# Patient Record
Sex: Female | Born: 1970 | Race: White | Hispanic: No | Marital: Married | Smoking: Never smoker
Health system: Southern US, Community
[De-identification: ages and names within clinical notes are randomized; demographics above are authoritative.]

## PROBLEM LIST (undated history)

## (undated) DIAGNOSIS — U071 COVID-19: Secondary | ICD-10-CM

## (undated) DIAGNOSIS — R51 Headache: Secondary | ICD-10-CM

## (undated) DIAGNOSIS — J45909 Unspecified asthma, uncomplicated: Secondary | ICD-10-CM

## (undated) DIAGNOSIS — M549 Dorsalgia, unspecified: Secondary | ICD-10-CM

## (undated) DIAGNOSIS — N946 Dysmenorrhea, unspecified: Secondary | ICD-10-CM

## (undated) DIAGNOSIS — T7840XA Allergy, unspecified, initial encounter: Secondary | ICD-10-CM

## (undated) DIAGNOSIS — E538 Deficiency of other specified B group vitamins: Secondary | ICD-10-CM

## (undated) DIAGNOSIS — K589 Irritable bowel syndrome without diarrhea: Secondary | ICD-10-CM

## (undated) DIAGNOSIS — D649 Anemia, unspecified: Secondary | ICD-10-CM

## (undated) DIAGNOSIS — E559 Vitamin D deficiency, unspecified: Secondary | ICD-10-CM

## (undated) DIAGNOSIS — M722 Plantar fascial fibromatosis: Secondary | ICD-10-CM

## (undated) DIAGNOSIS — F419 Anxiety disorder, unspecified: Secondary | ICD-10-CM

## (undated) DIAGNOSIS — M1990 Unspecified osteoarthritis, unspecified site: Secondary | ICD-10-CM

## (undated) DIAGNOSIS — K5900 Constipation, unspecified: Secondary | ICD-10-CM

## (undated) DIAGNOSIS — F32A Depression, unspecified: Secondary | ICD-10-CM

## (undated) DIAGNOSIS — M791 Myalgia, unspecified site: Secondary | ICD-10-CM

## (undated) DIAGNOSIS — M940 Chondrocostal junction syndrome [Tietze]: Secondary | ICD-10-CM

## (undated) DIAGNOSIS — R600 Localized edema: Secondary | ICD-10-CM

## (undated) DIAGNOSIS — R0602 Shortness of breath: Secondary | ICD-10-CM

## (undated) DIAGNOSIS — R002 Palpitations: Secondary | ICD-10-CM

## (undated) DIAGNOSIS — R519 Headache, unspecified: Secondary | ICD-10-CM

## (undated) DIAGNOSIS — E039 Hypothyroidism, unspecified: Secondary | ICD-10-CM

## (undated) DIAGNOSIS — S63519A Sprain of carpal joint of unspecified wrist, initial encounter: Secondary | ICD-10-CM

## (undated) DIAGNOSIS — K219 Gastro-esophageal reflux disease without esophagitis: Secondary | ICD-10-CM

## (undated) DIAGNOSIS — L309 Dermatitis, unspecified: Secondary | ICD-10-CM

## (undated) DIAGNOSIS — C801 Malignant (primary) neoplasm, unspecified: Secondary | ICD-10-CM

## (undated) DIAGNOSIS — F329 Major depressive disorder, single episode, unspecified: Secondary | ICD-10-CM

## (undated) HISTORY — PX: OTHER SURGICAL HISTORY: SHX169

## (undated) HISTORY — DX: Myalgia, unspecified site: M79.10

## (undated) HISTORY — DX: Anxiety disorder, unspecified: F41.9

## (undated) HISTORY — DX: Vitamin D deficiency, unspecified: E55.9

## (undated) HISTORY — DX: Chondrocostal junction syndrome (tietze): M94.0

## (undated) HISTORY — DX: Allergy, unspecified, initial encounter: T78.40XA

## (undated) HISTORY — DX: Gastro-esophageal reflux disease without esophagitis: K21.9

## (undated) HISTORY — DX: Constipation, unspecified: K59.00

## (undated) HISTORY — DX: Palpitations: R00.2

## (undated) HISTORY — PX: TONSILLECTOMY: SUR1361

## (undated) HISTORY — DX: Shortness of breath: R06.02

## (undated) HISTORY — DX: Plantar fascial fibromatosis: M72.2

## (undated) HISTORY — DX: Depression, unspecified: F32.A

## (undated) HISTORY — DX: Hypothyroidism, unspecified: E03.9

## (undated) HISTORY — DX: Unspecified osteoarthritis, unspecified site: M19.90

## (undated) HISTORY — DX: Deficiency of other specified B group vitamins: E53.8

## (undated) HISTORY — DX: Dysmenorrhea, unspecified: N94.6

## (undated) HISTORY — DX: Sprain of carpal joint of unspecified wrist, initial encounter: S63.519A

## (undated) HISTORY — DX: Dorsalgia, unspecified: M54.9

## (undated) HISTORY — DX: Localized edema: R60.0

## (undated) HISTORY — DX: Dermatitis, unspecified: L30.9

## (undated) HISTORY — DX: Major depressive disorder, single episode, unspecified: F32.9

---
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
## 2004-08-08 ENCOUNTER — Ambulatory Visit: Payer: Self-pay | Admitting: Otolaryngology

## 2007-03-27 HISTORY — PX: CHOLECYSTECTOMY: SHX55

## 2008-01-09 ENCOUNTER — Ambulatory Visit: Payer: Self-pay | Admitting: General Surgery

## 2008-01-12 ENCOUNTER — Ambulatory Visit: Payer: Self-pay | Admitting: General Surgery

## 2008-01-15 ENCOUNTER — Ambulatory Visit: Payer: Self-pay | Admitting: General Surgery

## 2008-03-26 DIAGNOSIS — C801 Malignant (primary) neoplasm, unspecified: Secondary | ICD-10-CM

## 2008-03-26 HISTORY — DX: Malignant (primary) neoplasm, unspecified: C80.1

## 2008-03-26 HISTORY — PX: SKIN GRAFT: SHX250

## 2011-10-14 ENCOUNTER — Ambulatory Visit: Payer: Self-pay | Admitting: Internal Medicine

## 2011-10-18 ENCOUNTER — Ambulatory Visit: Payer: Self-pay | Admitting: Internal Medicine

## 2011-10-20 ENCOUNTER — Ambulatory Visit: Payer: Self-pay | Admitting: Internal Medicine

## 2013-07-24 LAB — HM PAP SMEAR: HM PAP: NEGATIVE

## 2013-08-14 ENCOUNTER — Ambulatory Visit: Payer: Self-pay

## 2014-08-25 ENCOUNTER — Telehealth: Payer: Self-pay

## 2014-08-25 NOTE — Telephone Encounter (Signed)
Pt advised regarding test  Result and also notify reg endocrinology referral they will notify pt with appt date and time [facility] NP

## 2014-09-08 ENCOUNTER — Telehealth: Payer: Self-pay | Admitting: Family Medicine

## 2014-09-08 NOTE — Telephone Encounter (Signed)
Pt called stating she was needing her allergy shots approval sent to [facility]/Dr. Beverly Gust.  [address] [place], [place] Location Phone: [phone]

## 2014-09-09 NOTE — Telephone Encounter (Signed)
Got approval for 6 more visit.

## 2014-09-09 NOTE — Telephone Encounter (Signed)
Pt  Called  Back  Today states  That   She   Get shots  Once a week   Wed or Thurs.   Was  Approved for  6 visit

## 2014-10-15 ENCOUNTER — Telehealth: Payer: Self-pay | Admitting: Family Medicine

## 2014-10-15 NOTE — Telephone Encounter (Signed)
Spoke with [name] they lost paperwork faxed in 08/2014 so re faxed all paperwork and they will contact pt with appointment date and time and also called and advised pt to give them call if she doesn't hear anything from them. Nisha

## 2014-10-15 NOTE — Telephone Encounter (Signed)
Pt called to check the status of referral to [facility] endocrinology.  Pleases call

## 2014-10-18 ENCOUNTER — Telehealth: Payer: Self-pay | Admitting: Family Medicine

## 2014-10-18 NOTE — Telephone Encounter (Signed)
Barbara from Dr. Joycie Peek office at [facility] needs to know the reason for the referral to endocrinology.  Please call [phone]

## 2014-10-18 NOTE — Telephone Encounter (Signed)
The reason given to [facility]

## 2014-11-25 ENCOUNTER — Telehealth: Payer: Self-pay | Admitting: Family Medicine

## 2014-11-25 NOTE — Telephone Encounter (Signed)
Referral number is [id number] done for this month.

## 2014-11-25 NOTE — Telephone Encounter (Signed)
Pt called states that she need approval for her Allergy  Shots  Pt have  [place]. Pt  Call back # is  [phone]

## 2014-12-09 ENCOUNTER — Other Ambulatory Visit: Payer: Self-pay | Admitting: Internal Medicine

## 2014-12-09 DIAGNOSIS — E038 Other specified hypothyroidism: Secondary | ICD-10-CM

## 2014-12-14 ENCOUNTER — Ambulatory Visit: Payer: Self-pay

## 2014-12-14 ENCOUNTER — Other Ambulatory Visit: Payer: Self-pay

## 2014-12-15 ENCOUNTER — Other Ambulatory Visit: Payer: Self-pay

## 2014-12-20 ENCOUNTER — Encounter
Admission: RE | Admit: 2014-12-20 | Discharge: 2014-12-20 | Disposition: A | Discharge status: Home / Self Care | Payer: 59 | Source: Ambulatory Visit | Attending: Internal Medicine | Admitting: Internal Medicine

## 2014-12-20 DIAGNOSIS — E039 Hypothyroidism, unspecified: Secondary | ICD-10-CM | POA: Insufficient documentation

## 2014-12-20 DIAGNOSIS — E038 Other specified hypothyroidism: Secondary | ICD-10-CM

## 2014-12-20 MED ORDER — SODIUM IODIDE I-123 3.7 MBQ PO CAPS
150.8500 | ORAL_CAPSULE | Freq: Once | ORAL | Status: AC
  Administered 2014-12-20: 150.85 via ORAL

## 2014-12-21 ENCOUNTER — Encounter
Admission: RE | Admit: 2014-12-21 | Discharge: 2014-12-21 | Disposition: A | Discharge status: Home / Self Care | Payer: 59 | Source: Ambulatory Visit | Attending: Internal Medicine | Admitting: Internal Medicine

## 2014-12-21 DIAGNOSIS — E039 Hypothyroidism, unspecified: Secondary | ICD-10-CM | POA: Diagnosis present

## 2014-12-21 HISTORY — DX: Unspecified asthma, uncomplicated: J45.909

## 2014-12-21 HISTORY — DX: Malignant (primary) neoplasm, unspecified: C80.1

## 2014-12-29 ENCOUNTER — Other Ambulatory Visit: Payer: Self-pay | Admitting: Internal Medicine

## 2014-12-29 DIAGNOSIS — E038 Other specified hypothyroidism: Secondary | ICD-10-CM

## 2014-12-30 ENCOUNTER — Telehealth: Payer: Self-pay | Admitting: Family Medicine

## 2014-12-30 NOTE — Telephone Encounter (Signed)
Pt called requesting  Authorization for her allergy shots . Pt call back # is  [phone]

## 2014-12-30 NOTE — Telephone Encounter (Signed)
Referral has been submitted number is [id number].

## 2015-01-04 ENCOUNTER — Ambulatory Visit
Admission: RE | Admit: 2015-01-04 | Discharge: 2015-01-04 | Disposition: A | Discharge status: Home / Self Care | Payer: 59 | Source: Ambulatory Visit | Attending: Internal Medicine | Admitting: Internal Medicine

## 2015-01-04 DIAGNOSIS — G935 Compression of brain: Secondary | ICD-10-CM | POA: Diagnosis not present

## 2015-01-04 DIAGNOSIS — E038 Other specified hypothyroidism: Secondary | ICD-10-CM

## 2015-01-04 MED ORDER — GADOBENATE DIMEGLUMINE 529 MG/ML IV SOLN
10.0000 mL | Freq: Once | INTRAVENOUS | Status: AC | PRN
  Administered 2015-01-04: 10 mL via INTRAVENOUS

## 2015-02-10 ENCOUNTER — Telehealth: Payer: Self-pay | Admitting: Family Medicine

## 2015-02-10 NOTE — Telephone Encounter (Signed)
Pt said she needed authorization from [facility] to do more allergy injections at [facility] with Dr. Tami Ribas.  Her call back number is [phone]

## 2015-02-11 NOTE — Telephone Encounter (Signed)
REF # [id number]

## 2015-06-21 ENCOUNTER — Ambulatory Visit (INDEPENDENT_AMBULATORY_CARE_PROVIDER_SITE_OTHER): Payer: BLUE CROSS/BLUE SHIELD | Admitting: Family Medicine

## 2015-06-21 ENCOUNTER — Other Ambulatory Visit: Payer: Self-pay | Admitting: Family Medicine

## 2015-06-21 ENCOUNTER — Encounter: Payer: Self-pay | Admitting: Family Medicine

## 2015-06-21 VITALS — BP 126/84 | HR 75 | Temp 98.7°F | Resp 16 | Ht 69.0 in | Wt 252.0 lb

## 2015-06-21 DIAGNOSIS — Z01419 Encounter for gynecological examination (general) (routine) without abnormal findings: Secondary | ICD-10-CM

## 2015-06-21 DIAGNOSIS — N926 Irregular menstruation, unspecified: Secondary | ICD-10-CM

## 2015-06-21 DIAGNOSIS — E038 Other specified hypothyroidism: Secondary | ICD-10-CM | POA: Insufficient documentation

## 2015-06-21 DIAGNOSIS — E669 Obesity, unspecified: Secondary | ICD-10-CM | POA: Diagnosis not present

## 2015-06-21 DIAGNOSIS — J301 Allergic rhinitis due to pollen: Secondary | ICD-10-CM

## 2015-06-21 DIAGNOSIS — J309 Allergic rhinitis, unspecified: Secondary | ICD-10-CM | POA: Insufficient documentation

## 2015-06-21 LAB — POCT URINE PREGNANCY: PREG TEST UR: NEGATIVE

## 2015-06-21 MED ORDER — NORETHINDRONE 0.35 MG PO TABS: 1.0000 | ORAL_TABLET | Freq: Every day | ORAL | Status: DC

## 2015-06-21 MED ORDER — FLUTICASONE PROPIONATE 50 MCG/ACT NA SUSP: 2.0000 | Freq: Every day | NASAL | Status: DC

## 2015-06-21 MED ORDER — PSEUDOEPHEDRINE HCL 60 MG PO TABS: 60.0000 mg | ORAL_TABLET | Freq: Three times a day (TID) | ORAL | Status: DC | PRN

## 2015-06-21 NOTE — Assessment & Plan Note (Signed)
Encouraged healthy eating and continued exercise to help get to a healthy weight.

## 2015-06-21 NOTE — Progress Notes (Signed)
Subjective:    Patient ID: Caitlin Barrett, female    DOB: Nov 17, 1970, 45 y.o.   MRN: [id number]  HPI: Caitlin Barrett is a 45 y.o. female presenting on 06/21/2015 for Annual Exam   HPI  Pt presents for well woman exam. Recently found to have hypothyroidism. Treated at [facility]. Last pap 2015- normal. Had 1 ASCUS pap- did a colpo- no biposy. All normal since that time. Some clear vaginal discharge. Has not had her cycle. Previous cycles 21-24days had been abnormal with change in thyroid medication. Taking micronor- has not missed any days. No pain during sex.  Had baseline mammogram last year- normal. Some BSE- no lumps bumps or changes.   Some sinus issues- some sinus HA. Having HA and nasal congestion. Has allergies. No using nasonex or flonase   Past Medical History  Diagnosis Date  . Asthma     exercised enduced  . Cancer (HCC) 2010    Basal cell skin cancer Lt eye, skin graft above Lt eye  . Hypothyroid   . Allergy    Family History  Problem Relation Age of Onset  . Hyperlipidemia Mother   . Hypertension Mother   . Cancer Father   . Heart disease Maternal Grandmother   . Heart disease Maternal Grandfather    Social History   Social History  . Marital Status: Married    Spouse Name: N/A  . Number of Children: N/A  . Years of Education: N/A   Occupational History  . Not on file.   Social History Main Topics  . Smoking status: Never Smoker   . Smokeless tobacco: Not on file  . Alcohol Use: Yes  . Drug Use: No  . Sexual Activity: Not on file   Other Topics Concern  . Not on file   Social History Narrative   Family History  Problem Relation Age of Onset  . Hyperlipidemia Mother   . Hypertension Mother   . Cancer Father   . Heart disease Maternal Grandmother   . Heart disease Maternal Grandfather    No current outpatient prescriptions on file prior to visit.   No current facility-administered medications on file prior to visit.     Review of Systems  Constitutional: Negative for fever and chills.  HENT: Positive for congestion, postnasal drip and rhinorrhea.   Respiratory: Negative for cough, chest tightness and wheezing.   Cardiovascular: Negative for chest pain and leg swelling.  Gastrointestinal: Negative for nausea, vomiting, abdominal pain, diarrhea and constipation.  Endocrine: Negative.  Negative for cold intolerance, heat intolerance, polydipsia, polyphagia and polyuria.  Genitourinary: Positive for menstrual problem (missed period). Negative for dysuria, vaginal bleeding, vaginal discharge, difficulty urinating, vaginal pain and pelvic pain.  Musculoskeletal: Negative.   Neurological: Positive for headaches. Negative for dizziness, light-headedness and numbness.  Psychiatric/Behavioral: Negative.    Per HPI unless specifically indicated above     Objective:    BP 126/84 mmHg  Pulse 75  Temp(Src) 98.7 F (37.1 C) (Oral)  Resp 16  Ht 5\' 9"  (1.753 m)  Wt 252 lb (114.306 kg)  BMI 37.20 kg/m2  Wt Readings from Last 3 Encounters:  06/21/15 252 lb (114.306 kg)    Depression screen PHQ 2/9 06/21/2015  Decreased Interest 0  Down, Depressed, Hopeless 0  PHQ - 2 Score 0     Physical Exam  Constitutional: She is oriented to person, place, and time. She appears well-developed and well-nourished.  HENT:  Head: Normocephalic and atraumatic.  Right Ear:  Hearing and tympanic membrane normal.  Left Ear: Hearing normal. Tympanic membrane is scarred.  Mouth/Throat: Oropharynx is clear and moist and mucous membranes are normal.  Boggy nasal turbinates  Neck: Neck supple. No thyromegaly present.  Cardiovascular: Normal rate, regular rhythm and normal heart sounds.  Exam reveals no gallop and no friction rub.   No murmur heard. Pulmonary/Chest: Effort normal and breath sounds normal. She has no wheezes. She has no rhonchi. She has no rales. She exhibits no tenderness. Right breast exhibits no inverted  nipple, no mass, no nipple discharge, no skin change and no tenderness. Left breast exhibits no inverted nipple, no mass, no nipple discharge, no skin change and no tenderness.  Abdominal: Soft. Normal appearance and bowel sounds are normal. She exhibits no distension and no mass. There is no tenderness. There is no rebound and no guarding.  Genitourinary: Vagina normal and uterus normal. No breast swelling, tenderness, discharge or bleeding. There is no rash, lesion or injury on the right labia. There is no rash, lesion or injury on the left labia. Cervix exhibits no motion tenderness. Right adnexum displays no mass, no tenderness and no fullness. Left adnexum displays no mass, no tenderness and no fullness. No erythema or tenderness in the vagina. No vaginal discharge found.  Musculoskeletal: Normal range of motion. She exhibits no edema or tenderness.  Lymphadenopathy:    She has no cervical adenopathy.  Neurological: She is alert and oriented to person, place, and time.  Skin: Skin is warm and dry.   Results for orders placed or performed in visit on 06/21/15  HM PAP SMEAR  Result Value Ref Range   HM Pap smear negative   POCT urine pregnancy  Result Value Ref Range   Preg Test, Ur Negative Negative      Assessment & Plan:   Problem List Items Addressed This Visit      Respiratory   Allergic rhinitis    Start daily flonase in addition to allergy pill. Trial of sudafed to help with sinus symptoms. Encouraged saline rinses to help with allergy symptoms.       Relevant Medications   fluticasone (FLONASE) 50 MCG/ACT nasal spray   pseudoephedrine (SUDAFED) 60 MG tablet     Other   Obesity (BMI 30-39.9)    Encouraged healthy eating and continued exercise to help get to a healthy weight.        Other Visit Diagnoses    Well woman exam with routine gynecological exam    -  Primary    Next pap due 2018. Declines mammo this year.     Missed period        Likley irregular due to  progesterone only pill. Negative urine pregnancy. Pt will follow-up if she continues not to cycle.     Relevant Orders    POCT urine pregnancy (Completed)       Meds ordered this encounter  Medications  . EPINEPHrine 0.3 mg/0.3 mL IJ SOAJ injection    Sig:   . fluticasone (FLONASE) 50 MCG/ACT nasal spray    Sig: Place 2 sprays into both nostrils daily.    Dispense:  16 g    Refill:  11    Order Specific Question:  Supervising Provider    Answer:  Janeann Forehand [phone]  . pseudoephedrine (SUDAFED) 60 MG tablet    Sig: Take 1 tablet (60 mg total) by mouth every 8 (eight) hours as needed for congestion.    Dispense:  30 tablet  Refill:  0    Order Specific Question:  Supervising Provider    Answer:  Janeann Forehand [[id number]]      Follow up plan: No Follow-up on file.

## 2015-06-21 NOTE — Assessment & Plan Note (Signed)
Start daily flonase in addition to allergy pill. Trial of sudafed to help with sinus symptoms. Encouraged saline rinses to help with allergy symptoms.

## 2015-06-21 NOTE — Patient Instructions (Addendum)
Try flonase and sudafed for sinus symptoms x2 days, if not improving, please let me know.   Health Maintenance, Female Adopting a healthy lifestyle and getting preventive care can go a long way to promote health and wellness. Talk with your health care provider about what schedule of regular examinations is right for you. This is a good chance for you to check in with your provider about disease prevention and staying healthy. In between checkups, there are plenty of things you can do on your own. Experts have done a lot of research about which lifestyle changes and preventive measures are most likely to keep you healthy. Ask your health care provider for more information. WEIGHT AND DIET  Eat a healthy diet  Be sure to include plenty of vegetables, fruits, low-fat dairy products, and lean protein.  Do not eat a lot of foods high in solid fats, added sugars, or salt.  Get regular exercise. This is one of the most important things you can do for your health.  Most adults should exercise for at least 150 minutes each week. The exercise should increase your heart rate and make you sweat (moderate-intensity exercise).  Most adults should also do strengthening exercises at least twice a week. This is in addition to the moderate-intensity exercise.  Maintain a healthy weight  Body mass index (BMI) is a measurement that can be used to identify possible weight problems. It estimates body fat based on height and weight. Your health care provider can help determine your BMI and help you achieve or maintain a healthy weight.  For females 77 years of age and older:   A BMI below 18.5 is considered underweight.  A BMI of 18.5 to 24.9 is normal.  A BMI of 25 to 29.9 is considered overweight.  A BMI of 30 and above is considered obese.  Watch levels of cholesterol and blood lipids  You should start having your blood tested for lipids and cholesterol at 43 years of age, then have this test every 5  years.  You may need to have your cholesterol levels checked more often if:  Your lipid or cholesterol levels are high.  You are older than 43 years of age.  You are at high risk for heart disease.  CANCER SCREENING   Lung Cancer  Lung cancer screening is recommended for adults 10-88 years old who are at high risk for lung cancer because of a history of smoking.  A yearly low-dose CT scan of the lungs is recommended for people who:  Currently smoke.  Have quit within the past 15 years.  Have at least a 30-pack-year history of smoking. A pack year is smoking an average of one pack of cigarettes a day for 1 year.  Yearly screening should continue until it has been 15 years since you quit.  Yearly screening should stop if you develop a health problem that would prevent you from having lung cancer treatment.  Breast Cancer  Practice breast self-awareness. This means understanding how your breasts normally appear and feel.  It also means doing regular breast self-exams. Let your health care provider know about any changes, no matter how small.  If you are in your 20s or 30s, you should have a clinical breast exam (CBE) by a health care provider every 1-3 years as part of a regular health exam.  If you are 77 or older, have a CBE every year. Also consider having a breast X-ray (mammogram) every year.  If you have  a family history of breast cancer, talk to your health care provider about genetic screening.  If you are at high risk for breast cancer, talk to your health care provider about having an MRI and a mammogram every year.  Breast cancer gene (BRCA) assessment is recommended for women who have family members with BRCA-related cancers. BRCA-related cancers include:  Breast.  Ovarian.  Tubal.  Peritoneal cancers.  Results of the assessment will determine the need for genetic counseling and BRCA1 and BRCA2 testing. Cervical Cancer Your health care provider may  recommend that you be screened regularly for cancer of the pelvic organs (ovaries, uterus, and vagina). This screening involves a pelvic examination, including checking for microscopic changes to the surface of your cervix (Pap test). You may be encouraged to have this screening done every 3 years, beginning at age 21.  For women ages 30-65, health care providers may recommend pelvic exams and Pap testing every 3 years, or they may recommend the Pap and pelvic exam, combined with testing for human papilloma virus (HPV), every 5 years. Some types of HPV increase your risk of cervical cancer. Testing for HPV may also be done on women of any age with unclear Pap test results.  Other health care providers may not recommend any screening for nonpregnant women who are considered low risk for pelvic cancer and who do not have symptoms. Ask your health care provider if a screening pelvic exam is right for you.  If you have had past treatment for cervical cancer or a condition that could lead to cancer, you need Pap tests and screening for cancer for at least 20 years after your treatment. If Pap tests have been discontinued, your risk factors (such as having a new sexual partner) need to be reassessed to determine if screening should resume. Some women have medical problems that increase the chance of getting cervical cancer. In these cases, your health care provider may recommend more frequent screening and Pap tests. Colorectal Cancer  This type of cancer can be detected and often prevented.  Routine colorectal cancer screening usually begins at 43 years of age and continues through 43 years of age.  Your health care provider may recommend screening at an earlier age if you have risk factors for colon cancer.  Your health care provider may also recommend using home test kits to check for hidden blood in the stool.  A small camera at the end of a tube can be used to examine your colon directly  (sigmoidoscopy or colonoscopy). This is done to check for the earliest forms of colorectal cancer.  Routine screening usually begins at age 50.  Direct examination of the colon should be repeated every 5-10 years through 43 years of age. However, you may need to be screened more often if early forms of precancerous polyps or small growths are found. Skin Cancer  Check your skin from head to toe regularly.  Tell your health care provider about any new moles or changes in moles, especially if there is a change in a mole's shape or color.  Also tell your health care provider if you have a mole that is larger than the size of a pencil eraser.  Always use sunscreen. Apply sunscreen liberally and repeatedly throughout the day.  Protect yourself by wearing long sleeves, pants, a wide-brimmed hat, and sunglasses whenever you are outside. HEART DISEASE, DIABETES, AND HIGH BLOOD PRESSURE   High blood pressure causes heart disease and increases the risk of stroke. High   blood pressure is more likely to develop in:  People who have blood pressure in the high end of the normal range (130-139/85-89 mm Hg).  People who are overweight or obese.  People who are African American.  If you are 18-39 years of age, have your blood pressure checked every 3-5 years. If you are 40 years of age or older, have your blood pressure checked every year. You should have your blood pressure measured twice--once when you are at a hospital or clinic, and once when you are not at a hospital or clinic. Record the average of the two measurements. To check your blood pressure when you are not at a hospital or clinic, you can use:  An automated blood pressure machine at a pharmacy.  A home blood pressure monitor.  If you are between 55 years and 79 years old, ask your health care provider if you should take aspirin to prevent strokes.  Have regular diabetes screenings. This involves taking a blood sample to check your  fasting blood sugar level.  If you are at a normal weight and have a low risk for diabetes, have this test once every three years after 43 years of age.  If you are overweight and have a high risk for diabetes, consider being tested at a younger age or more often. PREVENTING INFECTION  Hepatitis B  If you have a higher risk for hepatitis B, you should be screened for this virus. You are considered at high risk for hepatitis B if:  You were born in a country where hepatitis B is common. Ask your health care provider which countries are considered high risk.  Your parents were born in a high-risk country, and you have not been immunized against hepatitis B (hepatitis B vaccine).  You have HIV or AIDS.  You use needles to inject street drugs.  You live with someone who has hepatitis B.  You have had sex with someone who has hepatitis B.  You get hemodialysis treatment.  You take certain medicines for conditions, including cancer, organ transplantation, and autoimmune conditions. Hepatitis C  Blood testing is recommended for:  Everyone born from 1945 through 1965.  Anyone with known risk factors for hepatitis C. Sexually transmitted infections (STIs)  You should be screened for sexually transmitted infections (STIs) including gonorrhea and chlamydia if:  You are sexually active and are younger than 43 years of age.  You are older than 43 years of age and your health care provider tells you that you are at risk for this type of infection.  Your sexual activity has changed since you were last screened and you are at an increased risk for chlamydia or gonorrhea. Ask your health care provider if you are at risk.  If you do not have HIV, but are at risk, it may be recommended that you take a prescription medicine daily to prevent HIV infection. This is called pre-exposure prophylaxis (PrEP). You are considered at risk if:  You are sexually active and do not regularly use condoms or  know the HIV status of your partner(s).  You take drugs by injection.  You are sexually active with a partner who has HIV. Talk with your health care provider about whether you are at high risk of being infected with HIV. If you choose to begin PrEP, you should first be tested for HIV. You should then be tested every 3 months for as long as you are taking PrEP.  PREGNANCY   If you are   If you are premenopausal and you may become pregnant, ask your health care provider about preconception counseling.  If you may become pregnant, take 400 to 800 micrograms (mcg) of folic acid every day.  If you want to prevent pregnancy, talk to your health care provider about birth control (contraception). OSTEOPOROSIS AND MENOPAUSE   Osteoporosis is a disease in which the bones lose minerals and strength with aging. This can result in serious bone fractures. Your risk for osteoporosis can be identified using a bone density scan.  If you are 65 years of age or older, or if you are at risk for osteoporosis and fractures, ask your health care provider if you should be screened.  Ask your health care provider whether you should take a calcium or vitamin D supplement to lower your risk for osteoporosis.  Menopause may have certain physical symptoms and risks.  Hormone replacement therapy may reduce some of these symptoms and risks. Talk to your health care provider about whether hormone replacement therapy is right for you.  HOME CARE INSTRUCTIONS   Schedule regular health, dental, and eye exams.  Stay current with your immunizations.   Do not use any tobacco products including cigarettes, chewing tobacco, or electronic cigarettes.  If you are pregnant, do not drink alcohol.  If you are breastfeeding, limit how much and how often you drink alcohol.  Limit alcohol intake to no more than 1 drink per day for nonpregnant women. One drink equals 12 ounces of beer, 5 ounces of wine, or 1 ounces of hard  liquor.  Do not use street drugs.  Do not share needles.  Ask your health care provider for help if you need support or information about quitting drugs.  Tell your health care provider if you often feel depressed.  Tell your health care provider if you have ever been abused or do not feel safe at home.   This information is not intended to replace advice given to you by your health care provider. Make sure you discuss any questions you have with your health care provider.   Document Released: 09/25/2010 Document Revised: 04/02/2014 Document Reviewed: 02/11/2013 Elsevier Interactive Patient Education 2016 [employer]vier Inc.  

## 2015-06-24 ENCOUNTER — Telehealth: Payer: Self-pay | Admitting: Family Medicine

## 2015-06-24 DIAGNOSIS — J0101 Acute recurrent maxillary sinusitis: Secondary | ICD-10-CM

## 2015-06-24 MED ORDER — AMOXICILLIN-POT CLAVULANATE 875-125 MG PO TABS: 1.0000 | ORAL_TABLET | Freq: Two times a day (BID) | ORAL | Status: DC

## 2015-06-24 NOTE — Telephone Encounter (Signed)
Pt was in earlier this week and was told if she needed an antibiotic she could call back.  She asked to have something called in to [facility].  Her call back number is [phone]

## 2015-06-24 NOTE — Telephone Encounter (Signed)
Sure. We were trying home treatment first. I have sent in Augmentin twice daily for 5 days to [facility]. Thanks! AK

## 2015-07-05 ENCOUNTER — Telehealth: Payer: Self-pay | Admitting: Family Medicine

## 2015-07-05 NOTE — Telephone Encounter (Signed)
Left detailed message.   

## 2015-07-05 NOTE — Telephone Encounter (Signed)
Pt needs a refill on Micro Nor sent to [facility].  Her call back number is [phone]

## 2015-07-05 NOTE — Telephone Encounter (Signed)
Please let her know this refill was sent to [facility] on 06/21/2015. Per our system, it looks like it went through. Thanks! AK

## 2015-07-21 ENCOUNTER — Encounter: Payer: Self-pay | Admitting: Family Medicine

## 2015-07-21 ENCOUNTER — Ambulatory Visit: Payer: BLUE CROSS/BLUE SHIELD | Admitting: Family Medicine

## 2015-07-21 ENCOUNTER — Ambulatory Visit (INDEPENDENT_AMBULATORY_CARE_PROVIDER_SITE_OTHER): Payer: BLUE CROSS/BLUE SHIELD | Admitting: Family Medicine

## 2015-07-21 VITALS — BP 128/65 | HR 76 | Temp 98.6°F | Resp 16 | Ht 69.0 in | Wt 252.4 lb

## 2015-07-21 DIAGNOSIS — G5761 Lesion of plantar nerve, right lower limb: Secondary | ICD-10-CM

## 2015-07-21 DIAGNOSIS — R351 Nocturia: Secondary | ICD-10-CM | POA: Insufficient documentation

## 2015-07-21 DIAGNOSIS — D487 Neoplasm of uncertain behavior of other specified sites: Secondary | ICD-10-CM

## 2015-07-21 DIAGNOSIS — G5760 Lesion of plantar nerve, unspecified lower limb: Secondary | ICD-10-CM | POA: Insufficient documentation

## 2015-07-21 DIAGNOSIS — N393 Stress incontinence (female) (male): Secondary | ICD-10-CM | POA: Diagnosis not present

## 2015-07-21 NOTE — Assessment & Plan Note (Signed)
Likely 2/2 fluid intake and consumption of large amounts tea and caffeine. UA is negative for infection. Encouraged drinking mainly water. Limit fluids before bedtime. Decrease caffeine. Consider pelvic floor rehab if continues.

## 2015-07-21 NOTE — Assessment & Plan Note (Signed)
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 Continue orthotics and arch support. Refer to podiatry.

## 2015-07-21 NOTE — Progress Notes (Signed)
Subjective:    Patient ID: Caitlin Barrett, female    DOB: 1970-12-27, 45 y.o.   MRN: [id number]  HPI: [name] is a 45 y.o. female presenting on 07/21/2015 for Foot Pain and Nocturia   HPI  Pt presents for R foots pan. Ran 1/2 marathon recently and noticed pain between 3rd and 4th toes. Feet peeled after race- and now she is having pain even when not wearing shoes. Feels like stepping on a rock when walking. Only painful when bearing weight on the foot. Also having R heel pain- lost all the skin after her race. Tender to touch. Some burning sensation.  Frequent urination at night: Started a few months ago. Now going more frequently during the day. Feels like she is drinking more fluids. If she limits fluids before bedtime- she will not go to the bathroom at night. Getting up 1-2 times per night. No burning with urination. No pelvic pain or flank pain. No foul smell to urine. No blood. Drinks 160z of soda per day. Has sweet tea- 64 oz daily.  Pt has history of precancerous cells. Is concerned about 2 lesions on the back of her neck. One lesion is scaly and itchy at time. Flakes off and becomes smooth.   Past Medical History  Diagnosis Date  . Asthma     exercised enduced  . Cancer (HCC) 2010    Basal cell skin cancer Lt eye, skin graft above Lt eye  . Hypothyroid   . Allergy     Current Outpatient Prescriptions on File Prior to Visit  Medication Sig  . chlorpheniramine (CHLOR-TRIMETON) 4 MG tablet Take 1 tablet by mouth daily.  Marland Kitchen EPINEPHrine 0.3 mg/0.3 mL IJ SOAJ injection   . fluticasone (FLONASE) 50 MCG/ACT nasal spray Place 2 sprays into both nostrils daily.  Marland Kitchen levothyroxine (SYNTHROID, LEVOTHROID) 50 MCG tablet Take 1 tablet by mouth daily.  . norethindrone (MICRONOR,CAMILA,ERRIN) 0.35 MG tablet Take 1 tablet (0.35 mg total) by mouth daily.  . pseudoephedrine (SUDAFED) 60 MG tablet Take 1 tablet (60 mg total) by mouth every 8 (eight) hours as needed for congestion.   No  current facility-administered medications on file prior to visit.    Review of Systems  Constitutional: Negative for fever and chills.  HENT: Negative.   Respiratory: Negative for cough, chest tightness and wheezing.   Cardiovascular: Negative for chest pain and leg swelling.  Gastrointestinal: Negative for nausea, vomiting, abdominal pain, diarrhea and constipation.  Endocrine: Negative.  Negative for cold intolerance, heat intolerance, polydipsia, polyphagia and polyuria.  Genitourinary: Positive for urgency. Negative for dysuria and difficulty urinating.       Stress incontinence  Musculoskeletal: Positive for arthralgias (foot pain).  Skin: Positive for color change.  Neurological: Negative for dizziness, light-headedness and numbness.  Psychiatric/Behavioral: Negative.    Per HPI unless specifically indicated above     Objective:    BP 128/65 mmHg  Pulse 76  Temp(Src) 98.6 F (37 C) (Oral)  Resp 16  Ht 5\' 9"  (1.753 m)  Wt 252 lb 6.4 oz (114.488 kg)  BMI 37.26 kg/m2  SpO2 99%  Wt Readings from Last 3 Encounters:  07/21/15 252 lb 6.4 oz (114.488 kg)  06/21/15 252 lb (114.306 kg)    Physical Exam  Constitutional: She is oriented to person, place, and time. She appears well-developed and well-nourished.  HENT:  Head: Normocephalic and atraumatic.  Neck: Neck supple.  Cardiovascular: Normal rate, regular rhythm and normal heart sounds.  Exam reveals  no gallop and no friction rub.   No murmur heard. Pulmonary/Chest: Effort normal and breath sounds normal. She has no wheezes. She exhibits no tenderness.  Abdominal: Soft. Normal appearance and bowel sounds are normal. She exhibits no distension and no mass. There is no tenderness. There is no rebound, no guarding and no CVA tenderness.  Musculoskeletal: Normal range of motion. She exhibits no edema.       Right foot: There is tenderness and bony tenderness. There is normal range of motion, no swelling, normal capillary  refill and no laceration.       Left foot: Normal. There is normal range of motion, no tenderness and no laceration.  Area of tenderness between 3rd and floor metarsal head. Callus just above and lateral surface of the great toe.  Area of tenderness on the heel   Lymphadenopathy:    She has no cervical adenopathy.  Neurological: She is alert and oriented to person, place, and time.  Skin: Skin is warm and dry. Lesion noted.  Small papular lesion on dorsal surface of the neck that is pink, smooth, non tender with irregular borders.     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 Continue orthotics and arch support. Refer to podiatry.      Relevant Orders   Ambulatory referral to Podiatry     Other   Nocturia    Likely 2/2 fluid intake and consumption of large amounts tea and caffeine. UA is negative for infection. Encouraged drinking mainly water. Limit fluids before bedtime. Decrease caffeine. Consider pelvic floor rehab if continues.       Relevant Orders   POCT urinalysis dipstick   Stress incontinence    Encouraged kegel exercises. Limit caffeine. Recheck 1 mos.        Other Visit Diagnoses    Neoplasm of uncertain behavior of neck           No orders of the defined types were placed in this encounter.      Follow up plan: Return in about 4 weeks (around 08/18/2015) for bladder.

## 2015-07-21 NOTE — Assessment & Plan Note (Signed)
Encouraged kegel exercises. Limit caffeine. Recheck 1 mos.

## 2015-07-21 NOTE — Patient Instructions (Addendum)
Nocturia: I think your issue it the amount of caffeine and tea you are drinking daily. Try reducing the amount to prevent bladder irritation. Drink mainly water. Continue to limit fluids before bedtime.  Try Kegel exercises for stress incontinence. If this doesn't help, we will have you go to pelvic floor rehab.   Please make an appt to have the lesion on your neck removed.   Kegel Exercises The goal of Kegel exercises is to isolate and exercise your pelvic floor muscles. These muscles act as a hammock that supports the rectum, vagina, small intestine, and uterus. As the muscles weaken, the hammock sags and these organs are displaced from their normal positions. Kegel exercises can strengthen your pelvic floor muscles and help you to improve bladder and bowel control, improve sexual response, and help reduce many problems and some discomfort during pregnancy. Kegel exercises can be done anywhere and at any time. HOW TO PERFORM KEGEL EXERCISES 1. Locate your pelvic floor muscles. To do this, squeeze (contract) the muscles that you use when you try to stop the flow of urine. You will feel a tightness in the vaginal area (women) and a tight lift in the rectal area (men and women). 2. When you begin, contract your pelvic muscles tight for 2-5 seconds, then relax them for 2-5 seconds. This is one set. Do 4-5 sets with a short pause in between. 3. Contract your pelvic muscles for 8-10 seconds, then relax them for 8-10 seconds. Do 4-5 sets. If you cannot contract your pelvic muscles for 8-10 seconds, try 5-7 seconds and work your way up to 8-10 seconds. Your goal is 4-5 sets of 10 contractions each day. Keep your stomach, buttocks, and legs relaxed during the exercises. Perform sets of both short and long contractions. Vary your positions. Perform these contractions 3-4 times per day. Perform sets while you are:   Lying in bed in the morning.  Standing at lunch.  Sitting in the late afternoon.  Lying in  bed at night. You should do 40-50 contractions per day. Do not perform more Kegel exercises per day than recommended. Overexercising can cause muscle fatigue. Continue these exercises for for at least 15-20 weeks or as directed by your caregiver.   This information is not intended to replace advice given to you by your health care provider. Make sure you discuss any questions you have with your health care provider.   Document Released: 02/27/2012 Document Revised: 04/02/2014 Document Reviewed: 02/27/2012 Elsevier Interactive Patient Education [employer].

## 2015-07-25 ENCOUNTER — Ambulatory Visit (INDEPENDENT_AMBULATORY_CARE_PROVIDER_SITE_OTHER): Payer: BLUE CROSS/BLUE SHIELD | Admitting: Family Medicine

## 2015-07-25 ENCOUNTER — Other Ambulatory Visit: Payer: Self-pay | Admitting: Family Medicine

## 2015-07-25 VITALS — BP 126/64 | HR 76 | Temp 98.6°F | Resp 16 | Ht 69.0 in | Wt 256.6 lb

## 2015-07-25 DIAGNOSIS — D485 Neoplasm of uncertain behavior of skin: Secondary | ICD-10-CM | POA: Diagnosis not present

## 2015-07-25 MED ORDER — LIDOCAINE-EPINEPHRINE 2 %-1:100000 IJ SOLN: 1.7000 mL | Freq: Once | INTRAMUSCULAR | Status: DC

## 2015-07-25 MED ORDER — LIDOCAINE HCL 1 % IJ SOLN
2.0000 mL | Freq: Once | INTRAMUSCULAR | Status: AC
  Administered 2015-07-25: 2 mL via INTRADERMAL

## 2015-07-25 NOTE — Progress Notes (Signed)
Subjective:    Patient ID: Caitlin Barrett, female    DOB: [birth year], 45 y.o.   MRN: [id number]  HPI: AINO LANZILLO is a 45 y.o. female presenting on 07/25/2015 for Nevus   HPI  Pt presents for lesion removal. Scaly lesion x a few months that gets irritated. Pt has history of basal cell carcinoma and is concerned. Presents for shave biopsy today. Verbally consented. Has allergy to iodine dye but has had topical iodine with no issues.   Past Medical History  Diagnosis Date  . Asthma     exercised enduced  . Cancer (HCC) 2010    Basal cell skin cancer Lt eye, skin graft above Lt eye  . Hypothyroid   . Allergy     Current Outpatient Prescriptions on File Prior to Visit  Medication Sig  . chlorpheniramine (CHLOR-TRIMETON) 4 MG tablet Take 1 tablet by mouth daily.  Marland Kitchen EPINEPHrine 0.3 mg/0.3 mL IJ SOAJ injection   . fluticasone (FLONASE) 50 MCG/ACT nasal spray Place 2 sprays into both nostrils daily.  Marland Kitchen levothyroxine (SYNTHROID, LEVOTHROID) 50 MCG tablet Take 1 tablet by mouth daily.  . norethindrone (MICRONOR,CAMILA,ERRIN) 0.35 MG tablet Take 1 tablet (0.35 mg total) by mouth daily.  . pseudoephedrine (SUDAFED) 60 MG tablet Take 1 tablet (60 mg total) by mouth every 8 (eight) hours as needed for congestion.   No current facility-administered medications on file prior to visit.    Review of Systems  Respiratory: Negative for cough, chest tightness and wheezing.   Cardiovascular: Negative for chest pain, palpitations and leg swelling.  Skin: Positive for color change.       Lesion upper back    Per HPI unless specifically indicated above     Objective:    BP 126/64 mmHg  Pulse 76  Temp(Src) 98.6 F (37 C) (Oral)  Resp 16  Ht 5\' 9"  (1.753 m)  Wt 256 lb 9.6 oz (116.393 kg)  BMI 37.88 kg/m2  Wt Readings from Last 3 Encounters:  07/25/15 256 lb 9.6 oz (116.393 kg)  07/21/15 252 lb 6.4 oz (114.488 kg)  06/21/15 252 lb (114.306 kg)    Physical Exam  Constitutional:  She appears well-developed and well-nourished.  Skin: Skin is warm and intact. Lesion (pink scaly lesion on upper back in midline. ) noted. She is not diaphoretic. No pallor.   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 Sent for pathology. Pt tolerated well. Bandaid with triple ointment applied. Home care instructions reviewed.     Relevant Medications    lidocaine-EPINEPHrine (XYLOCAINE W/EPI) 2 %-1:100000 (with pres) injection 1.7 mL    Other Relevant Orders    Dermatology pathology    PR SHAV SKIN LES < 0.5 CM TRUNK,ARM,LEG       Meds ordered this encounter  Medications  . lidocaine-EPINEPHrine (XYLOCAINE W/EPI) 2 %-1:100000 (with pres) injection 1.7 mL    Sig:       Follow up plan: Return if symptoms worsen or fail to improve.

## 2015-07-25 NOTE — Patient Instructions (Signed)
We will biopsy the lesion on your back. We will let you know the results.   Keep the area covered with band aid. Can apply neosporin if needed. Call with bleeding, purulent drainage.

## 2015-07-27 ENCOUNTER — Telehealth: Payer: Self-pay | Admitting: *Deleted

## 2015-07-27 NOTE — Telephone Encounter (Signed)
appt scheduled for 08/15/15 at 9 am arrive 15 mins prior to register.Hines

## 2015-07-28 ENCOUNTER — Telehealth: Payer: Self-pay | Admitting: Family Medicine

## 2015-07-28 NOTE — Telephone Encounter (Signed)
Given to [facility].

## 2015-07-28 NOTE — Telephone Encounter (Signed)
Caitlin Barrett at [facility] needs to verify the date of service for specimen [phone].  Please call client services [phone] option 2 then option 4.

## 2015-07-29 ENCOUNTER — Telehealth: Payer: Self-pay | Admitting: *Deleted

## 2015-07-29 NOTE — Telephone Encounter (Signed)
Okay; thanks.

## 2015-07-29 NOTE — Telephone Encounter (Signed)
[facility] has called at least 3 times this week to obtain information on specimen received. Date of specimen, location and if sample was intended to be performed by them. Caitlin Barrett has spoken with them several times this week I spoke to Caitlin Barrett and confirmed information. Per Caitlin Barrett nothing further needed.

## 2015-08-05 LAB — PATHOLOGY REPORT

## 2015-08-15 ENCOUNTER — Ambulatory Visit (INDEPENDENT_AMBULATORY_CARE_PROVIDER_SITE_OTHER): Payer: BLUE CROSS/BLUE SHIELD | Admitting: Podiatry

## 2015-08-15 ENCOUNTER — Ambulatory Visit (INDEPENDENT_AMBULATORY_CARE_PROVIDER_SITE_OTHER): Payer: BLUE CROSS/BLUE SHIELD

## 2015-08-15 ENCOUNTER — Encounter: Payer: Self-pay | Admitting: Podiatry

## 2015-08-15 VITALS — BP 142/90 | HR 69 | Resp 12

## 2015-08-15 DIAGNOSIS — M722 Plantar fascial fibromatosis: Secondary | ICD-10-CM

## 2015-08-15 DIAGNOSIS — Q828 Other specified congenital malformations of skin: Secondary | ICD-10-CM | POA: Diagnosis not present

## 2015-08-15 NOTE — Progress Notes (Signed)
She presents today with a chief complaint of painful heels 3 months. She states that they're worse first thing in the mornings. She's tried over-the-counter inserts to no avail and tried different shoes. She denies changes in her past medical history medications allergy surgery social history and review of systems.  Objective: Vital signs are stable alert and oriented 3. Pulses are palpable. Neurologic sensorium is intact per Semmes-Weinstein monofilament.. Deep tendon reflexes are intact. Muscle strength is normal bilateral. Orthopedic evaluation demonstrates multiple hammertoe deformities bilateral. Pain on palpation medial calcaneal tubercles bilateral. Mild hallux abductovalgus deformities bilateral. Radiographs taken today 3 views in the office demonstrate pes planus with hallux abductovalgus deformity bilateral. Plantar distally oriented calcaneal heel spur the soft tissue increase in density in the plantar fascia calcaneal insertion sites bilaterally. Cutaneous evaluation of Mr. supple well-hydrated cutis no erythema edema cellulitis drainage or odor.  Assessment: Multiple porokeratosis bilateral plantar foot hammertoe deformities bilateral. Hallux abductovalgus bilateral. Plantar fasciitis bilateral.  Plan: Discussed etiology pathology conservative versus surgical therapies. Injected the bilateral heels today with Kenalog and local anesthetic turned her on a Medrol Dosepak to be followed by meloxicam. Discussed appropriate shoe gear stretching exercises ice therapy issue modifications. We discussed the use of occasional debridement tools to the areas of reactive hyperkeratoses. I debrided those for her today. I will follow-up with her in the near future.

## 2015-08-16 ENCOUNTER — Telehealth: Payer: Self-pay | Admitting: *Deleted

## 2015-08-16 ENCOUNTER — Telehealth: Payer: Self-pay | Admitting: Podiatry

## 2015-08-16 MED ORDER — MELOXICAM 15 MG PO TABS: 15.0000 mg | ORAL_TABLET | Freq: Every day | ORAL | Status: DC

## 2015-08-16 MED ORDER — METHYLPREDNISOLONE 4 MG PO TBPK: ORAL_TABLET | ORAL | Status: DC

## 2015-08-16 NOTE — Telephone Encounter (Signed)
Pt states rx not called to pharmacy.  I reviewed pt's 08/15/2015 orders and Dr. Milinda Pointer had ordered Medrol dose pack and Meloxicam to follow, but no pharmacy had been selected.  I left message for pt to call with pharmacy.  Dorisann Frames from the [facility] office states pt is at that office and the rx has not been called in.  I told T. Honeycutt, I would order now and did.  I called [facility] [phone] and they said both prescriptions had been received.

## 2015-08-16 NOTE — Telephone Encounter (Signed)
Pt was seen yesterday on May 23rd 2017 with dr. Milinda Pointer he was going to send in a RX but never sent it.

## 2015-08-16 NOTE — Telephone Encounter (Signed)
Pt did not have a pharmacy designated for rx to be ordered.  I will order the Medrol dose pack and Meloxicam as dictated by Dr. Milinda Pointer on 08/15/2015, once pt calls with the pharmacy.

## 2015-08-18 ENCOUNTER — Ambulatory Visit: Payer: BLUE CROSS/BLUE SHIELD | Admitting: Family Medicine

## 2015-09-12 ENCOUNTER — Telehealth: Payer: Self-pay | Admitting: *Deleted

## 2015-09-12 ENCOUNTER — Ambulatory Visit (INDEPENDENT_AMBULATORY_CARE_PROVIDER_SITE_OTHER): Payer: BLUE CROSS/BLUE SHIELD | Admitting: Podiatry

## 2015-09-12 ENCOUNTER — Encounter: Payer: Self-pay | Admitting: Podiatry

## 2015-09-12 VITALS — BP 132/90 | HR 69 | Resp 12

## 2015-09-12 DIAGNOSIS — M722 Plantar fascial fibromatosis: Secondary | ICD-10-CM | POA: Diagnosis not present

## 2015-09-12 MED ORDER — MELOXICAM 15 MG PO TABS: 15.0000 mg | ORAL_TABLET | Freq: Every day | ORAL | Status: DC

## 2015-09-12 NOTE — Progress Notes (Signed)
She presents today for follow-up of her plantar fasciitis. She states that she continues to wear plantar fascia braces or night splint and that her heels are proximally 50% improved.  Objective: Vital signs are stable she is alert and oriented 3 pulses are strongly palpable. Neurologic sensorium is intact. She has no pain on palpation medial calcaneal tubercles bilateral.  Assessment: Bunion plantar fasciitis greater 50%.  Plan: Continue use of all conservative therapies and follow up with me on an as-needed basis.

## 2015-09-12 NOTE — Patient Instructions (Signed)

## 2015-09-12 NOTE — Telephone Encounter (Signed)
Pt states she was seen today and was told to continue the Meloxicam but has no refills.  Dr. Milinda Pointer states refill +2refills.  Orders called to pharmacy and pt.

## 2016-04-16 ENCOUNTER — Encounter: Payer: Self-pay | Admitting: *Deleted

## 2016-04-18 ENCOUNTER — Other Ambulatory Visit (INDEPENDENT_AMBULATORY_CARE_PROVIDER_SITE_OTHER): Payer: BLUE CROSS/BLUE SHIELD

## 2016-04-18 ENCOUNTER — Encounter: Payer: Self-pay | Admitting: Obstetrics and Gynecology

## 2016-04-18 ENCOUNTER — Ambulatory Visit (INDEPENDENT_AMBULATORY_CARE_PROVIDER_SITE_OTHER): Payer: BLUE CROSS/BLUE SHIELD | Admitting: Obstetrics and Gynecology

## 2016-04-18 VITALS — BP 123/76 | HR 66 | Ht 68.0 in | Wt 239.7 lb

## 2016-04-18 DIAGNOSIS — D259 Leiomyoma of uterus, unspecified: Secondary | ICD-10-CM

## 2016-04-18 DIAGNOSIS — N938 Other specified abnormal uterine and vaginal bleeding: Secondary | ICD-10-CM

## 2016-04-18 NOTE — Progress Notes (Signed)
Subjective:     Patient ID: Caitlin Barrett, female   DOB: 1970-07-13, 46 y.o.   MRN: [id number]  HPI Reports irregular and heavy at times menstrual bleeding since 03/23/16. Is still taking Camilla OCP, and thyroid meds, states thyroid labs have been stable x 6 months. Known uterine fibroids. Also reports feeling fatigued over the last week or so. States last three days bleeding was heavy with large clots.  Last visit here with ultrasound was in 2015. Review of Systems Negative except stated in HPI    Objective:   Physical Exam A&O x4 Well groomed female in no distress Blood pressure 123/76, pulse 66, height 5' 8"  (1.727 m), weight 239 lb 11.2 oz (108.7 kg), last menstrual period 03/23/2016. Abdomen soft and non-tender Pelvic exam: normal external genitalia, vulva, vagina, cervix, uterus and adnexa, UTERUS: retroverted, irregular, enlarged.  Pelvic ultrasound reveals: Indications: DUB with history of fibroids Findings:  The uterus is slightly enlarged for parity and measures 9.3 x 5.3 x 5.4 cm. Echo texture is diffusely heterogenous with evidence of multiple focal masses. Within the uterus are multiple suspected fibroids measuring: Fibroid 1: 1.8 x 1.7 x 1.6 cm. This appears to be in the endometrium, submucosal. Fibroid 2:  2.1 x 2.3 x 2.2 cm. This too appears to be encroaching upon the endometrium, submucosal. Fibroid 3:  3.0 x 2.5 x 3.7 cm. This is posterior fundal and is subserosal. There are at least 3 more fibroids seen, all of which appear pedunculated to subserosal in nature.  The Endometrium measures 3.5 mm.  Right Ovary measures 2.4 x 1.2 x 1.8 cm, and appears WNL. Left Ovary measures 2.7 x 1.7 x 1.5 cm, and appears WNL. Survey of the adnexa demonstrates no adnexal masses. There is no free fluid in the cul de sac.  Impression: 1. Enlarged fibroid uterus with 2 potentially submucosal fibroids invading endometrium.     Assessment:     DUB Multiple uterine fibroids     Plan:     Discussed treatment options, including expectant management with f/u u/s in 3-6 months, embolization, lupron, and hysterectomy. Patient is interested in hysterectomy. Will RTC in 2-3 weeks to dicsuss with Dr Enzo Bi.  Lorelle Gibbs, CNM

## 2016-04-18 NOTE — Patient Instructions (Signed)
Uterine Fibroids Uterine fibroids are tissue masses (tumors) that can develop in the womb (uterus). They are also called leiomyomas. This type of tumor is not cancerous (benign) and does not spread to other parts of the body outside of the pelvic area, which is between the hip bones. Occasionally, fibroids may develop in the fallopian tubes, in the cervix, or on the support structures (ligaments) that surround the uterus. You can have one or many fibroids. Fibroids can vary in size, weight, and where they grow in the uterus. Some can become quite large. Most fibroids do not require medical treatment. What are the causes? A fibroid can develop when a single uterine cell keeps growing (replicating). Most cells in the human body have a control mechanism that keeps them from replicating without control. What are the signs or symptoms? Symptoms may include:  Heavy bleeding during your period.  Bleeding or spotting between periods.  Pelvic pain and pressure.  Bladder problems, such as needing to urinate more often (urinary frequency) or urgently.  Inability to reproduce offspring (infertility).  Miscarriages. How is this diagnosed? Uterine fibroids are diagnosed through a physical exam. Your health care provider may feel the lumpy tumors during a pelvic exam. Ultrasonography and an MRI may be done to determine the size, location, and number of fibroids. How is this treated? Treatment may include:  Watchful waiting. This involves getting the fibroid checked by your health care provider to see if it grows or shrinks. Follow your health care provider's recommendations for how often to have this checked.  Hormone medicines. These can be taken by mouth or given through an intrauterine device (IUD).  Surgery.  Removing the fibroids (myomectomy) or the uterus (hysterectomy).  Removing blood supply to the fibroids (uterine artery embolization). If fibroids interfere with your fertility and you  want to become pregnant, your health care provider may recommend having the fibroids removed. Follow these instructions at home:  Keep all follow-up visits as directed by your health care provider. This is important.  Take over-the-counter and prescription medicines only as told by your health care provider.  If you were prescribed a hormone treatment, take the hormone medicines exactly as directed.  Ask your health care provider about taking iron pills and increasing the amount of dark green, leafy vegetables in your diet. These actions can help to boost your blood iron levels, which may be affected by heavy menstrual bleeding.  Pay close attention to your period and tell your health care provider about any changes, such as:  Increased blood flow that requires you to use more pads or tampons than usual per month.  A change in the number of days that your period lasts per month.  A change in symptoms that are associated with your period, such as abdominal cramping or back pain. Contact a health care provider if:  You have pelvic pain, back pain, or abdominal cramps that cannot be controlled with medicines.  You have an increase in bleeding between and during periods.  You soak tampons or pads in a half hour or less.  You feel lightheaded, extra tired, or weak. Get help right away if:  You faint.  You have a sudden increase in pelvic pain. This information is not intended to replace advice given to you by your health care provider. Make sure you discuss any questions you have with your health care provider. Document Released: 03/09/2000 Document Revised: 11/10/2015 Document Reviewed: 09/08/2013 Elsevier Interactive Patient Education  2017 [employer]. Hysterectomy Information A  hysterectomy is a surgery in which your uterus is removed. This surgery may be done to treat various medical problems. After the surgery, you will no longer have menstrual periods. The surgery will also  make you unable to become pregnant (sterile). The fallopian tubes and ovaries can be removed (bilateral salpingo-oophorectomy) during this surgery as well. Reasons for a hysterectomy  Persistent, abnormal bleeding.  Lasting (chronic) pelvic pain or infection.  The lining of the uterus (endometrium) starts growing outside the uterus (endometriosis).  The endometrium starts growing in the muscle of the uterus (adenomyosis).  The uterus falls down into the vagina (pelvic organ prolapse).  Noncancerous growths in the uterus (uterine fibroids) that cause symptoms.  Precancerous cells.  Cervical cancer or uterine cancer. Types of hysterectomies  Supracervical hysterectomy-In this type, the top part of the uterus is removed, but not the cervix.  Total hysterectomy-The uterus and cervix are removed.  Radical hysterectomy-The uterus, the cervix, and the fibrous tissue that holds the uterus in place in the pelvis (parametrium) are removed. Ways a hysterectomy can be performed  Abdominal hysterectomy-A large surgical cut (incision) is made in the abdomen. The uterus is removed through this incision.  Vaginal hysterectomy-An incision is made in the vagina. The uterus is removed through this incision. There are no abdominal incisions.  Conventional laparoscopic hysterectomy-Three or four small incisions are made in the abdomen. A thin, lighted tube with a camera (laparoscope) is inserted into one of the incisions. Other tools are put through the other incisions. The uterus is cut into small pieces. The small pieces are removed through the incisions, or they are removed through the vagina.  Laparoscopically assisted vaginal hysterectomy (LAVH)-Three or four small incisions are made in the abdomen. Part of the surgery is performed laparoscopically and part vaginally. The uterus is removed through the vagina.  Robot-assisted laparoscopic hysterectomy-A laparoscope and other tools are inserted into  3 or 4 small incisions in the abdomen. A computer-controlled device is used to give the surgeon a 3D image and to help control the surgical instruments. This allows for more precise movements of surgical instruments. The uterus is cut into small pieces and removed through the incisions or removed through the vagina. What are the risks? Possible complications associated with this procedure include:  Bleeding and risk of blood transfusion. Tell your health care provider if you do not want to receive any blood products.  Blood clots in the legs or lung.  Infection.  Injury to surrounding organs.  Problems or side effects related to anesthesia.  Conversion to an abdominal hysterectomy from one of the other techniques. What to expect after a hysterectomy  You will be given pain medicine.  You will need to have someone with you for the first 3-5 days after you go home.  You will need to follow up with your surgeon in 2-4 weeks after surgery to evaluate your progress.  You may have early menopause symptoms such as hot flashes, night sweats, and insomnia.  If you had a hysterectomy for a problem that was not cancer or not a condition that could lead to cancer, then you no longer need Pap tests. However, even if you no longer need a Pap test, a regular exam is a good idea to make sure no other problems are starting. This information is not intended to replace advice given to you by your health care provider. Make sure you discuss any questions you have with your health care provider. Document Released: 09/05/2000 Document Revised:  08/18/2015 Document Reviewed: 11/17/2012 Elsevier Interactive Patient Education  2017 [employer].

## 2016-04-19 ENCOUNTER — Telehealth: Payer: Self-pay | Admitting: *Deleted

## 2016-04-19 ENCOUNTER — Encounter: Payer: Self-pay | Admitting: Obstetrics and Gynecology

## 2016-04-19 NOTE — Telephone Encounter (Signed)
-----   Message from Joylene Igo, [place] sent at 04/19/2016  1:19 PM EST ----- Please let her know her lab results, everything is normal except her TSH is too low. She needs to follow up with PCP about that.

## 2016-04-19 NOTE — Telephone Encounter (Signed)
Notified pt she will contact her her endo dr

## 2016-04-23 LAB — CBC
HEMOGLOBIN: 13 g/dL (ref 11.1–15.9)
Hematocrit: 37.7 % (ref 34.0–46.6)
MCH: 30.1 pg (ref 26.6–33.0)
MCHC: 34.5 g/dL (ref 31.5–35.7)
MCV: 87 fL (ref 79–97)
Platelets: 344 10*3/uL (ref 150–379)
RBC: 4.32 x10E6/uL (ref 3.77–5.28)
RDW: 12.9 % (ref 12.3–15.4)
WBC: 8.2 10*3/uL (ref 3.4–10.8)

## 2016-04-23 LAB — THYROID PANEL WITH TSH
FREE THYROXINE INDEX: 1.7 (ref 1.2–4.9)
T3 Uptake Ratio: 24 % (ref 24–39)
T4, Total: 7.2 ug/dL (ref 4.5–12.0)
TSH: 0.013 u[IU]/mL — AB (ref 0.450–4.500)

## 2016-04-23 LAB — VITAMIN D 1,25 DIHYDROXY
VITAMIN D 1, 25 (OH) TOTAL: 65 pg/mL
Vitamin D2 1, 25 (OH)2: <10 pg/mL
Vitamin D3 1, 25 (OH)2: 65 pg/mL

## 2016-04-23 LAB — FERRITIN: FERRITIN: 32 ng/mL (ref 15–150)

## 2016-04-23 LAB — VITAMIN B12: VITAMIN B 12: 306 pg/mL (ref 232–1245)

## 2016-04-30 ENCOUNTER — Encounter: Payer: Self-pay | Admitting: Obstetrics and Gynecology

## 2016-05-01 ENCOUNTER — Ambulatory Visit (INDEPENDENT_AMBULATORY_CARE_PROVIDER_SITE_OTHER): Payer: BLUE CROSS/BLUE SHIELD | Admitting: Obstetrics and Gynecology

## 2016-05-01 ENCOUNTER — Encounter: Payer: Self-pay | Admitting: Obstetrics and Gynecology

## 2016-05-01 VITALS — BP 139/82 | HR 79 | Ht 68.0 in | Wt 238.4 lb

## 2016-05-01 DIAGNOSIS — D252 Subserosal leiomyoma of uterus: Secondary | ICD-10-CM | POA: Diagnosis not present

## 2016-05-01 DIAGNOSIS — R102 Pelvic and perineal pain: Secondary | ICD-10-CM

## 2016-05-01 DIAGNOSIS — D259 Leiomyoma of uterus, unspecified: Secondary | ICD-10-CM | POA: Diagnosis not present

## 2016-05-01 DIAGNOSIS — N938 Other specified abnormal uterine and vaginal bleeding: Secondary | ICD-10-CM | POA: Diagnosis not present

## 2016-05-01 DIAGNOSIS — D250 Submucous leiomyoma of uterus: Secondary | ICD-10-CM

## 2016-05-01 NOTE — Patient Instructions (Addendum)
1. Hysterectomy is to be scheduled-TAH bilateral salpingectomy 2. Return in 1 week prior surgery for preoperative appointment 3. Endometrial biopsy is performed today.  Endometrial Biopsy, Care After Refer to this sheet in the next few weeks. These instructions provide you with information on caring for yourself after your procedure. Your health care provider may also give you more specific instructions. Your treatment has been planned according to current medical practices, but problems sometimes occur. Call your health care provider if you have any problems or questions after your procedure. WHAT TO EXPECT AFTER THE PROCEDURE After your procedure, it is typical to have the following:  You may have mild cramping and a small amount of vaginal bleeding for a few days after the procedure. This is normal. HOME CARE INSTRUCTIONS  Only take over-the-counter or prescription medicine as directed by your health care provider.  Do not douche, use tampons, or have sexual intercourse until your health care provider approves.  Follow your health care provider's instructions regarding any activity restrictions, such as strenuous exercise or heavy lifting. SEEK MEDICAL CARE IF:  You have heavy bleeding or bleeding longer than 2 days after the procedure.  You have bad smelling drainage from your vagina.  You have a fever and chills.  Youhave severe lower stomach (abdominal) pain. SEEK IMMEDIATE MEDICAL CARE IF:  You have severe cramps in your stomach or back.  You pass large blood clots.  Your bleeding increases.  You become weak or lightheaded, or you pass out. This information is not intended to replace advice given to you by your health care provider. Make sure you discuss any questions you have with your health care provider. Document Released: 12/31/2012 Document Reviewed: 12/31/2012 Elsevier Interactive Patient Education  2017 [employer].    Abdominal Hysterectomy Abdominal  hysterectomy is a surgical procedure to remove your womb (uterus). Your uterus is the muscular organ that contains a developing baby. This surgery is done for many reasons. You may need an abdominal hysterectomy if you have cancer, growths (tumors), long-term pain, or bleeding. You may also have this procedure if your uterus has slipped down into your vagina (uterine prolapse). Depending on why you need an abdominal hysterectomy, you may also have other reproductive organs removed. These could include the part of your vagina that connects with your uterus (cervix), the organs that make eggs (ovaries), and the tubes that connect the ovaries to the uterus (fallopian tubes). Tell a health care provider about:  Any allergies you have.  All medicines you are taking, including vitamins, herbs, eye drops, creams, and over-the-counter medicines.  Any problems you or family members have had with anesthetic medicines.  Any blood disorders you have.  Any surgeries you have had.  Any medical conditions you have. What are the risks? Generally, this is a safe procedure. However, as with any procedure, problems can occur. Infection is the most common problem after an abdominal hysterectomy. Other possible problems include:  Bleeding.  Formation of blood clots that may break free and travel to your lungs.  Injury to other organs near your uterus.  Nerve injury causing nerve pain.  Decreased interest in sex or pain during sexual intercourse. What happens before the procedure?  Abdominal hysterectomy is a major surgical procedure. It can affect the way you feel about yourself. Talk to your health care provider about the physical and emotional changes hysterectomy may cause.  You may need to have blood work and X-rays done before surgery.  Quit smoking if you smoke.  Ask your health care provider for help if you are struggling to quit.  Stop taking medicines that thin your blood as directed by your  health care provider.  You may be instructed to take antibiotic medicines or laxatives before surgery.  Do not eat or drink anything for 6-8 hours before surgery.  Take your regular medicines with a small sip of water.  Bathe or shower the night or morning before surgery. What happens during the procedure?  Abdominal hysterectomy is done in the operating room at the hospital.  In most cases, you will be given a medicine that makes you go to sleep (general anesthetic).  The surgeon will make a cut (incision) through the skin in your lower belly.  The incision may be about 5-7 inches long. It may go side-to-side or up-and-down.  The surgeon will move aside the body tissue that covers your uterus. The surgeon will then carefully take out your uterus along with any of your other reproductive organs that need to be removed.  Bleeding will be controlled with clamps or sutures.  The surgeon will close your incision with sutures or metal clips. What happens after the procedure?  You will have some pain immediately after the procedure.  You will be given pain medicine in the recovery room.  You will be taken to your hospital room when you have recovered from the anesthesia.  You may need to stay in the hospital for 2-5 days.  You will be given instructions for recovery at home. This information is not intended to replace advice given to you by your health care provider. Make sure you discuss any questions you have with your health care provider. Document Released: 03/17/2013 Document Revised: 08/18/2015 Document Reviewed: 01/02/2013 Elsevier Interactive Patient Education  2017 [place].  2. Return in 1 week prior to surgery for preoperative appointment 3. Endometrial biopsy is done today. Results will be made available.

## 2016-05-01 NOTE — Progress Notes (Signed)
GYN ENCOUNTER NOTE  Subjective:       Caitlin Barrett is a 46 y.o. G0P0000 female is here for gynecologic evaluation of the following issues:  1. Symptomatic fibroid uterus  Long history of infertility; no planning to have children in the future. Long history of dysfunctional uterine bleeding/menorrhagia and pelvic pain associated with bleeding. History of dyspareunia.  04/18/2016 ultrasound demonstrates multi-fibroid uterus with several submucosal in origin. Options of management have been reviewed including Lupron therapy, uterine artery embolization, myomectomy, and hysterectomy. Patient and spouse desire hysterectomy   Gynecologic History No LMP recorded (lmp unknown). Contraception: none Last Pap: 2015-negative Last mammogram: 2015-BI-RADS 1  Obstetric History OB History  Gravida Para Term Preterm AB Living  0 0 0 0 0 0  SAB TAB Ectopic Multiple Live Births  0 0 0 0 0        Past Medical History:  Diagnosis Date  . Allergy   . Anxiety   . Asthma    exercised enduced  . Cancer ([place]) 2010   Basal cell skin cancer Lt eye, skin graft above Lt eye  . Depression   . Dysmenorrhea   . Hypothyroid   . Painful menstrual periods   . Plantar fasciitis     Past Surgical History:  Procedure Laterality Date  . CHOLECYSTECTOMY  2009  . HERNIA REPAIR    . SKIN GRAFT  2010  . TONSILLECTOMY      Current Outpatient Prescriptions on File Prior to Visit  Medication Sig Dispense Refill  . chlorpheniramine (CHLOR-TRIMETON) 4 MG tablet Take 1 tablet by mouth daily.    Marland Kitchen EPINEPHrine 0.3 mg/0.3 mL IJ SOAJ injection     . fluticasone (FLONASE) 50 MCG/ACT nasal spray Place 2 sprays into both nostrils daily. 16 g 11  . levothyroxine (SYNTHROID, LEVOTHROID) 50 MCG tablet Take 1 tablet by mouth daily.  2  . meloxicam (MOBIC) 15 MG tablet Take 1 tablet (15 mg total) by mouth daily. 30 tablet 2  . norethindrone (MICRONOR,CAMILA,ERRIN) 0.35 MG tablet Take 1 tablet (0.35 mg total) by  mouth daily. 1 Package 11   No current facility-administered medications on file prior to visit.     Allergies  Allergen Reactions  . Contrast Media [Iodinated Diagnostic Agents]   . Other Hives    Uncoded Allergy. Allergen: contrast dye    Social History   Social History  . Marital status: Married    Spouse name: N/A  . Number of children: N/A  . Years of education: N/A   Occupational History  . Not on file.   Social History Main Topics  . Smoking status: Never Smoker  . Smokeless tobacco: Never Used  . Alcohol use Yes     Comment: rare  . Drug use: No  . Sexual activity: Yes   Other Topics Concern  . Not on file   Social History Narrative  . No narrative on file    Family History  Problem Relation Age of Onset  . Hyperlipidemia Mother   . Hypertension Mother   . Cancer Father   . Heart disease Maternal Grandmother   . Heart disease Maternal Grandfather     The following portions of the patient's history were reviewed and updated as appropriate: allergies, current medications, past family history, past medical history, past social history, past surgical history and problem list.  Review of Systems  Review of Systems - General ROS: negative for - chills, fatigue, fever, hot flashes, malaise or night sweats Hematological and  Lymphatic ROS: negative for - bleeding problems or swollen lymph nodes Gastrointestinal ROS: negative for - abdominal pain, blood in stools, change in bowel habits and nausea/vomiting Musculoskeletal ROS: negative for - joint pain, muscle pain or muscular weakness Genito-Urinary ROS: Positive History of abnormal uterine bleeding, pelvic pain, dyspareunia; no history of chronic discharge, genital ulcers, hematuria, incontinence  Objective:   BP 139/82   Pulse 79   Ht 5' 8"  (1.727 m)   Wt 238 lb 6.4 oz (108.1 kg)   LMP  (LMP Unknown)   BMI 36.25 kg/m  CONSTITUTIONAL: Well-developed, well-nourished female in no acute distress.  HENT:   Normocephalic, atraumatic.  NECK: Normal range of motion, supple, no masses.  Normal thyroid.  SKIN: Skin is warm and dry. No rash noted. Not diaphoretic. No erythema. No pallor. [place]: Alert and oriented to person, place, and time. PSYCHIATRIC: Normal mood and affect. Normal behavior. Normal judgment and thought content. CARDIOVASCULAR:Not Examined RESPIRATORY: Not Examined BREASTS: Not Examined ABDOMEN: Soft, non distended; Non tender.  No Organomegaly. PELVIC:  External Genitalia: Normal  BUS: Normal  Vagina: Normal  Cervix: Normal; situated high within the vaginal vault; 1/4 cervical motion tenderness  Uterus: Top normal size; 1/4 tender; mobile; midplane  [place]: Normal; nonpalpable nontender  RV: Normal external exam  Bladder: Nontender MUSCULOSKELETAL: Normal range of motion. No tenderness.  No cyanosis, clubbing, or edema.  Endometrial Biopsy Procedure Note  Pre-operative Diagnosis: Dysfunctional uterine bleeding  Post-operative Diagnosis: same  Procedure Details   Urine pregnancy test was done , and result was negative.  The risks (including infection, bleeding, pain, and uterine perforation) and benefits of the procedure were explained to the patient and Verbal informed consent was obtained.  Antibiotic prophylaxis against endocarditis was not indicated.   The patient was placed in the dorsal lithotomy position.  Bimanual exam showed the uterus to be in the neutral position.  A Graves' speculum inserted in the vagina, and the cervix prepped with povidone iodine.  Endocervical curettage with a Kevorkian curette was not performed.   A sharp tenaculum was not applied to the anterior lip of the cervix for stabilization.  A 3 mm Mylex pipette was used to sound the uterus to a depth of 7cm.  A Mylex 64m curette was used to sample the endometrium.  Sample was sent for pathologic examination.  Condition: Stable  Complications: None  Plan:  The patient was advised to  call for any fever or for prolonged or severe pain or bleeding. She was advised to use OTC acetaminophen and OTC ibuprofen as needed for mild to moderate pain. She was advised to avoid vaginal intercourse for 48 hours or until the bleeding has completely stopped.  Attending Physician Documentation: MBrayton Mars MD      Assessment:   1. Uterine leiomyoma, unspecified location  2. DUB (dysfunctional uterine bleeding)  3. Pelvic pain     Plan:   1. Endometrial biopsy 2. Management options for symptomatic uterine fibroids were reviewed in detail including uterine artery embolization, Depo-Lupron therapy, myomectomy , and hysterectomy. Due to the patient not desiring to have future children, and her significant symptomatology, patient has decided to move forward with hysterectomy. 3. TAH bilateral salpingectomy through a midline incision will be scheduled 4. Patient is to return 1 week prior surgery for preoperative appointment.  A total of 25 minutes were spent face-to-face with the patient during this encounter and over half of that time involved counseling and coordination of care.  MBrayton Mars MD  Note: This dictation was prepared with Dragon dictation along with smaller phrase technology. Any transcriptional errors that result from this process are unintentional.

## 2016-05-04 LAB — PATHOLOGY

## 2016-06-04 ENCOUNTER — Encounter: Payer: Self-pay | Admitting: Obstetrics and Gynecology

## 2016-06-04 ENCOUNTER — Other Ambulatory Visit: Payer: Self-pay | Admitting: *Deleted

## 2016-06-04 DIAGNOSIS — Z01419 Encounter for gynecological examination (general) (routine) without abnormal findings: Secondary | ICD-10-CM

## 2016-06-04 MED ORDER — NORETHINDRONE 0.35 MG PO TABS: 1.0000 | ORAL_TABLET | Freq: Every day | ORAL | 1 refills | Status: DC

## 2016-06-11 ENCOUNTER — Ambulatory Visit (INDEPENDENT_AMBULATORY_CARE_PROVIDER_SITE_OTHER): Payer: BLUE CROSS/BLUE SHIELD | Admitting: Nurse Practitioner

## 2016-06-11 ENCOUNTER — Encounter: Payer: Self-pay | Admitting: Nurse Practitioner

## 2016-06-11 VITALS — BP 157/80 | HR 75 | Temp 98.4°F | Resp 16 | Ht 68.0 in | Wt 242.0 lb

## 2016-06-11 DIAGNOSIS — D225 Melanocytic nevi of trunk: Secondary | ICD-10-CM

## 2016-06-11 NOTE — Assessment & Plan Note (Signed)
Schedule and follow up with Dermatology for evaluation and possible removal of nevus.

## 2016-06-11 NOTE — Patient Instructions (Addendum)
1. Skin lesion  Please make an appointment with dermatology.  The office will call you with the appointment.  If you haven't heard anything about your appointment in 3-4 weeks, please call us to find out about the referral.    If you haven't seen dermatology by 06/27/16 when you have your hysterectomy presurgical appointment, ask about it at your visit with the gynecological surgeon.  Continue to monitor skin for other areas that may be cancerous using the ABCDE format. A- Asymmetry B-Border C- Color D- Diameter E - Evolution   Sunburn  Sunburn is damage to the skin that is caused by overexposure to ultraviolet (UV) rays. Repeated sun exposure causes early skin aging, such as wrinkles and sun spots. It also increases the risk of skin cancer.  CAUSES Sunburn is caused by getting too much UV radiation from the sun. RISK FACTORS The following factors may make you more likely to develop this condition:  Having a family history of sensitivity to the sun.  Having certain diseases, such as lupus.  Taking certain medicines.  Using certain cosmetics.  Having light-colored skin (light complexion). SYMPTOMS Symptoms of this condition include:  Red or pink skin.  Soreness and swelling of the affected areas.  Pain.  Blisters.  Peeling skin. You may also have a headache, fever, or fatigue if the sunburn covers a large part of your body. DIAGNOSIS This condition is diagnosed with a medical history and physical exam. TREATMENT Treatment focuses on managing your symptoms. Treatment may include:  Medicines to reduce swelling.  Steroid medicines to help with inflammation and itching. These may be applied as creams or taken by mouth (orally).  Antibiotic cream or ointment to apply to any blisters that break open. HOME CARE INSTRUCTIONS Medicines   Take or apply over-the-counter and prescription medicines only as told by your health care provider.  If you were prescribed an  antibiotic medicine, use it as told by your health care provider. Do not stop using the antibiotic even if your condition improves. General Instructions   Avoid further exposure to the sun. Protect sunburned skin by wearing clothing that covers the injured skin.  Do not put ice on your sunburn. This can cause further damage. Try taking a cool bath or applying a cool, wet cloth (cool compress) to your skin. This may help with pain.  Drink enough fluid to keep your urine clear or pale yellow.  Try applying aloe vera or a moisturizer that has soy in it to your sunburn. This may help. Do not apply aloe vera or moisturizer with soy if your sunburn has blisters.  Do not break any blisters that you may have. PREVENTION  Try to avoid the sun between 10:00 a.m. and 2:00 p.m. when it is the strongest.  Apply sunscreen at least 15 minutes before exposure to the sun.  Apply a sunscreen with an SPF of 15 or higher. Consider using an SPF of 30 or higher if you will be exposed to the sun for prolonged periods of time. Use a sunscreen that protects against all of the sun's rays (broad-spectrum) and is water-resistant.  Reapply sunscreen:  About every two hours during sun exposure.  More often when sweating a lot while out in the sun.  After getting wet from swimming or playing in water.  Wear long sleeves, a hat, and sunglasses that block UV light when you are outside.  Talk with your health care provider about medicines, herbs, and foods that can make you more  sensitive to light. Avoid these, if possible.  Do not use tanning beds. SEEK MEDICAL CARE IF:  You have a fever.  Your symptoms do not improve with treatment.  Your pain is not controlled with medicine.  Your burn becomes more painful and swollen. SEEK IMMEDIATE MEDICAL CARE IF:  You start to vomit or have diarrhea.  You feel faint or you pass out.  You have a headache and you feel confused.  You develop severe  blistering.  You have pus or fluid coming from the blisters. This information is not intended to replace advice given to you by your health care provider. Make sure you discuss any questions you have with your health care provider. Document Released: 12/20/2004 Document Revised: 07/04/2015 Document Reviewed: 09/13/2014 Elsevier Interactive Patient Education  2017 [place].   Please schedule a follow-up appointment with Cassell Smiles, AGNP as needed.    If you have any other questions or concerns, please feel free to call the clinic or send a message through [place]. You may also schedule an earlier appointment if necessary.  Cassell Smiles, DNP, AGPCNP-BC [place]

## 2016-06-11 NOTE — Progress Notes (Signed)
Subjective:    Patient ID: Caitlin Barrett, female    DOB: 1970/05/25, 46 y.o.   MRN: [id number]  Caitlin Barrett is a 46 y.o. 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 Concern today for changing freckles on her abdomen.  This lesion is in an area that is difficult for the patient to view.  She reports that her husband noticed changes a couple weeks ago and checked again this weekend and thinks the lesion might be larger now.  There was noticeably more redness under pannus near the skin lesion that day.  After the lesion, she had 1 regular skin check but hasn't had a dermatology skin check since 2012.  Patient is also concerned that it may interfere with her plan for hysterectomy in April.  She has not had her presurgical appointment at this time to ask the surgeon if it will interfere.   Social History  Substance Use Topics  . Smoking status: Never Smoker  . Smokeless tobacco: Never Used  . Alcohol use Yes     Comment: rare    Review of Systems Per HPI unless specifically indicated above     Objective:    BP (!) 157/80   Pulse 75   Temp 98.4 F (36.9 C) (Oral)   Resp 16   Ht 5\' 8"  (1.727 m)   Wt 242 lb (109.8 kg)   BMI 36.80 kg/m   Wt Readings from Last 3 Encounters:  06/11/16 242 lb (109.8 kg)  05/01/16 238 lb 6.4 oz (108.1 kg)  04/18/16 239 lb 11.2 oz (108.7 kg)    Physical Exam  Constitutional: She appears well-developed and well-nourished. No distress.  Obese  HENT:  Head: Normocephalic and atraumatic.  Abdominal: Soft. Bowel sounds are normal. There is no tenderness. There is no rebound and no guarding.  Neurological: She is alert.  Skin: Skin is warm and dry. She is not diaphoretic.     Nevus in RLQ near groin.  Color varies from dark brown to light white.  1 cm wide x 0.5 cm high. Asymmetric with irregular borders.        Results for orders placed or performed in visit on 05/01/16  Pathology  Result Value Ref Range   PATH REPORT.SITE OF ORIGIN SPEC Comment    . Comment    PATH REPORT.FINAL DX SPEC Comment    SIGNED OUT BY: Comment    GROSS DESCRIPTION: Comment    . Comment    PAYMENT PROCEDURE Comment       Assessment & Plan:   Problem List Items Addressed This Visit    Nevus of abdominal wall - Primary    Schedule and follow up with Dermatology for evaluation and possible removal of nevus.      Relevant Orders   Ambulatory referral to Dermatology      Meds ordered this encounter  Medications  . Multiple Vitamins-Minerals (MULTIVITAMIN ADULT PO)    Sig: Take by mouth.      Follow up plan: Return in about 2 months (around 08/11/2016) for annual physical (sometime May or June).   Wilhelmina Mcardle, AGPCNP-BC [facility] 06/11/2016, 5:01 PM

## 2016-06-11 NOTE — Progress Notes (Signed)
I have reviewed this encounter including the documentation in this note and/or discussed this patient with the provider, Cassell Smiles, AGPCNP-BC. I am certifying that I agree with the content of this note as supervising physician.  Nobie Putnam, [facility] 06/11/2016, 7:12 PM

## 2016-06-20 ENCOUNTER — Ambulatory Visit
Admission: RE | Admit: 2016-06-20 | Discharge: 2016-06-20 | Disposition: A | Discharge status: Home / Self Care | Payer: BLUE CROSS/BLUE SHIELD | Source: Ambulatory Visit | Attending: Family Medicine | Admitting: Family Medicine

## 2016-06-20 ENCOUNTER — Ambulatory Visit (INDEPENDENT_AMBULATORY_CARE_PROVIDER_SITE_OTHER): Payer: BLUE CROSS/BLUE SHIELD | Admitting: Family Medicine

## 2016-06-20 ENCOUNTER — Encounter: Payer: Self-pay | Admitting: Family Medicine

## 2016-06-20 VITALS — BP 141/78 | HR 72 | Temp 98.2°F | Resp 16 | Ht 68.0 in | Wt 242.0 lb

## 2016-06-20 DIAGNOSIS — M25531 Pain in right wrist: Secondary | ICD-10-CM

## 2016-06-20 DIAGNOSIS — W19XXXA Unspecified fall, initial encounter: Secondary | ICD-10-CM | POA: Diagnosis not present

## 2016-06-20 DIAGNOSIS — J309 Allergic rhinitis, unspecified: Secondary | ICD-10-CM

## 2016-06-20 DIAGNOSIS — J3089 Other allergic rhinitis: Secondary | ICD-10-CM

## 2016-06-20 MED ORDER — IPRATROPIUM BROMIDE 0.06 % NA SOLN: 2.0000 | Freq: Four times a day (QID) | NASAL | 0 refills | Status: DC

## 2016-06-20 MED ORDER — NAPROXEN 500 MG PO TABS: 500.0000 mg | ORAL_TABLET | Freq: Two times a day (BID) | ORAL | 0 refills | Status: DC

## 2016-06-20 NOTE — Assessment & Plan Note (Addendum)
Likely primary etiology currently with allergic sinusitis, recent worsening in setting of chronic year-round allergies, see A&P for management of possible sinusitis - Refractory to Flonase in past (limited results) - Continues oral anti-histamine, weekly allergy shots - Consider future options such as Singulair

## 2016-06-20 NOTE — Patient Instructions (Signed)
Thank you for coming in to clinic today.  1. It sounds like you have persistent Sinus Congestion or "Rhinosinusitis" - I do not think that this is a Bacterial Sinus Infection. Usually these are caused by Viruses or Allergies, and will run it's course in about 7 to 10 days. - No antibiotics are needed - If worsening, notify office within 48 hours, or early next week - Start Atrovent nasal spray decongestant 2 sprays in each nostril up to 4 times daily for 7 days - Continue current anti-histamine, allergy shots etc - Recommend to try using Nasal Saline spray multiple times a day to help flush out congestion and clear sinuses - Improve hydration by drinking plenty of clear fluids (water, gatorade) to reduce secretions and thin congestion - Congestion draining down throat can cause irritation. May try warm herbal tea with honey, cough drops - Start Mucinex (with or without sinus decongestant) OTC - 7-10days - Can take Tylenol or Ibuprofen as needed for fevers  If you develop persistent fever >101F for at least 3 consecutive days, headaches with sinus pain or pressure or persistent earache, please schedule a follow-up evaluation within next few days to week.  2. Right Wrist pain - most likely sprain, however we will check x-ray today to rule out occult or hidden fracture  - Use RICE therapy: - R - Rest / relative rest with activity modification avoid overuse of joint - I - Ice packs (make sure you use a towel or sock / something to protect skin) - C - Compression / Support - with wrist brace fine to use with activity, especially dog walking, and at night-time - 2-4 weeks - E - Elevation - if significant swelling, lift leg above heart level (toes above your nose) to help reduce swelling, most helpful at night after day of being on your feet  Recommend trial of Anti-inflammatory with Naproxen (Naprosyn) 546m tabs - take one with food and plenty of water TWICE daily every day (breakfast and dinner),  for next 2 to 4 weeks, then you may take only as needed - DO NOT TAKE any ibuprofen, aleve, motrin, meloxicam while you are taking this medicine - It is safe to take Tylenol Ext Str 5049mtabs - take 1 to 2 (max dose 100074mevery 6 hours as needed for breakthrough pain, max 24 hour daily dose is 6 to 8 tablets or 4000m21mPlease schedule a follow-up appointment with Dr. KaraParks RangeraurCassell Smilesin 4 weeks for R wrist pain  If you have any other questions or concerns, please feel free to call the clinic or send a message through [place] may also schedule an earlier appointment if necessary.  Caitlin Barrett

## 2016-06-20 NOTE — Progress Notes (Signed)
Subjective:    Patient ID: Caitlin Barrett, female    DOB: 25-Oct-1970, 46 y.o.   MRN: [id number]  Caitlin Barrett is a 46 y.o. female presenting on 06/20/2016 for Sinusitis (nasal congestion HA few days no chills or fever  obtw pt had fall Saturday and wrist pain and upper arm on Right side)  Patient presents for a same day appointment.  HPI   Sinusitis / Allergic Rhinitis: - Reports symptoms started 3 days ago with dull sinus pressure in middle of head, and has some nasal congestion, now worsening congestion, with some thicker green congestion - Has chronic seasonal / environmental allergies, year-round symptoms, followed by Dr Jenne Campus at [facility] for weekly maintenance allergy injections - Taking Chlorpheniramine rx once daily, has taken some OTC oral decongestant as well. No longer using nasal sprays - Admits dull sinus headache - Denies fevers/chills, sweats, ear pain or pressure, sore throat, cough  Right Wrist Pain, Fall - Reports prior history of remote injury in middle school with "bruised wrist" but no fracture, otherwise no significant history with R wrist or hand injury. - Today reports new problem started 4 days ago on Saturday after fall, was getting up to go to bathroom, and she tripped over rug by accident, fell forward on outstretched hand, caught with R wrist. Initially had some mild pain then gradual worsening, later developed some swelling of wrist. No bruising initially. Symptoms had been mostly intermittent for few days, now today seems worse with more constant aching pain 6/10, not worsening with any activity of hand/wrist gripping lifting, now swelling improving today. - Using wrist splint for support with some relief. Taking Ibuprofen 200mg  x 2 per dose about 3x daily and Tylenol x 1 few times daily. With some mild relief. 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 Smoking status: Never Smoker  . Smokeless tobacco: Never Used  . Alcohol use Yes     Comment: rare    Review of Systems Per HPI unless specifically indicated above     Objective:    BP (!) 141/78   Pulse 72   Temp 98.2 F (36.8 C) (Oral)   Resp 16   Ht 5\' 8"  (1.727 m)   Wt 242 lb (109.8 kg)   LMP 05/25/2016 (Approximate)   SpO2 99%   BMI 36.80 kg/m   Wt Readings from Last 3 Encounters:  06/20/16 242 lb (109.8 kg)  06/11/16 242 lb (109.8 kg)  05/01/16 238 lb 6.4 oz (108.1 kg)    Physical Exam  Constitutional: She appears well-developed and well-nourished. No distress.  Well-appearing, comfortable, cooperative  HENT:  Head: Normocephalic and atraumatic.  Mouth/Throat: Oropharynx is clear and moist.  Mild R maxillary sinus tenderness. Nares mostly patent with congestion without purulence. Bilateral TMs clear without erythema, effusion or bulging, some semicircular bilateral scar tissue on TMs from prior tubes. Oropharynx clear without erythema, exudates, edema or asymmetry.  Eyes: Conjunctivae are normal. Right eye exhibits no discharge. Left eye exhibits no discharge.  Neck: Normal range of motion. Neck supple.  Cardiovascular: Normal rate, regular rhythm, normal heart sounds and intact distal pulses.   No murmur  heard. Pulmonary/Chest: Effort normal and breath sounds normal. No respiratory distress. She has no wheezes. She has no rales.  Good air movement  Musculoskeletal: She exhibits edema (Improving, R wrist).  Right Hand/Wrist Inspection: Normal appearance, symmetrical, no bulky MCP joints, no edema or erythema. Palpation: Tender over R radial wrist. Non tender hand, carpal bones, including MCP, base of thumb. No distinct anatomical snuff box or  scaphoid tenderness. ROM: full active wrist ROM flex / ext, ulnar / radial deviation Special Testing: Finkelstein's test positive with mild pain. Negative  Tinel's median nerve Strength: 5/5 grip, thumb opposition, wrist flex/ext Neurovascular: distally intact  Lymphadenopathy:    She has no cervical adenopathy.  Neurological: She is alert.  Skin: Skin is warm and dry. No rash noted. She is not diaphoretic. No erythema.  Psychiatric: Her behavior is normal.  Nursing note and vitals reviewed.  I have personally reviewed the radiology report from 06/20/16 on R wrist X-ray.  CLINICAL DATA:  Fall several days ago with right wrist pain, initial encounter  EXAM: RIGHT WRIST - COMPLETE 3+ VIEW  COMPARISON:  None.  FINDINGS: No acute fracture or dislocation is noted. Ulnar minus variant is noted. No soft tissue abnormality is seen.  IMPRESSION: No acute bony abnormality noted.   Electronically Signed   By: Alcide Clever M.D.   On: 06/20/2016 15:40     Assessment & Plan:   Problem List Items Addressed This Visit    Allergic rhinitis    Likely primary etiology triggering current allergic sinusitis - In setting of chronic year-round allergies, see A&P for management of possible sinusitis - Refractory to Flonase in past (limited results) - Continues oral anti-histamine, weekly allergy shots - Consider future options such as Singulair      Relevant Medications   ipratropium (ATROVENT) 0.06 % nasal spray    Other Visit Diagnoses    Acute pain of right wrist    -  Primary  Acute  R radial wrist pain, with improving swelling following new fall injury 4 days ago - No significant weakness on exam. Tenderness is more generalized, not acute bony tenderness less likely fracture, no significant pain over anatomical snuff box (cannot rule out concern for scaphoid), has some tendonitis symptoms with R hand, but not consistent with DeQuervain's tenosynovitis or carpal tunnel - No  prior history of wrist fracture or surgery - Mild improvement on conservative therapy is reassuring  Plan: 1. Check R wrist x-ray complete series today which includes scaphoid view - *UPDATE* reviewed images and result and negative without acute abnormality, patient notified 2. Start Naproxen 500mg  BID wc for 2-4 weeks regular dosing, with Tylenol breakthrough PRN 3. Recommend to continue Thumb Spica Wrist Splint to avoid wrist flex/ext overnight, and at work with activity 2-4 weeks 4. RICE therapy (rest, ice, compression, elevation) for swelling, activity modification 5. Follow-up 4-6 weeks, if still worsening, consider referral to Ortho for further eval     Relevant Medications   naproxen (NAPROSYN) 500 MG tablet   Other Relevant Orders   DG Wrist Complete Right (Completed)   Fall, initial encounter        Allergic sinusitis      Consistent with acute rhinosinusitis, likely initially allergic rhinitis etiology, without evidence of acute bacterial infection.  Plan: 1. Reassurance, likely self-limited - no indication for antibiotics at this time 2. Start Atrovent nasal spray decongestant 2 sprays in each nostril up to 4 times daily for 7 days 3. Continue oral anti-histamine 4. Unfortunately can't  use Flonase - ineffective in past 5. Start nasal saline flushes 6. Add mucinex OTC, and may continue oral decongestant 7. Follow-up as needed, if not improving 1-2 weeks or criteria of bacterial sinusitis, then can notify office, and likely provide augmentin antibiotic course     Relevant Medications   ipratropium (ATROVENT) 0.06 % nasal spray      Meds ordered this encounter  Medications  .       . ipratropium (ATROVENT) 0.06 % nasal spray    Sig: Place 2 sprays into both nostrils 4 (four) times daily. For up to 5-7 days then stop.    Dispense:  15 mL    Refill:  0  . naproxen (NAPROSYN) 500 MG tablet    Sig: Take 1 tablet (500 mg total) by mouth 2 (two) times daily with a meal.  For 2-4 weeks then as needed    Dispense:  60 tablet    Refill:  0      Follow up plan: Return in about 4 weeks (around 07/18/2016) for Right wrist pain, sinusitis.  Saralyn Pilar, DO [facility] 06/20/2016, 8:17 PM

## 2016-06-27 ENCOUNTER — Encounter: Payer: Self-pay | Admitting: Obstetrics and Gynecology

## 2016-06-27 ENCOUNTER — Ambulatory Visit (INDEPENDENT_AMBULATORY_CARE_PROVIDER_SITE_OTHER): Payer: BLUE CROSS/BLUE SHIELD | Admitting: Obstetrics and Gynecology

## 2016-06-27 VITALS — BP 146/74 | HR 84 | Ht 68.0 in | Wt 243.8 lb

## 2016-06-27 DIAGNOSIS — N938 Other specified abnormal uterine and vaginal bleeding: Secondary | ICD-10-CM

## 2016-06-27 DIAGNOSIS — D259 Leiomyoma of uterus, unspecified: Secondary | ICD-10-CM

## 2016-06-27 DIAGNOSIS — Z01818 Encounter for other preprocedural examination: Secondary | ICD-10-CM

## 2016-06-27 DIAGNOSIS — R102 Pelvic and perineal pain: Secondary | ICD-10-CM

## 2016-06-27 NOTE — Progress Notes (Signed)
Subjective:  PREOPERATIVE HISTORY AND PHYSICAL   Date of surgery: 07/02/2016 Procedure: TAH bilateral salpingectomy Diagnosis: 1. Uterine fibroids 2. Dysfunctional uterine bleeding 3. Pelvic pain   Patient is a 46 y.o. [id number] scheduled for TAH bilateral salpingectomy for management of symptomatic uterine fibroids.    Long history of infertility; no planning to have children in the future. Long history of dysfunctional uterine bleeding/menorrhagia and pelvic pain associated with bleeding. History of dyspareunia.  04/18/2016 ultrasound demonstrates multi-fibroid uterus with several submucosal in origin. Options of management have been reviewed including Lupron therapy, uterine artery embolization, myomectomy, and hysterectomy. Patient and spouse desire hysterectomy   Gynecologic History No LMP recorded (lmp unknown). Contraception: none Last Pap: 2015-negative Last mammogram: 2015-BI-RADS 1  OB History    Gravida Para Term Preterm AB Living   0 0 0 0 0 0   SAB TAB Ectopic Multiple Live Births   0 0 0 0 0      Patient's last menstrual period was 05/26/2016 (exact date).    Past Medical History:  Diagnosis Date  . Allergy    seasonal.  [facility]  . Anxiety   . Asthma    exercised enduced  . Cancer ([facility] [place]   Basal cell skin cancer Lt eye, skin graft above Lt eye  . Depression   . Dysmenorrhea   . Hypothyroid    Nodules and Fine needle aspiration  . Painful menstrual periods   . Plantar fasciitis   . Sprain of carpal (joint) of wrist     Past Surgical History:  Procedure Laterality Date  . CHOLECYSTECTOMY  2009  . HERNIA REPAIR  11610  umbilical  . SKIN GRAFT  2010  . TONSILLECTOMY      OB History  Gravida Para Term Preterm AB Living  0 0 0 0 0 0  SAB TAB Ectopic Multiple Live Births  0 0 0 0 0        Social History   Social History  . Marital status: Married    Spouse name: N/A  . Number of children: N/A  .  Years of education: N/A   Social History Main Topics  . Smoking status: Never Smoker  . Smokeless tobacco: Never Used  . Alcohol use Yes     Comment: rare  . Drug use: No  . Sexual activity: Yes    Birth control/ protection: Pill   Other Topics Concern  . None   Social History Narrative  . None    Family History  Problem Relation Age of Onset  . Hyperlipidemia Mother   . Hypertension Mother   . Cancer Father     lung CA  . Heart disease Maternal Grandmother   . Stroke Maternal Grandmother   . Hypertension Maternal Grandmother   . Diabetes Maternal Grandmother   . Heart disease Maternal Grandfather   . Diabetes Maternal Grandfather   . Heart attack Maternal Grandfather      (Not in a hospital admission)  Allergies  Allergen Reactions  . Contrast Media [Iodinated Diagnostic Agents]   . Other Hives    Uncoded Allergy. Allergen: contrast dye    Review of Systems Constitutional: No recent fever/chills/sweats Respiratory: No recent cough/bronchitis Cardiovascular: No chest pain Gastrointestinal: No recent nausea/vomiting/diarrhea Genitourinary: No UTI symptoms Hematologic/lymphatic:No history of coagulopathy or recent blood thinner use    Objective:    BP (!) 146/74   Pulse 84   Ht 5' 8"  (1.727 m)   Wt  243 lb 12.8 oz (110.6 kg)   LMP 05/26/2016 (Exact Date)   BMI 37.07 kg/m   General:   Normal  Skin:   normal  HEENT:  Normal  Neck:  Supple without Adenopathy or Thyromegaly  Lungs:   Heart:              Breasts:   Abdomen:  Pelvis:  M/S   Extremeties:  Neuro:    clear to auscultation bilaterally   Normal without murmur   Not Examined   soft, non-tender; bowel sounds normal; no masses,  no organomegaly   Exam deferred to OR  No CVAT  Warm/Dry   Normal        05/01/2016 PELVIC:             External Genitalia: Normal             BUS: Normal             Vagina: Normal             Cervix: Normal; situated high within the vaginal vault; 1/4  cervical motion tenderness             Uterus: Top normal size; 1/4 tender; mobile; midplane             Adnexa: Normal; nonpalpable nontender             RV: Normal external exam             Bladder: Nontender  Assessment:     Symptomatic fibroid uterus-14 week size Dysfunctional uterine bleeding Chronic pelvic pain   Plan:  TAH bilateral salpingectomy   Preoperative counseling: The patient is to undergo TAH bilateral salpingectomy for symptomatic fibroid uterus-14 week size. She is understanding of the planned procedure and is aware of and is accepting of all surgical risks which include but are not limited to bleeding, infection, pelvic organ injury with need for repair, blood clot disorders, anesthesia risks, etc. All questions have been answered. Informed consent is given. Patient is ready and willing to proceed with surgery as scheduled.  Brayton Mars, MD  Note: This dictation was prepared with Dragon dictation along with smaller phrase technology. Any transcriptional errors that result from this process are unintentional.

## 2016-06-27 NOTE — H&P (Signed)
Subjective:  PREOPERATIVE HISTORY AND PHYSICAL   Date of surgery: 07/02/2016 Procedure: TAH bilateral salpingectomy Diagnosis: 1. Uterine fibroids 2. Dysfunctional uterine bleeding 3. Pelvic pain   Patient is a 46 y.o. G0P006fmale scheduled for TAH bilateral salpingectomy for management of symptomatic uterine fibroids.    Long history of infertility; no planning to have children in the future. Long history of dysfunctional uterine bleeding/menorrhagia and pelvic pain associated with bleeding. History of dyspareunia.  04/18/2016 ultrasound demonstrates multi-fibroid uterus with several submucosal in origin. Options of management have been reviewed including Lupron therapy, uterine artery embolization, myomectomy, and hysterectomy. Patient and spouse desire hysterectomy   Gynecologic History No LMP recorded (lmp unknown). Contraception: none Last Pap: 2015-negative Last mammogram: 2015-BI-RADS 1  OB History    Gravida Para Term Preterm AB Living   0 0 0 0 0 0   SAB TAB Ectopic Multiple Live Births   0 0 0 0 0      Patient's last menstrual period was 05/26/2016 (exact date).    Past Medical History:  Diagnosis Date  . Allergy    seasonal.  [facility]  . Anxiety   . Asthma    exercised enduced  . [place]   Basal cell skin cancer Lt eye, skin graft above Lt eye  . Depression   . Dysmenorrhea   . Hypothyroid    Nodules and Fine needle aspiration  . Painful menstrual periods   . Plantar fasciitis   . Sprain of carpal (joint) of wrist     Past Surgical History:  Procedure Laterality Date  . CHOLECYSTECTOMY  2009  . HERNIA REPAIR  17989  umbilical  . SKIN GRAFT  2010  . TONSILLECTOMY      OB History  Gravida Para Term Preterm AB Living  0 0 0 0 0 0  SAB TAB Ectopic Multiple Live Births  0 0 0 0 0        Social History   Social History  . Marital status: Married    Spouse name: N/A  . Number of children: N/A  .  Years of education: N/A   Social History Main Topics  . Smoking status: Never Smoker  . Smokeless tobacco: Never Used  . Alcohol use Yes     Comment: rare  . Drug use: No  . Sexual activity: Yes    Birth control/ protection: Pill   Other Topics Concern  . None   Social History Narrative  . None    Family History  Problem Relation Age of Onset  . Hyperlipidemia Mother   . Hypertension Mother   . Cancer Father     lung CA  . Heart disease Maternal Grandmother   . Stroke Maternal Grandmother   . Hypertension Maternal Grandmother   . Diabetes Maternal Grandmother   . Heart disease Maternal Grandfather   . Diabetes Maternal Grandfather   . Heart attack Maternal Grandfather      (Not in a hospital admission)  Allergies  Allergen Reactions  . Contrast Media [Iodinated Diagnostic Agents]   . Other Hives    Uncoded Allergy. Allergen: contrast dye    Review of Systems Constitutional: No recent fever/chills/sweats Respiratory: No recent cough/bronchitis Cardiovascular: No chest pain Gastrointestinal: No recent nausea/vomiting/diarrhea Genitourinary: No UTI symptoms Hematologic/lymphatic:No history of coagulopathy or recent blood thinner use    Objective:    BP (!) 146/74   Pulse 84   Ht 5' 8"  (1.727 m)   Wt  243 lb 12.8 oz (110.6 kg)   LMP 05/26/2016 (Exact Date)   BMI 37.07 kg/m   General:   Normal  Skin:   normal  HEENT:  Normal  Neck:  Supple without Adenopathy or Thyromegaly  Lungs:   Heart:              Breasts:   Abdomen:  Pelvis:  M/S   Extremeties:  Neuro:    clear to auscultation bilaterally   Normal without murmur   Not Examined   soft, non-tender; bowel sounds normal; no masses,  no organomegaly   Exam deferred to OR  No CVAT  Warm/Dry   Normal        05/01/2016 PELVIC:             External Genitalia: Normal             BUS: Normal             Vagina: Normal             Cervix: Normal; situated high within the vaginal vault; 1/4  cervical motion tenderness             Uterus: Top normal size; 1/4 tender; mobile; midplane             Adnexa: Normal; nonpalpable nontender             RV: Normal external exam             Bladder: Nontender  Assessment:     Symptomatic fibroid uterus-14 week size Dysfunctional uterine bleeding Chronic pelvic pain   Plan:  TAH bilateral salpingectomy   Preoperative counseling: The patient is to undergo TAH bilateral salpingectomy for symptomatic fibroid uterus-14 week size. She is understanding of the planned procedure and is aware of and is accepting of all surgical risks which include but are not limited to bleeding, infection, pelvic organ injury with need for repair, blood clot disorders, anesthesia risks, etc. All questions have been answered. Informed consent is given. Patient is ready and willing to proceed with surgery as scheduled.  Brayton Mars, MD  Note: This dictation was prepared with Dragon dictation along with smaller phrase technology. Any transcriptional errors that result from this process are unintentional.

## 2016-06-27 NOTE — Patient Instructions (Signed)
1. Return in 1 week after surgery for postop check

## 2016-06-28 ENCOUNTER — Encounter: Payer: Self-pay | Admitting: *Deleted

## 2016-06-28 ENCOUNTER — Encounter
Admission: RE | Admit: 2016-06-28 | Discharge: 2016-06-28 | Disposition: A | Discharge status: Home / Self Care | Payer: BLUE CROSS/BLUE SHIELD | Source: Ambulatory Visit | Attending: Obstetrics and Gynecology | Admitting: Obstetrics and Gynecology

## 2016-06-28 DIAGNOSIS — R102 Pelvic and perineal pain: Secondary | ICD-10-CM | POA: Insufficient documentation

## 2016-06-28 DIAGNOSIS — Z0183 Encounter for blood typing: Secondary | ICD-10-CM | POA: Insufficient documentation

## 2016-06-28 DIAGNOSIS — D259 Leiomyoma of uterus, unspecified: Secondary | ICD-10-CM | POA: Insufficient documentation

## 2016-06-28 DIAGNOSIS — Z01812 Encounter for preprocedural laboratory examination: Secondary | ICD-10-CM | POA: Insufficient documentation

## 2016-06-28 DIAGNOSIS — N938 Other specified abnormal uterine and vaginal bleeding: Secondary | ICD-10-CM | POA: Insufficient documentation

## 2016-06-28 HISTORY — DX: Irritable bowel syndrome, unspecified: K58.9

## 2016-06-28 HISTORY — DX: Anemia, unspecified: D64.9

## 2016-06-28 HISTORY — DX: Headache, unspecified: R51.9

## 2016-06-28 HISTORY — DX: Unspecified osteoarthritis, unspecified site: M19.90

## 2016-06-28 HISTORY — DX: Headache: R51

## 2016-06-28 LAB — CBC WITH DIFFERENTIAL/PLATELET
BASOS ABS: 0 10*3/uL (ref 0–0.1)
BASOS PCT: 1 %
EOS PCT: 9 %
Eosinophils Absolute: 0.8 10*3/uL — ABNORMAL HIGH (ref 0–0.7)
HEMATOCRIT: 40.4 % (ref 35.0–47.0)
Hemoglobin: 13.8 g/dL (ref 12.0–16.0)
Lymphocytes Relative: 19 %
Lymphs Abs: 1.7 10*3/uL (ref 1.0–3.6)
MCH: 30 pg (ref 26.0–34.0)
MCHC: 34.1 g/dL (ref 32.0–36.0)
MCV: 88 fL (ref 80.0–100.0)
MONO ABS: 0.8 10*3/uL (ref 0.2–0.9)
Monocytes Relative: 9 %
NEUTROS ABS: 5.6 10*3/uL (ref 1.4–6.5)
Neutrophils Relative %: 62 %
PLATELETS: 380 10*3/uL (ref 150–440)
RBC: 4.59 MIL/uL (ref 3.80–5.20)
RDW: 13.1 % (ref 11.5–14.5)
WBC: 9 10*3/uL (ref 3.6–11.0)

## 2016-06-28 LAB — TYPE AND SCREEN
ABO/RH(D): O NEG
Antibody Screen: NEGATIVE

## 2016-06-28 LAB — RAPID HIV SCREEN (HIV 1/2 AB+AG)
HIV 1/2 Antibodies: NONREACTIVE
HIV-1 P24 Antigen - HIV24: NONREACTIVE

## 2016-06-28 NOTE — Patient Instructions (Signed)
  Your procedure is scheduled on: July 02, 2016 (Monday) Report to Same Day Surgery 2nd floor medical mall [facility] Entrance-take elevator on left to 2nd floor.  Check in with surgery information desk.) To find out your arrival time please call [phone] between 1PM - 3PM on June 29, 2016 (Friday)  Remember: Instructions that are not followed completely may result in serious medical risk, up to and including death, or upon the discretion of your surgeon and anesthesiologist your surgery may need to be rescheduled.    _x___ 1. Do not eat food or drink liquids after midnight. No gum chewing or hard candies                               __x__ 2. No Alcohol for 24 hours before or after surgery.   __x__3. No Smoking for 24 prior to surgery.   ____  4. Bring all medications with you on the day of surgery if instructed.    __x__ 5. Notify your doctor if there is any change in your medical condition     (cold, fever, infections).     Do not wear jewelry, make-up, hairpins, clips or nail polish.  Do not wear lotions, powders, or perfumes. You may wear deodorant.  Do not shave 48 hours prior to surgery. Men may shave face and neck.  Do not bring valuables to the hospital.    [facility] is not responsible for any belongings or valuables.               Contacts, dentures or bridgework may not be worn into surgery.  Leave your suitcase in the car. After surgery it may be brought to your room.  For patients admitted to the hospital, discharge time is determined by your treatment team                       Patients discharged the day of surgery will not be allowed to drive home.  You will need someone to drive you home and stay with you the night of your procedure.    Please read over the following fact sheets that you were given:   [facility] Preparing for Surgery and or MRSA Information   _x___ Take anti-hypertensive (unless it includes a diuretic), cardiac, seizure, asthma,      anti-reflux and psychiatric medicines with a sip of water. These include:  1. LEVOTHYROXINE  2.  3.  4.  5.  6.  ____Fleets enema or Magnesium Citrate as directed.   _x___ Use CHG Soap or sage wipes as directed on instruction sheet   ____ Use inhalers on the day of surgery and bring to hospital day of surgery  ____ Stop Metformin and Janumet 2 days prior to surgery.    ____ Take 1/2 of usual insulin dose the night before surgery and none on the morning     surgery.   _x___ Follow recommendations from Cardiologist, Pulmonologist or PCP regarding          stopping Aspirin, Coumadin, Pllavix ,Eliquis, Effient, or Pradaxa, and Pletal.  X____Stop Anti-inflammatories such as Advil, Aleve, Ibuprofen, Motrin, Meloxicam, Naproxen, Naprosyn, Goodies powders or aspirin products  NOW. OK to take Tylenol    _x___ Stop supplements until after surgery.  But may continue Vitamin D, Vitamin B,and multivitamin         ____ Bring C-Pap to the hospital.

## 2016-06-29 LAB — RPR: RPR Ser Ql: NONREACTIVE

## 2016-07-01 MED ORDER — CEFAZOLIN SODIUM-DEXTROSE 2-4 GM/100ML-% IV SOLN
2.0000 g | INTRAVENOUS | Status: AC
  Administered 2016-07-02: 2 g via INTRAVENOUS

## 2016-07-02 ENCOUNTER — Encounter: Payer: Self-pay | Admitting: *Deleted

## 2016-07-02 ENCOUNTER — Inpatient Hospital Stay: Payer: BLUE CROSS/BLUE SHIELD | Admitting: Anesthesiology

## 2016-07-02 ENCOUNTER — Inpatient Hospital Stay
Admission: RE | Admit: 2016-07-02 | Discharge: 2016-07-04 | DRG: 743 | Disposition: A | Discharge status: Home / Self Care | Payer: BLUE CROSS/BLUE SHIELD | Source: Ambulatory Visit | Attending: Obstetrics and Gynecology | Admitting: Obstetrics and Gynecology

## 2016-07-02 ENCOUNTER — Encounter
Admission: RE | Disposition: A | Discharge status: Home / Self Care | Payer: Self-pay | Source: Ambulatory Visit | Attending: Obstetrics and Gynecology

## 2016-07-02 DIAGNOSIS — D259 Leiomyoma of uterus, unspecified: Principal | ICD-10-CM

## 2016-07-02 DIAGNOSIS — K219 Gastro-esophageal reflux disease without esophagitis: Secondary | ICD-10-CM | POA: Diagnosis present

## 2016-07-02 DIAGNOSIS — N938 Other specified abnormal uterine and vaginal bleeding: Secondary | ICD-10-CM | POA: Diagnosis not present

## 2016-07-02 DIAGNOSIS — E039 Hypothyroidism, unspecified: Secondary | ICD-10-CM | POA: Diagnosis present

## 2016-07-02 DIAGNOSIS — N941 Unspecified dyspareunia: Secondary | ICD-10-CM | POA: Diagnosis present

## 2016-07-02 DIAGNOSIS — R102 Pelvic and perineal pain: Secondary | ICD-10-CM | POA: Diagnosis present

## 2016-07-02 DIAGNOSIS — Z90710 Acquired absence of both cervix and uterus: Secondary | ICD-10-CM

## 2016-07-02 HISTORY — PX: ABDOMINAL HYSTERECTOMY: SHX81

## 2016-07-02 LAB — ABO/RH: ABO/RH(D): O NEG

## 2016-07-02 SURGERY — HYSTERECTOMY, ABDOMINAL
Anesthesia: General | Site: Abdomen | Laterality: Bilateral | Wound class: Clean Contaminated

## 2016-07-02 MED ORDER — SUCCINYLCHOLINE CHLORIDE 20 MG/ML IJ SOLN
INTRAMUSCULAR | Status: AC
  Filled 2016-07-02: qty 1

## 2016-07-02 MED ORDER — PHENYLEPHRINE HCL 10 MG/ML IJ SOLN
INTRAMUSCULAR | Status: AC
  Filled 2016-07-02: qty 1

## 2016-07-02 MED ORDER — ACETAMINOPHEN 325 MG PO TABS: 650.0000 mg | ORAL_TABLET | ORAL | Status: DC | PRN

## 2016-07-02 MED ORDER — MORPHINE SULFATE (PF) 2 MG/ML IV SOLN
1.0000 mg | INTRAVENOUS | Status: DC | PRN
  Administered 2016-07-02 – 2016-07-03 (×7): 2 mg via INTRAVENOUS
  Filled 2016-07-02 (×7): qty 1

## 2016-07-02 MED ORDER — MIDAZOLAM HCL 2 MG/2ML IJ SOLN
INTRAMUSCULAR | Status: AC
  Filled 2016-07-02: qty 2

## 2016-07-02 MED ORDER — PROPOFOL 10 MG/ML IV BOLUS
INTRAVENOUS | Status: AC
  Filled 2016-07-02: qty 20

## 2016-07-02 MED ORDER — OXYCODONE HCL 5 MG PO TABS: 5.0000 mg | ORAL_TABLET | Freq: Once | ORAL | Status: DC | PRN

## 2016-07-02 MED ORDER — LIDOCAINE HCL (CARDIAC) 20 MG/ML IV SOLN
INTRAVENOUS | Status: DC | PRN
  Administered 2016-07-02: 100 mg via INTRAVENOUS

## 2016-07-02 MED ORDER — CEFAZOLIN SODIUM-DEXTROSE 2-4 GM/100ML-% IV SOLN
INTRAVENOUS | Status: AC
  Administered 2016-07-02: 2 g via INTRAVENOUS
  Filled 2016-07-02: qty 100

## 2016-07-02 MED ORDER — DEXAMETHASONE SODIUM PHOSPHATE 10 MG/ML IJ SOLN
INTRAMUSCULAR | Status: AC
  Filled 2016-07-02: qty 1

## 2016-07-02 MED ORDER — LIDOCAINE 5 % EX PTCH
MEDICATED_PATCH | CUTANEOUS | Status: DC | PRN
  Administered 2016-07-02: 1 via TRANSDERMAL

## 2016-07-02 MED ORDER — OXYCODONE-ACETAMINOPHEN 5-325 MG PO TABS
1.0000 | ORAL_TABLET | ORAL | Status: DC | PRN
  Administered 2016-07-03 – 2016-07-04 (×5): 2 via ORAL
  Administered 2016-07-04: 1 via ORAL
  Administered 2016-07-04: 2 via ORAL
  Filled 2016-07-02 (×7): qty 2

## 2016-07-02 MED ORDER — FENTANYL CITRATE (PF) 100 MCG/2ML IJ SOLN
INTRAMUSCULAR | Status: DC | PRN
  Administered 2016-07-02: 100 ug via INTRAVENOUS

## 2016-07-02 MED ORDER — LIDOCAINE 5 % EX PTCH
MEDICATED_PATCH | CUTANEOUS | Status: AC
  Filled 2016-07-02: qty 1

## 2016-07-02 MED ORDER — PROPOFOL 10 MG/ML IV BOLUS
INTRAVENOUS | Status: DC | PRN
  Administered 2016-07-02: 200 mg via INTRAVENOUS

## 2016-07-02 MED ORDER — LIDOCAINE HCL (PF) 2 % IJ SOLN
INTRAMUSCULAR | Status: AC
  Filled 2016-07-02: qty 2

## 2016-07-02 MED ORDER — SUCCINYLCHOLINE CHLORIDE 20 MG/ML IJ SOLN
INTRAMUSCULAR | Status: DC | PRN
  Administered 2016-07-02: 100 mg via INTRAVENOUS

## 2016-07-02 MED ORDER — FAMOTIDINE 20 MG PO TABS
20.0000 mg | ORAL_TABLET | Freq: Once | ORAL | Status: AC
  Administered 2016-07-02: 20 mg via ORAL

## 2016-07-02 MED ORDER — EPHEDRINE SULFATE 50 MG/ML IJ SOLN
INTRAMUSCULAR | Status: AC
  Filled 2016-07-02: qty 1

## 2016-07-02 MED ORDER — FAMOTIDINE 20 MG PO TABS
ORAL_TABLET | ORAL | Status: AC
  Administered 2016-07-02: 20 mg via ORAL
  Filled 2016-07-02: qty 1

## 2016-07-02 MED ORDER — ONDANSETRON HCL 4 MG/2ML IJ SOLN
INTRAMUSCULAR | Status: DC | PRN
  Administered 2016-07-02: 4 mg via INTRAVENOUS

## 2016-07-02 MED ORDER — LACTATED RINGERS IV SOLN
INTRAVENOUS | Status: DC
  Administered 2016-07-02: 07:00:00 via INTRAVENOUS

## 2016-07-02 MED ORDER — LACTATED RINGERS IV SOLN
INTRAVENOUS | Status: DC
  Administered 2016-07-02 – 2016-07-03 (×2): via INTRAVENOUS

## 2016-07-02 MED ORDER — KETOROLAC TROMETHAMINE 30 MG/ML IJ SOLN: 30.0000 mg | Freq: Four times a day (QID) | INTRAMUSCULAR | Status: DC

## 2016-07-02 MED ORDER — OXYCODONE HCL 5 MG/5ML PO SOLN: 5.0000 mg | Freq: Once | ORAL | Status: DC | PRN

## 2016-07-02 MED ORDER — FENTANYL CITRATE (PF) 100 MCG/2ML IJ SOLN
INTRAMUSCULAR | Status: AC
  Administered 2016-07-02: 25 ug via INTRAVENOUS
  Filled 2016-07-02: qty 2

## 2016-07-02 MED ORDER — KETOROLAC TROMETHAMINE 30 MG/ML IJ SOLN
30.0000 mg | Freq: Four times a day (QID) | INTRAMUSCULAR | Status: DC
  Administered 2016-07-03: 30 mg via INTRAVENOUS
  Filled 2016-07-02: qty 1

## 2016-07-02 MED ORDER — LACTATED RINGERS IV BOLUS (SEPSIS)
500.0000 mL | Freq: Once | INTRAVENOUS | Status: AC
  Administered 2016-07-02: 500 mL via INTRAVENOUS

## 2016-07-02 MED ORDER — DOCUSATE SODIUM 100 MG PO CAPS
100.0000 mg | ORAL_CAPSULE | Freq: Two times a day (BID) | ORAL | Status: DC
  Administered 2016-07-02 – 2016-07-04 (×5): 100 mg via ORAL
  Filled 2016-07-02 (×6): qty 1

## 2016-07-02 MED ORDER — ATROPINE SULFATE 0.4 MG/ML IV SOSY
PREFILLED_SYRINGE | INTRAVENOUS | Status: AC
  Filled 2016-07-02: qty 2.5

## 2016-07-02 MED ORDER — ONDANSETRON HCL 4 MG/2ML IJ SOLN
INTRAMUSCULAR | Status: AC
  Filled 2016-07-02: qty 2

## 2016-07-02 MED ORDER — SIMETHICONE 80 MG PO CHEW
80.0000 mg | CHEWABLE_TABLET | Freq: Four times a day (QID) | ORAL | Status: DC | PRN
  Administered 2016-07-03 – 2016-07-04 (×4): 80 mg via ORAL
  Filled 2016-07-02 (×4): qty 1

## 2016-07-02 MED ORDER — FENTANYL CITRATE (PF) 100 MCG/2ML IJ SOLN
INTRAMUSCULAR | Status: AC
  Filled 2016-07-02: qty 2

## 2016-07-02 MED ORDER — SUGAMMADEX SODIUM 200 MG/2ML IV SOLN
INTRAVENOUS | Status: DC | PRN
  Administered 2016-07-02: 50 mg via INTRAVENOUS

## 2016-07-02 MED ORDER — DEXAMETHASONE SODIUM PHOSPHATE 10 MG/ML IJ SOLN
INTRAMUSCULAR | Status: DC | PRN
  Administered 2016-07-02: 5 mg via INTRAVENOUS

## 2016-07-02 MED ORDER — MIDAZOLAM HCL 2 MG/2ML IJ SOLN
INTRAMUSCULAR | Status: DC | PRN
  Administered 2016-07-02: 2 mg via INTRAVENOUS

## 2016-07-02 MED ORDER — LIDOCAINE 5 % EX PTCH
1.0000 | MEDICATED_PATCH | CUTANEOUS | Status: DC
  Administered 2016-07-04: 1 via TRANSDERMAL
  Filled 2016-07-02: qty 1

## 2016-07-02 MED ORDER — ROCURONIUM BROMIDE 100 MG/10ML IV SOLN
INTRAVENOUS | Status: DC | PRN
  Administered 2016-07-02: 20 mg via INTRAVENOUS

## 2016-07-02 MED ORDER — ROCURONIUM BROMIDE 50 MG/5ML IV SOLN
INTRAVENOUS | Status: AC
  Filled 2016-07-02: qty 1

## 2016-07-02 MED ORDER — ONDANSETRON HCL 4 MG/2ML IJ SOLN
4.0000 mg | INTRAMUSCULAR | Status: DC | PRN
  Administered 2016-07-02: 4 mg via INTRAVENOUS
  Filled 2016-07-02: qty 2

## 2016-07-02 MED ORDER — KETOROLAC TROMETHAMINE 30 MG/ML IJ SOLN
INTRAMUSCULAR | Status: DC | PRN
  Administered 2016-07-02: 15 mg via INTRAVENOUS

## 2016-07-02 MED ORDER — LEVOTHYROXINE SODIUM 50 MCG PO TABS
50.0000 ug | ORAL_TABLET | Freq: Every day | ORAL | Status: DC
  Administered 2016-07-03 – 2016-07-04 (×2): 50 ug via ORAL
  Filled 2016-07-02 (×2): qty 1

## 2016-07-02 MED ORDER — KETOROLAC TROMETHAMINE 30 MG/ML IJ SOLN
30.0000 mg | Freq: Four times a day (QID) | INTRAMUSCULAR | Status: DC
  Administered 2016-07-02 (×2): 30 mg via INTRAVENOUS
  Filled 2016-07-02 (×2): qty 1

## 2016-07-02 MED ORDER — SEVOFLURANE IN SOLN
RESPIRATORY_TRACT | Status: AC
  Filled 2016-07-02: qty 250

## 2016-07-02 MED ORDER — FENTANYL CITRATE (PF) 100 MCG/2ML IJ SOLN
25.0000 ug | INTRAMUSCULAR | Status: DC | PRN
  Administered 2016-07-02 (×3): 25 ug via INTRAVENOUS

## 2016-07-02 SURGICAL SUPPLY — 37 items
CANISTER SUCT 1200ML W/VALVE (MISCELLANEOUS) ×2 IMPLANT
CATH TRAY 16F METER LATEX (MISCELLANEOUS) ×2 IMPLANT
CHLORAPREP W/TINT 26ML (MISCELLANEOUS) ×2 IMPLANT
DRAPE LAPAROTOMY 100X77 ABD (DRAPES) ×2 IMPLANT
DRAPE LAPAROTOMY TRNSV 106X77 (MISCELLANEOUS) IMPLANT
DRSG TELFA 3X8 NADH (GAUZE/BANDAGES/DRESSINGS) ×2 IMPLANT
ELECT BLADE 6 FLAT ULTRCLN (ELECTRODE) IMPLANT
ELECT BLADE 6.5 EXT (BLADE) ×2 IMPLANT
ELECT CAUTERY BLADE 6.4 (BLADE) ×2 IMPLANT
ELECT REM PT RETURN 9FT ADLT (ELECTROSURGICAL) ×2
ELECTRODE REM PT RTRN 9FT ADLT (ELECTROSURGICAL) ×1 IMPLANT
GAUZE SPONGE 4X4 12PLY STRL (GAUZE/BANDAGES/DRESSINGS) ×2 IMPLANT
GLOVE BIO SURGEON STRL SZ 6.5 (GLOVE) ×4 IMPLANT
GLOVE BIO SURGEON STRL SZ8 (GLOVE) ×4 IMPLANT
GLOVE INDICATOR 7.0 STRL GRN (GLOVE) ×2 IMPLANT
GLOVE INDICATOR 8.0 STRL GRN (GLOVE) ×2 IMPLANT
GOWN STRL REUS W/ TWL LRG LVL3 (GOWN DISPOSABLE) ×3 IMPLANT
GOWN STRL REUS W/ TWL XL LVL3 (GOWN DISPOSABLE) ×1 IMPLANT
GOWN STRL REUS W/TWL LRG LVL3 (GOWN DISPOSABLE) ×3
GOWN STRL REUS W/TWL XL LVL3 (GOWN DISPOSABLE) ×1
KIT RM TURNOVER STRD PROC AR (KITS) ×2 IMPLANT
LABEL OR SOLS (LABEL) IMPLANT
LIGASURE IMPACT 36 18CM CVD LR (INSTRUMENTS) ×2 IMPLANT
NS IRRIG 500ML POUR BTL (IV SOLUTION) ×6 IMPLANT
PACK BASIN MAJOR ARMC (MISCELLANEOUS) ×2 IMPLANT
RETRACTOR WND ALEXIS-O 25 LRG (MISCELLANEOUS) ×1 IMPLANT
RTRCTR WOUND ALEXIS O 25CM LRG (MISCELLANEOUS) ×2
STAPLER SKIN PROX 35W (STAPLE) ×2 IMPLANT
SUT CHROMIC 0 CT 1 (SUTURE) ×4 IMPLANT
SUT MAXON ABS #0 GS21 30IN (SUTURE) ×4 IMPLANT
SUT SILK 0 (SUTURE) ×2
SUT SILK 0 30XBRD TIE 6 (SUTURE) ×2 IMPLANT
SUT VIC AB 0 CT1 27 (SUTURE) ×1
SUT VIC AB 0 CT1 27XCR 8 STRN (SUTURE) ×1 IMPLANT
SUT VIC AB 2-0 SH 27 (SUTURE) ×1
SUT VIC AB 2-0 SH 27XBRD (SUTURE) ×1 IMPLANT
WATER STERILE IRR 1000ML POUR (IV SOLUTION) ×2 IMPLANT

## 2016-07-02 NOTE — Anesthesia Preprocedure Evaluation (Signed)
Anesthesia Evaluation  Patient identified by MRN, date of birth, ID band Patient awake    Reviewed: Allergy & Precautions, H&P , NPO status , Patient's Chart, lab work & pertinent test results  History of Anesthesia Complications Negative for: history of anesthetic complications  Airway Mallampati: III  TM Distance: >3 FB Neck ROM: full    Dental  (+) Poor Dentition, Chipped   Pulmonary neg shortness of breath, asthma ,    Pulmonary exam normal breath sounds clear to auscultation       Cardiovascular Exercise Tolerance: Good (-) angina(-) Past MI and (-) DOE negative cardio ROS Normal cardiovascular exam Rhythm:regular Rate:Normal     Neuro/Psych  Headaches, PSYCHIATRIC DISORDERS Anxiety Depression  Neuromuscular disease    GI/Hepatic negative GI ROS, Neg liver ROS, neg GERD  ,  Endo/Other  Hypothyroidism   Renal/GU      Musculoskeletal  (+) Arthritis ,   Abdominal   Peds  Hematology negative hematology ROS (+)   Anesthesia Other Findings Past Medical History: No date: Allergy     Comment: seasonal.  [facility]akes [facility] at [facility] No date: Anemia No date: Anxiety No date: Arthritis No date: Asthma     Comment: exercise induced--no inhalers 2010: Cancer ([place])     Comment: Basal cell skin cancer Lt eye, skin graft               above Lt eye No date: Depression No date: Dysmenorrhea No date: Headache No date: Hypothyroid     Comment: Nodules and Fine needle aspiration No date: IBS (irritable bowel syndrome) No date: Painful menstrual periods No date: Plantar fasciitis No date: Sprain of carpal (joint) of wrist  Past Surgical History: 2009: CHOLECYSTECTOMY 1974: HERNIA REPAIR     Comment: umbilical 1062: SKIN GRAFT No date: TONSILLECTOMY  BMI    Body Mass Index:  36.80 kg/m      Reproductive/Obstetrics negative OB ROS                             Anesthesia  Physical Anesthesia Plan  ASA: III  Anesthesia Plan: General ETT   Post-op Pain Management:    Induction:   Airway Management Planned:   Additional Equipment:   Intra-op Plan:   Post-operative Plan:   Informed Consent: I have reviewed the patients History and Physical, chart, labs and discussed the procedure including the risks, benefits and alternatives for the proposed anesthesia with the patient or authorized representative who has indicated his/her understanding and acceptance.   Dental Advisory Given  Plan Discussed with: Anesthesiologist, CRNA and Surgeon  Anesthesia Plan Comments:         Anesthesia Quick Evaluation

## 2016-07-02 NOTE — H&P (View-Only) (Signed)
Subjective:  PREOPERATIVE HISTORY AND PHYSICAL   Date of surgery: 07/02/2016 Procedure: TAH bilateral salpingectomy Diagnosis: 1. Uterine fibroids 2. Dysfunctional uterine bleeding 3. Pelvic pain   Patient is a 46 y.o. G0P0040fmale scheduled for TAH bilateral salpingectomy for management of symptomatic uterine fibroids.    Long history of infertility; no planning to have children in the future. Long history of dysfunctional uterine bleeding/menorrhagia and pelvic pain associated with bleeding. History of dyspareunia.  04/18/2016 ultrasound demonstrates multi-fibroid uterus with several submucosal in origin. Options of management have been reviewed including Lupron therapy, uterine artery embolization, myomectomy, and hysterectomy. Patient and spouse desire hysterectomy   Gynecologic History No LMP recorded (lmp unknown). Contraception: none Last Pap: 2015-negative Last mammogram: 2015-BI-RADS 1  OB History    Gravida Para Term Preterm AB Living   0 0 0 0 0 0   SAB TAB Ectopic Multiple Live Births   0 0 0 0 0      Patient's last menstrual period was 05/26/2016 (exact date).    Past Medical History:  Diagnosis Date  . Allergy    seasonal.  [facility]  . Anxiety   . Asthma    exercised enduced  . [place]   Basal cell skin cancer Lt eye, skin graft above Lt eye  . Depression   . Dysmenorrhea   . Hypothyroid    Nodules and Fine needle aspiration  . Painful menstrual periods   . Plantar fasciitis   . Sprain of carpal (joint) of wrist     Past Surgical History:  Procedure Laterality Date  . CHOLECYSTECTOMY  2009  . HERNIA REPAIR  15400  umbilical  . SKIN GRAFT  2010  . TONSILLECTOMY      OB History  Gravida Para Term Preterm AB Living  0 0 0 0 0 0  SAB TAB Ectopic Multiple Live Births  0 0 0 0 0        Social History   Social History  . Marital status: Married    Spouse name: N/A  . Number of children: N/A  .  Years of education: N/A   Social History Main Topics  . Smoking status: Never Smoker  . Smokeless tobacco: Never Used  . Alcohol use Yes     Comment: rare  . Drug use: No  . Sexual activity: Yes    Birth control/ protection: Pill   Other Topics Concern  . None   Social History Narrative  . None    Family History  Problem Relation Age of Onset  . Hyperlipidemia Mother   . Hypertension Mother   . Cancer Father     lung CA  . Heart disease Maternal Grandmother   . Stroke Maternal Grandmother   . Hypertension Maternal Grandmother   . Diabetes Maternal Grandmother   . Heart disease Maternal Grandfather   . Diabetes Maternal Grandfather   . Heart attack Maternal Grandfather      (Not in a hospital admission)  Allergies  Allergen Reactions  . Contrast Media [Iodinated Diagnostic Agents]   . Other Hives    Uncoded Allergy. Allergen: contrast dye    Review of Systems Constitutional: No recent fever/chills/sweats Respiratory: No recent cough/bronchitis Cardiovascular: No chest pain Gastrointestinal: No recent nausea/vomiting/diarrhea Genitourinary: No UTI symptoms Hematologic/lymphatic:No history of coagulopathy or recent blood thinner use    Objective:    BP (!) 146/74   Pulse 84   Ht 5' 8"  (1.727 m)   Wt  243 lb 12.8 oz (110.6 kg)   LMP 05/26/2016 (Exact Date)   BMI 37.07 kg/m   General:   Normal  Skin:   normal  HEENT:  Normal  Neck:  Supple without Adenopathy or Thyromegaly  Lungs:   Heart:              Breasts:   Abdomen:  Pelvis:  M/S   Extremeties:  Neuro:    clear to auscultation bilaterally   Normal without murmur   Not Examined   soft, non-tender; bowel sounds normal; no masses,  no organomegaly   Exam deferred to OR  No CVAT  Warm/Dry   Normal        05/01/2016 PELVIC:             External Genitalia: Normal             BUS: Normal             Vagina: Normal             Cervix: Normal; situated high within the vaginal vault; 1/4  cervical motion tenderness             Uterus: Top normal size; 1/4 tender; mobile; midplane             Adnexa: Normal; nonpalpable nontender             RV: Normal external exam             Bladder: Nontender  Assessment:     Symptomatic fibroid uterus-14 week size Dysfunctional uterine bleeding Chronic pelvic pain   Plan:  TAH bilateral salpingectomy   Preoperative counseling: The patient is to undergo TAH bilateral salpingectomy for symptomatic fibroid uterus-14 week size. She is understanding of the planned procedure and is aware of and is accepting of all surgical risks which include but are not limited to bleeding, infection, pelvic organ injury with need for repair, blood clot disorders, anesthesia risks, etc. All questions have been answered. Informed consent is given. Patient is ready and willing to proceed with surgery as scheduled.  Brayton Mars, MD  Note: This dictation was prepared with Dragon dictation along with smaller phrase technology. Any transcriptional errors that result from this process are unintentional.

## 2016-07-02 NOTE — Anesthesia Post-op Follow-up Note (Cosign Needed)
Anesthesia QCDR form completed.        

## 2016-07-02 NOTE — Anesthesia Postprocedure Evaluation (Signed)
Anesthesia Post Note  Patient: Caitlin Barrett  Procedure(s) Performed: Procedure(s) (LRB): HYSTERECTOMY ABDOMINAL WITH BILATERAL SALPINGECTOMY-MIDLINE INCISION (Bilateral)  Patient location during evaluation: PACU Anesthesia Type: General Level of consciousness: awake and alert Pain management: pain level controlled Vital Signs Assessment: post-procedure vital signs reviewed and stable Respiratory status: spontaneous breathing, nonlabored ventilation, respiratory function stable and patient connected to nasal cannula oxygen Cardiovascular status: blood pressure returned to baseline and stable Postop Assessment: no signs of nausea or vomiting Anesthetic complications: no     Last Vitals:  Vitals:   07/02/16 1227 07/02/16 1350  BP: 127/64 (!) 159/87  Pulse: 88 99  Resp: 18 18  Temp: 36.4 C 37.2 C    Last Pain:  Vitals:   07/02/16 1350  TempSrc: Oral  PainSc: 8                  Precious Haws Xoey Warmoth

## 2016-07-02 NOTE — Interval H&P Note (Signed)
History and Physical Interval Note:  07/02/2016 7:29 AM  Caitlin Barrett  has presented today for surgery, with the diagnosis of uterine leiomyoma, DUB, Pelvic pain  The various methods of treatment have been discussed with the patient and family. After consideration of risks, benefits and other options for treatment, the patient has consented to  Procedure(s): HYSTERECTOMY ABDOMINAL WITH BILATERAL SALPINGECTOMY-MIDLINE INCISION (Bilateral) as a surgical intervention .  The patient's history has been reviewed, patient examined, no change in status, stable for surgery.  I have reviewed the patient's chart and labs.  Questions were answered to the patient's satisfaction.     Hassell Done A Defrancesco

## 2016-07-02 NOTE — Anesthesia Procedure Notes (Signed)
Procedure Name: Intubation Date/Time: 07/02/2016 7:45 AM Performed by: Zetta Bills Pre-anesthesia Checklist: Patient identified, Emergency Drugs available, Suction available and Patient being monitored Patient Re-evaluated:Patient Re-evaluated prior to inductionOxygen Delivery Method: Circle system utilized Preoxygenation: Pre-oxygenation with 100% oxygen Intubation Type: IV induction Ventilation: Mask ventilation without difficulty Grade View: Grade I Tube type: Oral Tube size: 7.0 mm Number of attempts: 1 Airway Equipment and Method: Stylet Placement Confirmation: ETT inserted through vocal cords under direct vision,  positive ETCO2 and breath sounds checked- equal and bilateral Secured at: 22 cm Tube secured with: Tape Dental Injury: Teeth and Oropharynx as per pre-operative assessment

## 2016-07-02 NOTE — Transfer of Care (Signed)
Immediate Anesthesia Transfer of Care Note  Patient: Caitlin Barrett  Procedure(s) Performed: Procedure(s): HYSTERECTOMY ABDOMINAL WITH BILATERAL SALPINGECTOMY-MIDLINE INCISION (Bilateral)  Patient Location: PACU  Anesthesia Type:General  Level of Consciousness: awake  Airway & Oxygen Therapy: Patient connected to nasal cannula oxygen  Post-op Assessment: Report given to RN and Post -op Vital signs reviewed and stable  Post vital signs: stable  Last Vitals:  Vitals:   07/02/16 0612 07/02/16 0930  BP: (!) 144/84 140/78  Pulse: 73 84  Resp: 16 15  Temp: 36.9 C 36.4 C    Last Pain:  Vitals:   07/02/16 0930  TempSrc:   PainSc: 5          Complications: No apparent anesthesia complications  Past Medical History:  Diagnosis Date  . Allergy    seasonal.  Takes allergy shots at [facility]  . Anemia   . Anxiety   . Arthritis   . Asthma    exercise induced--no inhalers  . Cancer ([place]) 2010   Basal cell skin cancer Lt eye, skin graft above Lt eye  . Depression   . Dysmenorrhea   . Headache   . Hypothyroid    Nodules and Fine needle aspiration  . IBS (irritable bowel syndrome)   . Painful menstrual periods   . Plantar fasciitis   . Sprain of carpal (joint) of wrist    Past Surgical History:  Procedure Laterality Date  . CHOLECYSTECTOMY  2009  . HERNIA REPAIR  2574   umbilical  . SKIN GRAFT  2010  . TONSILLECTOMY     Scheduled Meds: Continuous Infusions: . lactated ringers 125 mL/hr at 07/02/16 0639   PRN Meds:.

## 2016-07-02 NOTE — Op Note (Signed)
OPERATIVE NOTE:  Caitlin Barrett PROCEDURE DATE: 07/02/2016   PREOPERATIVE DIAGNOSIS:  1. Symptomatic fibroid uterus  POSTOPERATIVE DIAGNOSIS:  1. Symptomatic fibroid uterus  PROCEDURE: TAH bilateral salpingectomy  SURGEON:  Brayton Mars, MD ASSISTANTS: Dr. Marcelline Mates ANESTHESIA: General INDICATIONS: 46 y.o. G0P0000 with 14 week size fibroid uterus presents for definitive surgical management.  FINDINGS:  12 week size fibroid uterus; normal-appearing fallopian tubes and ovaries. Ureters were out of the operative field   I/O's: Total I/O In: 600 [I.V.:600] Out: 75 [Urine:50; Blood:25] COUNTS:  YES SPECIMENS: Uterus with cervix and bilateral fallopian tubes ANTIBIOTIC PROPHYLAXIS:Ancef 2 grams COMPLICATIONS: None immediate  PROCEDURE IN DETAIL: Patient was brought to the operating room placed in supine position. General endotracheal anesthesia was induced without difficulty. A ChloraPrep and Hibiclens abdominal perineal intravaginal prep and drape was performed in standard fashion. A Foley catheter was placed and was draining clear yellow urine. Timeout was completed. Midline incision was made from the subumbilical region to within 2 fingerbreadths above the symphysis pubis. The fascia was incised and extended superiorly and inferiorly with Mayo scissors. Peritoneal cavity was entered. Explortion of the abdominal pelvic cavity revealed a 12 week size fibroid uterus with normal fallopian tubes and ovaries. Upper abdomen was normal. The bowel was packed off with wet laps. The Alexis retractor, large, was used to facilitate visualization. Double-tooth tenaculum was placed on the fundus of the uterus. The LigaSure impact was then utilized to perform hysterectomy. Sequentially the mesosalpinx was clamped coagulated and cut. The fallopian tubes were transected and excised from the operative field. The round ligaments were clamped coagulated and cut. The utero-ovarian ligaments were clamped  coagulated and cut. Sequentially the cardinal broad ligament complexes were taken down with the LigaSure instrument bilaterally. This was down to the level of the uterosacral ligament. The uterosacral ligaments were likewise taken down with the LigaSure impact. Once assured that the bladder was dissected off lower uterine segment and cervix, the margins of the cervicovaginal junction were crossclamped with curved Heaney clamps. The uterus and cervix was then excised from the operative field using Mayo scissors. The angles of vagina were closed with a Richardson stitch of 0 Vicryl. The intervening cuff was closed using figure-of-eight sutures of 0 Vicryl. Excellent hemostasis was noted. The pelvis was copiously irrigated with saline. The irrigant fluid was aspirated. Inspection of all pedicles revealed excellent hemostasis.` The abdomen was then closed in layers, using 0 Maxon on the fascia in a simple running manner; using 2-0 Vicryl on the subcutaneous tissues; The skin was closed with staples. A Lidoderm patch was applied for analgesia. Pressure dressing was placed. The patient was then awakened and taken to recovery in satisfactory condition.  Martin A. Zipporah Plants, MD, ACO[facility]

## 2016-07-03 LAB — SURGICAL PATHOLOGY

## 2016-07-03 LAB — HEMOGLOBIN: HEMOGLOBIN: 11.8 g/dL — AB (ref 12.0–16.0)

## 2016-07-03 MED ORDER — IBUPROFEN 600 MG PO TABS
600.0000 mg | ORAL_TABLET | Freq: Four times a day (QID) | ORAL | Status: DC | PRN
  Administered 2016-07-03 – 2016-07-04 (×5): 600 mg via ORAL
  Filled 2016-07-03 (×5): qty 1

## 2016-07-03 NOTE — Progress Notes (Signed)
1 Day Post-Op Procedure(s) (LRB): HYSTERECTOMY ABDOMINAL WITH BILATERAL SALPINGECTOMY-MIDLINE INCISION (Bilateral)  Subjective: Patient reports tolerating PO and no problems voiding.    Objective: I have reviewed patient's vital signs, intake and output, medications and labs. BP 135/64   Pulse 78   Temp 98.4 F (36.9 C) (Oral)   Resp 18   Ht 5' 8"  (1.727 m)   Wt 242 lb (109.8 kg)   SpO2 93%   BMI 36.80 kg/m   General: alert, cooperative and no distress Resp: Breathing unlabored; Pulse Ox 93 Cardio: regular rate and rhythm GI: soft, non-tender; bowel sounds normal; no masses,  no organomegaly Extremities: extremities normal, atraumatic, no cyanosis or edema Vaginal Bleeding: none  CBC Latest Ref Rng & Units 07/03/2016 06/28/2016 04/18/2016  WBC 3.6 - 11.0 K/uL - 9.0 8.2  Hemoglobin 12.0 - 16.0 g/dL 11.8(L) 13.8 -  Hematocrit 35.0 - 47.0 % - 40.4 37.7  Platelets 150 - 440 K/uL - 380 344   Assessment: s/p Procedure(s): HYSTERECTOMY ABDOMINAL WITH BILATERAL SALPINGECTOMY-MIDLINE INCISION (Bilateral): progressing well  Plan: Advance diet Encourage ambulation Advance to PO medication D/C foley  LOS: 1 day    Caitlin Barrett 07/03/2016, 7:47 AM

## 2016-07-04 MED ORDER — OXYCODONE-ACETAMINOPHEN 5-325 MG PO TABS
1.0000 | ORAL_TABLET | ORAL | 0 refills | Status: DC | PRN
Start: 2016-07-04 — End: 2016-07-17

## 2016-07-04 MED ORDER — DOCUSATE SODIUM 100 MG PO CAPS: 100.0000 mg | ORAL_CAPSULE | Freq: Two times a day (BID) | ORAL | 0 refills | Status: DC

## 2016-07-04 MED ORDER — IBUPROFEN 600 MG PO TABS: 600.0000 mg | ORAL_TABLET | Freq: Four times a day (QID) | ORAL | 0 refills | Status: DC | PRN

## 2016-07-04 NOTE — Progress Notes (Signed)
Patient discharged home. Discharge instructions, prescriptions and follow up appointment given to and reviewed with patient. Patient verbalized understanding. Escorted out via wheelchair by [employer], NT.

## 2016-07-04 NOTE — Discharge Summary (Signed)
Physician Discharge Summary  Patient ID: HANH KERTESZ MRN: [id number] DOB/AGE: 04/05/1970 46 y.o.  Admit date: 07/02/2016 Discharge date: 07/04/2016  Admission Diagnoses: Fibroids  Discharge Diagnoses:  Fibroids  Operative Procedures: Procedure(s): HYSTERECTOMY ABDOMINAL WITH BILATERAL SALPINGECTOMY-MIDLINE INCISION (Bilateral)  Hospital Course: Uncomplicated.   Significant Diagnostic Studies:  Lab Results  Component Value Date   HGB 11.8 (L) 07/03/2016   HGB 13.8 06/28/2016   Lab Results  Component Value Date   HCT 40.4 06/28/2016   HCT 37.7 04/18/2016   CBC Latest Ref Rng & Units 07/03/2016 06/28/2016 04/18/2016  WBC 3.6 - 11.0 K/uL - 9.0 8.2  Hemoglobin 12.0 - 16.0 g/dL 11.8(L) 13.8 -  Hematocrit 35.0 - 47.0 % - 40.4 37.7  Platelets 150 - 440 K/uL - 380 344     Discharged Condition: good  Discharge Exam: Blood pressure (!) 143/65, pulse 90, temperature 98 F (36.7 C), temperature source Oral, resp. rate 18, height 5' 8"  (1.727 m), weight 242 lb (109.8 kg), SpO2 98 %. Incision/Wound: dry, no drainage and staples intact  Disposition: Final discharge-to home care  Discharge Instructions    Discharge patient    Complete by:  As directed    Discharge disposition:  01-Home or Self Care   Discharge patient date:  07/04/2016     Allergies as of 07/04/2016      Reactions   Contrast Media [iodinated Diagnostic Agents]    Other Hives   Uncoded Allergy. Allergen: contrast dye      Medication List    STOP taking these medications   meloxicam 15 MG tablet Commonly known as:  MOBIC   naproxen 500 MG tablet Commonly known as:  NAPROSYN   norethindrone 0.35 MG tablet Commonly known as:  MICRONOR,CAMILA,ERRIN     TAKE these medications   Chlorphen-Phenyleph-APAP 2-5-325 MG Tabs Take 1 tablet by mouth 2 (two) times daily.   docusate sodium 100 MG capsule Commonly known as:  COLACE Take 1 capsule (100 mg total) by mouth 2 (two) times daily.   EPINEPHrine 0.3  mg/0.3 mL Soaj injection Commonly known as:  EPI-PEN 0.3 mg as needed.   ibuprofen 600 MG tablet Commonly known as:  ADVIL,MOTRIN Take 1 tablet (600 mg total) by mouth every 6 (six) hours as needed for fever or headache. What changed:  medication strength  how much to take  when to take this  reasons to take this   ipratropium 0.06 % nasal spray Commonly known as:  ATROVENT Place 2 sprays into both nostrils 4 (four) times daily. For up to 5-7 days then stop.   levothyroxine 50 MCG tablet Commonly known as:  SYNTHROID, LEVOTHROID Take 1 tablet by mouth daily before breakfast.   oxyCODONE-acetaminophen 5-325 MG tablet Commonly known as:  PERCOCET/ROXICET Take 1-2 tablets by mouth every 4 (four) hours as needed (moderate to severe pain (when tolerating fluids)).      Follow-up Information    Brayton Mars, MD. Go in 1 week(s).   Specialties:  Obstetrics and Gynecology, Radiology Why:  Post Op Check Contact information: [address] [place] [address] [phone]           Signed: Alanda Slim Andres Vest 07/04/2016, 4:16 PM

## 2016-07-06 ENCOUNTER — Telehealth: Payer: Self-pay | Admitting: Obstetrics and Gynecology

## 2016-07-06 ENCOUNTER — Encounter: Payer: Self-pay | Admitting: Certified Nurse Midwife

## 2016-07-06 ENCOUNTER — Ambulatory Visit (INDEPENDENT_AMBULATORY_CARE_PROVIDER_SITE_OTHER): Payer: BLUE CROSS/BLUE SHIELD | Admitting: Certified Nurse Midwife

## 2016-07-06 VITALS — BP 127/81 | HR 107 | Temp 99.7°F | Ht 68.0 in | Wt 248.6 lb

## 2016-07-06 DIAGNOSIS — Z20818 Contact with and (suspected) exposure to other bacterial communicable diseases: Secondary | ICD-10-CM | POA: Diagnosis not present

## 2016-07-06 DIAGNOSIS — R309 Painful micturition, unspecified: Secondary | ICD-10-CM

## 2016-07-06 LAB — POCT URINALYSIS DIPSTICK
Bilirubin, UA: NEGATIVE
Glucose, UA: NEGATIVE
KETONES UA: NEGATIVE
Nitrite, UA: NEGATIVE
PH UA: 8 (ref 5.0–8.0)
PROTEIN UA: NEGATIVE
Urobilinogen, UA: NEGATIVE E.U./dL — AB

## 2016-07-06 MED ORDER — NITROFURANTOIN MONOHYD MACRO 100 MG PO CAPS: 100.0000 mg | ORAL_CAPSULE | Freq: Two times a day (BID) | ORAL | 0 refills | Status: DC

## 2016-07-06 NOTE — Telephone Encounter (Signed)
Pt seen by [facility] today.

## 2016-07-06 NOTE — Telephone Encounter (Signed)
Patient called with nausea and fever she had surgery Monday 07/02/2016   She's having back pain and no bowel movement since the day of surgery Please call [phone]

## 2016-07-08 LAB — URINE CULTURE

## 2016-07-08 NOTE — Progress Notes (Signed)
Post-Operative Problem visit: Patient of Dr. Enzo Bi her for work in problem visit. Patient is 4 days status post abdominal hysterectomy with bilateral salpingectomy. The patient reports fever, to 101 degrees F, nausea and low back pain with pressure with urination. Pain is controlled with current analgesics. Medications being used: Percocet and Motrin.  Subjective:   Pt woke this morning and felt warm. Temperature 101 F oral and nausea after taking medications.  She has felt better since taking Motrin at 0900 and one (1) percocet at 1130.   She reports congestion, but contributes that symptom to her allergies. She has not taken any allergy medication during her recovery period.   She is eating and drinking without difficulty. She had a bowel movement this morning.   Denies difficulty breathing or respiratory distress, chest pain, vaginal bleeding, and leg pain or swelling.   Pt was visited by her niece during her hospital stay who was later diagnosed with Strep throat.   Review of Systems:  ROS negative except as noted above. Information obtained from patient and spouse.   Objective:   BP 127/81   Pulse (!) 107   Temp 99.7 F (37.6 C)   Ht 5' 8"  (1.727 m)   Wt 248 lb 9.6 oz (112.8 kg)   LMP 05/26/2016 (Exact Date)   BMI 37.80 kg/m   General: Alert and oriented x 4, no apparent distress  Cardiovascular: Regular heart rate and rhythm auscultated  Respiratory: Lungs clear to auscultation bilaterally  Abdomen: soft, round, non-tender; incision well approximated without red streaks or drainage; No CVAT  Musculoskeletal: Normal range of motion. No tenderness.  No cyanosis, clubbing, or edema.  Rapid strep negative  Assessment:   Pressure with urination  Low grade fever post op  Exposure to Strep A  Plan:  Urine dip and culture, will contact pt with results  Rx Macrobid, see orders  Continue post op care as previously instructed  Reviewed red flag symptoms  and when to call  RTC for post op appointment as previously scheduled   Diona Fanti, CNM

## 2016-07-09 LAB — POCT RAPID STREP A (OFFICE): Rapid Strep A Screen: NEGATIVE

## 2016-07-09 NOTE — Progress Notes (Signed)
Urine negative.

## 2016-07-10 ENCOUNTER — Encounter: Payer: Self-pay | Admitting: Obstetrics and Gynecology

## 2016-07-10 ENCOUNTER — Ambulatory Visit (INDEPENDENT_AMBULATORY_CARE_PROVIDER_SITE_OTHER): Payer: BLUE CROSS/BLUE SHIELD | Admitting: Obstetrics and Gynecology

## 2016-07-10 VITALS — BP 159/83 | HR 84 | Temp 98.1°F | Ht 68.0 in | Wt 243.8 lb

## 2016-07-10 DIAGNOSIS — R232 Flushing: Secondary | ICD-10-CM

## 2016-07-10 DIAGNOSIS — R351 Nocturia: Secondary | ICD-10-CM

## 2016-07-10 DIAGNOSIS — Z09 Encounter for follow-up examination after completed treatment for conditions other than malignant neoplasm: Secondary | ICD-10-CM

## 2016-07-10 DIAGNOSIS — Z4802 Encounter for removal of sutures: Secondary | ICD-10-CM

## 2016-07-10 NOTE — Patient Instructions (Signed)
1. Complete course of Macrobid as written 2. Repeat urine culture is obtained today 3. Return in 1 week for wound check 4. Staples are removed today and early strips are applied.

## 2016-07-10 NOTE — Progress Notes (Signed)
Chief complaint: 1. 10 day postop check 2. Status post TAH bilateral salpingectomy  Patient presents for follow-up after surgery. She was seen 4 days ago with UTI symptoms and was placed on Macrobid; subsequent culture was negative for infection; patient continues to have urinary frequency and urgency. Patient is experiencing vasomotor symptoms in the form of hot flashes and sweats. She did have 1 elevated temperature or days ago above 100.69F.  Patient denies upper respiratory symptoms. She denies sinus discomfort. She denies cough. Patient denies lower abdominal pain, nausea, vomiting, and diarrhea. Patient reports some greenish discharge from the vagina.  Past medical history: Past surgical history, problem list, medications, and allergies are reviewed  OBJECTIVE: BP (!) 159/83   Pulse 84   Temp 98.1 F (36.7 C)   Ht 5' 8"  (1.727 m)   Wt 243 lb 12.8 oz (110.6 kg)   LMP 05/26/2016 (Exact Date)   BMI 37.07 kg/m  Pleasant female in no acute distress. Alert and oriented. Lungs: Clear Heart: Regular rate and rhythm without murmur Abdomen: Soft, nontender; bowel sounds present; midline incision is well approximated with staples, without erythema or incisional drainage or induration Pelvic: External genitalia normal BUS-normal Vagina-minimal secretions in vaginal vault; no palpable mass or tenderness Rectovaginal-normal external exam  ASSESSMENT: 1. 10 days status post TAH bilateral salpingectomy 2. Vasomotor symptoms 3. UTI symptoms with negative recent urine culture  PLAN: 1. Complete Macrobid course as prescribed 2. Repeat urinalysis and urine culture 3. Staples removed; Steri-Strips applied 4. Return in 1 week for follow-up  Brayton Mars, MD  Note: This dictation was prepared with Dragon dictation along with smaller phrase technology. Any transcriptional errors that result from this process are unintentional.

## 2016-07-12 ENCOUNTER — Encounter: Payer: Self-pay | Admitting: Obstetrics and Gynecology

## 2016-07-12 LAB — URINE CULTURE

## 2016-07-17 ENCOUNTER — Encounter: Payer: Self-pay | Admitting: Obstetrics and Gynecology

## 2016-07-17 ENCOUNTER — Ambulatory Visit (INDEPENDENT_AMBULATORY_CARE_PROVIDER_SITE_OTHER): Payer: BLUE CROSS/BLUE SHIELD | Admitting: Obstetrics and Gynecology

## 2016-07-17 VITALS — BP 128/84 | HR 72 | Ht 68.0 in | Wt 239.6 lb

## 2016-07-17 DIAGNOSIS — Z09 Encounter for follow-up examination after completed treatment for conditions other than malignant neoplasm: Secondary | ICD-10-CM

## 2016-07-17 NOTE — Progress Notes (Signed)
Chief complaint: 1. Incision check  Patient presents for incision check 2 weeks status post TAH bilateral salpingectomy for symptomatic leiomyoma. Patient is having normal bowel and bladder function. She does state that she still has a persistent intermittent discharge as noted last time. No fever, chills, sweats. No UTI symptoms. Bowel function is normal. She does note some tugging on the left side of her abdomen at various times during the day. Incision has remained clean and dry without evidence of drainage were bleeding.  OBJECTIVE: BP 128/84   Pulse 72   Ht 5' 8"  (1.727 m)   Wt 239 lb 9.6 oz (108.7 kg)   LMP 05/26/2016 (Exact Date)   BMI 36.43 kg/m  Pleasant female in no acute distress Abdomen: Midline incision is well approximated with Steri-Strips; Steri-Strips are removed; no evidence of hernia, induration, discharge Pelvic: Deferred  ASSESSMENT: 1. Status post TAH bilateral salpingectomy through a midline incision for symptomatic leiomyoma 2. Normal incision check  PLAN: 1. Increase activities as tolerated 2. Return in 4 weeks for final postop check  Brayton Mars, MD  Note: This dictation was prepared with Dragon dictation along with smaller phrase technology. Any transcriptional errors that result from this process are unintentional.

## 2016-07-17 NOTE — Patient Instructions (Signed)
1. Continue with routine postoperative precautions. 2. Increase activity as tolerated 3. Return in 4 weeks for final postop check

## 2016-07-27 ENCOUNTER — Encounter: Payer: Self-pay | Admitting: Obstetrics and Gynecology

## 2016-08-01 ENCOUNTER — Ambulatory Visit (INDEPENDENT_AMBULATORY_CARE_PROVIDER_SITE_OTHER): Payer: BLUE CROSS/BLUE SHIELD | Admitting: Family Medicine

## 2016-08-01 ENCOUNTER — Encounter: Payer: Self-pay | Admitting: Family Medicine

## 2016-08-01 VITALS — BP 134/62 | HR 72 | Temp 98.2°F | Resp 16 | Ht 69.0 in | Wt 243.0 lb

## 2016-08-01 DIAGNOSIS — Z90710 Acquired absence of both cervix and uterus: Secondary | ICD-10-CM | POA: Diagnosis not present

## 2016-08-01 DIAGNOSIS — N3001 Acute cystitis with hematuria: Secondary | ICD-10-CM | POA: Diagnosis not present

## 2016-08-01 LAB — POCT URINALYSIS DIPSTICK
Bilirubin, UA: NEGATIVE
Glucose, UA: NEGATIVE
KETONES UA: NEGATIVE
Nitrite, UA: NEGATIVE
PH UA: 7 (ref 5.0–8.0)
PROTEIN UA: NEGATIVE
Spec Grav, UA: 1.01 (ref 1.010–1.025)
UROBILINOGEN UA: 0.2 U/dL

## 2016-08-01 MED ORDER — CIPROFLOXACIN HCL 500 MG PO TABS: 500.0000 mg | ORAL_TABLET | Freq: Two times a day (BID) | ORAL | 0 refills | Status: DC

## 2016-08-01 NOTE — Patient Instructions (Signed)
Thank you for coming to the clinic today.  1. You most likely have a Urinary Tract Infection - this is very common, your symptoms are reassuring and you should get better within 1 week on the antibiotics - Start Cipro 549m 2 times daily for next 5 days, complete entire course, even if feeling better - We sent urine for a culture, we will call you within next few days if we need to change antibiotics - Please drink plenty of fluids, improve hydration over next 1 week  If symptoms worsening, developing nausea / vomiting, worsening back pain, fevers / chills / sweats, then please return for re-evaluation sooner.  Drink enough water to keep your pee clear to pale yellow  If you think you are getting another bladder infection, start drinking cranberry juice and come see [place] we can check the urine.  Please schedule a follow-up appointment with Dr. KParks Rangeras needed for UTI if not improved 1-2 weeks  If you have any other questions or concerns, please feel free to call the clinic or send a message through [facility] You may also schedule an earlier appointment if necessary.  ANobie Putnam DO [facility]

## 2016-08-01 NOTE — Assessment & Plan Note (Signed)
Unlikely to be related to current episode of acute cystitis, however concern that patient has had x 2 cystitis in 1 month following her TAH b/l salpingectomy surgery. - Encourage patient to follow-up closely with her GYN to ensure that this resolves and post-op checks are normal

## 2016-08-01 NOTE — Progress Notes (Signed)
Subjective:    Patient ID: Caitlin Barrett, female    DOB: Jan 25, 1971, 46 y.o.   MRN: [id number]  Caitlin Barrett is a 46 y.o. female presenting on 08/01/2016 for Urinary Tract Infection (Patient here today C/O painful urination, lower back pain and pressure with urination. )  Patient presents for a same day appointment.  HPI  ACUTE CYSTITIS / History of recent Hysterectomy - Recent history on 07/02/16 with hysterectomy (complete removal of cervix, uterus, and fallopian tubes, has bilateral ovaries in place), had a urinary foley catheter in place overnight post-op, and improved, then when discharged had worsening urinary frequency, bladder pressure and felt mild fever, she returned to GYN and had evaluation, with UA showed trace Leuks and trace hemoglobin, had Urine Culture that was negative (< 10k CFU normal flora), treated with Macrobid with complete resolution of symptoms. - Now returns today for same concern about 1 month later with recurrence of similar symptoms started about 2 days ago with nearly identical UTI symptoms of urinary frequency, bladder pressure, dysuria - Her next post-op GYN 6 wk follow-up is on 08/14/16 - Only had 2 other prior UTI in her past, not frequent, Cipro has worked in past - Admits incomplete bladder emptying - Admits some mid low back pain, intermittent - Denies any fevers/chills, sweats, nausea, vomiting, abdominal pain  Social History  Substance Use Topics  . Smoking status: Never Smoker  . Smokeless tobacco: Never Used  . Alcohol use Yes     Comment: rare    Review of Systems Per HPI unless specifically indicated above     Objective:    BP 134/62 (BP Location: Left Arm, Patient Position: Sitting, Cuff Size: Large)   Pulse 72   Temp 98.2 F (36.8 C) (Oral)   Resp 16   Ht 5\' 9"  (1.753 m)   Wt 243 lb (110.2 kg)   LMP 05/26/2016 (Exact Date)   SpO2 100%   BMI 35.88 kg/m   Wt Readings from Last 3 Encounters:  08/01/16 243 lb (110.2 kg)    07/17/16 239 lb 9.6 oz (108.7 kg)  07/10/16 243 lb 12.8 oz (110.6 kg)    Physical Exam  Constitutional: She is oriented to person, place, and time. She appears well-developed and well-nourished. No distress.  Well-appearing, comfortable, cooperative  HENT:  Head: Normocephalic and atraumatic.  Mouth/Throat: Oropharynx is clear and moist.  Eyes: Conjunctivae are normal.  Cardiovascular: Normal rate, regular rhythm, normal heart sounds and intact distal pulses.   No murmur heard. Pulmonary/Chest: Effort normal and breath sounds normal. No respiratory distress. She has no wheezes. She has no rales.  Abdominal: Soft. Bowel sounds are normal. She exhibits no distension and no mass. There is no tenderness. There is no rebound.  Mild discomfort suprapubic R>L. No palpable bladder fullness  Musculoskeletal: She exhibits no edema.  Low Back Inspection: Normal appearance, no spinal deformity, symmetrical. Palpation: No tenderness over spinous processes. Mild lower lumbar bilateral paraspinal muscles non tender but this is area of discomfort, non reproducible. Some mild spasm. ROM: Full active ROM forward flex / back extension, rotation L/R without discomfort Special Testing: Seated SLR negative for radicular pain bilaterally  Strength: intact Neurovascular: intact distal sensation to light touch  Neurological: She is alert and oriented to person, place, and time.  Skin: Skin is warm and dry. No rash noted. She is not diaphoretic. No erythema.  Psychiatric: She has a normal mood and affect. Her behavior is normal.  Nursing note and vitals  reviewed.  Results for orders placed or performed in visit on 08/01/16  POCT urinalysis dipstick  Result Value Ref Range   Color, UA yellow    Clarity, UA clear    Glucose, UA negative    Bilirubin, UA negative    Ketones, UA negative    Spec Grav, UA 1.010 1.010 - 1.025   Blood, UA tr(Hem)    pH, UA 7.0 5.0 - 8.0   Protein, UA negative     Urobilinogen, UA 0.2 0.2 or 1.0 E.U./dL   Nitrite, UA negative    Leukocytes, UA Small (1+) (A) Negative      Assessment & Plan:   Problem List Items Addressed This Visit    S/P TAH (total abdominal hysterectomy)    Unlikely to be related to current episode of acute cystitis, however concern that patient has had x 2 cystitis in 1 month following her TAH b/l salpingectomy surgery. - Encourage patient to follow-up closely with her GYN to ensure that this resolves and post-op checks are normal       Other Visit Diagnoses    Acute cystitis with hematuria    -  Primary  Clinically consistent with cystitis / UTI and with suggestive UA. Concern with recent similar UTI 1 month ago s/p Macrobid with resolution, she was s/p post-op TAH and had foley in >24 hours. - No concern for pyelo today (no systemic symptoms, neg fever, back pain, n/v).  Plan: 1. UA - trace Hgb and Sm leuks (updated UA results since entered initially as Negative leukocytes despite handwritten results flagged as small leuks) 2. Ordered Urine culture 3. Start empiric antibiotic Cipro 500mg  BID for 5 days - since now recurrent cystitis in 1 month 4. Improve PO hydration 5. RTC if no improvement 1-2 weeks, red flags given to return sooner     Relevant Medications   ciprofloxacin (CIPRO) 500 MG tablet   Other Relevant Orders   POCT urinalysis dipstick (Completed)   Urine culture      Meds ordered this encounter  Medications  . ciprofloxacin (CIPRO) 500 MG tablet    Sig: Take 1 tablet (500 mg total) by mouth 2 (two) times daily. For 5 days    Dispense:  10 tablet    Refill:  0      Follow up plan: Return in about 2 weeks (around 08/15/2016), or if symptoms worsen or fail to improve, for UTI .  Saralyn Pilar, DO [facility], [facility] 08/01/2016, 10:37 PM

## 2016-08-02 ENCOUNTER — Encounter: Payer: Self-pay | Admitting: Obstetrics and Gynecology

## 2016-08-02 LAB — URINE CULTURE

## 2016-08-14 ENCOUNTER — Encounter: Payer: Self-pay | Admitting: Obstetrics and Gynecology

## 2016-08-14 ENCOUNTER — Ambulatory Visit (INDEPENDENT_AMBULATORY_CARE_PROVIDER_SITE_OTHER): Payer: BLUE CROSS/BLUE SHIELD | Admitting: Obstetrics and Gynecology

## 2016-08-14 VITALS — BP 124/87 | HR 69 | Ht 69.0 in | Wt 244.8 lb

## 2016-08-14 DIAGNOSIS — N938 Other specified abnormal uterine and vaginal bleeding: Secondary | ICD-10-CM

## 2016-08-14 DIAGNOSIS — Z90710 Acquired absence of both cervix and uterus: Secondary | ICD-10-CM

## 2016-08-14 DIAGNOSIS — D259 Leiomyoma of uterus, unspecified: Secondary | ICD-10-CM

## 2016-08-14 DIAGNOSIS — Z09 Encounter for follow-up examination after completed treatment for conditions other than malignant neoplasm: Secondary | ICD-10-CM

## 2016-08-14 DIAGNOSIS — R102 Pelvic and perineal pain: Secondary | ICD-10-CM

## 2016-08-14 LAB — POCT URINALYSIS DIPSTICK
BILIRUBIN UA: NEGATIVE
GLUCOSE UA: NEGATIVE
KETONES UA: NEGATIVE
Leukocytes, UA: NEGATIVE
Nitrite, UA: NEGATIVE
Protein, UA: NEGATIVE
SPEC GRAV UA: 1.02 (ref 1.010–1.025)
UROBILINOGEN UA: 0.2 U/dL
pH, UA: 6 (ref 5.0–8.0)

## 2016-08-14 NOTE — Progress Notes (Signed)
Chief complaint: 1. Final postop check 2. Status post TAH bilateral salpingectomy  Patient reports normal bowel function. She is experiencing some pelvic pressure with urination without dysuria. Urinalysis shows slight blood today and urine culture is sent to rule out UTI. She is not experiencing any significant pelvic pain requiring analgesics. She is not experiencing any vaginal discharge or vaginal bleeding.  PATHOLOGY: DIAGNOSIS:  A. 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  - WEAKLY PROLIFERATIVE ENDOMETRIUM.  - MYOMETRIUM WITH LEIOMYOMATA, LARGEST MEASURING 3 CM.  - BILATERAL FALLOPIAN TUBES WITH FIMBRIATED END.  - BENIGN PARATUBAL CYST.  - NEGATIVE FOR ATYPIA AND MALIGNANCY.   OBJECTIVE: BP 124/87   Pulse 69   Ht 5' 9"  (1.753 m)   Wt 244 lb 12.8 oz (111 kg)   LMP 05/26/2016 (Exact Date)   BMI 36.15 kg/m  Pleasant female in no acute distress Abdomen: Soft, nontender; midline incision is well approximated without evidence of hernia Pelvic: External genitalia-normal BUS-normal Vagina-good vaginal vault support; cuff intact; no discharge Cervix-surgically absent Uterus-surgically absent Adnexa-surgically absent Bimanual-no palpable masses or tenderness Rectovaginal-normal external exam  ASSESSMENT: 1. Status post TAH bilateral salpingectomy for symptomatic fibroid uterus-normal 6 week postop check 2. Pelvic pressure symptoms: Cannot rule out UTI  PLAN: 1. Urine culture 2. Resume activities as tolerated 3. Return as needed for any gynecologic issues 4. Keep annual appointments or physicals at [place], [place]  Note: This dictation was prepared with Dragon dictation along with smaller phrase technology. Any transcriptional errors that result from this process are unintentional.

## 2016-08-14 NOTE — Patient Instructions (Addendum)
1. Resume all activities without restriction 2. Return as needed for any gynecologic issues 3. Keep appointments for routine physical's at [facility]

## 2016-08-16 LAB — URINE CULTURE: ORGANISM ID, BACTERIA: NO GROWTH

## 2016-10-01 ENCOUNTER — Ambulatory Visit (INDEPENDENT_AMBULATORY_CARE_PROVIDER_SITE_OTHER): Payer: BLUE CROSS/BLUE SHIELD | Admitting: Nurse Practitioner

## 2016-10-01 ENCOUNTER — Encounter: Payer: Self-pay | Admitting: Nurse Practitioner

## 2016-10-01 VITALS — BP 119/60 | HR 67 | Temp 98.2°F | Ht 69.0 in | Wt 253.0 lb

## 2016-10-01 DIAGNOSIS — Z0001 Encounter for general adult medical examination with abnormal findings: Secondary | ICD-10-CM

## 2016-10-01 DIAGNOSIS — M6289 Other specified disorders of muscle: Secondary | ICD-10-CM | POA: Diagnosis not present

## 2016-10-01 DIAGNOSIS — N3941 Urge incontinence: Secondary | ICD-10-CM

## 2016-10-01 DIAGNOSIS — N644 Mastodynia: Secondary | ICD-10-CM

## 2016-10-01 DIAGNOSIS — N941 Unspecified dyspareunia: Secondary | ICD-10-CM

## 2016-10-01 DIAGNOSIS — B373 Candidiasis of vulva and vagina: Secondary | ICD-10-CM

## 2016-10-01 DIAGNOSIS — B3731 Acute candidiasis of vulva and vagina: Secondary | ICD-10-CM

## 2016-10-01 DIAGNOSIS — R350 Frequency of micturition: Secondary | ICD-10-CM | POA: Diagnosis not present

## 2016-10-01 DIAGNOSIS — Z0000 Encounter for general adult medical examination without abnormal findings: Secondary | ICD-10-CM

## 2016-10-01 NOTE — Patient Instructions (Addendum)
Cerys, Thank you for coming in to clinic today.  1. For your urination and painful intercourse: - START PT for pelvic floor - At home prior to visit, do a few kegel exercises to identify which muscles control your pelvic floor.  2. For your yeast infection: - Take one diflucan tablet once today.  3. For your depression: - Resume your activity.  - Follow up here in 3-4 weeks if no improvement or sooner if worsening.  Otherwise, normal exam today. - You will be due for FASTING BLOOD WORK (no food or drink after midnight before, only water or coffee without cream/sugar on the morning of)  - Please go ahead and schedule a "Lab Only" visit in the morning at the clinic for lab draw in  The next 7 days for your Annual Physical  For Lab Results, once available within 2-3 days of blood draw, you can can log in to MyChart online to view your results and a brief explanation. Also, we can discuss results at next follow-up visit.  Please schedule a follow-up appointment with Cassell Smiles, AGNP to Return in about 1 year (around 10/01/2017) for annual physical.  If you have any other questions or concerns, please feel free to call the clinic or send a message through [place]. You may also schedule an earlier appointment if necessary.  Cassell Smiles, DNP, AGNP-BC Adult Gerontology Nurse Practitioner [facility], [facility]    Kegel Exercises Kegel exercises help strengthen the muscles that support the rectum, vagina, small intestine, bladder, and uterus. Doing Kegel exercises can help:  Improve bladder and bowel control.  Improve sexual response.  Reduce problems and discomfort during pregnancy.  Kegel exercises involve squeezing your pelvic floor muscles, which are the same muscles you squeeze when you try to stop the flow of urine. The exercises can be done while sitting, standing, or lying down, but it is best to vary your position. Phase 1 exercises 1. Squeeze your pelvic  floor muscles tight. You should feel a tight lift in your rectal area. If you are a female, you should also feel a tightness in your vaginal area. Keep your stomach, buttocks, and legs relaxed. 2. Hold the muscles tight for up to 10 seconds. 3. Relax your muscles. Repeat this exercise 50 times a day or as many times as told by your health care provider. Continue to do this exercise for at least 4-6 weeks or for as long as told by your health care provider. This information is not intended to replace advice given to you by your health care provider. Make sure you discuss any questions you have with your health care provider. Document Released: 02/27/2012 Document Revised: 11/05/2015 Document Reviewed: 01/30/2015 Elsevier Interactive Patient Education  [employer].

## 2016-10-01 NOTE — Progress Notes (Signed)
Subjective:    Patient ID: Caitlin Barrett, female    DOB: November 29, 1970, 46 y.o.   MRN: [id number]  Caitlin Barrett is a 46 y.o. female presenting on 10/01/2016 for Annual Exam   HPI Annual Physical Exam Patient has been feeling well.  They have some acute concerns today.   Pt recently S/P hysterectomy 07/02/2016 w/ adherence to GYN post op f/u: - urinary frequency / urgency and UTI after hysterectomy w/ repeated symptoms after antibiotic.  After antibiotic pt has less burning and notes resolution of burning symptoms today, but persistent urinary frequency. If holds urine too long, she "will have spasms." Denies fever, chills, sweats, back pain. Endorses nocturia up to 4x per night.  - Also has urge incontinence.  Pt had exercise related urge incontinence prior to hysterectomy and now incontinent of urine w/ exercise, cough/sneeze. - New painful intercourse after hysterectomy.  Symptoms include pain w/ intercourse persisting less than 5 minutes after intercourse, loss of libido despite maintenance of physiologic moisture increase w/ stimulation. - Breasts heavier, fuller, sore and pain associated w/ water in shower.  Sleeps 6-7 hours per night interrupted once for urination w/ occasional difficulty returning to sleep.  HEALTH MAINTENANCE: Weight/BMI: elevated increased hunger Physical activity: no regular exercise Diet: Craves sweets, drinking more soda, takeout. Seatbelt: always Sunscreen: w/ prolonged exposure (poor adherence to reapplying) PAP: not needed - s/p hysterectomy Mammogram: No family history of breast cancer.  Dermatology: recent skin check  VACCINES: Tetanus: administered 2013     Social History  Substance Use Topics  . Smoking status: Never Smoker  . Smokeless tobacco: Never Used  . Alcohol use Yes     Comment: rare    Review of Systems  All other systems reviewed and are negative.  Per HPI unless specifically indicated above     Objective:    BP 119/60  (BP Location: Right Arm, Patient Position: Sitting, Cuff Size: Large)   Pulse 67   Temp 98.2 F (36.8 C) (Oral)   Ht 5\' 9"  (1.753 m)   Wt 253 lb (114.8 kg)   LMP 05/26/2016 (Exact Date)   BMI 37.36 kg/m    Wt Readings from Last 3 Encounters:  10/01/16 253 lb (114.8 kg)  08/14/16 244 lb 12.8 oz (111 kg)  08/01/16 243 lb (110.2 kg)    Physical Exam  General - obese, well-appearing, NAD HEENT - Normocephalic, atraumatic, PERRL, EOMI, patent nares w/o congestion, oropharynx clear, MMM Neck - supple, non-tender, no LAD, mild thyromegaly - goiter not visible to naked eye Heart - RRR, no murmurs heard Lungs - Clear throughout all lobes, no wheezing, crackles, or rhonchi. Normal work of breathing. Abdomen - soft, NTND, no masses, no hepatosplenomegaly, active bowel sounds GU - Normal external female genitalia without lesions or fusion. Vaginal canal without lesions. Cervical cuff normal in appearance w/o lesions. Thick white discharge on exam. Bimanual exam without adnexal masses. Speculum exam performed w/o reproduction of pain.  Extremeties - non-tender, no edema, cap refill < 2 seconds, peripheral pulses intact +2 bilaterally Skin - warm, dry, no rashes Neuro - awake, alert, oriented x3, CN II-X intact, intact muscle strength 5/5 bilaterally, intact distal sensation to light touch, normal coordination, normal gait Psych - Normal mood and affect, normal behavior    Results for orders placed or performed in visit on 10/01/16  Comprehensive metabolic panel  Result Value Ref Range   Sodium 138 135 - 146 mmol/L   Potassium 4.3 3.5 - 5.3 mmol/L  Chloride 102 98 - 110 mmol/L   CO2 27 20 - 31 mmol/L   Glucose, Bld 84 65 - 99 mg/dL   BUN 10 7 - 25 mg/dL   Creat 2.95 2.84 - 1.32 mg/dL   Total Bilirubin 0.4 0.2 - 1.2 mg/dL   Alkaline Phosphatase 47 33 - 115 U/L   AST 13 10 - 30 U/L   ALT 13 6 - 29 U/L   Total Protein 6.6 6.1 - 8.1 g/dL   Albumin 4.0 3.6 - 5.1 g/dL   Calcium 9.0 8.6 -  44.0 mg/dL  Vitamin D (25 hydroxy)  Result Value Ref Range   Vit D, 25-Hydroxy 23 (L) 30 - 100 ng/mL  CBC with Differential/Platelet  Result Value Ref Range   WBC 7.7 3.8 - 10.8 K/uL   RBC 4.16 3.80 - 5.10 MIL/uL   Hemoglobin 12.3 11.7 - 15.5 g/dL   HCT 10.2 72.5 - 36.6 %   MCV 90.4 80.0 - 100.0 fL   MCH 29.6 27.0 - 33.0 pg   MCHC 32.7 32.0 - 36.0 g/dL   RDW 44.0 34.7 - 42.5 %   Platelets 354 140 - 400 K/uL   MPV 8.6 7.5 - 12.5 fL   Neutro Abs 3,927 1,500 - 7,800 cells/uL   Lymphs Abs 2,156 850 - 3,900 cells/uL   Monocytes Absolute 693 200 - 950 cells/uL   Eosinophils Absolute 924 (H) 15 - 500 cells/uL   Basophils Absolute 0 0 - 200 cells/uL   Neutrophils Relative % 51 %   Lymphocytes Relative 28 %   Monocytes Relative 9 %   Eosinophils Relative 12 %   Basophils Relative 0 %   Smear Review Criteria for review not met   Hemoglobin A1c  Result Value Ref Range   Hgb A1c MFr Bld 5.0 <5.7 %   Mean Plasma Glucose 97 mg/dL  Lipid panel  Result Value Ref Range   Cholesterol 206 (H) <200 mg/dL   Triglycerides 97 <956 mg/dL   HDL 48 (L) >38 mg/dL   Total CHOL/HDL Ratio 4.3 <5.0 Ratio   VLDL 19 <30 mg/dL   LDL Cholesterol 756 (H) <100 mg/dL  POCT Wet Prep [employer] [place])  Result Value Ref Range   Source Wet Prep POC vagina    WBC, Wet Prep HPF POC few    Bacteria Wet Prep HPF POC Few Few   BACTERIA WET PREP MORPHOLOGY POC     Clue Cells Wet Prep HPF POC None None   Clue Cells Wet Prep Whiff POC Negative Whiff    Yeast Wet Prep HPF POC Moderate    KOH Wet Prep POC     Trichomonas Wet Prep HPF POC Absent Absent      Assessment & Plan:   Problem List Items Addressed This Visit    None    Visit Diagnoses    Encounter for annual physical exam    -  Primary Physical exam with new findings as below.  Otherwise well adult.  Plan: 1. Obtain health maintenance screenings. 2. Return 1 year for annual physical.   Relevant Orders   Comprehensive metabolic panel   Vitamin D (25  hydroxy)   CBC with Differential/Platelet   Hemoglobin A1c   Lipid panel    Dyspareunia in female     Pt w/ new dyspareunia s/p hysterectomy.  Pt has previously discussed this with her GYN Dr. Greggory Keen noted normal healing at last visit.  Despite time from surgery, pt noting persistent symptoms.  Vaginal  candidiasis possibly contributing some.  Plan: 1. Treat vaginal candidiasis. 2. Continue w/ pelvic floor eval and treat as below. 3. Follow up as needed if symptoms persist.  May need to be seen again by Dr. Greggory Keen.     Pelvic floor dysfunction in female     Pt w/ recent hysterectomy 06/2016.  Pt w/ urge incontinence, urinary frequency, and dyspareunia.  Likely pelvic floor dysfunction.  Plan: 1. PT eval and treat for pelvic floor dysfunction. 2. Encouraged Kegel exercises 1-2 times daily prior to PT. 3. Follow up as needed.     Urge incontinence     Pt w/ urge incontinece prior to hysterectomy w/ worsening symptoms after surgery.   See plan for urinary frequency and pelvic floor dysfunction.    Urinary frequency       Discussed possible causes of symptoms to include cystitis and overactive bladder w/ pt approx 13 weeks s/p  Hysterectomy.  Nocturia of 4x per night.  Pt unable to try to increase time between urination r/t sensation of bladder spasm and incontinence.  Plan: 1. Consider oxybutynin in future.  Pt declines today.   2. Prefers non-pharm option of pelvic floor muscle PT first.  Order for [facility] provided today. 3. Follow up if no improvement w/ PT or if symptoms worsen prior to completion of therapy. 4. May need repeat UA and antibiotics - if so, would likely benefit from urology referral since ongoing problem s/p hysterectomy.     Breast tenderness in female     Unsure of etiology.  Fibrocystic breasts noted on exam.  Otherwise normal breast exam findings and exam performed w/o tenderness today.  Pt noted tenderness increases throughout  hormone cycle and today is less tender.  Possible association w/ hormonal imbalance after hysterectomy.  Plan: 1. Reassured pt likely hormonal imbalance. 2. Follow up as needed if persists after 1-2 more menstrual hormone cycles.  May need GYN referral/follow up.  Vaginal candidiasis Pt w/ thick white vaginal discharge noted on pelvic exam.  Dyspareunia noted.  No other symptoms present.  Wet prep KOH slide w/ yeast proliferation.  Plan: 1. Treat vaginal candidiasis w/ diflucan 150 mg x one dose. 2. Follow up as needed persistent or worsening symptoms.      Meds ordered this encounter  Medications  . DISCONTD: levothyroxine (SYNTHROID, LEVOTHROID) 50 MCG tablet    Sig: Take by mouth.  . DISCONTD: Cranberry (AZO CRANBERRY GUMMIES) 500 MG CHEW    Sig: Chew 1 % by mouth.   . Cranberry-Vitamin C-Probiotic (AZO CRANBERRY PO)    Sig: Take 2 tablets by mouth once.  . Probiotic Product (TRUBIOTICS) CAPS    Sig: Take 1 capsule by mouth daily.      Follow up plan: Return in about 1 year (around 10/01/2017) for annual physical.   Wilhelmina Mcardle, DNP, AGPCNP-BC Adult Gerontology Primary Care Nurse Practitioner [facility] 10/03/2016, 9:22 PM

## 2016-10-02 ENCOUNTER — Other Ambulatory Visit: Payer: Self-pay

## 2016-10-02 DIAGNOSIS — B3731 Acute candidiasis of vulva and vagina: Secondary | ICD-10-CM

## 2016-10-02 DIAGNOSIS — B373 Candidiasis of vulva and vagina: Secondary | ICD-10-CM

## 2016-10-02 MED ORDER — FLUCONAZOLE 150 MG PO TABS: 150.0000 mg | ORAL_TABLET | Freq: Every day | ORAL | 0 refills | Status: DC

## 2016-10-02 NOTE — Telephone Encounter (Signed)
The pt called stating that she went to her pharmacy and the prescription had not been sent over. Please advise

## 2016-10-02 NOTE — Telephone Encounter (Signed)
Prescription sent

## 2016-10-03 ENCOUNTER — Other Ambulatory Visit: Payer: BLUE CROSS/BLUE SHIELD

## 2016-10-03 LAB — CBC WITH DIFFERENTIAL/PLATELET
Basophils Absolute: 0 cells/uL (ref 0–200)
Basophils Relative: 0 %
Eosinophils Absolute: 924 cells/uL — ABNORMAL HIGH (ref 15–500)
Eosinophils Relative: 12 %
HCT: 37.6 % (ref 35.0–45.0)
Hemoglobin: 12.3 g/dL (ref 11.7–15.5)
Lymphocytes Relative: 28 %
Lymphs Abs: 2156 cells/uL (ref 850–3900)
MCH: 29.6 pg (ref 27.0–33.0)
MCHC: 32.7 g/dL (ref 32.0–36.0)
MCV: 90.4 fL (ref 80.0–100.0)
MPV: 8.6 fL (ref 7.5–12.5)
Monocytes Absolute: 693 cells/uL (ref 200–950)
Monocytes Relative: 9 %
Neutro Abs: 3927 cells/uL (ref 1500–7800)
Neutrophils Relative %: 51 %
Platelets: 354 10*3/uL (ref 140–400)
RBC: 4.16 MIL/uL (ref 3.80–5.10)
RDW: 13.1 % (ref 11.0–15.0)
WBC: 7.7 10*3/uL (ref 3.8–10.8)

## 2016-10-03 LAB — COMPREHENSIVE METABOLIC PANEL
ALT: 13 U/L (ref 6–29)
AST: 13 U/L (ref 10–30)
Albumin: 4 g/dL (ref 3.6–5.1)
Alkaline Phosphatase: 47 U/L (ref 33–115)
BUN: 10 mg/dL (ref 7–25)
CO2: 27 mmol/L (ref 20–31)
Calcium: 9 mg/dL (ref 8.6–10.2)
Chloride: 102 mmol/L (ref 98–110)
Creat: 0.68 mg/dL (ref 0.50–1.10)
Glucose, Bld: 84 mg/dL (ref 65–99)
Potassium: 4.3 mmol/L (ref 3.5–5.3)
Sodium: 138 mmol/L (ref 135–146)
Total Bilirubin: 0.4 mg/dL (ref 0.2–1.2)
Total Protein: 6.6 g/dL (ref 6.1–8.1)

## 2016-10-03 LAB — LIPID PANEL
Cholesterol: 206 mg/dL — ABNORMAL HIGH (ref <200)
HDL: 48 mg/dL — ABNORMAL LOW (ref >50)
LDL Cholesterol: 139 mg/dL — ABNORMAL HIGH (ref <100)
Total CHOL/HDL Ratio: 4.3 Ratio (ref <5.0)
Triglycerides: 97 mg/dL (ref <150)
VLDL: 19 mg/dL (ref <30)

## 2016-10-04 LAB — POCT WET PREP (WET MOUNT)
Clue Cells Wet Prep Whiff POC: NEGATIVE
Trichomonas Wet Prep HPF POC: ABSENT

## 2016-10-04 LAB — VITAMIN D 25 HYDROXY (VIT D DEFICIENCY, FRACTURES): Vit D, 25-Hydroxy: 23 ng/mL — ABNORMAL LOW (ref 30–100)

## 2016-10-04 LAB — HEMOGLOBIN A1C
Hgb A1c MFr Bld: 5 % (ref <5.7)
Mean Plasma Glucose: 97 mg/dL

## 2016-10-04 NOTE — Progress Notes (Signed)
I have reviewed this encounter including the documentation in this note and/or discussed this patient with the provider, Cassell Smiles, AGPCNP-BC. I am certifying that I agree with the content of this note as supervising physician.  Nobie Putnam, [facility] 10/04/2016, 8:44 AM

## 2016-10-11 ENCOUNTER — Encounter: Payer: Self-pay | Admitting: Nurse Practitioner

## 2016-10-11 MED ORDER — FLUCONAZOLE 150 MG PO TABS: 150.0000 mg | ORAL_TABLET | ORAL | 0 refills | Status: AC

## 2017-02-26 ENCOUNTER — Ambulatory Visit: Payer: 59 | Admitting: Obstetrics and Gynecology

## 2017-02-26 ENCOUNTER — Encounter: Payer: Self-pay | Admitting: Obstetrics and Gynecology

## 2017-02-26 VITALS — BP 149/93 | HR 80 | Ht 69.0 in | Wt 268.2 lb

## 2017-02-26 DIAGNOSIS — R102 Pelvic and perineal pain: Secondary | ICD-10-CM | POA: Diagnosis not present

## 2017-02-26 DIAGNOSIS — B373 Candidiasis of vulva and vagina: Secondary | ICD-10-CM | POA: Diagnosis not present

## 2017-02-26 DIAGNOSIS — R232 Flushing: Secondary | ICD-10-CM

## 2017-02-26 DIAGNOSIS — F3342 Major depressive disorder, recurrent, in full remission: Secondary | ICD-10-CM | POA: Insufficient documentation

## 2017-02-26 DIAGNOSIS — F418 Other specified anxiety disorders: Secondary | ICD-10-CM

## 2017-02-26 DIAGNOSIS — N898 Other specified noninflammatory disorders of vagina: Secondary | ICD-10-CM | POA: Diagnosis not present

## 2017-02-26 DIAGNOSIS — N941 Unspecified dyspareunia: Secondary | ICD-10-CM | POA: Insufficient documentation

## 2017-02-26 DIAGNOSIS — R309 Painful micturition, unspecified: Secondary | ICD-10-CM | POA: Diagnosis not present

## 2017-02-26 DIAGNOSIS — R6882 Decreased libido: Secondary | ICD-10-CM | POA: Diagnosis not present

## 2017-02-26 DIAGNOSIS — R319 Hematuria, unspecified: Secondary | ICD-10-CM

## 2017-02-26 DIAGNOSIS — B3731 Acute candidiasis of vulva and vagina: Secondary | ICD-10-CM | POA: Insufficient documentation

## 2017-02-26 MED ORDER — BUPROPION HCL ER (XL) 150 MG PO TB24: 150.0000 mg | ORAL_TABLET | Freq: Every day | ORAL | 1 refills | Status: DC

## 2017-02-26 MED ORDER — FLUCONAZOLE 150 MG PO TABS: 150.0000 mg | ORAL_TABLET | ORAL | 0 refills | Status: DC

## 2017-02-26 NOTE — Progress Notes (Addendum)
GYN ENCOUNTER NOTE  Subjective:       Caitlin Barrett is a 46 y.o. G0P0000 female is here for gynecologic evaluation of the following issues:  1.  Dyspareunia. 2.  Vaginal dryness 3.  Decreased libido 4.  Fatigue 5.  Vaginal discharge 6.  Hot flashes  Patient presents in the company of her husband for follow-up on the above issues.  She states that since her hysterectomy, concurred by her spouse, that her sex drive is nonexistent, that she has significant ongoing vaginal dryness and discomfort with intercourse.  She also is chronically tired without energy.  She is experiencing intermittent hot flashes.  She also has a vaginal discharge without significant odor or itching.  PHQ 9 questionnaire for depression is completed-score: 21 Patient is not suicidal. Patient has been on anxiolytic/antidepressants in the past.  Bowel function is normal.  Bladder function is normal.    Gynecologic History Patient's last menstrual period was 05/26/2016 (exact date). Contraception: status post hysterectomy status post TAH bilateral salpingectomy  Obstetric History OB History  Gravida Para Term Preterm AB Living  0 0 0 0 0 0  SAB TAB Ectopic Multiple Live Births  0 0 0 0 0        Past Medical History:  Diagnosis Date  . Allergy    seasonal.  Takes allergy shots at [facility]  . Anemia   . Anxiety   . Arthritis   . Asthma    exercise induced--no inhalers  . Cancer ([place]) 2010   Basal cell skin cancer Lt eye, skin graft above Lt eye  . Depression   . Dysmenorrhea   . Headache   . Hypothyroid    Nodules and Fine needle aspiration  . IBS (irritable bowel syndrome)   . Painful menstrual periods   . Plantar fasciitis   . Sprain of carpal (joint) of wrist     Past Surgical History:  Procedure Laterality Date  . ABDOMINAL HYSTERECTOMY Bilateral 07/02/2016   Procedure: HYSTERECTOMY ABDOMINAL WITH BILATERAL SALPINGECTOMY-MIDLINE INCISION;  Surgeon: Brayton Mars, MD;  Location:  [facility] ORS;  Service: Gynecology;  Laterality: Bilateral;  . CHOLECYSTECTOMY  2009  . HERNIA REPAIR  6578   umbilical  . SKIN GRAFT  2010  . TONSILLECTOMY      Current Outpatient Medications on File Prior to Visit  Medication Sig Dispense Refill  . Chlorphen-Phenyleph-APAP 2-5-325 MG TABS Take 1 tablet by mouth 2 (two) times daily.     . Cranberry-Vitamin C-Probiotic (AZO CRANBERRY PO) Take 2 tablets by mouth once.    Marland Kitchen EPINEPHrine 0.3 mg/0.3 mL IJ SOAJ injection 0.3 mg as needed.     Marland Kitchen ibuprofen (ADVIL,MOTRIN) 400 MG tablet Take 400 mg by mouth every 6 (six) hours as needed.    Marland Kitchen ipratropium (ATROVENT) 0.06 % nasal spray Place 2 sprays into both nostrils 4 (four) times daily. For up to 5-7 days then stop. 15 mL 0  . levothyroxine (SYNTHROID, LEVOTHROID) 50 MCG tablet Take 1 tablet by mouth daily before breakfast.   2  . Probiotic Product (TRUBIOTICS) CAPS Take 1 capsule by mouth daily.     No current facility-administered medications on file prior to visit.     Allergies  Allergen Reactions  . Contrast Media [Iodinated Diagnostic Agents]   . Other Hives    Uncoded Allergy. Allergen: contrast dye Uncoded Allergy. Allergen: contrast dye    Social History   Socioeconomic History  . Marital status: Married    Spouse name: Not  on file  . Number of children: Not on file  . Years of education: Not on file  . Highest education level: Not on file  Social Needs  . Financial resource strain: Not on file  . Food insecurity - worry: Not on file  . Food insecurity - inability: Not on file  . Transportation needs - medical: Not on file  . Transportation needs - non-medical: Not on file  Occupational History  . Not on file  Tobacco Use  . Smoking status: Never Smoker  . Smokeless tobacco: Never Used  Substance and Sexual Activity  . Alcohol use: Yes    Comment: rare  . Drug use: No  . Sexual activity: Yes    Birth control/protection: Surgical  Other Topics Concern  . Not on file   Social History Narrative  . Not on file    Family History  Problem Relation Age of Onset  . Hyperlipidemia Mother   . Hypertension Mother   . Cancer Father        lung CA  . Heart disease Maternal Grandmother   . Stroke Maternal Grandmother   . Hypertension Maternal Grandmother   . Diabetes Maternal Grandmother   . Heart disease Maternal Grandfather   . Diabetes Maternal Grandfather   . Heart attack Maternal Grandfather     The following portions of the patient's history were reviewed and updated as appropriate: allergies, current medications, past family history, past medical history, past social history, past surgical history and problem list.  Review of Systems Review of Systems -comprehensive review of systems is positive for that noted in the HPI.  Objective:   BP (!) 149/93   Pulse 80   Ht 5' 9"  (1.753 m)   Wt 268 lb 3.2 oz (121.7 kg)   LMP 05/26/2016 (Exact Date)   BMI 39.61 kg/m  CONSTITUTIONAL: Well-developed, well-nourished female in no acute distress.  HENT:  Normocephalic, atraumatic.  NECK: Normal range of motion, supple, no masses.  Normal thyroid.  SKIN: Skin is warm and dry. No rash noted. Not diaphoretic. No erythema. No pallor. [place]: Alert and oriented to person, place, and time. PSYCHIATRIC: Normal mood and affect. Normal behavior. Normal judgment and thought content. CARDIOVASCULAR:Not Examined RESPIRATORY: Not Examined BREASTS: Not Examined ABDOMEN: Soft, non distended; Non tender.  No Organomegaly. PELVIC:  External Genitalia: Normal  BUS: Normal  Vagina: Normal  Cervix: Surgically absent  Uterus: Surgically absent  Adnexa: Normal; nonpalpable nontender  RV: Normal external exam  Bladder: Nontender MUSCULOSKELETAL: Normal range of motion. No tenderness.  No cyanosis, clubbing, or edema.  PROCEDURE: Wet prep  Normal saline-few white blood cells; epithelial cells; no trichomonas; no clue cells  KOH-branching hyphae  Assessment:    1. Pelvic pain  2. Dyspareunia, female  3. Anxiety with depression; PHQ 9 score: 21  4. Decreased libido  5. Vaginal dryness  6. Monilial vaginitis - NuSwab BV and Candida, NAA  7. Hot flashes - Follicle stimulating hormone   Symptom complex is likely manifestation of anxiety/depression.  Certainly, ovarian failure can contribute to some of the symptoms as well.  Plan:   1.  Diflucan 150 mg orally every 3 days x2 doses 2.  Begin Wellbutrin XL 150 mg a day 3.  [place] 4.  Psychology counseling strongly encouraged 5.  Return in 6 weeks for follow-up  A total of 25 minutes were spent face-to-face with the patient during this encounter and over half of that time involved counseling and coordination of care.  Hassell Done  A Defrancesco, MD  Note: This dictation was prepared with Dragon dictation along with smaller phrase technology. Any transcriptional errors that result from this process are unintentional.

## 2017-02-26 NOTE — Patient Instructions (Signed)
1.  Begin Wellbutrin XR 150 mg daily 2.  Diflucan 150 mg orally every 3 days x2 doses 3.  Lubricants include:  Coconut oil  Olive oil  Joe H to a lubricant  Astroglide 4.  Recommend psychology counseling 5.  Return in 6 weeks for follow-up

## 2017-02-26 NOTE — Addendum Note (Signed)
Addended by: Elouise Munroe on: 02/26/2017 04:59 PM   Modules accepted: Orders

## 2017-02-27 ENCOUNTER — Other Ambulatory Visit: Payer: Self-pay | Admitting: Obstetrics and Gynecology

## 2017-02-27 DIAGNOSIS — R6882 Decreased libido: Secondary | ICD-10-CM

## 2017-02-27 DIAGNOSIS — E2839 Other primary ovarian failure: Secondary | ICD-10-CM

## 2017-02-27 LAB — FOLLICLE STIMULATING HORMONE: FSH: 56.3 m[IU]/mL

## 2017-02-28 ENCOUNTER — Other Ambulatory Visit: Payer: 59

## 2017-02-28 DIAGNOSIS — E2839 Other primary ovarian failure: Secondary | ICD-10-CM

## 2017-02-28 DIAGNOSIS — R6882 Decreased libido: Secondary | ICD-10-CM

## 2017-02-28 LAB — POCT URINALYSIS DIPSTICK
Bilirubin, UA: NEGATIVE
Glucose, UA: NEGATIVE
Ketones, UA: NEGATIVE
Leukocytes, UA: NEGATIVE
NITRITE UA: NEGATIVE
PH UA: 6 (ref 5.0–8.0)
PROTEIN UA: NEGATIVE
Spec Grav, UA: 1.02 (ref 1.010–1.025)
UROBILINOGEN UA: 0.2 U/dL

## 2017-02-28 NOTE — Addendum Note (Signed)
Addended by: Elouise Munroe on: 02/28/2017 04:04 PM   Modules accepted: Orders

## 2017-03-01 LAB — NUSWAB BV AND CANDIDA, NAA
CANDIDA ALBICANS, NAA: POSITIVE — AB
Candida glabrata, NAA: NEGATIVE

## 2017-03-02 LAB — ESTRADIOL: ESTRADIOL: 5.3 pg/mL

## 2017-03-02 LAB — URINE CULTURE: ORGANISM ID, BACTERIA: NO GROWTH

## 2017-03-02 LAB — TESTOSTERONE, FREE, TOTAL, SHBG
Sex Hormone Binding: 35.2 nmol/L (ref 24.6–122.0)
Testosterone, Free: 1.6 pg/mL (ref 0.0–4.2)
Testosterone: 14 ng/dL (ref 8–48)

## 2017-03-05 ENCOUNTER — Other Ambulatory Visit: Payer: Self-pay | Admitting: Obstetrics and Gynecology

## 2017-03-05 MED ORDER — ESTRADIOL 1 MG PO TABS: 1.0000 mg | ORAL_TABLET | Freq: Every day | ORAL | 12 refills | Status: DC

## 2017-03-05 NOTE — Progress Notes (Signed)
Hallam level: 5.3 (consistent with menopause) Estradiol 1 mg daily is prescribed

## 2017-03-06 DIAGNOSIS — E236 Other disorders of pituitary gland: Secondary | ICD-10-CM | POA: Insufficient documentation

## 2017-04-01 ENCOUNTER — Ambulatory Visit: Payer: PRIVATE HEALTH INSURANCE | Admitting: Nurse Practitioner

## 2017-04-01 ENCOUNTER — Other Ambulatory Visit: Payer: Self-pay

## 2017-04-01 ENCOUNTER — Encounter: Payer: Self-pay | Admitting: Nurse Practitioner

## 2017-04-01 VITALS — BP 130/64 | HR 79 | Temp 97.9°F | Resp 18 | Ht 68.0 in | Wt 274.0 lb

## 2017-04-01 DIAGNOSIS — N393 Stress incontinence (female) (male): Secondary | ICD-10-CM

## 2017-04-01 DIAGNOSIS — M25511 Pain in right shoulder: Secondary | ICD-10-CM

## 2017-04-01 DIAGNOSIS — J189 Pneumonia, unspecified organism: Secondary | ICD-10-CM

## 2017-04-01 DIAGNOSIS — G8929 Other chronic pain: Secondary | ICD-10-CM

## 2017-04-01 DIAGNOSIS — B373 Candidiasis of vulva and vagina: Secondary | ICD-10-CM

## 2017-04-01 DIAGNOSIS — J181 Lobar pneumonia, unspecified organism: Secondary | ICD-10-CM

## 2017-04-01 DIAGNOSIS — B3731 Acute candidiasis of vulva and vagina: Secondary | ICD-10-CM

## 2017-04-01 MED ORDER — FLUTICASONE FUROATE 100 MCG/ACT IN AEPB: 1.0000 | INHALATION_SPRAY | Freq: Every day | RESPIRATORY_TRACT | 1 refills | Status: DC

## 2017-04-01 MED ORDER — FLUCONAZOLE 150 MG PO TABS: 150.0000 mg | ORAL_TABLET | ORAL | 0 refills | Status: DC

## 2017-04-01 MED ORDER — AZITHROMYCIN 250 MG PO TABS: ORAL_TABLET | ORAL | 0 refills | Status: DC

## 2017-04-01 MED ORDER — CEFTRIAXONE SODIUM 1 G IJ SOLR
1.0000 g | Freq: Once | INTRAMUSCULAR | Status: AC
  Administered 2017-04-01: 1 g via INTRAMUSCULAR

## 2017-04-01 NOTE — Patient Instructions (Addendum)
Caitlin Barrett, Thank you for coming in to clinic today.  1. Treat for pneumonia: - Start Arnuity ellipta inhaler once daily. - Continue ProAir 1-2 puffs every 4-6 hours as needed. - START diflucan for vaginal yeast infection. - Continue levocetirizine. - START flonase or other intranasal steroid as needed. - Take mucinex as needed for increased congestion and cough.  2. For your shoulder and stress incontinence.  START physical therapy.  Order provided for [facility].  Please schedule a follow-up appointment with Cassell Smiles, AGNP. Return 7-10 days for pneumonia and in 2-4 weeks for shoulder if symptoms worsen or fail to improve.  If you have any other questions or concerns, please feel free to call the clinic or send a message through [place]. You may also schedule an earlier appointment if necessary.  You will receive a survey after today's visit either digitally by e-mail or paper by [employer]. Your experiences and feedback matter to [place].  Please respond so we know how we are doing as we provide care for you.   Cassell Smiles, DNP, AGNP-BC Adult Gerontology Nurse Practitioner [place]

## 2017-04-01 NOTE — Progress Notes (Signed)
Subjective:    Patient ID: Caitlin Barrett, female    DOB: 1970-11-12, 47 y.o.   MRN: [id number]  Caitlin Barrett is a 47 y.o. female presenting on 04/01/2017 for Shoulder Pain (recurrent persistent right shoulder pain x 2 weeks ) and Sinusitis (bronchits, and fluid in bilateral lungs x 8 days )   HPI Sinusitis 03/24/18 Urgent Care visit.  Symptoms started 12/25 - "Sinusitis, bronchitis, w/ fluid in right lung."  Friday visit and now "fluid in left lung."  Took Augmentin x 7 days and prednisone x 5 days.  Now using ProAir inhaler without much improvement.  Is using 2 puffs q4-6 hours. - Benzonatate 100 mg twice daily - not helping, so started inhaler. - Taking levocetirizine at night. Finished her antibiotic Saturday, but continues to have cough and URI symptoms.  Pt did not have any augmentation of therapy after the visit where she was told she had "fluid in her left lung."   Other interval history: - Has tried Wellbutrin after our last visit, but began to feel itchy.  Is going to try again after all other new meds she started for her sinus symptoms.   Shoulder Pain Injured about 5 years ago, but lately has more frequent pain if holding items.  Discomfort in front of shoulder.  Limited upward ROM for shaving underarm.  Also painful when reaching backward. - No prior xray. - Initial injury with golden retriever walking and running to limits of leash with jerking motion of right shoulder.  Initial improvement with time and not significant continuing pain, but has continued to have limits of ROM.  Now stays weaker than left arm.   Social History   Tobacco Use  . Smoking status: Never Smoker  . Smokeless tobacco: Never Used  Substance Use Topics  . Alcohol use: Yes    Comment: rare  . Drug use: No    Review of Systems Per HPI unless specifically indicated above     Objective:    BP 130/64 (BP Location: Right Arm, Patient Position: Sitting, Cuff Size: Large)   Pulse 79   Temp  97.9 F (36.6 C) (Oral)   Resp 18   Ht 5\' 8"  (1.727 m)   Wt 274 lb (124.3 kg)   LMP 05/26/2016 (Exact Date)   SpO2 99%   BMI 41.66 kg/m    Wt Readings from Last 3 Encounters:  04/08/17 276 lb 9.6 oz (125.5 kg)  04/01/17 274 lb (124.3 kg)  02/26/17 268 lb 3.2 oz (121.7 kg)    Physical Exam  General - obese, well-appearing, NAD HEENT - Normocephalic, atraumatic Neck - supple, non-tender, no LAD, no thyromegaly, no carotid bruit Heart - RRR, no murmurs heard Lungs - Wheezing, coarse crackles throughout all lobes.  No rhonchi noted.  Pt with positive egophony auscultated over left lung base.  Normal work of breathing. Extremeties - non-tender, no edema, cap refill < 2 seconds, peripheral pulses intact +2 bilaterally Right Shoulder Inspection: Normal appearance bilateral symmetrical Palpation: Non-tender to palpation over anterior, lateral, or posterior shoulder ROM: Slightly limited ROM with abduction without pain. Otherwise, intact active ROM forward flexion, internal / external rotation Special Testing: Rotator cuff testing positive for weakness with supraspinatus full can and empty can test, O'brien's negative for labral pain, Hawkin's AC impingement negative for pain Strength: Normal strength 5/5 flex/ext, ext rot / int rot, grip, 4/5 rotator cuff str testing. Neurovascular: Distally intact pulses, sensation to light touch Skin - warm, dry Neuro - awake,  alert, oriented x3, normal gait Psych - Normal mood and affect, normal behavior    Results for orders placed or performed in visit on 02/28/17  Testosterone, Free, Total, SHBG  Result Value Ref Range   Testosterone 14 8 - 48 ng/dL   Testosterone, Free 1.6 0.0 - 4.2 pg/mL   Sex Hormone Binding 35.2 24.6 - 122.0 nmol/L  Estradiol  Result Value Ref Range   Estradiol 5.3 pg/mL      Assessment & Plan:   Problem List Items Addressed This Visit      Other   Stress incontinence Pt with worsening stress incontinence after  hysterectomy surgery.  Pt has previously declined pelvic floor training, but now agrees.  Plan: 1. Referral to PT for pelvic floor training. 2. Followup as needed.   Relevant Orders   Ambulatory referral to Physical Therapy    Other Visit Diagnoses    Pneumonia of right upper lobe due to infectious organism (HCC)    -  Primary Likely CAP per clinical presentation and duration of symptoms with prior URI and symptoms refractory to amoxicillin treatment.  Plan: - Start Arnuity ellipta inhaler once daily. - Continue ProAir 1-2 puffs every 4-6 hours as needed. - START diflucan for vaginal yeast infection. - Continue levocetirizine. - START flonase or other intranasal steroid as needed. - Take mucinex as needed for increased congestion and cough. - Administer ceftriaxone 1 g IM today in clinic. - Followup 7-10 days as needed if no improvement or worsening of symptoms.   Relevant Medications   albuterol (PROVENTIL HFA;VENTOLIN HFA) 108 (90 Base) MCG/ACT inhaler   cefTRIAXone (ROCEPHIN) injection 1 g (Completed)   Fluticasone Furoate (ARNUITY ELLIPTA) 100 MCG/ACT AEPB    Vaginal candidiasis     Pt reports current increased vaginal secretions and typical candida infection after penicillin antibiotic.     Plan: 1. Treat with diflucan 150 mg once now, repeat in 3 days as needed. 2. Followup as needed    Chronic right shoulder pain     Pt with chronic right shoulder pain possibly related to old injury and rotator cuff weakness.  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  Plan: 1. Treat with acetaminophen and ibuprofen.  Discussed alternate dosing and max dosing. 2. Apply heat and/or ice to affected area. 3. May also apply a muscle rub with lidocaine after heat or ice. 4. START physical therapy.  Order provided for [facility] 5. Follow up PRN   Relevant Orders   Ambulatory referral to Physical Therapy      Meds ordered this  encounter  Medications  . cefTRIAXone (ROCEPHIN) injection 1 g  . DISCONTD: fluconazole (DIFLUCAN) 150 MG tablet    Sig: Take 1 tablet (150 mg total) by mouth every 3 (three) days.    Dispense:  2 tablet    Refill:  0    Order Specific Question:   Supervising Provider    Answer:   Smitty Cords [[id number]]  . DISCONTD: azithromycin (ZITHROMAX) 250 MG tablet    Sig: Take one tablet twice today.  Then take 1 tablet daily for 4 days.    Dispense:  6 tablet    Refill:  0    Order Specific Question:   Supervising Provider    Answer:   Smitty Cords [[id number]]  . Fluticasone Furoate (ARNUITY ELLIPTA) 100 MCG/ACT AEPB    Sig: Inhale 1 Inhaler into the lungs daily.    Dispense:  30 each    Refill:  1    Order Specific Question:   Supervising Provider    Answer:   Smitty Cords [[id number]]     Follow up plan: Return 7-10 days for pneumonia and in 2-4 weeks for shoulder if symptoms worsen or fail to improve.   Wilhelmina Mcardle, DNP, AGPCNP-BC Adult Gerontology Primary Care Nurse Practitioner [facility] 04/15/2017, 1:15 PM

## 2017-04-03 ENCOUNTER — Encounter: Payer: Self-pay | Admitting: Nurse Practitioner

## 2017-04-03 DIAGNOSIS — N941 Unspecified dyspareunia: Secondary | ICD-10-CM

## 2017-04-03 DIAGNOSIS — N393 Stress incontinence (female) (male): Secondary | ICD-10-CM

## 2017-04-08 ENCOUNTER — Other Ambulatory Visit: Payer: Self-pay

## 2017-04-08 ENCOUNTER — Ambulatory Visit: Payer: 59 | Admitting: Nurse Practitioner

## 2017-04-08 ENCOUNTER — Encounter: Payer: Self-pay | Admitting: Nurse Practitioner

## 2017-04-08 VITALS — BP 143/75 | HR 83 | Temp 98.1°F | Resp 18 | Ht 68.0 in | Wt 276.6 lb

## 2017-04-08 DIAGNOSIS — R197 Diarrhea, unspecified: Secondary | ICD-10-CM

## 2017-04-08 NOTE — Patient Instructions (Addendum)
Caitlin Barrett, Thank you for coming in to clinic today.  1. Continue probiotic pill.  Add probiotic food - Kombucha is non-dairy.  2. START metamucil 1 dose once daily for 2-3 days, then reduce to 1/2 dose if needed.  Too much too fast will make you bloated.  3. If you still have watery diarrhea without any solid pieces in about 3 days, bring [place] your stool sample.   Please schedule a follow-up appointment with Cassell Smiles, AGNP.  Return 5-7 days if symptoms worsen or fail to improve.  If you have any other questions or concerns, please feel free to call the clinic or send a message through [place]. You may also schedule an earlier appointment if necessary.  You will receive a survey after today's visit either digitally by e-mail or paper by [employer]. Your experiences and feedback matter to [place].  Please respond so we know how we are doing as we provide care for you.  Cassell Smiles, DNP, AGNP-BC Adult Gerontology Nurse Practitioner [facility], [place] Choices to Help Relieve Diarrhea, Adult When you have diarrhea, the foods you eat and your eating habits are very important. Choosing the right foods and drinks can help:  Relieve diarrhea.  Replace lost fluids and nutrients.  Prevent dehydration.  What general guidelines should I follow? Relieving diarrhea  Choose foods with less than 2 g or .07 oz. of fiber per serving.  Limit fats to less than 8 tsp (38 g or 1.34 oz.) a day.  Avoid the following: ? Foods and beverages sweetened with high-fructose corn syrup, honey, or sugar alcohols such as xylitol, sorbitol, and mannitol. ? Foods that contain a lot of fat or sugar. ? Fried, greasy, or spicy foods.  Eat foods that are rich in probiotics. These foods include dairy products such as yogurt and fermented milk products. They help increase healthy bacteria in the stomach and intestines (gastrointestinal tract, or GI tract).  If you have lactose intolerance, avoid  dairy products. These may make your diarrhea worse.  Take medicine to help stop diarrhea (antidiarrheal medicine) only as told by your health care provider. Replacing nutrients  Eat small meals or snacks every 3-4 hours.  Eat bland foods, such as white rice, toast, or baked potato, until your diarrhea starts to get better. Gradually reintroduce nutrient-rich foods as tolerated or as told by your health care provider. This includes: ? Well-cooked protein foods. ? Peeled, seeded, and soft-cooked fruits and vegetables. ? Low-fat dairy products.  Take vitamin and mineral supplements as told by your health care provider. Preventing dehydration   Start by sipping water or a special solution to prevent dehydration (oral rehydration solution, ORS). Urine that is clear or pale yellow means that you are getting enough fluid.  Try to drink at least 8-10 cups of fluid each day to help replace lost fluids.  You may add other liquids in addition to water, such as clear juice or decaffeinated sports drinks, as tolerated or as told by your health care provider.  Avoid drinks with caffeine, such as coffee, tea, or soft drinks.  Avoid alcohol. What foods are recommended? The items listed may not be a complete list. Talk with your health care provider about what dietary choices are best for you. Grains White rice. White, [place], or pita breads (fresh or toasted), including plain rolls, buns, or bagels. White pasta. Saltine, soda, or graham crackers. Pretzels. Low-fiber cereal. Cooked cereals made with water (such as cornmeal, farina,  or cream cereals). Plain muffins. Matzo. Melba toast. Zwieback. Vegetables Potatoes (without the skin). Most well-cooked and canned vegetables without skins or seeds. Tender lettuce. Fruits Apple sauce. Fruits canned in juice. Cooked apricots, cherries, grapefruit, peaches, pears, or plums. Fresh bananas and cantaloupe. Meats and other protein foods Baked or boiled  chicken. Eggs. Tofu. Fish. Seafood. Smooth nut butters. Ground or well-cooked tender beef, ham, veal, lamb, pork, or poultry. Dairy Plain yogurt, kefir, and unsweetened liquid yogurt. Lactose-free milk, buttermilk, skim milk, or soy milk. Low-fat or nonfat hard cheese. Beverages Water. Low-calorie sports drinks. Fruit juices without pulp. Strained tomato and vegetable juices. Decaffeinated teas. Sugar-free beverages not sweetened with sugar alcohols. Oral rehydration solutions, if approved by your health care provider. Seasoning and other foods Bouillon, broth, or soups made from recommended foods.  What foods are not recommended? The items listed may not be a complete list. Talk with your health care provider about what dietary choices are best for you. Fruits Dried fruit, including raisins and dates. Raw fruits. Stewed or dried prunes. Canned fruits with syrup. Meat and other protein foods Fried or fatty meats. Deli meats. Chunky nut butters. Nuts and seeds. Beans and lentils. Berniece Salines. Hot dogs. Sausage. Dairy High-fat cheeses. Whole milk, chocolate milk, and beverages made with milk, such as milk shakes. Half-and-half. Cream. sour cream. Ice cream. Beverages Caffeinated beverages (such as coffee, tea, soda, or energy drinks). Alcoholic beverages. Fruit juices with pulp. Prune juice. Soft drinks sweetened with high-fructose corn syrup or sugar alcohols. High-calorie sports drinks. Fats and oils Butter. Cream sauces. Margarine. Salad oils. Plain salad dressings. Olives. Avocados. Mayonnaise. Sweets and desserts Sweet rolls, doughnuts, and sweet breads. Sugar-free desserts sweetened with sugar alcohols such as xylitol and sorbitol. Seasoning and other foods Honey. Hot sauce. Chili powder. Gravy. Cream-based or milk-based soups. Pancakes and waffles. Summary  When you have diarrhea, the foods you eat and your eating habits are very important.  Make sure you get at least 8-10 cups of fluid  each day, or enough to keep your urine clear or pale yellow.  Eat bland foods and gradually reintroduce healthy, nutrient-rich foods as tolerated, or as told by your health care provider.  Avoid high-fiber, fried, greasy, or spicy foods. This information is not intended to replace advice given to you by your health care provider. Make sure you discuss any questions you have with your health care provider. Document Released: 06/02/2003 Document Revised: 03/09/2016 Document Reviewed: 03/09/2016 Elsevier Interactive Patient Education  [employer].

## 2017-04-08 NOTE — Progress Notes (Signed)
Subjective:    Patient ID: Caitlin Barrett, female    DOB: [birth year], 47 y.o.   MRN: [id number]  Caitlin Barrett is a 47 y.o. female presenting on 04/08/2017 for Pneumonia (f/u x 7 days ago, pt notice some improvement. Persistent cough, chest burning, and nasal congestion w/ bloody mucus. ) - Significant CC: new onset diarrhea  HPI Pneumonia Improving, but still has persistent cough, burning in chest with cough that is worst in evening.  Similar to heartburn. - More trouble with breathing at night.  - Pt denies fever, chills, sweats, nausea, vomiting, constipation, but felt warm last night. She is no longer having hot flashes, so is likely fever. - Has taken OTC nighttime cold/flu with cough suppressant at night.  Diarrhea:  Has been taking probiotics as well.  Started a different one with diflucan.  Pt reports very soft BM in morning and converting to water-like consistency by end of day.  Social History   Tobacco Use  . Smoking status: Never Smoker  . Smokeless tobacco: Never Used  Substance Use Topics  . Alcohol use: Yes    Comment: rare  . Drug use: No    Review of Systems Per HPI unless specifically indicated above     Objective:    BP (!) 143/75 (BP Location: Right Arm, Patient Position: Sitting, Cuff Size: Large)   Pulse 83   Temp 98.1 F (36.7 C) (Oral)   Resp 18   Ht 5\' 8"  (1.727 m)   Wt 276 lb 9.6 oz (125.5 kg)   LMP 05/26/2016 (Exact Date)   SpO2 99%   BMI 42.06 kg/m    Wt Readings from Last 3 Encounters:  04/08/17 276 lb 9.6 oz (125.5 kg)  04/01/17 274 lb (124.3 kg)  02/26/17 268 lb 3.2 oz (121.7 kg)    Physical Exam General - obese, well-appearing, NAD HEENT - Normocephalic, atraumatic, PERRL, EOMI, patent nares w/o congestion, oropharynx clear, MMM Neck - supple, non-tender, no cervical/axillary LAD Heart - RRR, no murmurs heard Lungs - Rales throughout all lobes, but no wheezing, crackles, or rhonchi. Normal work of breathing. Reactive cough  with full inspiration. Abdomen - soft, NTND, no masses, no hepatosplenomegaly, hyperactive bowel sounds Extremeties - non-tender, no edema, cap refill < 2 seconds, peripheral pulses intact +2 bilaterally Skin - warm, dry, no rashes Neuro - awake, alert, oriented x3, normal gait Psych - Normal mood and affect, normal behavior   Results for orders placed or performed in visit on 02/28/17  Testosterone, Free, Total, SHBG  Result Value Ref Range   Testosterone 14 8 - 48 ng/dL   Testosterone, Free 1.6 0.0 - 4.2 pg/mL   Sex Hormone Binding 35.2 24.6 - 122.0 nmol/L  Estradiol  Result Value Ref Range   Estradiol 5.3 pg/mL      Assessment & Plan:   Problem List Items Addressed This Visit    None    Visit Diagnoses    Diarrhea of presumed infectious origin    -  Primary Acute colitis mild abdominal cramping, but no nausea/vomiting, significant diarrhea without bright red blood per rectum. - Likely triggered by recent antibiotics. - Hemodynamically stable, afebrile, currently hydrated, no acute abdomen today  Plan: 1. Check CMET, CBC 2. Since from antibiotics, will need to start probiotic therapy once daily 3. Perform stool cultures with watery diarrhea for c. Diff, ova/parasites, and gen culture if persists > 3 days. 4. Continue drinking plenty of water. - Eat higher fiber diet.   -  START metamucil 1 dose once daily for bulk forming to reduce watery diarrhea. 5. Strict return criteria or when to go to ED if worsening 24-48 hours 6. Followup prn 5-7 days   Relevant Orders   Cdiff NAA+O+P+Stool Culture       Follow up plan: Return 5-7 days if symptoms worsen or fail to improve.   Wilhelmina Mcardle, DNP, AGPCNP-BC Adult Gerontology Primary Care Nurse Practitioner [facility] 04/08/2017, 3:52 PM

## 2017-04-09 ENCOUNTER — Encounter: Payer: 59 | Admitting: Obstetrics and Gynecology

## 2017-04-15 ENCOUNTER — Encounter: Payer: Self-pay | Admitting: Nurse Practitioner

## 2017-04-17 ENCOUNTER — Encounter: Payer: Self-pay | Admitting: Obstetrics and Gynecology

## 2017-04-17 ENCOUNTER — Ambulatory Visit: Payer: PRIVATE HEALTH INSURANCE | Admitting: Obstetrics and Gynecology

## 2017-04-17 VITALS — BP 139/84 | HR 67 | Ht 68.0 in | Wt 276.5 lb

## 2017-04-17 DIAGNOSIS — Z90710 Acquired absence of both cervix and uterus: Secondary | ICD-10-CM | POA: Diagnosis not present

## 2017-04-17 DIAGNOSIS — N941 Unspecified dyspareunia: Secondary | ICD-10-CM

## 2017-04-17 DIAGNOSIS — N898 Other specified noninflammatory disorders of vagina: Secondary | ICD-10-CM

## 2017-04-17 DIAGNOSIS — F418 Other specified anxiety disorders: Secondary | ICD-10-CM

## 2017-04-17 NOTE — Patient Instructions (Signed)
1.  Continue taking Wellbutrin XL 150 mg a day 2.  Continue taking estradiol 1 mg daily 3.  Recommend lubricants for vaginal dryness as needed 4.  Return in 6 months for follow-up

## 2017-04-17 NOTE — Progress Notes (Signed)
Chief complaint: 1.  Anxiety with depression 2.  Dyspareunia 3.  Vaginal atrophy  Caitlin Barrett presents today for follow-up.  She was started on Wellbutrin XL 150 mg daily as well as estradiol 1 mg daily because of symptoms of menopause and confirmatory findings on [facility] and estradiol testing.  FSH was high; estradiol level was low.  Symptoms of hot flashes and night sweats have diminished significantly.  Fatigue is lessened.  Mood swings are less severe and overall patient feels well.  However, she is still healing from a pneumonia that was diagnosed over the holidays. Pelvic pain with intercourse is difficult to assess at this time because they have not had relations since last visit.  Patient had a question about a vaginal lesion that was noted at last visit (I did not document that in my note); physical exam is done today in order to assess this concern.  Past Medical History:  Diagnosis Date  . Allergy    seasonal.  Takes allergy shots at [facility]  . Anemia   . Anxiety   . Arthritis   . Asthma    exercise induced--no inhalers  . Cancer ([place]) 2010   Basal cell skin cancer Lt eye, skin graft above Lt eye  . Depression   . Dysmenorrhea   . Headache   . Hypothyroid    Nodules and Fine needle aspiration  . IBS (irritable bowel syndrome)   . Painful menstrual periods   . Plantar fasciitis   . Sprain of carpal (joint) of wrist    Past Surgical History:  Procedure Laterality Date  . ABDOMINAL HYSTERECTOMY Bilateral 07/02/2016   Procedure: HYSTERECTOMY ABDOMINAL WITH BILATERAL SALPINGECTOMY-MIDLINE INCISION;  Surgeon: Brayton Mars, MD;  Location: [facility] ORS;  Service: Gynecology;  Laterality: Bilateral;  . CHOLECYSTECTOMY  2009  . HERNIA REPAIR  1610   umbilical  . SKIN GRAFT  2010  . TONSILLECTOMY      OBJECTIVE: BP 139/84   Pulse 67   Ht 5' 8"  (1.727 m)   Wt 276 lb 8 oz (125.4 kg)   LMP 05/26/2016 (Exact Date)   BMI 42.04 kg/m  Pleasant well-appearing female in no  acute distress.  Alert and oriented. Abdomen: Soft, nontender without organomegaly; midline incision is well-healed.  No hernias noted. Pelvic exam: External genitalia-normal BUS-normal Vagina-good vault support; no significant discharge; no mucosal abnormalities seen. Cervix-surgically absent Uterus-surgically absent Adnexa-non-palpable and nontender Rectovaginal-normal external exam  ASSESSMENT: 1.  Anxiety with depression, improved on medication; no medication side effects 2.  Menopause, asymptomatic on estradiol therapy 3.  History of dyspareunia, without recent symptoms.  PLAN: 1.  Continue taking Wellbutrin XL 150 mg a day 2.  Continue taking estradiol 1 mg daily 3.  Recommend lubricants as needed during intercourse 4.  Return in 6 months for follow-up  A total of 15 minutes were spent face-to-face with the patient during this encounter and over half of that time dealt with counseling and coordination of care.  Brayton Mars, MD  Note: This dictation was prepared with Dragon dictation along with smaller phrase technology. Any transcriptional errors that result from this process are unintentional.

## 2017-04-22 ENCOUNTER — Ambulatory Visit: Payer: PRIVATE HEALTH INSURANCE | Attending: Nurse Practitioner

## 2017-04-22 ENCOUNTER — Other Ambulatory Visit: Payer: Self-pay

## 2017-04-22 DIAGNOSIS — M6281 Muscle weakness (generalized): Secondary | ICD-10-CM | POA: Diagnosis present

## 2017-04-22 DIAGNOSIS — R278 Other lack of coordination: Secondary | ICD-10-CM | POA: Diagnosis present

## 2017-04-22 DIAGNOSIS — M62838 Other muscle spasm: Secondary | ICD-10-CM | POA: Diagnosis present

## 2017-04-22 DIAGNOSIS — M791 Myalgia, unspecified site: Secondary | ICD-10-CM

## 2017-04-22 NOTE — Patient Instructions (Signed)
Child's Pose     Sit in knee-chest position and reach arms forward. Separate knees for comfort. Hold position for _5__ breaths. Repeat __3_ times. Do __1-3_ times per day.   Stabilization: Diaphragmatic Breathing    Lie with knees bent, feet flat. Place one hand on stomach, other on chest. Breathe deeply through nose, lifting belly hand without any motion of hand on chest. Repeat __5__ times per set. Do _2___ sets per session. Do __7__ sessions per week.

## 2017-04-22 NOTE — Therapy (Signed)
[place] [facility] SERVICES [address] [place], [place], [address] Phone: [phone]   Fax:  [phone]  Physical Therapy Evaluation  Patient Details  Name: Caitlin Barrett MRN: [id number] Date of Birth: 07/06/1970 Referring Provider: Cassell Smiles   Encounter Date: 04/22/2017  PT End of Session - 04/22/17 1631    Visit Number  1    Number of Visits  12    Date for PT Re-Evaluation  07/15/17    PT Start Time  1503    PT Stop Time  4656    PT Time Calculation (min)  71 min    Activity Tolerance  Patient tolerated treatment well    Behavior During Therapy  [facility] for tasks assessed/performed       Past Medical History:  Diagnosis Date  . Allergy    seasonal.  Takes allergy shots at [facility]  . Anemia   . Anxiety   . Arthritis   . Asthma    exercise induced--no inhalers  . Cancer ([place]) 2010   Basal cell skin cancer Lt eye, skin graft above Lt eye  . Depression   . Dysmenorrhea   . Headache   . Hypothyroid    Nodules and Fine needle aspiration  . IBS (irritable bowel syndrome)   . Painful menstrual periods   . Plantar fasciitis   . Sprain of carpal (joint) of wrist     Past Surgical History:  Procedure Laterality Date  . ABDOMINAL HYSTERECTOMY Bilateral 07/02/2016   Procedure: HYSTERECTOMY ABDOMINAL WITH BILATERAL SALPINGECTOMY-MIDLINE INCISION;  Surgeon: Brayton Mars, MD;  Location: [facility] ORS;  Service: Gynecology;  Laterality: Bilateral;  . CHOLECYSTECTOMY  2009  . HERNIA REPAIR  8127   umbilical  . SKIN GRAFT  2010  . TONSILLECTOMY      There were no vitals filed for this visit.       [facility] PT Assessment - 04/22/17 0001      Assessment   Medical Diagnosis  Dysperunia/ Stress UI    Referring Provider  Cassell Smiles    Onset Date/Surgical Date  07/02/16    Prior Therapy  none      Precautions   Precautions  None      Restrictions   Weight Bearing Restrictions  No      Balance Screen    Has the patient fallen in the past 6 months  Yes    How many times?  1 september, hurt R ankle    Has the patient had a decrease in activity level because of a fear of falling?   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 Yes, menopausal due to hysterectomy Unexplained weight loss? no Saddle anesthesia? no Unexplained changes in bowel or bladder habits? Yes, increased urgency after hysterectomy.  SUBJECTIVE  Care  Patient       Patient will benefit from skilled therapeutic intervention in order to improve the following deficits and impairments:  Increased fascial restricitons, Improper body mechanics, Pain, Decreased  coordination, Decreased mobility, Decreased scar mobility, Increased muscle spasms, Postural dysfunction, Decreased activity tolerance, Decreased endurance, Decreased range of motion, Decreased strength, Decreased balance, Obesity  Visit Diagnosis: Other muscle spasm  Other lack of coordination  Muscle weakness (generalized)  Myalgia     Problem List Patient Active Problem List   Diagnosis Date Noted  . Monilial vaginitis 02/26/2017  . Vaginal dryness 02/26/2017  . Decreased libido 02/26/2017  . Anxiety with depression 02/26/2017  . Dyspareunia, female 02/26/2017  . S/P TAH (total abdominal hysterectomy) 07/02/2016  . 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 Nocturia 07/21/2015  . Stress incontinence 07/21/2015  . 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 Allergic rhinitis 06/21/2015  . Obesity (BMI 30-39.9) 06/21/2015   Willa Rough DPT, ATC Willa Rough 04/22/2017, 4:50 PM  [place] [facility] SERVICES [address] [place], [place], [address] Phone: [phone]   Fax:  [phone]  Name: Caitlin Barrett MRN: [id number] Date of Birth: September 14, 1970  [place] [facility] SERVICES [address] [place], [place], [address] Phone: [phone]   Fax:  [phone]  Physical Therapy Evaluation  Patient Details  Name: Caitlin Barrett MRN: [id number] Date of Birth: 07/06/1970 Referring Provider: Cassell Smiles   Encounter Date: 04/22/2017  PT End of Session - 04/22/17 1631    Visit Number  1    Number of Visits  12    Date for PT Re-Evaluation  07/15/17    PT Start Time  1503    PT Stop Time  4656    PT Time Calculation (min)  71 min    Activity Tolerance  Patient tolerated treatment well    Behavior During Therapy  [facility] for tasks assessed/performed       Past Medical History:  Diagnosis Date  . Allergy    seasonal.  Takes allergy shots at [facility]  . Anemia   . Anxiety   . Arthritis   . Asthma    exercise induced--no inhalers  . Cancer ([place]) 2010   Basal cell skin cancer Lt eye, skin graft above Lt eye  . Depression   . Dysmenorrhea   . Headache   . Hypothyroid    Nodules and Fine needle aspiration  . IBS (irritable bowel syndrome)   . Painful menstrual periods   . Plantar fasciitis   . Sprain of carpal (joint) of wrist     Past Surgical History:  Procedure Laterality Date  . ABDOMINAL HYSTERECTOMY Bilateral 07/02/2016   Procedure: HYSTERECTOMY ABDOMINAL WITH BILATERAL SALPINGECTOMY-MIDLINE INCISION;  Surgeon: Brayton Mars, MD;  Location: [facility] ORS;  Service: Gynecology;  Laterality: Bilateral;  . CHOLECYSTECTOMY  2009  . HERNIA REPAIR  8127   umbilical  . SKIN GRAFT  2010  . TONSILLECTOMY      There were no vitals filed for this visit.       [facility] PT Assessment - 04/22/17 0001      Assessment   Medical Diagnosis  Dysperunia/ Stress UI    Referring Provider  Cassell Smiles    Onset Date/Surgical Date  07/02/16    Prior Therapy  none      Precautions   Precautions  None      Restrictions   Weight Bearing Restrictions  No      Balance Screen    Has the patient fallen in the past 6 months  Yes    How many times?  1 september, hurt R ankle    Has the patient had a decrease in activity level because of a fear of falling?   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 Yes, menopausal due to hysterectomy Unexplained weight loss? no Saddle anesthesia? no Unexplained changes in bowel or bladder habits? Yes, increased urgency after hysterectomy.  SUBJECTIVE  [place] [facility] SERVICES [address] [place], [place], [address] Phone: [phone]   Fax:  [phone]  Physical Therapy Evaluation  Patient Details  Name: Caitlin Barrett MRN: [id number] Date of Birth: 07/06/1970 Referring Provider: Cassell Smiles   Encounter Date: 04/22/2017  PT End of Session - 04/22/17 1631    Visit Number  1    Number of Visits  12    Date for PT Re-Evaluation  07/15/17    PT Start Time  1503    PT Stop Time  4656    PT Time Calculation (min)  71 min    Activity Tolerance  Patient tolerated treatment well    Behavior During Therapy  [facility] for tasks assessed/performed       Past Medical History:  Diagnosis Date  . Allergy    seasonal.  Takes allergy shots at [facility]  . Anemia   . Anxiety   . Arthritis   . Asthma    exercise induced--no inhalers  . Cancer ([place]) 2010   Basal cell skin cancer Lt eye, skin graft above Lt eye  . Depression   . Dysmenorrhea   . Headache   . Hypothyroid    Nodules and Fine needle aspiration  . IBS (irritable bowel syndrome)   . Painful menstrual periods   . Plantar fasciitis   . Sprain of carpal (joint) of wrist     Past Surgical History:  Procedure Laterality Date  . ABDOMINAL HYSTERECTOMY Bilateral 07/02/2016   Procedure: HYSTERECTOMY ABDOMINAL WITH BILATERAL SALPINGECTOMY-MIDLINE INCISION;  Surgeon: Brayton Mars, MD;  Location: [facility] ORS;  Service: Gynecology;  Laterality: Bilateral;  . CHOLECYSTECTOMY  2009  . HERNIA REPAIR  8127   umbilical  . SKIN GRAFT  2010  . TONSILLECTOMY      There were no vitals filed for this visit.       [facility] PT Assessment - 04/22/17 0001      Assessment   Medical Diagnosis  Dysperunia/ Stress UI    Referring Provider  Cassell Smiles    Onset Date/Surgical Date  07/02/16    Prior Therapy  none      Precautions   Precautions  None      Restrictions   Weight Bearing Restrictions  No      Balance Screen    Has the patient fallen in the past 6 months  Yes    How many times?  1 september, hurt R ankle    Has the patient had a decrease in activity level because of a fear of falling?   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 Yes, menopausal due to hysterectomy Unexplained weight loss? no Saddle anesthesia? no Unexplained changes in bowel or bladder habits? Yes, increased urgency after hysterectomy.  SUBJECTIVE

## 2017-04-25 ENCOUNTER — Encounter: Payer: Self-pay | Admitting: Nurse Practitioner

## 2017-04-29 ENCOUNTER — Ambulatory Visit: Payer: PRIVATE HEALTH INSURANCE

## 2017-05-02 ENCOUNTER — Encounter: Payer: Self-pay | Admitting: Nurse Practitioner

## 2017-05-02 ENCOUNTER — Ambulatory Visit: Payer: PRIVATE HEALTH INSURANCE | Admitting: Nurse Practitioner

## 2017-05-02 ENCOUNTER — Other Ambulatory Visit: Payer: Self-pay

## 2017-05-02 VITALS — BP 114/57 | HR 78 | Temp 98.2°F | Ht 68.0 in | Wt 278.6 lb

## 2017-05-02 DIAGNOSIS — M25472 Effusion, left ankle: Secondary | ICD-10-CM

## 2017-05-02 DIAGNOSIS — S93402A Sprain of unspecified ligament of left ankle, initial encounter: Secondary | ICD-10-CM

## 2017-05-02 NOTE — Progress Notes (Signed)
Subjective:    Patient ID: Caitlin Barrett, female    DOB: 02-Mar-1971, 47 y.o.   MRN: [id number]  Caitlin Barrett is a 47 y.o. female presenting on 05/02/2017 for Joint Swelling (left ankle swollen x 4 days ago w/ pain . Pt injured this ankle back in 11/2016, but have not had anymore issues since then.)   HPI Left ankle Swelling Had Xray at urgent care September 2018 with diagnosis of left ankle sprain.  Had completely resolved until 4 days ago.  Started swelling again and is tender to touch. - No noted re-injury - Swelling was first symptom prior to pain and appeared to have a grapefruit - sized localized swelling to lateral side of left ankle/foot. - Has had difficulty wearing some shoes.  - Has taken ibuprofen 2 tab bid.  No other conservative/OTC therapies have been tried.  Social History   Tobacco Use  . Smoking status: Never Smoker  . Smokeless tobacco: Never Used  Substance Use Topics  . Alcohol use: Yes    Comment: rare  . Drug use: No    Review of Systems Per HPI unless specifically indicated above     Objective:    BP (!) 114/57 (BP Location: Right Arm, Patient Position: Sitting, Cuff Size: Large)   Pulse 78   Temp 98.2 F (36.8 C) (Oral)   Ht 5\' 8"  (1.727 m)   Wt 278 lb 9.6 oz (126.4 kg)   LMP 05/26/2016 (Exact Date)   BMI 42.36 kg/m   Wt Readings from Last 3 Encounters:  05/02/17 278 lb 9.6 oz (126.4 kg)  04/17/17 276 lb 8 oz (125.4 kg)  04/08/17 276 lb 9.6 oz (125.5 kg)    Physical Exam  Constitutional: She is oriented to person, place, and time. She appears well-developed and well-nourished. No distress.  HENT:  Head: Normocephalic and atraumatic.  Neck: Normal range of motion. Neck supple.  Cardiovascular: Normal rate, regular rhythm, S1 normal, S2 normal, normal heart sounds and intact distal pulses.  Pulmonary/Chest: Effort normal and breath sounds normal. No respiratory distress.  Musculoskeletal:  Left Ankle Inspection: edema with L lateral  joint effusion noted  Palpation: non-tender to palpation of bony prominences/midfoot.  Tender to soft tissues anterior and posterior to malleolus.  Fluctuant effusion without erythema or warmth ROM: normal ROM with normal flex/extension, internal/external rotation, eversion/inversion without pain. Special Testing: weight bearing tolerated Strength: 4/5 with flex/extension - limited 2/2 pain Neurovascular: intact sensation to light touch, normal cap refill < 2 sec  Right Ankle Inspection: edema with mild R lateral joint effusion noted Palpation: non-tender to palpation of soft tissues and bony prominences. ROM: normal ROM with normal flex/extension, internal/external rotation, eversion/inversion without pain. Special Testing: weight bearing tolerated Strength: 5/5 with flex/extension  Neurovascular: intact sensation to light touch, normal cap refill < 2 sec  Neurological: She is alert and oriented to person, place, and time.  Skin: Skin is warm and dry.  Psychiatric: She has a normal mood and affect. Her behavior is normal.  Vitals reviewed.     Results for orders placed or performed in visit on 02/28/17  Testosterone, Free, Total, SHBG  Result Value Ref Range   Testosterone 14 8 - 48 ng/dL   Testosterone, Free 1.6 0.0 - 4.2 pg/mL   Sex Hormone Binding 35.2 24.6 - 122.0 nmol/L  Estradiol  Result Value Ref Range   Estradiol 5.3 pg/mL      Assessment & Plan:   Problem List Items Addressed This Visit  None    Visit Diagnoses    Left ankle effusion    -  Primary   Mild ankle sprain, left, initial encounter        Pain likely self-limited.  Ankle sprain possible complicated by re-injury and new effusion to left ankle.  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  Plan:  1. Treat with OTC pain meds (acetaminophen and ibuprofen).  Discussed alternate dosing and max dosing. 2. Apply heat and/or ice to affected area.   - Wear compression wrap to provide stability to joint and  allow for full healing. - START ankle exercises. Consider PT for strengthening 3. May also apply a muscle rub with lidocaine or lidocaine patch after heat or ice. 4. Followup 2-4 weeks as needed.   Follow up plan: Return if symptoms worsen or fail to improve.  Wilhelmina Mcardle, DNP, AGPCNP-BC Adult Gerontology Primary Care Nurse Practitioner [facility] 05/21/2017, 1:03 PM

## 2017-05-02 NOTE — Patient Instructions (Addendum)
Caitlin Barrett, Thank you for coming in to clinic today.  1. You have a left ankle effusion. Likely caused by your sprain and reinjury in September - Start taking Tylenol extra strength 1 to 2 tablets every 6-8 hours for aches or fever/chills for next few days as needed.  Do not take more than 3,000 mg in 24 hours from all medicines.  May take Ibuprofen as well if tolerated 200-443m every 8 hours as needed. May alternate tylenol and ibuprofen in same day. - Use heat and ice.  Apply this for 15 minutes at a time 6-8 times per day.   - Muscle rub with lidocaine, lidocaine patch, Biofreeze, or tiger balm for topical pain relief.  Avoid using this with heat and ice to avoid burns. - Wear a compression wrap on your ankle.  2. Can consider PT or orthopedics if you continue to have re-injury  Please schedule a follow-up appointment with LCassell Smiles AGNP. Return if symptoms worsen or fail to improve.  If you have any other questions or concerns, please feel free to call the clinic or send a message through [place] You may also schedule an earlier appointment if necessary.  You will receive a survey after today's visit either digitally by e-mail or paper by [employer] Your experiences and feedback matter to [place]  Please respond so we know how we are doing as we provide care for you.   LCassell Smiles DNP, AGNP-BC Adult Gerontology Nurse Practitioner [facility]   Ankle Exercises Ask your health care provider which exercises are safe for you. Do exercises exactly as told by your health care provider and adjust them as directed. It is normal to feel mild stretching, pulling, tightness, or discomfort as you do these exercises, but you should stop right away if you feel sudden pain or your pain gets worse. Do not begin these exercises until told by your health care provider. Stretching and range of motion exercises These exercises warm up your muscles and joints and improve the  movement and flexibility of your ankle. These exercises also help to relieve pain, numbness, and tingling. Exercise A: Dorsiflexion/Plantar Flexion  1. Sit with your ___Left__ knee straight or bent. Do not rest your foot on anything. 2. Flex your __Left____ ankle to tilt the top of your foot toward your shin. 3. Hold this position for __15-30___ seconds. 4. Point your toes downward to tilt the top of your foot away from your shin. 5. Hold this position for ___15-30___ seconds. Repeat __10_____ times. Complete this exercise ___2___ times a day. Exercise B: Ankle Alphabet  1. Sit with your __________ foot supported at your lower leg. ? Do not rest your foot on anything. ? Make sure your foot has room to move freely. 2. Think of your __________ foot as a paintbrush, and move your foot to trace each letter of the alphabet in the air. Keep your hip and knee still while you trace. Make the letters as large as you can without increasing any discomfort. 3. Trace every letter from A to Z. Repeat __________ times. Complete this exercise __________ times a day. Exercise C: Ankle Dorsiflexion, Passive 1. Sit on a chair that is placed on a non-carpeted surface. 2. Place your __________ foot on the floor, directly under your __________ knee. Extend your __________ leg for support. 3. Keeping your heel down, slide your __________ foot back toward the chair until you feel a stretch at your ankle or calf. If you do  not feel a stretch, slide your buttocks forward to the edge of the chair. 4. Hold this stretch for __________ seconds. Repeat __________ times. Complete this stretch __________ times a day. Strengthening exercises These exercises build strength and endurance in your ankle. Endurance is the ability to use your muscles for a long time, even after they get tired. Exercise D: Dorsiflexors  1. Secure a rubber exercise band or tube to an object, such as a table leg, that will stay still when the band  is pulled. Secure the other end around your __________ foot. 2. Sit on the floor, facing the object with your __________ leg extended. The band or tube should be slightly tense when your foot is relaxed. 3. Slowly flex your __________ ankle and toes to bring your foot toward you. 4. Hold this position for __________ seconds. 5. Slowly return your foot to the starting position, controlling the band as you do that. Repeat __________ times. Complete this exercise __________ times a day. Exercise E: Plantar Flexors  1. Sit on the floor with your __________ leg extended. 2. Loop a rubber exercise band or tube around the ball of your __________ foot. The ball of your foot is on the walking surface, right under your toes. The band or tube should be slightly tense when your foot is relaxed. 3. Slowly point your toes downward, pushing them away from you. 4. Hold this position for __________ seconds. 5. Slowly release the tension in the band or tube, controlling smoothly until your foot is back in the starting position. Repeat __________ times. Complete this exercise __________ times a day. Exercise F: Towel Curls  1. Sit in a chair on a non-carpeted surface, and put your feet on the floor. 2. Place a towel in front of your feet. If told by your health care provider, add __________ to the end of the towel. 3. Keeping your heel on the floor, put your __________ foot on the towel. 4. Pull the towel toward you by grabbing the towel with your toes and curling them under. Keep your heel on the floor. 5. Let your toes relax. 6. Grab the towel again. Keep going until the towel is completely underneath your foot. Repeat __________ times. Complete this exercise __________ times a day. Exercise G: Heel Raise ( Plantar Flexors, Standing) 1. Stand with your feet shoulder-width apart. 2. Keep your weight spread evenly over the width of your feet while you rise up on your toes. Use a wall or table to steady  yourself, but try not to use it for support. 3. If this exercise is too easy, try these options: ? Shift your weight toward your __________ leg until you feel challenged. ? If told by your health care provider, lift your uninjured leg off the floor. 4. Hold this position for __________ seconds. Repeat __________ times. Complete this exercise __________ times a day. Exercise H: Tandem Walking 1. Stand with one foot directly in front of the other. 2. Slowly raise your back foot up, lifting your heel before your toes, and place it directly in front of your other foot. 3. Continue to walk in this heel-to-toe way for __________ or for as long as told by your health care provider. Have a countertop or wall nearby to use if needed to keep your balance, but try not to hold onto anything for support. Repeat __________ times. Complete this exercises __________ times a day. This information is not intended to replace advice given to you by your health care  provider. Make sure you discuss any questions you have with your health care provider. Document Released: 01/24/2005 Document Revised: 11/10/2015 Document Reviewed: 11/28/2014 Elsevier Interactive Patient Education  2018 [employer].

## 2017-05-06 ENCOUNTER — Ambulatory Visit: Payer: PRIVATE HEALTH INSURANCE

## 2017-05-13 ENCOUNTER — Ambulatory Visit: Payer: PRIVATE HEALTH INSURANCE

## 2017-05-16 ENCOUNTER — Ambulatory Visit: Payer: PRIVATE HEALTH INSURANCE | Attending: Nurse Practitioner

## 2017-05-16 DIAGNOSIS — M62838 Other muscle spasm: Secondary | ICD-10-CM | POA: Diagnosis present

## 2017-05-16 DIAGNOSIS — M6281 Muscle weakness (generalized): Secondary | ICD-10-CM | POA: Insufficient documentation

## 2017-05-16 DIAGNOSIS — M791 Myalgia, unspecified site: Secondary | ICD-10-CM | POA: Diagnosis present

## 2017-05-16 DIAGNOSIS — R278 Other lack of coordination: Secondary | ICD-10-CM | POA: Insufficient documentation

## 2017-05-16 NOTE — Patient Instructions (Signed)
   Stabilization: Sit to Stand Transfer, Pelvic Floor Contraction    Sit, feet flat, contract pelvic floor as if stopping urination. Bend forward at hips, stand. Make sure you breathe out as you stand!  Copyright  VHI. All rights reserved.      Make sure you breathe out as you stand! Getting In/out of bed     Lying on back, bend left knee and place left arm across chest. Roll all in one movement to the right. Reverse to roll to the left. Always move as one unit.     Once you are lying on you side, move legs to edge of bed. Pull in the pelvic floor and lower tummy and push down with both hands while moving legs off bed to reach sitting position.  *Reverse sequence to return to lying down.  Copyright  VHI. All rights reserved.       Every time you do an activity where you are increasing abdominal pressure (such as pushing a heavy grocery cart) exhale to start the motion to "release the pressure out the top" instead of putting extra pressure on the pelvic floor.  Heel-lift: wear for 2 hours day one and increase by 1-2 hours per day until you reach your normal shoe wear time to let your body adapt to the difference, it may cause a little soreness at first but this should decrease within a few days.  Kegel exercises:    With neutral spine, tighten pelvic floor by imagining you are stopping the flow of urine, squeezing only around the vagina and anus.  Quick-Flicks: Breathe in deeply, pull up and in with pelvic floor muscles on exhale and then relax allowing just enough time for the muscles to full lengthen before the next contraction. Do _10__ repetitions in a row, stopping if the pelvic floor muscle gets tired and other muscles try to take over.  Long-Holds: Hold for _3__ seconds and then fully release, repeat _5__ times.   Repeat both of these exercises _5__ times throughout the day     * put a pillow under your hips to reverse gravity and help bring the pelvic organs  up.

## 2017-05-16 NOTE — Therapy (Signed)
but constipation is still a big problem. 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 Continue per POC.    Clinical Presentation  Stable    Clinical Decision Making  Moderate    Rehab Potential  Good    Clinical Impairments Affecting Rehab Potential  depression, menopause    PT Frequency  1x / week    PT Duration  12 weeks    PT Treatment/Interventions  ADLs/Self Care Home Management;Biofeedback;Aquatic Therapy;Electrical Stimulation;Moist Heat;Traction;Functional mobility training;Therapeutic activities;Therapeutic exercise;Balance training;Neuromuscular re-education;Manual techniques;Patient/family education;Dry needling;Taping    PT Next Visit Plan  assess abdomen, reasess SIJ tenderness, perform sacral mobs if swelling and tenderness decreased, teach TA contraction in quad or mod/quad. pelvic tilts on ball.     PT Home Exercise Plan  diaphragmatic breathing, adductor stretch, child's pose, lengthening kegels (3 sec long-holds), sit-to-stand, squat form, log-roll, soda-can, squatty potty     Consulted and Agree with Plan of Care  Patient       Patient will benefit from skilled therapeutic intervention in order to improve the following deficits and impairments:  Increased fascial restricitons, Improper body mechanics, Pain, Decreased coordination, Decreased mobility, Decreased scar mobility, Increased muscle spasms, Postural dysfunction, Decreased activity tolerance, Decreased endurance, Decreased range of motion, Decreased strength, Decreased balance, Obesity  Visit Diagnosis: Other muscle spasm  Other lack of coordination  Muscle weakness (generalized)  Myalgia     Problem List Patient Active Problem  List   Diagnosis Date Noted  . Monilial vaginitis 02/26/2017  . Vaginal dryness 02/26/2017  . Decreased libido 02/26/2017  . Anxiety with depression 02/26/2017  . Dyspareunia, female 02/26/2017  . S/P TAH (total abdominal hysterectomy) 07/02/2016  . 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 Nocturia 07/21/2015  . Stress incontinence 07/21/2015  . 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 Allergic rhinitis 06/21/2015  . Obesity (BMI 30-39.9) 06/21/2015   Willa Rough DPT, ATC Willa Rough 05/16/2017, 6:42 PM  [place] MAIN [facility] SERVICES [address] [place], [place], [address] Phone: [phone]   Fax:  [phone]  Name: Caitlin Barrett MRN: [id number] Date of Birth: 10-Jul-1970  [place] MAIN [facility] SERVICES [address] [place], [place], [address] Phone: [phone]   Fax:  [phone]  Physical Therapy Treatment  Patient Details  Name: Caitlin Barrett MRN: [id number] Date of Birth: February 27, 1971 Referring Provider: Cassell Smiles   Encounter Date: 05/16/2017  PT End of Session - 05/16/17 1800    Visit Number  2    Number of Visits  12    Date for PT Re-Evaluation  07/15/17    PT Start Time  9295    PT Stop Time  1710    PT Time Calculation (min)  65 min    Activity Tolerance  Patient tolerated treatment well    Behavior During Therapy  [facility] for tasks assessed/performed       Past Medical History:  Diagnosis Date  . Allergy    seasonal.  Takes allergy shots at [facility]  . Anemia   . Anxiety   . Arthritis   . Asthma    exercise induced--no inhalers  . Cancer ([place]) 2010   Basal cell skin cancer Lt eye, skin graft above Lt eye  . Depression   . Dysmenorrhea   . Headache   . Hypothyroid    Nodules and Fine needle aspiration  . IBS (irritable bowel syndrome)   . Painful menstrual periods   . Plantar fasciitis   . Sprain of carpal (joint) of wrist     Past Surgical History:  Procedure Laterality Date  . ABDOMINAL HYSTERECTOMY Bilateral 07/02/2016   Procedure: HYSTERECTOMY ABDOMINAL WITH BILATERAL SALPINGECTOMY-MIDLINE INCISION;  Surgeon: Brayton Mars, MD;  Location: [facility] ORS;  Service: Gynecology;  Laterality: Bilateral;  . CHOLECYSTECTOMY  2009  . HERNIA REPAIR  [id number]   umbilical  . SKIN GRAFT  2010  . TONSILLECTOMY      There were no vitals filed for this visit.   Pelvic Floor Physical Therapy Treatment Note  SCREENING  Changes in medications, allergies, or medical history?: no     SUBJECTIVE  Patient reports: Feels like the breathing right before bed made her have to get up to pee during the night. Has had less urgency but has had more constipation, still going daily but having  a harder time passing it.   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 Does not know if there is any scooliosis. She feels that her back has been bothering her more since gaining weight, her pelvis tilts forward more from extra weight.  Pain update:  Location of pain: lower pelvis, R>L Current pain:  0/10  Max pain:  4/10 Least pain:  0/10 Nature of pain: achy  Patient Goals: Gain control over her bladder. Return to exercise without leakage, painless intercourse.   OBJECTIVE  Posture/Observations:  R shoulder lower, weight shifted forward over toes L leg appears longer in standing  Range of Motion/Flexibilty:  Spine: Repeated extension increased pain up to a 3/10 R shoulder posteriorly rotates with extension, R side-bending decreased mildly compared to L. Hips: R facing, posteriorly rotated pelvis in standing.  INTERVENTIONS THIS SESSION: Theract: educated on and practiced sit-to-stand, log-roll, and squat/lift form with understanding that the pelvic brace needs to come on with exhale to decrease downward pressure on the  pelvic organs. Educated on and practiced kegels with hips elevated to decrease prolapse and to begin to gently lengthen and strengthen the PFM. Assessed spine ROM and direction preference and  [place] MAIN [facility] SERVICES [address] [place], [place], [address] Phone: [phone]   Fax:  [phone]  Physical Therapy Treatment  Patient Details  Name: Caitlin Barrett MRN: [id number] Date of Birth: February 27, 1971 Referring Provider: Cassell Smiles   Encounter Date: 05/16/2017  PT End of Session - 05/16/17 1800    Visit Number  2    Number of Visits  12    Date for PT Re-Evaluation  07/15/17    PT Start Time  9295    PT Stop Time  1710    PT Time Calculation (min)  65 min    Activity Tolerance  Patient tolerated treatment well    Behavior During Therapy  [facility] for tasks assessed/performed       Past Medical History:  Diagnosis Date  . Allergy    seasonal.  Takes allergy shots at [facility]  . Anemia   . Anxiety   . Arthritis   . Asthma    exercise induced--no inhalers  . Cancer ([place]) 2010   Basal cell skin cancer Lt eye, skin graft above Lt eye  . Depression   . Dysmenorrhea   . Headache   . Hypothyroid    Nodules and Fine needle aspiration  . IBS (irritable bowel syndrome)   . Painful menstrual periods   . Plantar fasciitis   . Sprain of carpal (joint) of wrist     Past Surgical History:  Procedure Laterality Date  . ABDOMINAL HYSTERECTOMY Bilateral 07/02/2016   Procedure: HYSTERECTOMY ABDOMINAL WITH BILATERAL SALPINGECTOMY-MIDLINE INCISION;  Surgeon: Brayton Mars, MD;  Location: [facility] ORS;  Service: Gynecology;  Laterality: Bilateral;  . CHOLECYSTECTOMY  2009  . HERNIA REPAIR  [id number]   umbilical  . SKIN GRAFT  2010  . TONSILLECTOMY      There were no vitals filed for this visit.   Pelvic Floor Physical Therapy Treatment Note  SCREENING  Changes in medications, allergies, or medical history?: no     SUBJECTIVE  Patient reports: Feels like the breathing right before bed made her have to get up to pee during the night. Has had less urgency but has had more constipation, still going daily but having  a harder time passing it.   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 Does not know if there is any scooliosis. She feels that her back has been bothering her more since gaining weight, her pelvis tilts forward more from extra weight.  Pain update:  Location of pain: lower pelvis, R>L Current pain:  0/10  Max pain:  4/10 Least pain:  0/10 Nature of pain: achy  Patient Goals: Gain control over her bladder. Return to exercise without leakage, painless intercourse.   OBJECTIVE  Posture/Observations:  R shoulder lower, weight shifted forward over toes L leg appears longer in standing  Range of Motion/Flexibilty:  Spine: Repeated extension increased pain up to a 3/10 R shoulder posteriorly rotates with extension, R side-bending decreased mildly compared to L. Hips: R facing, posteriorly rotated pelvis in standing.  INTERVENTIONS THIS SESSION: Theract: educated on and practiced sit-to-stand, log-roll, and squat/lift form with understanding that the pelvic brace needs to come on with exhale to decrease downward pressure on the  pelvic organs. Educated on and practiced kegels with hips elevated to decrease prolapse and to begin to gently lengthen and strengthen the PFM. Assessed spine ROM and direction preference and

## 2017-05-20 ENCOUNTER — Ambulatory Visit: Payer: PRIVATE HEALTH INSURANCE

## 2017-05-20 DIAGNOSIS — M6281 Muscle weakness (generalized): Secondary | ICD-10-CM

## 2017-05-20 DIAGNOSIS — M62838 Other muscle spasm: Secondary | ICD-10-CM | POA: Diagnosis not present

## 2017-05-20 DIAGNOSIS — M791 Myalgia, unspecified site: Secondary | ICD-10-CM

## 2017-05-20 DIAGNOSIS — R278 Other lack of coordination: Secondary | ICD-10-CM

## 2017-05-20 NOTE — Therapy (Signed)
Stimulation;Moist Heat;Traction;Functional mobility training;Therapeutic activities;Therapeutic exercise;Balance training;Neuromuscular re-education;Manual techniques;Patient/family education;Dry needling;Taping    PT Next Visit Plan  perform sacral mobs if swelling and tenderness decreased, PA to thoracic spine, towel roll, retractions, pelvic tilts on ball.     PT Home Exercise Plan  diaphragmatic breathing, adductor stretch, child's pose, lengthening kegels (3 sec long-holds), sit-to-stand, squat form, log-roll, soda-can, squatty potty , TA in quadruped    Consulted and Agree with Plan of Care  Patient       Patient will benefit from skilled therapeutic intervention in order to improve the following deficits and impairments:  Increased fascial restricitons, Improper body mechanics, Pain, Decreased coordination, Decreased mobility, Decreased scar mobility, Increased muscle spasms, Postural dysfunction, Decreased activity tolerance, Decreased endurance, Decreased range of motion, Decreased strength, Decreased balance, Obesity  Visit Diagnosis: Other muscle spasm  Other lack of coordination  Muscle weakness (generalized)  Myalgia     Problem List Patient Active Problem List   Diagnosis Date Noted  . Monilial vaginitis 02/26/2017  . Vaginal dryness 02/26/2017  . Decreased libido 02/26/2017  . Anxiety with depression 02/26/2017  . Dyspareunia, female 02/26/2017  . S/P TAH (total abdominal hysterectomy) 07/02/2016  . 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 Nocturia 07/21/2015  . Stress incontinence 07/21/2015  . 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 Allergic rhinitis 06/21/2015  . Obesity (BMI 30-39.9) 06/21/2015   Willa Rough DPT, ATC Willa Rough 05/21/2017, 12:03 PM  [place] MAIN [facility] SERVICES [address] [place], [place], [address] Phone:  [phone]   Fax:  [phone]  Name: OMOLOLA MITTMAN MRN: [id number] Date of Birth: [birth year]  [place] MAIN [facility] SERVICES [address] [place], [place], [address] Phone: [phone]   Fax:  [phone]  Physical Therapy Treatment  Patient Details  Name: Caitlin Barrett MRN: [id number] Date of Birth: 30-Dec-1970 Referring Provider: Cassell Smiles   Encounter Date: 05/20/2017  PT End of Session - 05/21/17 1150    Visit Number  3    Number of Visits  12    Date for PT Re-Evaluation  07/15/17    PT Start Time  8099    PT Stop Time  1705    PT Time Calculation (min)  55 min    Activity Tolerance  Patient tolerated treatment well    Behavior During Therapy  [facility] for tasks assessed/performed       Past Medical History:  Diagnosis Date  . Allergy    seasonal.  Takes allergy shots at [facility]  . Anemia   . Anxiety   . Arthritis   . Asthma    exercise induced--no inhalers  . Cancer ([place]) 2010   Basal cell skin cancer Lt eye, skin graft above Lt eye  . Depression   . Dysmenorrhea   . Headache   . Hypothyroid    Nodules and Fine needle aspiration  . IBS (irritable bowel syndrome)   . Painful menstrual periods   . Plantar fasciitis   . Sprain of carpal (joint) of wrist     Past Surgical History:  Procedure Laterality Date  . ABDOMINAL HYSTERECTOMY Bilateral 07/02/2016   Procedure: HYSTERECTOMY ABDOMINAL WITH BILATERAL SALPINGECTOMY-MIDLINE INCISION;  Surgeon: Brayton Mars, MD;  Location: [facility] ORS;  Service: Gynecology;  Laterality: Bilateral;  . CHOLECYSTECTOMY  2009  . HERNIA REPAIR  [id number]   umbilical  . SKIN GRAFT  2010  . TONSILLECTOMY      There were no vitals filed for this visit.    Pelvic Floor Physical Therapy Treatment Note  SCREENING  Changes in medications, allergies, or medical history?: no     SUBJECTIVE  Patient reports: Her heel has been a little sore from the heel-lift but otherwise it feels ok. Is still getting used to the arch cookie. Her schedule has been different, has not been  walking as much. Only had to get up once the first night instead of 2-3 times to go to the bathroom. Has only done the pillow-under-hips pfm contractions due to traveling.   Pain update:  Location of pain: Low back, pelvic pain (3/10, came and went).  Current pain:  2/10  Max pain:  3/10 Least pain:  1/10 Nature of pain: achy  Patient Goals: Gain control over her bladder. Return to exercise without leakage, painless intercourse.   OBJECTIVE  Strength/MMT:  LE MMT  LE MMT Left Right  Hip flex:  (L2) /5 /5  Hip ext: 5/5 4/5  Hip abd: 5/5 5/5  Hip add: 5/5 5/5  Hip IR 4/5 4+/5  Hip ER 5/5 4+/5     Abdominal:  Palpation: TTP to Psoas B Diastasis: 2 fingers at and below umbilicus none above.  Changes in: Posture/Observations:  LLE appears longer in standing but less so in supine. Heel-lift helps but it appears that there is an up-slip of LLE so manual correction done to improve pelvic alignment to allow improved balance ant to allow more relaxation of the PFM.  INTERVENTIONS THIS SESSION: Manual: TP release to Psoas and iliacus B. Sustained pull and quick-pull to L LE for improved pelvic alignment. Therex: Educated on and  Stimulation;Moist Heat;Traction;Functional mobility training;Therapeutic activities;Therapeutic exercise;Balance training;Neuromuscular re-education;Manual techniques;Patient/family education;Dry needling;Taping    PT Next Visit Plan  perform sacral mobs if swelling and tenderness decreased, PA to thoracic spine, towel roll, retractions, pelvic tilts on ball.     PT Home Exercise Plan  diaphragmatic breathing, adductor stretch, child's pose, lengthening kegels (3 sec long-holds), sit-to-stand, squat form, log-roll, soda-can, squatty potty , TA in quadruped    Consulted and Agree with Plan of Care  Patient       Patient will benefit from skilled therapeutic intervention in order to improve the following deficits and impairments:  Increased fascial restricitons, Improper body mechanics, Pain, Decreased coordination, Decreased mobility, Decreased scar mobility, Increased muscle spasms, Postural dysfunction, Decreased activity tolerance, Decreased endurance, Decreased range of motion, Decreased strength, Decreased balance, Obesity  Visit Diagnosis: Other muscle spasm  Other lack of coordination  Muscle weakness (generalized)  Myalgia     Problem List Patient Active Problem List   Diagnosis Date Noted  . Monilial vaginitis 02/26/2017  . Vaginal dryness 02/26/2017  . Decreased libido 02/26/2017  . Anxiety with depression 02/26/2017  . Dyspareunia, female 02/26/2017  . S/P TAH (total abdominal hysterectomy) 07/02/2016  . 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 Nocturia 07/21/2015  . Stress incontinence 07/21/2015  . 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 Allergic rhinitis 06/21/2015  . Obesity (BMI 30-39.9) 06/21/2015   Willa Rough DPT, ATC Willa Rough 05/21/2017, 12:03 PM  [place] MAIN [facility] SERVICES [address] [place], [place], [address] Phone:  [phone]   Fax:  [phone]  Name: OMOLOLA MITTMAN MRN: [id number] Date of Birth: [birth year]

## 2017-05-20 NOTE — Patient Instructions (Signed)
   Breathe in, let belly relax down toward the floor and then breathe out, pulling the lower belly in toward the backbone.   Repeat this _10x3__ times _1-3__ times per day

## 2017-05-30 ENCOUNTER — Ambulatory Visit: Payer: PRIVATE HEALTH INSURANCE

## 2017-06-03 ENCOUNTER — Ambulatory Visit: Payer: PRIVATE HEALTH INSURANCE | Attending: Nurse Practitioner

## 2017-06-03 DIAGNOSIS — M6281 Muscle weakness (generalized): Secondary | ICD-10-CM

## 2017-06-03 DIAGNOSIS — M62838 Other muscle spasm: Secondary | ICD-10-CM | POA: Insufficient documentation

## 2017-06-03 DIAGNOSIS — R278 Other lack of coordination: Secondary | ICD-10-CM | POA: Diagnosis present

## 2017-06-03 DIAGNOSIS — M791 Myalgia, unspecified site: Secondary | ICD-10-CM | POA: Insufficient documentation

## 2017-06-03 NOTE — Therapy (Signed)
[facility] MAIN [facility] SERVICES [address] [place], [place], [address] Phone: [phone]   Fax:  [phone]  Physical Therapy Treatment  Patient Details  Name: Caitlin Barrett MRN: [id number] Date of Birth: 1970/11/05 Referring Provider: Wilhelmina Mcardle   Encounter Date: 06/03/2017  PT End of Session - 06/03/17 1626    Visit Number  4    Number of Visits  12    Date for PT Re-Evaluation  07/15/17    PT Start Time  1511    PT Stop Time  1607    PT Time Calculation (min)  56 min    Activity Tolerance  Patient tolerated treatment well    Behavior During Therapy  [facility] for tasks assessed/performed       Past Medical History:  Diagnosis Date  . Allergy    seasonal.  Takes allergy shots at [facility]  . Anemia   . Anxiety   . Arthritis   . Asthma    exercise induced--no inhalers  . Cancer (HCC) 2010   Basal cell skin cancer Lt eye, skin graft above Lt eye  . Depression   . Dysmenorrhea   . Headache   . Hypothyroid    Nodules and Fine needle aspiration  . IBS (irritable bowel syndrome)   . Painful menstrual periods   . Plantar fasciitis   . Sprain of carpal (joint) of wrist     Past Surgical History:  Procedure Laterality Date  . ABDOMINAL HYSTERECTOMY Bilateral 07/02/2016   Procedure: HYSTERECTOMY ABDOMINAL WITH BILATERAL SALPINGECTOMY-MIDLINE INCISION;  Surgeon: Herold Harms, MD;  Location: [facility] ORS;  Service: Gynecology;  Laterality: Bilateral;  . CHOLECYSTECTOMY  2009  . HERNIA REPAIR  1974   umbilical  . SKIN GRAFT  2010  . TONSILLECTOMY      There were no vitals filed for this visit.    Pelvic Floor Physical Therapy Treatment Note  SCREENING  Changes in medications, allergies, or medical history?: no     SUBJECTIVE  Patient reports: She has not been quite as diligent with her HEP over the past week due to being sick with a stomach bug but was able to get some insoles for her shoes. She is not wearing  her inserts in her work shoes yet.  Pain update:  Location of pain: Lower abdomen Current pain:  0/10  Max pain:  2-3/10 Least pain:  0/10 Nature of pain: stabbing  Patient Goals: Gain control over her bladder. Return to exercise without leakage, painless intercourse.    OBJECTIVE  Changes in: Posture/Observations:  Forward head and shoulders, anterior pelvic tilt.  Range of Motion/Flexibilty:  Decreased hip extension making it difficult for Pt. To achieve neutral pelvic tilt in standing.  INTERVENTIONS THIS SESSION: Manual: grade 3-4 PA mobs to sacrum, thoracic, and thoraco-lumbar junction to improve mobility and allow for improved posture and TA recruitment for better pelvic position and decreased PFM spasm. Therex: Educated on and practiced shoulder retractions with and without band and chin retractions 3x10 to improve posture and allow fro better pelvic position for decreased PFM tightness. Educayted on and practiced thoracic extensions over towel roll 3x20 to improve thoracic mobility and allow for improved posture, TA recruitment and relaxation of PFM for decreased pain.  NM re-ed: Educated patient on posture with ambulation and worked on pelvic tilt in seated and standing to decrease stress on pelvis and improve posture so the PFM can relax.  Total time: 56 min.  PT Education - 06/03/17 1625    Education provided  Yes    Education Details  See Pt. instructions and Interventons this session.    Person(s) Educated  Patient    Methods  Explanation;Tactile cues;Verbal cues;Demonstration    Comprehension  Verbalized understanding;Returned demonstration;Verbal cues required;Tactile cues required       PT Short Term Goals - 04/22/17 1641      PT SHORT TERM GOAL #1   Title  Patient will demonstrate a coordinated contraction, relaxation, and bulge of the pelvic floor muscles to demonstrate functional recruitment and motion and  allow for further strengthening.    Time  6    Period  Weeks    Status  New    Target Date  06/03/17      PT SHORT TERM GOAL #2   Title  Patient will demonstrate appropriate body mechanics with bending and lifting heavy objects to allow for decreased stress on the pelvic floor and low back.     Time  6    Period  Weeks    Status  New    Target Date  06/03/17      PT SHORT TERM GOAL #3   Title  Patient will demonstrate HEP x1 in the clinic to demonstrate understanding and proper form to allow for further improvement.    Time  6    Period  Weeks    Status  New    Target Date  06/03/17        PT Long Term Goals - 04/22/17 1643      PT LONG TERM GOAL #1   Title  Patient will report no episodes of UI over the course of the prior two weeks to demonstrate improved functional ability.    Time  12    Period  Weeks    Status  New    Target Date  07/15/17      PT LONG TERM GOAL #2   Title  Patient will report no pain with intercourse to demonstrate improved functional ability.    Time  12    Period  Weeks    Status  New    Target Date  07/15/17      PT LONG TERM GOAL #3   Title  "Patient will score at or below 64 on the PFDI and 8/43 on the Female NIH-CPSI to demonstrate a clinically meaningful decrease in disability and distress due to pelvic floor dysfunction.    Time  12    Period  Weeks    Status  New    Target Date  07/15/17            Plan - 06/03/17 1626    Clinical Impression Statement  Patient is making some progress at home but has been sick over past week and not as diligent with her HEP as normal. Within-session improvement in posture through spinal mobilization, training ,and postural exersizes. Patient will benefit from further strengthening and manual treatment to decrease forward head along with internal PFM TP release and strengthening. Continue per POC.    Clinical Presentation  Stable    Clinical Decision Making  Moderate    Rehab Potential  Good     Clinical Impairments Affecting Rehab Potential  depression, menopause    PT Frequency  1x / week    PT Duration  12 weeks    PT Treatment/Interventions  ADLs/Self Care Home Management;Biofeedback;Aquatic Therapy;Electrical Stimulation;Moist Heat;Traction;Functional mobility training;Therapeutic activities;Therapeutic exercise;Balance training;Neuromuscular re-education;Manual techniques;Patient/family education;Dry needling;Taping  PT Next Visit Plan  strengthening and manual treatment to decrease forward head along with internal PFM TP release and strengthening    PT Home Exercise Plan  diaphragmatic breathing, adductor stretch, child's pose, lengthening kegels (3 sec long-holds), sit-to-stand, squat form, log-roll, soda-can, squatty potty , TA in quadruped, pelvic tilts on ball, shoulder retractions, chin-tucks, rows, towel-roll extensions.    Consulted and Agree with Plan of Care  Patient       Patient will benefit from skilled therapeutic intervention in order to improve the following deficits and impairments:  Increased fascial restricitons, Improper body mechanics, Pain, Decreased coordination, Decreased mobility, Decreased scar mobility, Increased muscle spasms, Postural dysfunction, Decreased activity tolerance, Decreased endurance, Decreased range of motion, Decreased strength, Decreased balance, Obesity  Visit Diagnosis: Other muscle spasm  Other lack of coordination  Muscle weakness (generalized)  Myalgia     Problem List Patient Active Problem List   Diagnosis Date Noted  . Monilial vaginitis 02/26/2017  . Vaginal dryness 02/26/2017  . Decreased libido 02/26/2017  . Anxiety with depression 02/26/2017  . Dyspareunia, female 02/26/2017  . S/P TAH (total abdominal hysterectomy) 07/02/2016  . 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 Nocturia 07/21/2015  . Stress incontinence 07/21/2015  . 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 Allergic  rhinitis 06/21/2015  . Obesity (BMI 30-39.9) 06/21/2015   Cleophus Molt DPT, ATC Cleophus Molt 06/03/2017, 4:33 PM  [facility] MAIN [facility] SERVICES [address] [place], [place], [address] Phone: [phone]   Fax:  [phone]  Name: RIN NOVIELLO MRN: [id number] Date of Birth: March 07, 1971

## 2017-06-03 NOTE — Patient Instructions (Addendum)
  Shoulder Retraction and Downward Rotation   Rotate the shoulder blades back and down as if you had to hold a pencil between them, holding for 1 full second each time. Repeat this _10x2_ times _1-2_ times per day.      Start with Shoulder Retraction and Downward Rotation pictures above, holding the position while pulling the chin straight back as if trying to make a "double chin".  Breathe in forward and breathe out as you pull back, repeating this _10x2__ times _1-2___ times per day.    Do 20 tilts 1-2 times per day. Exhale as you tuck under, drawing the lower tummy muscles in with each exhale.    Keep arms next to your sides, leading the motion with the shoulder blades. Do 10x2 1-2 times per day.    Place foam roller or towel under your upper back between your shoulder blades. Support your head with your hands, elbows forward, and gently rock back and forth and side to side to improve motion in your back.   Move the foam roller or towel up and down to a few spots in the upper back, repeating the process.     * When walking, work on getting a slight "tuck of the pelvis to activate the lower tummy muscles (~50%) through the whole movement to strengthen these muscles and allow the pelvic floor muscles to relax!

## 2017-06-10 ENCOUNTER — Ambulatory Visit: Payer: PRIVATE HEALTH INSURANCE

## 2017-06-10 DIAGNOSIS — M6281 Muscle weakness (generalized): Secondary | ICD-10-CM

## 2017-06-10 DIAGNOSIS — R278 Other lack of coordination: Secondary | ICD-10-CM

## 2017-06-10 DIAGNOSIS — M62838 Other muscle spasm: Secondary | ICD-10-CM | POA: Diagnosis not present

## 2017-06-10 DIAGNOSIS — M791 Myalgia, unspecified site: Secondary | ICD-10-CM

## 2017-06-10 NOTE — Patient Instructions (Addendum)
Urge supression technique:  1) Take a deep breath to convince yourself that you are in control and calm the nervous system.  2) Do 5 "quick-flick" kegels (pelvic floor muscle contractions) and re-assess the urge. Repeat another set if urge is still present. 3) Once the urge has decreased, start walking calmly to the the bathroom. Stop and repeat steps 1 and 2 as many times as needed until you can successfully get to the toilet. 4) Only once seated, take a deep breath and allow the pelvic floor muscles to relax and allow for the urine to flow.    Do not be discouraged if you are not successful the first couple times, this is normal and it will take practice but remember that YOU are in control. Start by practicing this at home where you do not have to worry as much if there were to be an accident. Allowing yourself to get rushed or nervous puts the bladder back in control and will not allow the technique to work.   2) look into Duck boots and inserts with a rigid arch support.  3) with exercise, make sure that you are exhaling through the "tough" part of the movement and if you can get a gentle kegel in as you begin the motion (e.g. With squat) as well.

## 2017-06-10 NOTE — Therapy (Signed)
[facility] MAIN [facility] SERVICES [address] [place], [place], [address] Phone: [phone]   Fax:  [phone]  Physical Therapy Treatment  Patient Details  Name: Caitlin Barrett MRN: [id number] Date of Birth: 01-15-1971 Referring Provider: Wilhelmina Mcardle   Encounter Date: 06/10/2017  PT End of Session - 06/10/17 1621    Visit Number  5    Number of Visits  12    Date for PT Re-Evaluation  07/15/17    PT Start Time  1515    PT Stop Time  1615    PT Time Calculation (min)  60 min    Activity Tolerance  Patient tolerated treatment well    Behavior During Therapy  [facility] for tasks assessed/performed       Past Medical History:  Diagnosis Date  . Allergy    seasonal.  Takes allergy shots at [facility]  . Anemia   . Anxiety   . Arthritis   . Asthma    exercise induced--no inhalers  . Cancer (HCC) 2010   Basal cell skin cancer Lt eye, skin graft above Lt eye  . Depression   . Dysmenorrhea   . Headache   . Hypothyroid    Nodules and Fine needle aspiration  . IBS (irritable bowel syndrome)   . Painful menstrual periods   . Plantar fasciitis   . Sprain of carpal (joint) of wrist     Past Surgical History:  Procedure Laterality Date  . ABDOMINAL HYSTERECTOMY Bilateral 07/02/2016   Procedure: HYSTERECTOMY ABDOMINAL WITH BILATERAL SALPINGECTOMY-MIDLINE INCISION;  Surgeon: Herold Harms, MD;  Location: [facility] ORS;  Service: Gynecology;  Laterality: Bilateral;  . CHOLECYSTECTOMY  2009  . HERNIA REPAIR  1974   umbilical  . SKIN GRAFT  2010  . TONSILLECTOMY      There were no vitals filed for this visit.    Pelvic Floor Physical Therapy Treatment Note  SCREENING  Changes in medications, allergies, or medical history?: no     SUBJECTIVE  Patient reports: Had increased pressure after her personal training session day on Wednesday where she was doing a lot of "funtional exercises" including squats and planks. Has been doing  "pretty good" has noticed that she has a hard time getting to the bathroom in time when her bladder is really full. She sometimes has a hard time breathing when on her back and has woken up trying to catch her breath before.  Pain update: Her pain was "doing better" but it became flared up with her personal training session.   Patient Goals: Gain control over her bladder. Return to exercise without leakage, painless intercourse.   OBJECTIVE  Changes in: Posture/Observations:  Forward head and shoulders  Gait Analysis:  Boots "flop" around as she walks, they are heavy and do not encourage activation with the ground at toe-off.  INTERVENTIONS THIS SESSION: Manual: overpressure to anterior shoulders in supine for improved retraction ROM/ chest opening stretch. TP release to B upper traps, Pectoralis, deep neck extensors as well as PA grade 3-4 mobs to  C7 and AP mobs of upper cervical vertebrae via manual  And active chin retractions. Educated on performing chin retractions in supine and re-emphasized the importance of thoracic extensions over a towel to keep the cervical and thoracic spine mobile and allow for appropriate strengthening. Self care: Educated on how her current footwear is causing her more problems and discussed potential footwear options that would meet her needs and allow for improved toe-off, hip  extension, and decreased stress on the hip-flexors. Educated on how sleep-apnea affects her pelvic and overall health and the need to assess for and treat it if present.  Total time: 60 min.                         PT Education - 06/10/17 1619    Education provided  Yes    Education Details  See Pt. Instructions and interventions this session    Person(s) Educated  Patient    Methods  Explanation    Comprehension  Verbalized understanding;Returned demonstration       PT Short Term Goals - 04/22/17 1641      PT SHORT TERM GOAL #1   Title  Patient will  demonstrate a coordinated contraction, relaxation, and bulge of the pelvic floor muscles to demonstrate functional recruitment and motion and allow for further strengthening.    Time  6    Period  Weeks    Status  New    Target Date  06/03/17      PT SHORT TERM GOAL #2   Title  Patient will demonstrate appropriate body mechanics with bending and lifting heavy objects to allow for decreased stress on the pelvic floor and low back.     Time  6    Period  Weeks    Status  New    Target Date  06/03/17      PT SHORT TERM GOAL #3   Title  Patient will demonstrate HEP x1 in the clinic to demonstrate understanding and proper form to allow for further improvement.    Time  6    Period  Weeks    Status  New    Target Date  06/03/17        PT Long Term Goals - 04/22/17 1643      PT LONG TERM GOAL #1   Title  Patient will report no episodes of UI over the course of the prior two weeks to demonstrate improved functional ability.    Time  12    Period  Weeks    Status  New    Target Date  07/15/17      PT LONG TERM GOAL #2   Title  Patient will report no pain with intercourse to demonstrate improved functional ability.    Time  12    Period  Weeks    Status  New    Target Date  07/15/17      PT LONG TERM GOAL #3   Title  "Patient will score at or below 64 on the PFDI and 8/43 on the Female NIH-CPSI to demonstrate a clinically meaningful decrease in disability and distress due to pelvic floor dysfunction.    Time  12    Period  Weeks    Status  New    Target Date  07/15/17            Plan - 06/10/17 1622    Clinical Impression Statement  Patient responded well to education on modifications to her ftness exercises, sleep apnea, and her footwear decisions and demonstrated improved head position following manual therapy.     Clinical Presentation  Stable    Clinical Decision Making  Moderate    Rehab Potential  Good    Clinical Impairments Affecting Rehab Potential   depression, menopause    PT Frequency  1x / week    PT Duration  12 weeks    PT  Treatment/Interventions  ADLs/Self Care Home Management;Biofeedback;Aquatic Therapy;Electrical Stimulation;Moist Heat;Traction;Functional mobility training;Therapeutic activities;Therapeutic exercise;Balance training;Neuromuscular re-education;Manual techniques;Patient/family education;Dry needling;Taping    PT Next Visit Plan  internal PFM TP release and strengthening    PT Home Exercise Plan  diaphragmatic breathing, adductor stretch, child's pose, lengthening kegels (3 sec long-holds), sit-to-stand, squat form, log-roll, soda-can, squatty potty , TA in quadruped, pelvic tilts on ball, shoulder retractions, chin-tucks, rows, towel-roll extensions.    Consulted and Agree with Plan of Care  Patient       Patient will benefit from skilled therapeutic intervention in order to improve the following deficits and impairments:  Increased fascial restricitons, Improper body mechanics, Pain, Decreased coordination, Decreased mobility, Decreased scar mobility, Increased muscle spasms, Postural dysfunction, Decreased activity tolerance, Decreased endurance, Decreased range of motion, Decreased strength, Decreased balance, Obesity  Visit Diagnosis: Other muscle spasm  Other lack of coordination  Muscle weakness (generalized)  Myalgia     Problem List Patient Active Problem List   Diagnosis Date Noted  . Monilial vaginitis 02/26/2017  . Vaginal dryness 02/26/2017  . Decreased libido 02/26/2017  . Anxiety with depression 02/26/2017  . Dyspareunia, female 02/26/2017  . S/P TAH (total abdominal hysterectomy) 07/02/2016  . 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 Nocturia 07/21/2015  . Stress incontinence 07/21/2015  . 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 Allergic rhinitis 06/21/2015  . Obesity (BMI 30-39.9) 06/21/2015   Cleophus Molt DPT, ATC Cleophus Molt 06/10/2017, 4:36  PM  [facility] MAIN [facility] SERVICES [address] [place], [place], [address] Phone: [phone]   Fax:  [phone]  Name: Caitlin Barrett MRN: [id number] Date of Birth: [birth year]

## 2017-06-14 ENCOUNTER — Other Ambulatory Visit: Payer: Self-pay

## 2017-06-14 ENCOUNTER — Ambulatory Visit (INDEPENDENT_AMBULATORY_CARE_PROVIDER_SITE_OTHER): Payer: PRIVATE HEALTH INSURANCE | Admitting: Nurse Practitioner

## 2017-06-14 ENCOUNTER — Encounter: Payer: Self-pay | Admitting: Nurse Practitioner

## 2017-06-14 VITALS — BP 126/60 | HR 72 | Temp 98.3°F | Ht 68.0 in | Wt 272.8 lb

## 2017-06-14 DIAGNOSIS — Z23 Encounter for immunization: Secondary | ICD-10-CM | POA: Diagnosis not present

## 2017-06-14 DIAGNOSIS — S81852A Open bite, left lower leg, initial encounter: Secondary | ICD-10-CM | POA: Diagnosis not present

## 2017-06-14 DIAGNOSIS — B3731 Acute candidiasis of vulva and vagina: Secondary | ICD-10-CM

## 2017-06-14 DIAGNOSIS — W540XXA Bitten by dog, initial encounter: Secondary | ICD-10-CM

## 2017-06-14 DIAGNOSIS — B373 Candidiasis of vulva and vagina: Secondary | ICD-10-CM | POA: Diagnosis not present

## 2017-06-14 MED ORDER — AMOXICILLIN-POT CLAVULANATE 875-125 MG PO TABS: 1.0000 | ORAL_TABLET | Freq: Two times a day (BID) | ORAL | 0 refills | Status: AC

## 2017-06-14 MED ORDER — FLUCONAZOLE 150 MG PO TABS: 150.0000 mg | ORAL_TABLET | ORAL | 0 refills | Status: AC

## 2017-06-14 NOTE — Patient Instructions (Addendum)
Maceo Pro,   Thank you for coming in to clinic today.  START augmentin 875-125 mg tablet.  Take one tablet twice daily for 10 days to prevent a skin infection from your dog bite.  - Take diflucan 150 mg tablet once every 3 days for 2 doses if you are having a yeast infection from the antibiotic.  Please schedule a follow-up appointment with Cassell Smiles, AGNP. Return 5-7 days if symptoms worsen or fail to improve.  If you have any other questions or concerns, please feel free to call the clinic or send a message through [place]. You may also schedule an earlier appointment if necessary.  You will receive a survey after today's visit either digitally by e-mail or paper by [employer]. Your experiences and feedback matter to [place].  Please respond so we know how we are doing as we provide care for you.   Cassell Smiles, DNP, AGNP-BC Adult Gerontology Nurse Practitioner [facility], [facility]   Rabies Rabies is an infection that affects the brain and central nervous system. It is caused by a virus that can be carried by many kinds of animals. The virus can spread from an infected animal to a person through a bite. If you have been bitten by an animal with rabies, it is very important that you get treatment right away. The infection almost always results in death, but early treatment is effective at preventing the infection from developing. What are the causes? This condition is caused by a virus that can be carried by many kinds of animals, including dogs, cats, skunks, bats, woodchucks, raccoons, coyotes, and foxes. The virus spreads through the saliva of infected animals. Most people who get rabies get it from an animal bite. What are the signs or symptoms? Symptoms of this condition usually start 1-3 months after you are bitten. By the time symptoms start, it is usually too late for lifesaving treatment. Common symptoms include:  Headache.  Fever.  Fatigue and  weakness.  Agitation.  Anxiety.  Confusion.  Unusual behavior, such as hyperactivity, fear of water (hydrophobia), or fear of air (aerophobia).  Hallucinations.  Insomnia.  Weakness in the arms or legs.  Difficulty swallowing.  Most people who are treated right away will never have symptoms. How is this diagnosed? This condition is diagnosed based on symptoms. There is no way to reliably diagnose rabies before symptoms start, but your health care provider may order rabies tests. To test for rabies, your health care provider may take a sample of one or more of the following:  Saliva.  Blood.  Skin. Skin samples are taken with a needle.  Spinal fluid. Spinal fluid samples are taken with a needle that is inserted into your back.  How is this treated? Treatment is often started right away, even if it is not known for sure if the animal that bit you has rabies. Treatment, called post-exposure prophylaxis (PEP), aims to prevent the infection from developing. It involves:  Cleaning the wound.  Injections of a rabies immune globulin.  A series of rabies vaccine injections usually given over a 2-week period.  If the animal that bit you has been caught and is alive, it will be watched to see if it remains healthy. If the animal has been killed, it can be tested for rabies. Follow these instructions at home:  Keep all follow-up visits as told by your health care provider. This is important.  If the animal that bit you was tested for  rabies, ask your health care provider or the department performing the test when the test results will be ready. It is your responsibility to get the test results.  Follow instructions from your health care provider about how to take care of your wound. Make sure you: ? Wash your hands with soap and water before you change your bandage (dressing). If soap and water are not available, use hand sanitizer. ? Change your dressing as told by your health  care provider. ? Keep the wound dry for as long as told by your health care provider.  Keep the wound raised (elevated) above the level of your heart as much as possible.  Rest the injured area. Do not use the injured area until your health care provider says it is okay.  Take over-the-counter and prescription medicines only as told by your health care provider. How is this prevented?  Stay away from stray or wild animals.  If you are planning to travel to an area where rabies is common, or if your job or hobbies involve possible contact with wild or stray animals, consider getting the rabies vaccine.  Make sure your pet stays up to date with rabies vaccinations.  Watch your pets when they are outside. Keep them away from wild animals.  Report any stray animals to the local animal control services. This information is not intended to replace advice given to you by your health care provider. Make sure you discuss any questions you have with your health care provider. Document Released: 03/12/2005 Document Revised: 08/18/2015 Document Reviewed: 01/08/2015 Elsevier Interactive Patient Education  [employer].

## 2017-06-14 NOTE — Progress Notes (Signed)
Subjective:    Patient ID: Caitlin Barrett, female    DOB: [birth year], 47 y.o.   MRN: [id number]  Caitlin Barrett is a 47 y.o. female presenting on 06/14/2017 for Animal Bite (pt was bite by 1-2 dogs x 2 days ago (2) puncture bites on the left calf)   HPI Dog bite Patient reports to the clinic 2 days after being bitten by 2 dogs.  She reports having stopped to tell a neighbor their donkey was loose.  With no answer at the door, patient went to leave the property and was then attacked by the dogs.  The bites punctured through her tall rubber work boots.  The dogs were not up to date on vaccines, so they are being held in quarantine at the animal shelter. - Soreness, swelling, bruising is persistent on left lower leg posterior calf.  Patient reports she did have slight burning at site yesterday, but not persistent today.  Otherwise, she denies paresthesias.  She has also not had any headache, nausea, vomiting, confusion or tremor. -She owns a dog walking business, but has not had any rabies vaccination for herself.  She was not working when this injury occurred. - Last tdap > 5 years ago. Social History   Tobacco Use  . Smoking status: Never Smoker  . Smokeless tobacco: Never Used  Substance Use Topics  . Alcohol use: Yes    Comment: rare  . Drug use: No    Review of Systems Per HPI unless specifically indicated above     Objective:    BP 126/60 (BP Location: Right Arm, Patient Position: Sitting, Cuff Size: Large)   Pulse 72   Temp 98.3 F (36.8 C) (Oral)   Ht 5\' 8"  (1.727 m)   Wt 272 lb 12.8 oz (123.7 kg)   LMP 05/26/2016 (Exact Date)   BMI 41.48 kg/m   Wt Readings from Last 3 Encounters:  06/14/17 272 lb 12.8 oz (123.7 kg)  05/02/17 278 lb 9.6 oz (126.4 kg)  04/17/17 276 lb 8 oz (125.4 kg)    Physical Exam  Constitutional: She is oriented to person, place, and time. She appears well-developed and well-nourished. No distress.  HENT:  Head: Normocephalic and atraumatic.   Neck: Normal range of motion. Neck supple.  Cardiovascular: Normal rate and regular rhythm.  Pulmonary/Chest: Effort normal. No respiratory distress.  Neurological: She is alert and oriented to person, place, and time.  Skin: Skin is warm and dry. Ecchymosis and lesion noted. She is not diaphoretic.     Psychiatric: She has a normal mood and affect. Her behavior is normal.  Vitals reviewed.    Results for orders placed or performed in visit on 02/28/17  Testosterone, Free, Total, SHBG  Result Value Ref Range   Testosterone 14 8 - 48 ng/dL   Testosterone, Free 1.6 0.0 - 4.2 pg/mL   Sex Hormone Binding 35.2 24.6 - 122.0 nmol/L  Estradiol  Result Value Ref Range   Estradiol 5.3 pg/mL      Assessment & Plan:   Problem List Items Addressed This Visit    None    Visit Diagnoses    Dog bite of left lower leg, initial encounter    -  Primary Acute injury 2/2 dog bite to left lower extremity 2 days ago.  Wound currently exhibits no complication of infection, cellulitis.  Pt currently has no signs or symptoms of rabies.  Plan: 1. START Augmentin 875-125 mg one tablet every 12 hours for 10 days.  2. Continue good wound care with dressing as needed.  Monitor for s/sx of infection. 3. Monitor for s/sx of rabies - paresthesias at wound or neurological s/sx. Handout provided. 4. Followup as needed.     Relevant Medications   amoxicillin-clavulanate (AUGMENTIN) 875-125 MG tablet   Vaginal candidiasis     Patient routinely has vaginal candidiasis when taking penicillin based antibiotics.  Provided prescription for Diflucan 150 mg tablet.  Take 1 tablet every 3 days x2 doses.  Follow-up as needed.   Relevant Medications   fluconazole (DIFLUCAN) 150 MG tablet   Need for Tdap vaccination     Pt needs tetanus vaccine.  > 5 years since last vaccination and new skin injury 2 days ago.  Plan: 1. Reviewed tetanus disease and need for vaccination. 2. Administer vaccine today.     Relevant  Orders   Tdap vaccine greater than or equal to 7yo IM (Completed)      Meds ordered this encounter  Medications  . amoxicillin-clavulanate (AUGMENTIN) 875-125 MG tablet    Sig: Take 1 tablet by mouth 2 (two) times daily for 10 days.    Dispense:  20 tablet    Refill:  0    Order Specific Question:   Supervising Provider    Answer:   Smitty Cords [[id number]]  . fluconazole (DIFLUCAN) 150 MG tablet    Sig: Take 1 tablet (150 mg total) by mouth every 3 (three) days for 2 doses.    Dispense:  2 tablet    Refill:  0    Order Specific Question:   Supervising Provider    Answer:   Smitty Cords [[id number]]      Follow up plan: Return 5-7 days if symptoms worsen or fail to improve.  Wilhelmina Mcardle, DNP, AGPCNP-BC Adult Gerontology Primary Care Nurse Practitioner [facility] 06/14/2017, 11:10 AM

## 2017-06-17 ENCOUNTER — Encounter: Payer: Self-pay | Admitting: Nurse Practitioner

## 2017-06-17 ENCOUNTER — Ambulatory Visit: Payer: PRIVATE HEALTH INSURANCE

## 2017-06-17 DIAGNOSIS — R278 Other lack of coordination: Secondary | ICD-10-CM

## 2017-06-17 DIAGNOSIS — M62838 Other muscle spasm: Secondary | ICD-10-CM

## 2017-06-17 DIAGNOSIS — M6281 Muscle weakness (generalized): Secondary | ICD-10-CM

## 2017-06-17 DIAGNOSIS — M791 Myalgia, unspecified site: Secondary | ICD-10-CM

## 2017-06-17 NOTE — Patient Instructions (Signed)
   Breathe in, let belly relax down toward the floor and then breathe out, pulling the lower belly in toward the backbone.   Repeat this _10x3__ times __1_ times per day  Bracing With Arm / Leg Raise (Quadruped)    On hands and knees find neutral spine. Tighten pelvic floor and abdominals and hold. Alternating, lift arm to shoulder level and opposite leg to hip level. Repeat _10__ times. Do _2-3 times on each side._1_ times a day.

## 2017-06-17 NOTE — Therapy (Signed)
[facility] MAIN [facility] SERVICES [address] [place], [place], [address] Phone: [phone]   Fax:  [phone]  Physical Therapy Treatment  Patient Details  Name: Caitlin Barrett MRN: [id number] Date of Birth: [birth year] Referring Provider: Wilhelmina Mcardle   Encounter Date: 06/17/2017  PT End of Session - 06/17/17 1633    Visit Number  6    Number of Visits  12    Date for PT Re-Evaluation  07/15/17    PT Start Time  0305    PT Stop Time  0415    PT Time Calculation (min)  70 min    Activity Tolerance  Patient tolerated treatment well    Behavior During Therapy  [facility] for tasks assessed/performed       Past Medical History:  Diagnosis Date  . Allergy    seasonal.  Takes allergy shots at [facility]  . Anemia   . Anxiety   . Arthritis   . Asthma    exercise induced--no inhalers  . Cancer (HCC) 2010   Basal cell skin cancer Lt eye, skin graft above Lt eye  . Depression   . Dysmenorrhea   . Headache   . Hypothyroid    Nodules and Fine needle aspiration  . IBS (irritable bowel syndrome)   . Painful menstrual periods   . Plantar fasciitis   . Sprain of carpal (joint) of wrist     Past Surgical History:  Procedure Laterality Date  . ABDOMINAL HYSTERECTOMY Bilateral 07/02/2016   Procedure: HYSTERECTOMY ABDOMINAL WITH BILATERAL SALPINGECTOMY-MIDLINE INCISION;  Surgeon: Herold Harms, MD;  Location: [facility] ORS;  Service: Gynecology;  Laterality: Bilateral;  . CHOLECYSTECTOMY  2009  . HERNIA REPAIR  1974   umbilical  . SKIN GRAFT  2010  . TONSILLECTOMY      There were no vitals filed for this visit.   Pelvic Floor Physical Therapy Treatment Note  SCREENING  Changes in medications, allergies, or medical history?: Is on an anti-biotic for dog-bite short-term.     SUBJECTIVE  Patient reports: She was attacked by dogs on Friday and is having vaginal candidiasis due to antibiotics. She is having less back pain overall but  has not been as good about exercises over the last few days after attack. Felt that she could reduce neck/upper back pain with chin tucks in supine.   Pain update:  Location of pain: LB Current pain:  1/10  Max pain:  4/10 Least pain:  1/10 Nature of pain: ache  Patient Goals: Gain control over her bladder. Return to exercise without leakage, painless intercourse.   OBJECTIVE  Changes in: Posture/Observations:  Improved posture and Pt. Noted decreased pain/discomfort in upper back.   Strength/MMT:  Patient had reversed breathing with TA in quad but able to correct with cueing and demonstrates good activation.   Pelvic floor: TTP on R>L today with greater tenderness posteriorly through PR,IC, and coccygeus   INTERVENTIONS THIS SESSION: Self-care: discussed Pt. Speaking with a counselor about managing the fear following her recent dog attack so the anxiety and fear does not cause further pelvic floor tension. Reminded about the importance/usefulness of squatty-potty and educated on how to use her thumb to "splint" to help with BM.   NM re-ed: Reviewed TA in quadruped and introduced arms-only bird-dog to continue to improve coordination and recruitment of muscles surrounding the pelvis to allow for decreased spasm of the PFM for greater length and strength.  Manual: TP release to Coccygeus, PR, and  OI on L and through OI,IC,PR and coccygeus on R. R was more tender and took significantly longer to release than L.   Total time: 70 min.         No data recorded               PT Education - 06/17/17 1632    Education provided  Yes    Education Details  See Pt. Instructions and Interventions this session.    Person(s) Educated  Patient    Methods  Explanation;Tactile cues;Verbal cues;Handout    Comprehension  Verbalized understanding;Returned demonstration;Verbal cues required;Tactile cues required       PT Short Term Goals - 06/17/17 1638      PT SHORT  TERM GOAL #1   Title  Patient will demonstrate a coordinated contraction, relaxation, and bulge of the pelvic floor muscles to demonstrate functional recruitment and motion and allow for further strengthening.    Time  6    Period  Weeks    Status  Achieved    Target Date  06/03/17      PT SHORT TERM GOAL #2   Title  Patient will demonstrate appropriate body mechanics with bending and lifting heavy objects to allow for decreased stress on the pelvic floor and low back.     Time  6    Period  Weeks    Status  Achieved    Target Date  06/03/17      PT SHORT TERM GOAL #3   Title  Patient will demonstrate HEP x1 in the clinic to demonstrate understanding and proper form to allow for further improvement.    Time  6    Period  Weeks    Status  Partially Met    Target Date  06/03/17        PT Long Term Goals - 04/22/17 1643      PT LONG TERM GOAL #1   Title  Patient will report no episodes of UI over the course of the prior two weeks to demonstrate improved functional ability.    Time  12    Period  Weeks    Status  New    Target Date  07/15/17      PT LONG TERM GOAL #2   Title  Patient will report no pain with intercourse to demonstrate improved functional ability.    Time  12    Period  Weeks    Status  New    Target Date  07/15/17      PT LONG TERM GOAL #3   Title  "Patient will score at or below 64 on the PFDI and 8/43 on the Female NIH-CPSI to demonstrate a clinically meaningful decrease in disability and distress due to pelvic floor dysfunction.    Time  12    Period  Weeks    Status  New    Target Date  07/15/17            Plan - 06/17/17 1633    Clinical Impression Statement  Patient continues to improve, demonstrating inproved pain and leakage. She has been able to succesfully implement urge-supression technique at least half of the time with greatest difficulty remaining after long periods of sitting and ke-in-the-door syndrome. She responded well to all  intervention today. Continue per POC.    Clinical Presentation  Stable    Clinical Decision Making  Moderate    Rehab Potential  Good    Clinical Impairments Affecting Rehab Potential  depression, menopause    PT Frequency  1x / week    PT Duration  12 weeks    PT Treatment/Interventions  ADLs/Self Care Home Management;Biofeedback;Aquatic Therapy;Electrical Stimulation;Moist Heat;Traction;Functional mobility training;Therapeutic activities;Therapeutic exercise;Balance training;Neuromuscular re-education;Manual techniques;Patient/family education;Dry needling;Taping    PT Next Visit Plan  tall kneeling and multi-stepping, consider A&B exercise days TP release to R    PT Home Exercise Plan  diaphragmatic breathing, adductor stretch, child's pose, lengthening kegels (3 sec long-holds), sit-to-stand, squat form, log-roll, soda-can, squatty potty , TA in quadruped, pelvic tilts on ball, shoulder retractions, chin-tucks, rows, towel-roll extensions., bird-dog arms    Consulted and Agree with Plan of Care  Patient       Patient will benefit from skilled therapeutic intervention in order to improve the following deficits and impairments:  Increased fascial restricitons, Improper body mechanics, Pain, Decreased coordination, Decreased mobility, Decreased scar mobility, Increased muscle spasms, Postural dysfunction, Decreased activity tolerance, Decreased endurance, Decreased range of motion, Decreased strength, Decreased balance, Obesity  Visit Diagnosis: Other muscle spasm  Other lack of coordination  Muscle weakness (generalized)  Myalgia     Problem List Patient Active Problem List   Diagnosis Date Noted  . Monilial vaginitis 02/26/2017  . Vaginal dryness 02/26/2017  . Decreased libido 02/26/2017  . Anxiety with depression 02/26/2017  . Dyspareunia, female 02/26/2017  . S/P TAH (total abdominal hysterectomy) 07/02/2016  . 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 Morton's neuralgia  07/21/2015  . Nocturia 07/21/2015  . Stress incontinence 07/21/2015  . 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 Allergic rhinitis 06/21/2015  . Obesity (BMI 30-39.9) 06/21/2015   Cleophus Molt DPT, ATC Cleophus Molt 06/17/2017, 4:40 PM  [place] [facility] MAIN [facility] SERVICES [address] [place], [place], [address] Phone: [phone]   Fax:  [phone]  Name: Caitlin Barrett MRN: [id number] Date of Birth: 05/07/1970

## 2017-06-24 ENCOUNTER — Ambulatory Visit: Payer: PRIVATE HEALTH INSURANCE | Attending: Nurse Practitioner

## 2017-06-24 DIAGNOSIS — M6281 Muscle weakness (generalized): Secondary | ICD-10-CM | POA: Diagnosis present

## 2017-06-24 DIAGNOSIS — R278 Other lack of coordination: Secondary | ICD-10-CM | POA: Diagnosis present

## 2017-06-24 DIAGNOSIS — M791 Myalgia, unspecified site: Secondary | ICD-10-CM

## 2017-06-24 DIAGNOSIS — M62838 Other muscle spasm: Secondary | ICD-10-CM

## 2017-06-24 NOTE — Therapy (Signed)
[facility] MAIN [facility] SERVICES [address] [place], [place], [address] Phone: [phone]   Fax:  [phone]  Physical Therapy Treatment  Patient Details  Name: Caitlin Barrett MRN: [id number] Date of Birth: 28-Mar-1970 Referring Provider: Wilhelmina Mcardle   Encounter Date: 06/24/2017  PT End of Session - 06/24/17 1621    Visit Number  7    Number of Visits  12    Date for PT Re-Evaluation  07/15/17    PT Start Time  1512    PT Stop Time  1609    PT Time Calculation (min)  57 min    Activity Tolerance  Patient tolerated treatment well    Behavior During Therapy  [facility] for tasks assessed/performed       Past Medical History:  Diagnosis Date  . Allergy    seasonal.  Takes allergy shots at [facility]  . Anemia   . Anxiety   . Arthritis   . Asthma    exercise induced--no inhalers  . Cancer (HCC) 2010   Basal cell skin cancer Lt eye, skin graft above Lt eye  . Depression   . Dysmenorrhea   . Headache   . Hypothyroid    Nodules and Fine needle aspiration  . IBS (irritable bowel syndrome)   . Painful menstrual periods   . Plantar fasciitis   . Sprain of carpal (joint) of wrist     Past Surgical History:  Procedure Laterality Date  . ABDOMINAL HYSTERECTOMY Bilateral 07/02/2016   Procedure: HYSTERECTOMY ABDOMINAL WITH BILATERAL SALPINGECTOMY-MIDLINE INCISION;  Surgeon: Herold Harms, MD;  Location: [facility] ORS;  Service: Gynecology;  Laterality: Bilateral;  . CHOLECYSTECTOMY  2009  . HERNIA REPAIR  1974   umbilical  . SKIN GRAFT  2010  . TONSILLECTOMY      There were no vitals filed for this visit.   Pelvic Floor Physical Therapy Treatment Note  SCREENING  Changes in medications, allergies, or medical history?: no     SUBJECTIVE  Patient reports: Has been doing better, not feeling like she need to go a lot. Has had a couple of "close calls" but no leakage over the past week. Feels stiff between shoulder blades but  has not done thoracic extensions.  Pain update:  Location of pain: Low back and R lower abdomen. Current pain:  2/10  Max pain:  5/10 Least pain: 2/10 Nature of pain: achy * 0/10 following treatment today  Patient Goals: Gain control over her bladder. Return to exercise without leakage, painless intercourse.    OBJECTIVE  Changes in: Posture/Observations:  Continues to demo forward shoulders and head but is more aware and able to correct with ~75%  Efficacy.  Range of Motion/Flexibilty:  Stiff and tender to R sacral base>L, tender through thoracic spine as well.    INTERVENTIONS THIS SESSION: NM re-ed: Educated on and practiced Tall kneeling with PFM engagement To improve PFM recruitment in "good" posture. Educated on and practiced kneeling squats to improve ability to attain graded recruitment of glutes, PFM, and TA with functional motion. Educated on and practiced forward stepping with focus on exhale with TA recruitment and balance to improve anticipatory function on TA and PFM. Manual: grade 3-4 PA mobs to sacrum to improve mobility and decrease pain.  Total time: 57 min.                           PT Education - 06/24/17 1620  Education provided  Yes    Education Details  See Pt. Instructions and Interventions this session    Person(s) Educated  Patient    Methods  Explanation;Demonstration;Tactile cues;Verbal cues;Handout    Comprehension  Verbalized understanding;Returned demonstration;Verbal cues required;Tactile cues required       PT Short Term Goals - 06/17/17 1638      PT SHORT TERM GOAL #1   Title  Patient will demonstrate a coordinated contraction, relaxation, and bulge of the pelvic floor muscles to demonstrate functional recruitment and motion and allow for further strengthening.    Time  6    Period  Weeks    Status  Achieved    Target Date  06/03/17      PT SHORT TERM GOAL #2   Title  Patient will demonstrate appropriate  body mechanics with bending and lifting heavy objects to allow for decreased stress on the pelvic floor and low back.     Time  6    Period  Weeks    Status  Achieved    Target Date  06/03/17      PT SHORT TERM GOAL #3   Title  Patient will demonstrate HEP x1 in the clinic to demonstrate understanding and proper form to allow for further improvement.    Time  6    Period  Weeks    Status  Partially Met    Target Date  06/03/17        PT Long Term Goals - 04/22/17 1643      PT LONG TERM GOAL #1   Title  Patient will report no episodes of UI over the course of the prior two weeks to demonstrate improved functional ability.    Time  12    Period  Weeks    Status  New    Target Date  07/15/17      PT LONG TERM GOAL #2   Title  Patient will report no pain with intercourse to demonstrate improved functional ability.    Time  12    Period  Weeks    Status  New    Target Date  07/15/17      PT LONG TERM GOAL #3   Title  "Patient will score at or below 64 on the PFDI and 8/43 on the Female NIH-CPSI to demonstrate a clinically meaningful decrease in disability and distress due to pelvic floor dysfunction.    Time  12    Period  Weeks    Status  New    Target Date  07/15/17            Plan - 06/24/17 1622    Clinical Impression Statement  Patient responded well to all interventions this session, demonstrating proper performance and understanding of all education provided. She also demonstrated a reduction in LBP from 4/10 to 0/10 within the session following sacral mobilization. Continue per POC.    Clinical Presentation  Stable    Clinical Decision Making  Moderate    Rehab Potential  Good    Clinical Impairments Affecting Rehab Potential  depression, menopause    PT Frequency  1x / week    PT Duration  12 weeks    PT Treatment/Interventions  ADLs/Self Care Home Management;Biofeedback;Aquatic Therapy;Electrical Stimulation;Moist Heat;Traction;Functional mobility  training;Therapeutic activities;Therapeutic exercise;Balance training;Neuromuscular re-education;Manual techniques;Patient/family education;Dry needling;Taping    PT Next Visit Plan  TP release starting with R, thoracic mobility    PT Home Exercise Plan  diaphragmatic breathing, adductor stretch, child's pose,  lengthening kegels (3 sec long-holds), sit-to-stand, squat form, log-roll, soda-can, squatty potty , TA in quadruped, pelvic tilts on ball, shoulder retractions, chin-tucks, rows, towel-roll extensions., bird-dog arms    Consulted and Agree with Plan of Care  Patient       Patient will benefit from skilled therapeutic intervention in order to improve the following deficits and impairments:  Increased fascial restricitons, Improper body mechanics, Pain, Decreased coordination, Decreased mobility, Decreased scar mobility, Increased muscle spasms, Postural dysfunction, Decreased activity tolerance, Decreased endurance, Decreased range of motion, Decreased strength, Decreased balance, Obesity  Visit Diagnosis: Other muscle spasm  Other lack of coordination  Muscle weakness (generalized)  Myalgia     Problem List Patient Active Problem List   Diagnosis Date Noted  . Monilial vaginitis 02/26/2017  . Vaginal dryness 02/26/2017  . Decreased libido 02/26/2017  . Anxiety with depression 02/26/2017  . Dyspareunia, female 02/26/2017  . S/P TAH (total abdominal hysterectomy) 07/02/2016  . 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 Nocturia 07/21/2015  . Stress incontinence 07/21/2015  . 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 Allergic rhinitis 06/21/2015  . Obesity (BMI 30-39.9) 06/21/2015   Cleophus Molt DPT, ATC Cleophus Molt 06/24/2017, 4:25 PM  [facility] MAIN [facility] SERVICES [address] [place], [place], [address] Phone: [phone]   Fax:  [phone]  Name: MAHI VATER MRN: [id number] Date of Birth:  [birth year]

## 2017-06-24 NOTE — Patient Instructions (Addendum)
   Activate and draw up the pelvic floor and tuck pelvis slightly under by squeezing the lower tummy muscles and glutes. Hold up to 10 seconds, resting when the PF becomes tired. Repeat _10__ times, __1-2__ times per day.        Breathe in as you come down, gradually relaxing the glutes and TA. Exhale coming up to tall kneeling, gradually increasing the contraction in the glutes and low belly until they are "extra squeezed" at the top. Do 10x2 of these once a day.    Stand with equal weight on both feet engage the pelvic floor and lower abdomen and then lunge with the same leg 5 times in each direction, keeping foot forward and balancing for a second before placing the foot down between repetitions.  _3__ reps __1-2_ times per day.       The Original [employer] Lumbar Roll  As far as yoga classes, look into [employer] yoga, beginners yoga.

## 2017-07-01 ENCOUNTER — Ambulatory Visit: Payer: PRIVATE HEALTH INSURANCE

## 2017-07-11 ENCOUNTER — Ambulatory Visit: Payer: PRIVATE HEALTH INSURANCE

## 2017-07-11 DIAGNOSIS — M791 Myalgia, unspecified site: Secondary | ICD-10-CM

## 2017-07-11 DIAGNOSIS — M62838 Other muscle spasm: Secondary | ICD-10-CM

## 2017-07-11 DIAGNOSIS — M6281 Muscle weakness (generalized): Secondary | ICD-10-CM

## 2017-07-11 DIAGNOSIS — R278 Other lack of coordination: Secondary | ICD-10-CM

## 2017-07-11 NOTE — Therapy (Signed)
arms    Consulted and Agree with Plan of Care  Patient       Patient will benefit from skilled therapeutic intervention in order to improve the following deficits and impairments:  Increased fascial restricitons, Improper body mechanics, Pain, Decreased coordination, Decreased mobility, Decreased scar mobility, Increased muscle spasms, Postural dysfunction, Decreased activity tolerance, Decreased endurance, Decreased range of motion, Decreased strength, Decreased balance, Obesity  Visit Diagnosis: Other muscle spasm  Other lack of coordination  Muscle weakness (generalized)  Myalgia     Problem List Patient Active Problem List   Diagnosis Date Noted  . Monilial vaginitis 02/26/2017  . Vaginal dryness 02/26/2017  . Decreased libido 02/26/2017  . Anxiety with depression 02/26/2017  . Dyspareunia, female 02/26/2017  . S/P TAH (total abdominal hysterectomy) 07/02/2016  . 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 Nocturia 07/21/2015  . Stress incontinence 07/21/2015  . 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 Allergic rhinitis 06/21/2015  . Obesity (BMI 30-39.9) 06/21/2015   Marshell Dilauro T. Xzaviar Maloof DPT, ATC Anneli Bing T Cross Jorge 07/12/2017, 9:49 AM  [place] [facility] MA[facility] REHAB SERVICES [address] [place], [place], [address] Phone: [phone]   Fax:  [phone]  Name: Caitlin Barrett MRN: [id number] Date of Birth: 01/11/1971     arms    Consulted and Agree with Plan of Care  Patient       Patient will benefit from skilled therapeutic intervention in order to improve the following deficits and impairments:  Increased fascial restricitons, Improper body mechanics, Pain, Decreased coordination, Decreased mobility, Decreased scar mobility, Increased muscle spasms, Postural dysfunction, Decreased activity tolerance, Decreased endurance, Decreased range of motion, Decreased strength, Decreased balance, Obesity  Visit Diagnosis: Other muscle spasm  Other lack of coordination  Muscle weakness (generalized)  Myalgia     Problem List Patient Active Problem List   Diagnosis Date Noted  . Monilial vaginitis 02/26/2017  . Vaginal dryness 02/26/2017  . Decreased libido 02/26/2017  . Anxiety with depression 02/26/2017  . Dyspareunia, female 02/26/2017  . S/P TAH (total abdominal hysterectomy) 07/02/2016  . 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 Nocturia 07/21/2015  . Stress incontinence 07/21/2015  . 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 Allergic rhinitis 06/21/2015  . Obesity (BMI 30-39.9) 06/21/2015   Karry Causer T. Shanea Karney DPT, ATC Roizy Harold T Meosha Castanon 07/12/2017, 9:49 AM  [facility] MA[facility] REHAB SERVICES [address] [place], [place], [address] Phone: [phone]   Fax:  [phone]  Name: Caitlin Barrett MRN: [id number] Date of Birth: 01/11/1971     arms    Consulted and Agree with Plan of Care  Patient       Patient will benefit from skilled therapeutic intervention in order to improve the following deficits and impairments:  Increased fascial restricitons, Improper body mechanics, Pain, Decreased coordination, Decreased mobility, Decreased scar mobility, Increased muscle spasms, Postural dysfunction, Decreased activity tolerance, Decreased endurance, Decreased range of motion, Decreased strength, Decreased balance, Obesity  Visit Diagnosis: Other muscle spasm  Other lack of coordination  Muscle weakness (generalized)  Myalgia     Problem List Patient Active Problem List   Diagnosis Date Noted  . Monilial vaginitis 02/26/2017  . Vaginal dryness 02/26/2017  . Decreased libido 02/26/2017  . Anxiety with depression 02/26/2017  . Dyspareunia, female 02/26/2017  . S/P TAH (total abdominal hysterectomy) 07/02/2016  . 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 Nocturia 07/21/2015  . Stress incontinence 07/21/2015  . 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 Allergic rhinitis 06/21/2015  . Obesity (BMI 30-39.9) 06/21/2015   Willa Rough DPT, ATC Willa Rough 07/12/2017, 9:49 AM  [place] MAIN [facility] SERVICES [address] [place], [place], [address] Phone: [phone]   Fax:  [phone]  Name: Caitlin Barrett MRN: [id number] Date of Birth: 01/11/1971

## 2017-07-15 ENCOUNTER — Ambulatory Visit: Payer: PRIVATE HEALTH INSURANCE

## 2017-07-16 ENCOUNTER — Ambulatory Visit: Payer: PRIVATE HEALTH INSURANCE

## 2017-07-16 DIAGNOSIS — M6281 Muscle weakness (generalized): Secondary | ICD-10-CM

## 2017-07-16 DIAGNOSIS — R278 Other lack of coordination: Secondary | ICD-10-CM

## 2017-07-16 DIAGNOSIS — M62838 Other muscle spasm: Secondary | ICD-10-CM

## 2017-07-16 DIAGNOSIS — M791 Myalgia, unspecified site: Secondary | ICD-10-CM

## 2017-07-16 NOTE — Therapy (Signed)
[facility] MAIN [facility] SERVICES [address] [place], [place], [address] Phone: [phone]   Fax:  [phone]  Physical Therapy Treatment  Patient Details  Name: Caitlin Barrett MRN: [id number] Date of Birth: 18-Jan-1971 Referring Provider: Wilhelmina Mcardle   Encounter Date: 07/16/2017  PT End of Session - 07/16/17 1719    Visit Number  9    Number of Visits  12    Date for PT Re-Evaluation  07/15/17    PT Start Time  1600    PT Stop Time  1703    PT Time Calculation (min)  63 min    Activity Tolerance  Patient tolerated treatment well    Behavior During Therapy  [facility] for tasks assessed/performed       Past Medical History:  Diagnosis Date  . Allergy    seasonal.  Takes allergy shots at [facility]  . Anemia   . Anxiety   . Arthritis   . Asthma    exercise induced--no inhalers  . Cancer (HCC) 2010   Basal cell skin cancer Lt eye, skin graft above Lt eye  . Depression   . Dysmenorrhea   . Headache   . Hypothyroid    Nodules and Fine needle aspiration  . IBS (irritable bowel syndrome)   . Painful menstrual periods   . Plantar fasciitis   . Sprain of carpal (joint) of wrist     Past Surgical History:  Procedure Laterality Date  . ABDOMINAL HYSTERECTOMY Bilateral 07/02/2016   Procedure: HYSTERECTOMY ABDOMINAL WITH BILATERAL SALPINGECTOMY-MIDLINE INCISION;  Surgeon: Herold Harms, MD;  Location: [facility] ORS;  Service: Gynecology;  Laterality: Bilateral;  . CHOLECYSTECTOMY  2009  . HERNIA REPAIR  1974   umbilical  . SKIN GRAFT  2010  . TONSILLECTOMY      There were no vitals filed for this visit.    Pelvic Floor Physical Therapy Treatment Note  SCREENING  Changes in medications, allergies, or medical history?: no     SUBJECTIVE  Patient reports: Has still been dealing with her L ankle swelling and pain from injuring it in September. Had Pain with intercourse on Saturday. Otherwise doing well, still has trouble  exhaling all the way out.    Patient Goals: Gain control over her bladder. Return to exercise without leakage, painless intercourse.   OBJECTIVE  Changes in: Posture/Observations:  Anterior rolled shoulders and forward head, though much more aware and actively standing and sitting taller. Improved retraction/depression ROM following treatment.  INTERVENTIONS THIS SESSION: Self-care: educated on basics for ankle rehab due to Pt. Having chronic ankle pain that is affecting her gait and therefore her pelvis. Educated on lubricants for Pt. To try to prevent pain from dryness with initial penetration and educated on how important foreplay is and that there still may be less lubrication naturally following hysterectomy.  Manual: performed TP release to B pectoralis muscles and overpressure into retraction/depression to improve posture and increase available ROM to strengthen into. Educated on how to perform self external TP release to decrease spasms and allow for improved natural lubrication for intercourse without pain. Also educated on how to to perform self TP release and massage to posterior fourchette to prepare for intercourse and to down-regulate tension from fear response tension.  Total time: 63 min.                          PT Education - 07/16/17 1718    Education  provided  Yes    Education Details  See Pt. Instructions and Interventions this session.    Person(s) Educated  Patient    Methods  Explanation;Demonstration;Verbal cues;Handout    Comprehension  Verbalized understanding       PT Short Term Goals - 06/17/17 1638      PT SHORT TERM GOAL #1   Title  Patient will demonstrate a coordinated contraction, relaxation, and bulge of the pelvic floor muscles to demonstrate functional recruitment and motion and allow for further strengthening.    Time  6    Period  Weeks    Status  Achieved    Target Date  06/03/17      PT SHORT TERM GOAL #2   Title   Patient will demonstrate appropriate body mechanics with bending and lifting heavy objects to allow for decreased stress on the pelvic floor and low back.     Time  6    Period  Weeks    Status  Achieved    Target Date  06/03/17      PT SHORT TERM GOAL #3   Title  Patient will demonstrate HEP x1 in the clinic to demonstrate understanding and proper form to allow for further improvement.    Time  6    Period  Weeks    Status  Partially Met    Target Date  06/03/17        PT Long Term Goals - 04/22/17 1643      PT LONG TERM GOAL #1   Title  Patient will report no episodes of UI over the course of the prior two weeks to demonstrate improved functional ability.    Time  12    Period  Weeks    Status  New    Target Date  07/15/17      PT LONG TERM GOAL #2   Title  Patient will report no pain with intercourse to demonstrate improved functional ability.    Time  12    Period  Weeks    Status  New    Target Date  07/15/17      PT LONG TERM GOAL #3   Title  "Patient will score at or below 64 on the PFDI and 8/43 on the Female NIH-CPSI to demonstrate a clinically meaningful decrease in disability and distress due to pelvic floor dysfunction.    Time  12    Period  Weeks    Status  New    Target Date  07/15/17            Plan - 07/16/17 1720    Clinical Impression Statement  Pt. responded well to all Interventions today, demonstrating understanding of all education provided and Improved ROM and posture following manual treatment. Pt. continues to have pain with intercourse regardless of decreased PFM spasm over past week, educated on lubrication and more superfcial musculature but will re-assess at next visit. Pt. will require more skilled PT to continue addressing her symptoms though she has made great progress.     Clinical Presentation  Stable    Clinical Decision Making  Moderate    Rehab Potential  Good    Clinical Impairments Affecting Rehab Potential  depression,  menopause    PT Frequency  1x / week    PT Duration  12 weeks    PT Treatment/Interventions  ADLs/Self Care Home Management;Biofeedback;Aquatic Therapy;Electrical Stimulation;Moist Heat;Traction;Functional mobility training;Therapeutic activities;Therapeutic exercise;Balance training;Neuromuscular re-education;Manual techniques;Patient/family education;Dry needling;Taping    PT Next Visit  Plan  Re-check PFM and re-assess goals, send for re-cert    PT Home Exercise Plan  diaphragmatic breathing, adductor stretch, child's pose, lengthening kegels (3 sec long-holds), sit-to-stand, squat form, log-roll, soda-can, squatty potty , TA in quadruped, pelvic tilts on ball, shoulder retractions, chin-tucks, rows, towel-roll extensions., bird-dog arms    Consulted and Agree with Plan of Care  Patient       Patient will benefit from skilled therapeutic intervention in order to improve the following deficits and impairments:  Increased fascial restricitons, Improper body mechanics, Pain, Decreased coordination, Decreased mobility, Decreased scar mobility, Increased muscle spasms, Postural dysfunction, Decreased activity tolerance, Decreased endurance, Decreased range of motion, Decreased strength, Decreased balance, Obesity  Visit Diagnosis: Other muscle spasm  Other lack of coordination  Muscle weakness (generalized)  Myalgia     Problem List Patient Active Problem List   Diagnosis Date Noted  . Monilial vaginitis 02/26/2017  . Vaginal dryness 02/26/2017  . Decreased libido 02/26/2017  . Anxiety with depression 02/26/2017  . Dyspareunia, female 02/26/2017  . S/P TAH (total abdominal hysterectomy) 07/02/2016  . 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 Nocturia 07/21/2015  . Stress incontinence 07/21/2015  . 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 Allergic rhinitis 06/21/2015  . Obesity (BMI 30-39.9) 06/21/2015   Cleophus Molt DPT, ATC Cleophus Molt 07/16/2017, 5:25 PM  [place] [facility] MAIN [facility] SERVICES [address] [place], [place], [address] Phone: [phone]   Fax:  [phone]  Name: Caitlin Barrett MRN: [id number] Date of Birth: 03-09-1971

## 2017-07-16 NOTE — Patient Instructions (Signed)
  External TP release for superficial muscles.  Find the three muscles that make a triangle using 1-2 fingers and pressing firmly enough to feel the discomfort, breathe deeply until the pain reduces by at least 50-75% then move on to another muscle.   Thumb work Use thumb to press just inside the opening of vagina toward the anus and sweep side-to-side and forward and back to help release the muscles and prepare you for intercourse.

## 2017-07-23 ENCOUNTER — Ambulatory Visit: Payer: PRIVATE HEALTH INSURANCE

## 2017-07-23 DIAGNOSIS — M62838 Other muscle spasm: Secondary | ICD-10-CM | POA: Diagnosis not present

## 2017-07-23 DIAGNOSIS — M6281 Muscle weakness (generalized): Secondary | ICD-10-CM

## 2017-07-23 DIAGNOSIS — M791 Myalgia, unspecified site: Secondary | ICD-10-CM

## 2017-07-23 DIAGNOSIS — R278 Other lack of coordination: Secondary | ICD-10-CM

## 2017-07-23 NOTE — Patient Instructions (Addendum)
     Rotate your body away from your shoulder and look away to get a stretch through your neck and chest. Hold for 30 seconds (5 breaths) and repeat 2-3 times on each side  Mini-Marches    Exhale, drawing the lower tummy (TA) in toward the back bone and hold contraction while you lift one foot ~ 2 inches off the mat, then the other foot before relaxing and resetting. Try to keep your hips from rocking, using your hands to sense whether they are staying even as pictured.      Perform _10x2__ repetitions. Do this _1-2_ times per day.

## 2017-07-23 NOTE — Therapy (Addendum)
[place] MAIN [facility] SERVICES [address] [place], [place], [address] Phone: [phone]   Fax:  [phone]  Physical Therapy Treatment and Re-eval  Patient Details  Name: Caitlin Barrett MRN: [id number] Date of Birth: 1971/03/02 Referring Provider: Cassell Smiles   Encounter Date: 07/23/2017  PT End of Session - 07/23/17 1804    Visit Number  10    Number of Visits  12    Date for PT Re-Evaluation  07/15/17    PT Start Time  1600    PT Stop Time  1705    PT Time Calculation (min)  65 min    Activity Tolerance  Patient tolerated treatment well    Behavior During Therapy  [facility] for tasks assessed/performed       Past Medical History:  Diagnosis Date  . Allergy    seasonal.  Takes allergy shots at [facility]  . Anemia   . Anxiety   . Arthritis   . Asthma    exercise induced--no inhalers  . Cancer ([place]) 2010   Basal cell skin cancer Lt eye, skin graft above Lt eye  . Depression   . Dysmenorrhea   . Headache   . Hypothyroid    Nodules and Fine needle aspiration  . IBS (irritable bowel syndrome)   . Painful menstrual periods   . Plantar fasciitis   . Sprain of carpal (joint) of wrist     Past Surgical History:  Procedure Laterality Date  . ABDOMINAL HYSTERECTOMY Bilateral 07/02/2016   Procedure: HYSTERECTOMY ABDOMINAL WITH BILATERAL SALPINGECTOMY-MIDLINE INCISION;  Surgeon: Brayton Mars, MD;  Location: [facility] ORS;  Service: Gynecology;  Laterality: Bilateral;  . CHOLECYSTECTOMY  2009  . HERNIA REPAIR  9741   umbilical  . SKIN GRAFT  2010  . TONSILLECTOMY      There were no vitals filed for this visit.    Pelvic Floor Physical Therapy Treatment Note and Re-Eval  SCREENING  Changes in medications, allergies, or medical history?: no     SUBJECTIVE  Patient reports: Is doing good, is not in any pain today!. Had a little pain with initial penetration but this improved after with intercourse. Pain was 2-3/10  with intercourse. Is still feeling constipated sometimes, can have to sit up to 10 minutes to have a BM but is going daily. Is only drinking 3 glasses of water and then other liquids are caffinated. Was having to go every 30-45 minutes but is now able to go 2-3 hours in-between.     Pain update: Has had some LBP that seems to be from the way she sleeps    Location of pain: low back Current pain: 0/10  Max pain: 6/10 Least pain: 0/10 Nature of pain:achy   Patient Goals: No Pain with Intercourse, maintaining improvement in urinary symptoms while increasing water intake to decrease constipation. No return of pelvic pain.     OBJECTIVE  Changes in: Posture/Observations:  Forward rounded shoulders, internally rotated arms when walking.  Range of Motion/Flexibilty:  Spine: Pt. Demonstrates improved lumbar extension and less R rotation with extension but c/o pain in R SIJ with extension.  Strength/MMT:   LE MMT   LE MMT Left Right  Hip flex: (L2) /5 /5  Hip ext: 5/5 5/5  Hip abd: 5/5 5/5  Hip add: 5/5 5/5  Hip IR 4+/5 4+/5  Hip ER 5/5 5/5    INTERVENTIONS THIS SESSION: Therex: educated on and practiced mini-marches to improve strength of deep-core stabilizers  that they will not return. Pt will benefit from further strengthening and manual therapy to improve muscular balance and posture to prevent return of urinary symptoms and to continue to improve pain and constipation. She will continue to benefit from pelvic  floor physical therapy for 8 more weeks at once a week to work toward the remaining deficits and strengthen to decrease risk of symptom return.     History and Personal Factors relevant to plan of care:  Hypothroidism, depression, early menopause    Clinical Presentation  Stable    Clinical Decision Making  Moderate    Rehab Potential  Good    Clinical Impairments Affecting Rehab Potential  depression, menopause    PT Frequency  1x / week    PT Duration  8 weeks    PT Treatment/Interventions  ADLs/Self Care Home Management;Biofeedback;Aquatic Therapy;Electrical Stimulation;Moist Heat;Traction;Functional mobility training;Therapeutic activities;Therapeutic exercise;Balance training;Neuromuscular re-education;Manual techniques;Patient/family education;Dry needling;Taping    PT Next Visit Plan  Re-check PFM, TP release to anterior chest/shoulders, add rows with band and external rotation with band.    PT Home Exercise Plan  diaphragmatic breathing, adductor stretch, child's pose, lengthening kegels (3 sec long-holds), sit-to-stand, squat form, log-roll, soda-can, squatty potty , TA in quadruped, pelvic tilts on ball, shoulder retractions, chin-tucks, rows, towel-roll extensions., bird-dog arms    Consulted and Agree with Plan of Care  Patient       Patient will benefit from skilled therapeutic intervention in order to improve the following deficits and impairments:  Increased fascial restricitons, Improper body mechanics, Pain, Decreased coordination, Decreased mobility, Decreased scar mobility, Increased muscle spasms, Postural  dysfunction, Decreased activity tolerance, Decreased endurance, Decreased range of motion, Decreased strength, Decreased balance, Obesity  Visit Diagnosis: Other muscle spasm - Plan: PT plan of care cert/re-cert  Other lack of coordination - Plan: PT plan of care cert/re-cert  Muscle weakness (generalized) - Plan: PT plan of care cert/re-cert  Myalgia - Plan: PT plan of care cert/re-cert     Problem List Patient Active Problem List   Diagnosis Date Noted  . Monilial vaginitis 02/26/2017  . Vaginal dryness 02/26/2017  . Decreased libido 02/26/2017  . Anxiety with depression 02/26/2017  . Dyspareunia, female 02/26/2017  . S/P TAH (total abdominal hysterectomy) 07/02/2016  . 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 Nocturia 07/21/2015  . Stress incontinence 07/21/2015  . 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 Allergic rhinitis 06/21/2015  . Obesity (BMI 30-39.9) 06/21/2015   Willa Rough DPT, ATC Willa Rough 07/23/2017, 6:29 PM  [place] MAIN [facility] SERVICES [address] [place], [place], [address] Phone: [phone]   Fax:  [phone]  Name: Caitlin Barrett MRN: [id number] Date of Birth: Feb 18, 1971  that they will not return. Pt will benefit from further strengthening and manual therapy to improve muscular balance and posture to prevent return of urinary symptoms and to continue to improve pain and constipation. She will continue to benefit from pelvic  floor physical therapy for 8 more weeks at once a week to work toward the remaining deficits and strengthen to decrease risk of symptom return.     History and Personal Factors relevant to plan of care:  Hypothroidism, depression, early menopause    Clinical Presentation  Stable    Clinical Decision Making  Moderate    Rehab Potential  Good    Clinical Impairments Affecting Rehab Potential  depression, menopause    PT Frequency  1x / week    PT Duration  8 weeks    PT Treatment/Interventions  ADLs/Self Care Home Management;Biofeedback;Aquatic Therapy;Electrical Stimulation;Moist Heat;Traction;Functional mobility training;Therapeutic activities;Therapeutic exercise;Balance training;Neuromuscular re-education;Manual techniques;Patient/family education;Dry needling;Taping    PT Next Visit Plan  Re-check PFM, TP release to anterior chest/shoulders, add rows with band and external rotation with band.    PT Home Exercise Plan  diaphragmatic breathing, adductor stretch, child's pose, lengthening kegels (3 sec long-holds), sit-to-stand, squat form, log-roll, soda-can, squatty potty , TA in quadruped, pelvic tilts on ball, shoulder retractions, chin-tucks, rows, towel-roll extensions., bird-dog arms    Consulted and Agree with Plan of Care  Patient       Patient will benefit from skilled therapeutic intervention in order to improve the following deficits and impairments:  Increased fascial restricitons, Improper body mechanics, Pain, Decreased coordination, Decreased mobility, Decreased scar mobility, Increased muscle spasms, Postural  dysfunction, Decreased activity tolerance, Decreased endurance, Decreased range of motion, Decreased strength, Decreased balance, Obesity  Visit Diagnosis: Other muscle spasm - Plan: PT plan of care cert/re-cert  Other lack of coordination - Plan: PT plan of care cert/re-cert  Muscle weakness (generalized) - Plan: PT plan of care cert/re-cert  Myalgia - Plan: PT plan of care cert/re-cert     Problem List Patient Active Problem List   Diagnosis Date Noted  . Monilial vaginitis 02/26/2017  . Vaginal dryness 02/26/2017  . Decreased libido 02/26/2017  . Anxiety with depression 02/26/2017  . Dyspareunia, female 02/26/2017  . S/P TAH (total abdominal hysterectomy) 07/02/2016  . 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 Nocturia 07/21/2015  . Stress incontinence 07/21/2015  . 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 Allergic rhinitis 06/21/2015  . Obesity (BMI 30-39.9) 06/21/2015   Willa Rough DPT, ATC Willa Rough 07/23/2017, 6:29 PM  [place] MAIN [facility] SERVICES [address] [place], [place], [address] Phone: [phone]   Fax:  [phone]  Name: Caitlin Barrett MRN: [id number] Date of Birth: Feb 18, 1971

## 2017-07-24 ENCOUNTER — Other Ambulatory Visit: Payer: Self-pay | Admitting: "Endocrinology

## 2017-07-24 DIAGNOSIS — E236 Other disorders of pituitary gland: Secondary | ICD-10-CM

## 2017-07-24 DIAGNOSIS — E039 Hypothyroidism, unspecified: Secondary | ICD-10-CM

## 2017-08-13 ENCOUNTER — Ambulatory Visit: Payer: PRIVATE HEALTH INSURANCE

## 2017-08-20 ENCOUNTER — Ambulatory Visit: Payer: PRIVATE HEALTH INSURANCE | Attending: Nurse Practitioner

## 2017-08-20 DIAGNOSIS — M791 Myalgia, unspecified site: Secondary | ICD-10-CM | POA: Diagnosis present

## 2017-08-20 DIAGNOSIS — M6281 Muscle weakness (generalized): Secondary | ICD-10-CM

## 2017-08-20 DIAGNOSIS — M62838 Other muscle spasm: Secondary | ICD-10-CM | POA: Insufficient documentation

## 2017-08-20 DIAGNOSIS — R278 Other lack of coordination: Secondary | ICD-10-CM | POA: Insufficient documentation

## 2017-08-20 NOTE — Patient Instructions (Signed)
Self External Trigger Point Relief    1) Wash your hands and prop yourself up in a way where you can easily reach the vagina. You may wish to have a small hand-held mirror near by.  2) Use the 2 middle fingers to put gentle pressure on the three external pelvic floor muscles and hold pressure and take deep breaths as you allow the tension to release and discomfort to dissipate   3) Repeat the process for any trigger points you find spending between 3-10 minutes on this every 1-2 days until you do not find any more trigger points or you are told otherwise by your therapist.  Self Posterior Fourchette Stretching/Mobilization    1) Wash your hands and prop your body up so you can easily reach the vagina, bring hand-held mirror if desired.  2) Apply lubricant to the thumb and vaginal opening  3) Place thumb ~ 1/2 an inch into the vagina with the pad of the thumb pointed down and apply gentle pressure to the posterior fourchette.  4) Gently sweep the thumb side to side and in/out while maintaining pressure down toward the anus. Make sure the pressure is not so great that your muscles tighten up and guard, just enough to create slight discomfort.  Do this for ~ 3 min. Per night to decrease tightness and tenderness at the vaginal opening.   Female version: Bgee Classic (not plus!)    Female version: Dr. Kathline Magic Premium Prostate Massager     * With standing posture, make sure to stay half-way back with the butt so you do not over-correct and create more tightness in th bottom.

## 2017-08-20 NOTE — Therapy (Signed)
[facility] MAIN [facility] SERVICES [address] [place], [place], [address] Phone: [phone]   Fax:  [phone]  Physical Therapy Treatment  Patient Details  Name: Caitlin Barrett MRN: [id number] Date of Birth: [birth year] Referring Provider: Wilhelmina Mcardle   Encounter Date: 08/20/2017  PT End of Session - 08/21/17 1052    Visit Number  11    Number of Visits  12    Date for PT Re-Evaluation  09/17/17    PT Start Time  1604    PT Stop Time  1709    PT Time Calculation (min)  65 min    Activity Tolerance  Patient tolerated treatment well Pt. became teary when discussing weight loss    Behavior During Therapy  [facility] for tasks assessed/performed       Past Medical History:  Diagnosis Date  . Allergy    seasonal.  Takes allergy shots at [facility]  . Anemia   . Anxiety   . Arthritis   . Asthma    exercise induced--no inhalers  . Cancer (HCC) 2010   Basal cell skin cancer Lt eye, skin graft above Lt eye  . Depression   . Dysmenorrhea   . Headache   . Hypothyroid    Nodules and Fine needle aspiration  . IBS (irritable bowel syndrome)   . Painful menstrual periods   . Plantar fasciitis   . Sprain of carpal (joint) of wrist     Past Surgical History:  Procedure Laterality Date  . ABDOMINAL HYSTERECTOMY Bilateral 07/02/2016   Procedure: HYSTERECTOMY ABDOMINAL WITH BILATERAL SALPINGECTOMY-MIDLINE INCISION;  Surgeon: Herold Harms, MD;  Location: [facility] ORS;  Service: Gynecology;  Laterality: Bilateral;  . CHOLECYSTECTOMY  2009  . HERNIA REPAIR  1974   umbilical  . SKIN GRAFT  2010  . TONSILLECTOMY      There were no vitals filed for this visit.    Pelvic Floor Physical Therapy Treatment Note  SCREENING  Changes in medications, allergies, or medical history?: started using a nasal spray OTC and saline rinse for allergies.   SUBJECTIVE  Patient reports: Had some vaginal spasms the other day after climbing a flight of  stairs a few times and has been working long days. Back has not been hurting "like it had been" it is a little achy today from sleeping on a different bed and working long hours recently. Is taking 2 probiotics but is sweating a lot and having very large poops.   Pain update:  Location of pain: headache and low back Current pain:  1/10  Max pain:  3/10 Least pain:  0/10 Nature of pain: achy  Patient Goals: No Pain with Intercourse, maintaining improvement in urinary symptoms while increasing water intake to decrease constipation. No return of pelvic pain.     OBJECTIVE  Changes in:  Pelvic floor: Pt. Demonstrates greater PFM spasm and tenderness than was preset at last internal check.   INTERVENTIONS THIS SESSION: Manual: re-assessed PFM and performed TP release to PR, PC, and R IC and OI posteriorly to decrease pelvic floor tightness and pain with BM and intercourse. Self-care: educated Pt. On importance of increased water intake with  Higher temperatures and greater activity to decrease constipation. Discussed the potential for bowel distension issues if constipation not well managed and educated on the impact that decreasing BMI can have on PFM function. Reviewed external and Posterior fourchette TP release and appropriate frequency to decrease spasm and maintain improvement. Educated on tool  for internal TP release and general precaustions of technique.   Total time: 65 min.                         PT Education - 08/21/17 1051    Education provided  Yes    Education Details  See Pt. Instructions and Interventions this session.    Person(s) Educated  Patient    Methods  Explanation;Demonstration;Verbal cues;Handout    Comprehension  Verbalized understanding;Verbal cues required       PT Short Term Goals - 07/23/17 1815      PT SHORT TERM GOAL #1   Title  Patient will demonstrate a coordinated contraction, relaxation, and bulge of the pelvic floor  muscles to demonstrate functional recruitment and motion and allow for further strengthening.    Time  6    Period  Weeks    Status  Achieved    Target Date  06/03/17      PT SHORT TERM GOAL #2   Title  Patient will demonstrate appropriate body mechanics with bending and lifting heavy objects to allow for decreased stress on the pelvic floor and low back.     Time  6    Period  Weeks    Status  Achieved    Target Date  06/03/17      PT SHORT TERM GOAL #3   Title  Patient will demonstrate HEP x1 in the clinic to demonstrate understanding and proper form to allow for further improvement.    Time  6    Period  Weeks    Status  Achieved    Target Date  06/03/17        PT Long Term Goals - 07/23/17 1621      PT LONG TERM GOAL #1   Title  Patient will report no episodes of UI over the course of the prior two weeks to demonstrate improved functional ability.    Time  12    Period  Weeks    Status  Achieved    Target Date  07/15/17      PT LONG TERM GOAL #2   Title  Patient will report no pain with intercourse to demonstrate improved functional ability.    Time  12    Period  Weeks    Status  On-going    Target Date  09/17/17      PT LONG TERM GOAL #3   Title  Patient will score at or below 64 on the PFDI and 8/43 on the Female NIH-CPSI to demonstrate a clinically meaningful decrease in disability and distress due to pelvic floor dysfunction.    Baseline  NIH-CPSI:13/43, PFDI: 44/300 (4/30)    Time  12    Period  Weeks    Status  On-going    Target Date  09/17/17            Plan - 08/21/17 1054    Clinical Impression Statement  Pt. responded well, but slowly to TP release internally and understood all education provided. She could benefit from medical assistance with weight loss to aid in her overall health and appears to be very frustrated with her inabilty to lose weight. Continue per POC.    Clinical Presentation  Stable    Clinical Decision Making  Moderate     Rehab Potential  Good    Clinical Impairments Affecting Rehab Potential  depression, menopause    PT Frequency  1x / week  PT Duration  8 weeks    PT Treatment/Interventions  ADLs/Self Care Home Management;Biofeedback;Aquatic Therapy;Electrical Stimulation;Moist Heat;Traction;Functional mobility training;Therapeutic activities;Therapeutic exercise;Balance training;Neuromuscular re-education;Manual techniques;Patient/family education;Dry needling;Taping    PT Next Visit Plan  Internal TP release, TP release to anterior chest/shoulders, add rows with band and external rotation with band.    PT Home Exercise Plan  diaphragmatic breathing, adductor stretch, child's pose, lengthening kegels (3 sec long-holds), sit-to-stand, squat form, log-roll, soda-can, squatty potty , TA in quadruped, pelvic tilts on ball, shoulder retractions, chin-tucks, rows, towel-roll extensions., bird-dog arms    Recommended Other Services  Nutrition, assisted weight-loss    Consulted and Agree with Plan of Care  Patient       Patient will benefit from skilled therapeutic intervention in order to improve the following deficits and impairments:  Increased fascial restricitons, Improper body mechanics, Pain, Decreased coordination, Decreased mobility, Decreased scar mobility, Increased muscle spasms, Postural dysfunction, Decreased activity tolerance, Decreased endurance, Decreased range of motion, Decreased strength, Decreased balance, Obesity  Visit Diagnosis: Other muscle spasm  Other lack of coordination  Muscle weakness (generalized)  Myalgia     Problem List Patient Active Problem List   Diagnosis Date Noted  . Monilial vaginitis 02/26/2017  . Vaginal dryness 02/26/2017  . Decreased libido 02/26/2017  . Anxiety with depression 02/26/2017  . Dyspareunia, female 02/26/2017  . S/P TAH (total abdominal hysterectomy) 07/02/2016  . 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  Nocturia 07/21/2015  . Stress incontinence 07/21/2015  . 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 Allergic rhinitis 06/21/2015  . Obesity (BMI 30-39.9) 06/21/2015   Cleophus Molt DPT, ATC Cleophus Molt 08/21/2017, 10:57 AM  [facility] MAIN [facility] SERVICES [address] [place], [place], [address] Phone: [phone]   Fax:  [phone]  Name: Caitlin Barrett MRN: [id number] Date of Birth: 11-Nov-1970

## 2017-08-27 ENCOUNTER — Ambulatory Visit: Payer: PRIVATE HEALTH INSURANCE | Attending: Nurse Practitioner

## 2017-08-27 DIAGNOSIS — M6281 Muscle weakness (generalized): Secondary | ICD-10-CM | POA: Insufficient documentation

## 2017-08-27 DIAGNOSIS — M791 Myalgia, unspecified site: Secondary | ICD-10-CM | POA: Diagnosis present

## 2017-08-27 DIAGNOSIS — M62838 Other muscle spasm: Secondary | ICD-10-CM | POA: Insufficient documentation

## 2017-08-27 DIAGNOSIS — R278 Other lack of coordination: Secondary | ICD-10-CM

## 2017-08-27 NOTE — Therapy (Signed)
[facility] MAIN [facility] SERVICES [address] [place], [place], [address] Phone: [phone]   Fax:  [phone]  Physical Therapy Treatment  Patient Details  Name: Caitlin Barrett MRN: [id number] Date of Birth: 1971/02/03 Referring Provider: Wilhelmina Mcardle   Encounter Date: 08/27/2017  PT End of Session - 08/27/17 1604    Visit Number  12    Number of Visits  18    Date for PT Re-Evaluation  09/17/17    PT Start Time  1602    PT Stop Time  1708    PT Time Calculation (min)  66 min    Activity Tolerance  Patient tolerated treatment well;No increased pain    Behavior During Therapy  WFL for tasks assessed/performed       Past Medical History:  Diagnosis Date  . Allergy    seasonal.  Takes allergy shots at [facility]  . Anemia   . Anxiety   . Arthritis   . Asthma    exercise induced--no inhalers  . Cancer (HCC) 2010   Basal cell skin cancer Lt eye, skin graft above Lt eye  . Depression   . Dysmenorrhea   . Headache   . Hypothyroid    Nodules and Fine needle aspiration  . IBS (irritable bowel syndrome)   . Painful menstrual periods   . Plantar fasciitis   . Sprain of carpal (joint) of wrist     Past Surgical History:  Procedure Laterality Date  . ABDOMINAL HYSTERECTOMY Bilateral 07/02/2016   Procedure: HYSTERECTOMY ABDOMINAL WITH BILATERAL SALPINGECTOMY-MIDLINE INCISION;  Surgeon: Herold Harms, MD;  Location: [facility] ORS;  Service: Gynecology;  Laterality: Bilateral;  . CHOLECYSTECTOMY  2009  . HERNIA REPAIR  1974   umbilical  . SKIN GRAFT  2010  . TONSILLECTOMY      There were no vitals filed for this visit.    Pelvic Floor Physical Therapy Treatment Note  SCREENING  Changes in medications, allergies, or medical history?:no   SUBJECTIVE  Patient reports: Still having issues with BM's and it can shoot pain to the low back, her "sciatica" is acting up on the L side, it is usually on the R if it acts up. Had  to carry dogs up and down stairs bending/lifting. Has a dull headache again today. Could be caffeine withdrawal.    Pain update:  Location of pain: L glute, headache Current pain:  2/10  Max pain:  7-8/10 Least pain:  /10 Nature of pain: Ache  Patient Goals: No Pain with Intercourse, maintaining improvement in urinary symptoms while increasing water intake to decrease constipation. No return of pelvic pain.    OBJECTIVE  Changes in: Posture/Observations:  Mild forward slump through thoracic spine.   Range of Motion/Flexibilty:  Pt. Has limited horizontal abduction ROM and ER ROM as well as poor control of ER motion with elbow extensors dominant.  Palpation: TTP through B Masseter, temporalis, SCM, Upper trap, and Pectoralis muscles. Masseter muscle B referred pain to headache in forehead.  Gait Analysis:  INTERVENTIONS THIS SESSION: Manual: Performed TP release to B Pectoralis major and minor, Masseter, and temporalis muscles  And educated PT. In self TP release to Masseter and temporalis to decrease forward head posture and improve posture for improved alignment and neural flow to proximal muscles including the PFM. Therex: Educated on and practiced seated rows with blue band and ER with blue band to improve strength of the scapular stability muscles to decrease kyphosis and improve posture.  Educated on and practiced "snow-angels" on foam roller to improve ER and Horizontal ABD for improved posture. Self-care: educated about benefits of Dry needling to speed rate of TP release and allow for faster progress to allow Pt. Time to decide if she is interested in this treatment option at her next visit.   Total time: 66 min.                         PT Education - 08/28/17 1033    Education provided  Yes    Education Details  See pt. Instructions and Interventions this session    Person(s) Educated  Patient    Methods  Explanation;Demonstration;Tactile  cues;Verbal cues;Handout    Comprehension  Verbalized understanding;Returned demonstration;Verbal cues required;Tactile cues required;Need further instruction       PT Short Term Goals - 07/23/17 1815      PT SHORT TERM GOAL #1   Title  Patient will demonstrate a coordinated contraction, relaxation, and bulge of the pelvic floor muscles to demonstrate functional recruitment and motion and allow for further strengthening.    Time  6    Period  Weeks    Status  Achieved    Target Date  06/03/17      PT SHORT TERM GOAL #2   Title  Patient will demonstrate appropriate body mechanics with bending and lifting heavy objects to allow for decreased stress on the pelvic floor and low back.     Time  6    Period  Weeks    Status  Achieved    Target Date  06/03/17      PT SHORT TERM GOAL #3   Title  Patient will demonstrate HEP x1 in the clinic to demonstrate understanding and proper form to allow for further improvement.    Time  6    Period  Weeks    Status  Achieved    Target Date  06/03/17        PT Long Term Goals - 07/23/17 1621      PT LONG TERM GOAL #1   Title  Patient will report no episodes of UI over the course of the prior two weeks to demonstrate improved functional ability.    Time  12    Period  Weeks    Status  Achieved    Target Date  07/15/17      PT LONG TERM GOAL #2   Title  Patient will report no pain with intercourse to demonstrate improved functional ability.    Time  12    Period  Weeks    Status  On-going    Target Date  09/17/17      PT LONG TERM GOAL #3   Title  Patient will score at or below 64 on the PFDI and 8/43 on the Female NIH-CPSI to demonstrate a clinically meaningful decrease in disability and distress due to pelvic floor dysfunction.    Baseline  NIH-CPSI:13/43, PFDI: 44/300 (4/30)    Time  12    Period  Weeks    Status  On-going    Target Date  09/17/17            Plan - 08/28/17 1038    Clinical Impression Statement  Pt.  Responded well to all interventions today, describing a decrease in headache and demonstrating understandng of all exercises and education. She will benefit from further practice with use of a mirror for shoulder ER to solidify correct  technique. Continue per POC.    Clinical Presentation  Stable    Clinical Decision Making  Moderate    Rehab Potential  Good    Clinical Impairments Affecting Rehab Potential  depression, menopause    PT Frequency  1x / week    PT Duration  8 weeks    PT Treatment/Interventions  ADLs/Self Care Home Management;Biofeedback;Aquatic Therapy;Electrical Stimulation;Moist Heat;Traction;Functional mobility training;Therapeutic activities;Therapeutic exercise;Balance training;Neuromuscular re-education;Manual techniques;Patient/family education;Dry needling;Taping    PT Next Visit Plan  Internal TP release, TP release to anterior chest/shoulders/DN?, add rows with band and external rotation with band.    PT Home Exercise Plan  diaphragmatic breathing, adductor stretch, child's pose, lengthening kegels (3 sec long-holds), sit-to-stand, squat form, log-roll, soda-can, squatty potty , TA in quadruped, pelvic tilts on ball, shoulder retractions, chin-tucks, rows, towel-roll extensions., bird-dog arms    Consulted and Agree with Plan of Care  Patient       Patient will benefit from skilled therapeutic intervention in order to improve the following deficits and impairments:  Increased fascial restricitons, Improper body mechanics, Pain, Decreased coordination, Decreased mobility, Decreased scar mobility, Increased muscle spasms, Postural dysfunction, Decreased activity tolerance, Decreased endurance, Decreased range of motion, Decreased strength, Decreased balance, Obesity  Visit Diagnosis: Other muscle spasm  Other lack of coordination  Muscle weakness (generalized)  Myalgia     Problem List Patient Active Problem List   Diagnosis Date Noted  . Monilial vaginitis  02/26/2017  . Vaginal dryness 02/26/2017  . Decreased libido 02/26/2017  . Anxiety with depression 02/26/2017  . Dyspareunia, female 02/26/2017  . S/P TAH (total abdominal hysterectomy) 07/02/2016  . 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 Nocturia 07/21/2015  . Stress incontinence 07/21/2015  . 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 Allergic rhinitis 06/21/2015  . Obesity (BMI 30-39.9) 06/21/2015   Cleophus Molt DPT, ATC Cleophus Molt 08/28/2017, 10:47 AM  [facility] MAIN [facility] SERVICES [address] [place], [place], [address] Phone: [phone]   Fax:  [phone]  Name: Caitlin Barrett MRN: [id number] Date of Birth: 26-Jan-1971

## 2017-08-27 NOTE — Patient Instructions (Signed)
     Sit at the end of the foam roller or towel roll and lie back with your spine along the length of it. Bring your arms out to the side, palms up, and take _15__ belly breaths.  Next, slide arms up overhead doing a "snow angel" motion as you breathe in and down toward your hips as you breathe out. Do this for __15_ belly breaths.      Do 3x10 rotations for each arm once every-other day.     Do 3x10 rows, focusing on leading with the shoulder blades and exhale, pulling the lower tummy muscles in as you pull the shoulders back.

## 2017-09-10 ENCOUNTER — Ambulatory Visit: Payer: PRIVATE HEALTH INSURANCE

## 2017-09-10 DIAGNOSIS — M6281 Muscle weakness (generalized): Secondary | ICD-10-CM

## 2017-09-10 DIAGNOSIS — M62838 Other muscle spasm: Secondary | ICD-10-CM | POA: Diagnosis not present

## 2017-09-10 DIAGNOSIS — M791 Myalgia, unspecified site: Secondary | ICD-10-CM

## 2017-09-10 DIAGNOSIS — R278 Other lack of coordination: Secondary | ICD-10-CM

## 2017-09-10 NOTE — Therapy (Signed)
[facility] MAIN [facility] SERVICES [address] [place], [place], [address] Phone: [phone]   Fax:  [phone]  Physical Therapy Treatment  Patient Details  Name: Caitlin Barrett MRN: [id number] Date of Birth: Mar 28, 1970 Referring Provider: Wilhelmina Mcardle   Encounter Date: 09/10/2017  PT End of Session - 09/10/17 1715    Visit Number  13    Number of Visits  18    Date for PT Re-Evaluation  09/17/17    PT Start Time  1612    PT Stop Time  1716    PT Time Calculation (min)  64 min       Past Medical History:  Diagnosis Date  . Allergy    seasonal.  Takes allergy shots at [facility]  . Anemia   . Anxiety   . Arthritis   . Asthma    exercise induced--no inhalers  . Cancer (HCC) 2010   Basal cell skin cancer Lt eye, skin graft above Lt eye  . Depression   . Dysmenorrhea   . Headache   . Hypothyroid    Nodules and Fine needle aspiration  . IBS (irritable bowel syndrome)   . Painful menstrual periods   . Plantar fasciitis   . Sprain of carpal (joint) of wrist     Past Surgical History:  Procedure Laterality Date  . ABDOMINAL HYSTERECTOMY Bilateral 07/02/2016   Procedure: HYSTERECTOMY ABDOMINAL WITH BILATERAL SALPINGECTOMY-MIDLINE INCISION;  Surgeon: Herold Harms, MD;  Location: [facility] ORS;  Service: Gynecology;  Laterality: Bilateral;  . CHOLECYSTECTOMY  2009  . HERNIA REPAIR  1974   umbilical  . SKIN GRAFT  2010  . TONSILLECTOMY      There were no vitals filed for this visit.    Pelvic Floor Physical Therapy Treatment Note  SCREENING  Changes in medications, allergies, or medical history?:    SUBJECTIVE  Patient reports: Is still having pain with attempts at intercourse. Pain is less and "throbs for ~ 1-2 hours intercourse". Constipation is about the same. feels "dry"   Pain update:  Pain with intercourse only.  Patient Goals: No Pain with Intercourse, maintaining improvement in urinary symptoms  while increasing water intake to decrease constipation. No return of pelvic pain.   OBJECTIVE  Changes in: Posture/Observations:    Range of Motion/Flexibilty:  Pt. Demonstrates decreased ER in supine due to adductor spasm prior to treatment and improved ROM and no pain following treatment.   Palpation: TTP through B adductors, radiating up into the groin with palpation.  INTERVENTIONS THIS SESSION: Self care: Educated Pt on lubrication options and oils to help improve the vaginal tissue health and decrease pain with intercourse. Educated pt. On how to perform self internal TP release but did not practice, will need to practice at next visit. Reinforced Pt. On performing adductor stretch to maintain length Manual: performed STM and TP release to B adductors to decrease spasm and pain and decrease tension acting on the pelvis to decrease PFM tension.  DN: performed DN with standard technique to B adductors with a .30x28mm needle  to decrease spasm and pain and decrease tension acting on the pelvis to decrease PFM tension.    Total time: 68 min.                   Trigger Point Dry Needling - 09/11/17 1356    Consent Given?  Yes    Education Handout Provided  Yes    Muscles Treated Upper Body  Adductor longus/brevius/magnus             PT Short Term Goals - 07/23/17 1815      PT SHORT TERM GOAL #1   Title  Patient will demonstrate a coordinated contraction, relaxation, and bulge of the pelvic floor muscles to demonstrate functional recruitment and motion and allow for further strengthening.    Time  6    Period  Weeks    Status  Achieved    Target Date  06/03/17      PT SHORT TERM GOAL #2   Title  Patient will demonstrate appropriate body mechanics with bending and lifting heavy objects to allow for decreased stress on the pelvic floor and low back.     Time  6    Period  Weeks    Status  Achieved    Target Date  06/03/17      PT SHORT TERM GOAL #3    Title  Patient will demonstrate HEP x1 in the clinic to demonstrate understanding and proper form to allow for further improvement.    Time  6    Period  Weeks    Status  Achieved    Target Date  06/03/17        PT Long Term Goals - 07/23/17 1621      PT LONG TERM GOAL #1   Title  Patient will report no episodes of UI over the course of the prior two weeks to demonstrate improved functional ability.    Time  12    Period  Weeks    Status  Achieved    Target Date  07/15/17      PT LONG TERM GOAL #2   Title  Patient will report no pain with intercourse to demonstrate improved functional ability.    Time  12    Period  Weeks    Status  On-going    Target Date  09/17/17      PT LONG TERM GOAL #3   Title  Patient will score at or below 64 on the PFDI and 8/43 on the Female NIH-CPSI to demonstrate a clinically meaningful decrease in disability and distress due to pelvic floor dysfunction.    Baseline  NIH-CPSI:13/43, PFDI: 44/300 (4/30)    Time  12    Period  Weeks    Status  On-going    Target Date  09/17/17       Plan - 09/11/17 1402    Clinical Impression Statement  Pt. Responded well to all interventions today, demonstrating decreased adductor spasm and improved ROM as well as understanding of all education provided. Continue per POC    Clinical Presentation  Stable    Clinical Decision Making  Moderate    Rehab Potential  Good    Clinical Impairments Affecting Rehab Potential  depression, menopause    PT Frequency  1x / week    PT Duration  8 weeks    PT Treatment/Interventions  ADLs/Self Care Home Management;Biofeedback;Aquatic Therapy;Electrical Stimulation;Moist Heat;Traction;Functional mobility training;Therapeutic activities;Therapeutic exercise;Balance training;Neuromuscular re-education;Manual techniques;Patient/family education;Dry needling;Taping    PT Next Visit Plan  Re-assess, Internal TP release, TP release to anterior chest/shoulders/DN?, add rows with band  and external rotation with band.    PT Home Exercise Plan  diaphragmatic breathing, adductor stretch, child's pose, lengthening kegels (3 sec long-holds), sit-to-stand, squat form, log-roll, soda-can, squatty potty , TA in quadruped, pelvic tilts on ball, shoulder retractions, chin-tucks, rows, towel-roll extensions., bird-dog arms    Consulted and Agree  with Plan of Care  Patient       Patient will benefit from skilled therapeutic intervention in order to improve the following deficits and impairments:  Increased fascial restricitons, Improper body mechanics, Pain, Decreased coordination, Decreased mobility, Decreased scar mobility, Increased muscle spasms, Postural dysfunction, Decreased activity tolerance, Decreased endurance, Decreased range of motion, Decreased strength, Decreased balance, Obesity  Visit Diagnosis: Other muscle spasm  Other lack of coordination  Muscle weakness (generalized)  Myalgia     Problem List Patient Active Problem List   Diagnosis Date Noted  . Monilial vaginitis 02/26/2017  . Vaginal dryness 02/26/2017  . Decreased libido 02/26/2017  . Anxiety with depression 02/26/2017  . Dyspareunia, female 02/26/2017  . S/P TAH (total abdominal hysterectomy) 07/02/2016  . 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 Nocturia 07/21/2015  . Stress incontinence 07/21/2015  . 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 Allergic rhinitis 06/21/2015  . Obesity (BMI 30-39.9) 06/21/2015   Cleophus Molt DPT, ATC Cleophus Molt 09/11/2017, 2:04 PM  [facility] MAIN [facility] SERVICES [address] [place], [place], [address] Phone: [phone]   Fax:  [phone]  Name: Caitlin Barrett MRN: [id number] Date of Birth: 01/03/1971

## 2017-09-10 NOTE — Patient Instructions (Addendum)
1) look into candy mold vitamin E and coconut oil suppositories. Try using Crisco on one partner, and lubricant on the other to decrease friction. Also ask your MD about premarin.    2) Self Internal Trigger Point Release    1) Wash your hands and prop yourself up in a way where you can easily reach the vagina. You may wish to have a small hand-held mirror near by.  2) lubricate the tool and vaginal opening using a hypoallergenic lubricant such as "slippery-stuff".   3) Slowly and gently insert the tool into the vagina using deep breaths to allow relaxation of the muscles around the tool.  4) Avoiding the "12 o-clock" region near the urethra, gently use the handle of the tool like a lever to press the angled tip of the tool onto the wall of the pelvic floor.   5) Move the tool to different areas of the pelvic floor and feel for areas that are tender called "trigger points". When you find one hold the tool still, applying just enough pressure to elicit mild discomfort and take deep belly breaths until the discomfort subsides or decreases by at least 50%.   6) Repeat the process for any trigger points you find spending between 3-10 minutes on this per night until you do not find any more trigger points or you are told otherwise by your therapist..  Make sure that you keep your inner thigh muscles stretched out!

## 2017-09-16 ENCOUNTER — Ambulatory Visit
Admission: RE | Admit: 2017-09-16 | Discharge: 2017-09-16 | Disposition: A | Discharge status: Home / Self Care | Payer: PRIVATE HEALTH INSURANCE | Source: Ambulatory Visit | Attending: "Endocrinology | Admitting: "Endocrinology

## 2017-09-16 DIAGNOSIS — E236 Other disorders of pituitary gland: Secondary | ICD-10-CM | POA: Diagnosis present

## 2017-09-16 DIAGNOSIS — G935 Compression of brain: Secondary | ICD-10-CM | POA: Insufficient documentation

## 2017-09-16 DIAGNOSIS — E039 Hypothyroidism, unspecified: Secondary | ICD-10-CM | POA: Diagnosis not present

## 2017-09-16 MED ORDER — GADOBENATE DIMEGLUMINE 529 MG/ML IV SOLN: 20.0000 mL | Freq: Once | INTRAVENOUS | Status: DC | PRN

## 2017-09-16 MED ORDER — GADOBENATE DIMEGLUMINE 529 MG/ML IV SOLN
10.0000 mL | Freq: Once | INTRAVENOUS | Status: AC | PRN
  Administered 2017-09-16: 10 mL via INTRAVENOUS

## 2017-09-23 ENCOUNTER — Ambulatory Visit: Payer: PRIVATE HEALTH INSURANCE | Attending: Nurse Practitioner

## 2017-09-23 DIAGNOSIS — M6281 Muscle weakness (generalized): Secondary | ICD-10-CM | POA: Insufficient documentation

## 2017-09-23 DIAGNOSIS — M62838 Other muscle spasm: Secondary | ICD-10-CM | POA: Insufficient documentation

## 2017-09-23 DIAGNOSIS — R278 Other lack of coordination: Secondary | ICD-10-CM | POA: Insufficient documentation

## 2017-09-23 DIAGNOSIS — M791 Myalgia, unspecified site: Secondary | ICD-10-CM | POA: Diagnosis present

## 2017-09-23 NOTE — Therapy (Signed)
[facility] MAIN [facility] SERVICES [address] [place], [place], [address] Phone: [phone]   Fax:  [phone]  Physical Therapy Treatment  Patient Details  Name: Caitlin Barrett MRN: [id number] Date of Birth: [birth year] Referring Provider: Wilhelmina Mcardle   Encounter Date: 09/23/2017  PT End of Session - 09/27/17 1206    Visit Number  14    Number of Visits  18    Date for PT Re-Evaluation  09/17/17    PT Start Time  1504    PT Stop Time  1609    PT Time Calculation (min)  65 min    Activity Tolerance  Patient tolerated treatment well;No increased pain    Behavior During Therapy  WFL for tasks assessed/performed       Past Medical History:  Diagnosis Date  . Allergy    seasonal.  Takes allergy shots at [facility]  . Anemia   . Anxiety   . Arthritis   . Asthma    exercise induced--no inhalers  . Cancer (HCC) 2010   Basal cell skin cancer Lt eye, skin graft above Lt eye  . Depression   . Dysmenorrhea   . Headache   . Hypothyroid    Nodules and Fine needle aspiration  . IBS (irritable bowel syndrome)   . Painful menstrual periods   . Plantar fasciitis   . Sprain of carpal (joint) of wrist     Past Surgical History:  Procedure Laterality Date  . ABDOMINAL HYSTERECTOMY Bilateral 07/02/2016   Procedure: HYSTERECTOMY ABDOMINAL WITH BILATERAL SALPINGECTOMY-MIDLINE INCISION;  Surgeon: Herold Harms, MD;  Location: [facility] ORS;  Service: Gynecology;  Laterality: Bilateral;  . CHOLECYSTECTOMY  2009  . HERNIA REPAIR  1974   umbilical  . SKIN GRAFT  2010  . TONSILLECTOMY      There were no vitals filed for this visit.    Pelvic Floor Physical Therapy Treatment Note  SCREENING  Changes in medications, allergies, or medical history?: no    SUBJECTIVE  Patient reports: Is sore everywhere from starting to exercise again. Was doing rowing and the elliptical. Has had softer and more regular BM's has been modifying her diet  some. When she skipped a day she had one firm stool that required a little straining. Was able to successfully implement urge suppression technique. Has reduced soda and sweet tea and is drinking more water. Had intercourse with some pinchy feeling, no achyness afterward. Was able to orgasm second day in a row.  Pain update: No pain  Patient Goals: No Pain with Intercourse, maintaining improvement in urinary symptoms while increasing water intake to decrease constipation. No return of pelvic pain.     OBJECTIVE  Changes in: Posture/Observations:  Patient demonstrates improved upper posture today with some anterior pelvic tilt remaining.   Pelvic floor: Pt. Demonstrated decreased overall PFM tension but continues to have spasm. Spasms responded well to TP release with slow resolution at the posterior fourchette.  INTERVENTIONS THIS SESSION: Manual: Performed TP release to B IC, PR, and PC posteriorly to decrease spasm and allow Pt. To have intercourse without pain.  Self-care: Educated Pt. On how to perform self internal TP release to manage spasms and pain. Educated on how she can help prevent return of spasm by improving pelvic  Position and avoiding high-impact exercise like cross-fit.   Total time: 65 min.  PT Education - 09/27/17 1205    Education provided  Yes    Education Details  See Interventions this session    Person(s) Educated  Patient    Methods  Explanation;Demonstration;Tactile cues;Verbal cues;Handout    Comprehension  Verbalized understanding;Returned demonstration;Verbal cues required;Tactile cues required;Need further instruction       PT Short Term Goals - 07/23/17 1815      PT SHORT TERM GOAL #1   Title  Patient will demonstrate a coordinated contraction, relaxation, and bulge of the pelvic floor muscles to demonstrate functional recruitment and motion and allow for further strengthening.    Time  6     Period  Weeks    Status  Achieved    Target Date  06/03/17      PT SHORT TERM GOAL #2   Title  Patient will demonstrate appropriate body mechanics with bending and lifting heavy objects to allow for decreased stress on the pelvic floor and low back.     Time  6    Period  Weeks    Status  Achieved    Target Date  06/03/17      PT SHORT TERM GOAL #3   Title  Patient will demonstrate HEP x1 in the clinic to demonstrate understanding and proper form to allow for further improvement.    Time  6    Period  Weeks    Status  Achieved    Target Date  06/03/17        PT Long Term Goals - 09/27/17 1204      PT LONG TERM GOAL #1   Title  Patient will report no episodes of UI over the course of the prior two weeks to demonstrate improved functional ability.    Time  12    Period  Weeks    Status  Achieved    Target Date  07/15/17      PT LONG TERM GOAL #2   Title  Patient will report no pain with intercourse to demonstrate improved functional ability.    Time  12    Period  Weeks    Status  On-going    Target Date  11/04/17      PT LONG TERM GOAL #3   Title  Patient will score at or below 64 on the PFDI and 8/43 on the Female NIH-CPSI to demonstrate a clinically meaningful decrease in disability and distress due to pelvic floor dysfunction.    Baseline  NIH-CPSI:13/43, PFDI: 44/300 (4/30)    Time  12    Period  Weeks    Status  On-going    Target Date  11/04/17            Plan - 09/27/17 1206    Clinical Impression Statement  Pt. responded well to all interventions today and has made progress toward or met all goals so far. She is no longer having urine leakage or constipation but her defecit remaining is some "pinchy" pain with intercourse that is not as bad as it was previously or lasting as long but not resolved. Due to scheduling we have not reached the planned number of visits and she will benefit from 6 more visits to make sure that her pain is fully resolved and to  teach her more deep core strengthening to prevent the return of pain.    History and Personal Factors relevant to plan of care:  Hypothyroidism, depression, early menopause, obesity.    Clinical Presentation  Stable  Clinical Decision Making  Moderate    Rehab Potential  Good    Clinical Impairments Affecting Rehab Potential  Hypothyroidism, depression, early menopause, obesity.    PT Frequency  1x / week    PT Duration  6 weeks    PT Treatment/Interventions  ADLs/Self Care Home Management;Biofeedback;Aquatic Therapy;Electrical Stimulation;Moist Heat;Traction;Functional mobility training;Therapeutic activities;Therapeutic exercise;Balance training;Neuromuscular re-education;Manual techniques;Patient/family education;Dry needling;Taping    PT Next Visit Plan  TP release to LB and anterior chest/shoulders/DN?, add rows with band and external rotation with band.    PT Home Exercise Plan  diaphragmatic breathing, adductor stretch, child's pose, lengthening kegels (3 sec long-holds), sit-to-stand, squat form, log-roll, soda-can, squatty potty , TA in quadruped, pelvic tilts on ball, shoulder retractions, chin-tucks, rows, towel-roll extensions., bird-dog arms    Recommended Other Services  Nutrition, assisted weight-loss    Consulted and Agree with Plan of Care  Patient       Patient will benefit from skilled therapeutic intervention in order to improve the following deficits and impairments:  Increased fascial restricitons, Improper body mechanics, Pain, Decreased coordination, Decreased mobility, Decreased scar mobility, Increased muscle spasms, Postural dysfunction, Decreased activity tolerance, Decreased endurance, Decreased range of motion, Decreased strength, Decreased balance, Obesity  Visit Diagnosis: Other muscle spasm  Other lack of coordination  Muscle weakness (generalized)  Myalgia     Problem List Patient Active Problem List   Diagnosis Date Noted  . Monilial vaginitis  02/26/2017  . Vaginal dryness 02/26/2017  . Decreased libido 02/26/2017  . Anxiety with depression 02/26/2017  . Dyspareunia, female 02/26/2017  . S/P TAH (total abdominal hysterectomy) 07/02/2016  . 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 Nocturia 07/21/2015  . Stress incontinence 07/21/2015  . 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 Allergic rhinitis 06/21/2015  . Obesity (BMI 30-39.9) 06/21/2015   Cleophus Molt DPT, ATC Cleophus Molt 09/27/2017, 12:08 PM  [facility] MAIN [facility] SERVICES [address] [place], [place], [address] Phone: [phone]   Fax:  [phone]  Name: Caitlin Barrett MRN: [id number] Date of Birth: 12-12-1970

## 2017-09-30 ENCOUNTER — Ambulatory Visit: Payer: PRIVATE HEALTH INSURANCE

## 2017-10-05 ENCOUNTER — Ambulatory Visit
Admission: EM | Admit: 2017-10-05 | Discharge: 2017-10-05 | Disposition: A | Discharge status: Home / Self Care | Payer: PRIVATE HEALTH INSURANCE | Attending: Family | Admitting: Family

## 2017-10-05 ENCOUNTER — Other Ambulatory Visit: Payer: Self-pay

## 2017-10-05 ENCOUNTER — Encounter: Payer: Self-pay | Admitting: Gynecology

## 2017-10-05 DIAGNOSIS — E669 Obesity, unspecified: Secondary | ICD-10-CM | POA: Diagnosis not present

## 2017-10-05 DIAGNOSIS — Z9889 Other specified postprocedural states: Secondary | ICD-10-CM | POA: Diagnosis not present

## 2017-10-05 DIAGNOSIS — J45909 Unspecified asthma, uncomplicated: Secondary | ICD-10-CM | POA: Insufficient documentation

## 2017-10-05 DIAGNOSIS — E039 Hypothyroidism, unspecified: Secondary | ICD-10-CM | POA: Diagnosis not present

## 2017-10-05 DIAGNOSIS — Z79890 Hormone replacement therapy (postmenopausal): Secondary | ICD-10-CM | POA: Insufficient documentation

## 2017-10-05 DIAGNOSIS — F418 Other specified anxiety disorders: Secondary | ICD-10-CM | POA: Diagnosis not present

## 2017-10-05 DIAGNOSIS — N946 Dysmenorrhea, unspecified: Secondary | ICD-10-CM | POA: Insufficient documentation

## 2017-10-05 DIAGNOSIS — R1013 Epigastric pain: Secondary | ICD-10-CM

## 2017-10-05 DIAGNOSIS — Z91041 Radiographic dye allergy status: Secondary | ICD-10-CM | POA: Insufficient documentation

## 2017-10-05 DIAGNOSIS — Z85828 Personal history of other malignant neoplasm of skin: Secondary | ICD-10-CM | POA: Diagnosis not present

## 2017-10-05 DIAGNOSIS — Z90710 Acquired absence of both cervix and uterus: Secondary | ICD-10-CM | POA: Diagnosis not present

## 2017-10-05 DIAGNOSIS — Z833 Family history of diabetes mellitus: Secondary | ICD-10-CM | POA: Diagnosis not present

## 2017-10-05 DIAGNOSIS — Z791 Long term (current) use of non-steroidal anti-inflammatories (NSAID): Secondary | ICD-10-CM | POA: Diagnosis not present

## 2017-10-05 DIAGNOSIS — R0789 Other chest pain: Secondary | ICD-10-CM | POA: Insufficient documentation

## 2017-10-05 DIAGNOSIS — Z885 Allergy status to narcotic agent status: Secondary | ICD-10-CM | POA: Insufficient documentation

## 2017-10-05 DIAGNOSIS — Z9049 Acquired absence of other specified parts of digestive tract: Secondary | ICD-10-CM | POA: Diagnosis not present

## 2017-10-05 DIAGNOSIS — Z79899 Other long term (current) drug therapy: Secondary | ICD-10-CM | POA: Diagnosis not present

## 2017-10-05 DIAGNOSIS — Z8249 Family history of ischemic heart disease and other diseases of the circulatory system: Secondary | ICD-10-CM | POA: Diagnosis not present

## 2017-10-05 DIAGNOSIS — Z888 Allergy status to other drugs, medicaments and biological substances status: Secondary | ICD-10-CM | POA: Diagnosis not present

## 2017-10-05 DIAGNOSIS — Z823 Family history of stroke: Secondary | ICD-10-CM | POA: Diagnosis not present

## 2017-10-05 DIAGNOSIS — K589 Irritable bowel syndrome without diarrhea: Secondary | ICD-10-CM | POA: Insufficient documentation

## 2017-10-05 DIAGNOSIS — K219 Gastro-esophageal reflux disease without esophagitis: Secondary | ICD-10-CM | POA: Insufficient documentation

## 2017-10-05 LAB — TROPONIN I

## 2017-10-05 MED ORDER — GI COCKTAIL ~~LOC~~
30.0000 mL | Freq: Once | ORAL | Status: AC
  Administered 2017-10-05: 30 mL via ORAL

## 2017-10-05 NOTE — Discharge Instructions (Signed)
As discussed at length, your EKG is reassuring, troponin is normal.  You do not have any strong cardiovascular risk factors for heart attack.  However as discussed, patients can present atypically for heart attack. I would like for you to stay extremely vigilant, any worsening symptoms, new symptoms or persistent chest pain, please go to emergency room as I want you to be safe. As we also discussed, it is reasonable to go to the pharmacy and pick up over-the-counter Prilosec.  This medication effectively turns off acid production in the stomach.  You may stay on this medication short-term, please discuss staying on it more permanently with your primary care provider. If there is no improvement in your symptoms, or if there is any worsening of symptoms, or if you have any additional concerns, please return for re-evaluation; or, if we are closed, consider going to the Emergency Room for evaluation if symptoms urgent.

## 2017-10-05 NOTE — ED Triage Notes (Signed)
Patient c/o burning sensation / pressure in mid chest. Per patient with heart burn x 1 week ago.

## 2017-10-05 NOTE — ED Provider Notes (Signed)
[facility]    CSN: [id number] Arrival date & time: 10/05/17  1412     History   Chief Complaint Chief Complaint  Patient presents with  . GI Problem    HPI Caitlin Barrett is a 47 y.o. female.   CC: epigastric, central 'burning' x 6 days, comes and goes. Yesterday became more constant. There now. Not associated with meals.  Feels pressure in center of chest- there with rest and activity. No arm pain. Tried advil and tums with no relief. No changes to diet. No abdominal pain, belching, burping, cough, hoarseness.    Uses 248m iburprofen daily. No etoh. NOn smoker.   No injury, lifting anything heavy.   Works as pAcademic librarian dDevelopment worker, community   Denies numbness or tingling radiating to left arm or jaw, palpitations, dizziness, frequent headaches, changes in vision, or shortness of breath.   No h/o HTN.   No h/o MI, CVA. Never seen cardiology       Past Medical History:  Diagnosis Date  . Allergy    seasonal.  [facility]  . Anemia   . Anxiety   . Arthritis   . Asthma    exercise induced--no inhalers  . Cancer ([facility] 2010   Basal cell skin cancer Lt eye, skin graft above Lt eye  . Depression   . Dysmenorrhea   . Headache   . Hypothyroid    Nodules and Fine needle aspiration  . IBS (irritable bowel syndrome)   . Painful menstrual periods   . Plantar fasciitis   . Sprain of carpal (joint) of wrist     Patient Active Problem List   Diagnosis Date Noted  . Monilial vaginitis 02/26/2017  . Vaginal dryness 02/26/2017  . Decreased libido 02/26/2017  . Anxiety with depression 02/26/2017  . Dyspareunia, female 02/26/2017  . S/P TAH (total abdominal hysterectomy) 07/02/2016  . 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 Nocturia 07/21/2015  . Stress incontinence 07/21/2015  . 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 Allergic rhinitis 06/21/2015  . Obesity (BMI 30-39.9) 06/21/2015    Past Surgical  History:  Procedure Laterality Date  . ABDOMINAL HYSTERECTOMY Bilateral 07/02/2016   Procedure: HYSTERECTOMY ABDOMINAL WITH BILATERAL SALPINGECTOMY-MIDLINE INCISION;  Surgeon: MBrayton Mars MD;  Location: [facility] ORS;  Service: Gynecology;  Laterality: Bilateral;  . CHOLECYSTECTOMY  2009  . HERNIA REPAIR  17858  umbilical  . SKIN GRAFT  2010  . TONSILLECTOMY      OB History    Gravida  0   Para  0   Term  0   Preterm  0   AB  0   Living  0     SAB  0   TAB  0   Ectopic  0   Multiple  0   Live Births  0            Home Medications    Prior to Admission medications   Medication Sig Start Date End Date Taking? Authorizing Provider  albuterol (PROVENTIL HFA;VENTOLIN HFA) 108 (90 Base) MCG/ACT inhaler Inhale 2 puffs into the lungs every 6 (six) hours as needed for wheezing or shortness of breath.   Yes [provider]  Cranberry-Vitamin C-Probiotic (AZO CRANBERRY PO) Take 2 tablets by mouth once.   Yes [provider]  EPINEPHrine 0.3 mg/0.3 mL IJ SOAJ injection 0.3 mg as needed.  09/25/08  Yes [provider]  estradiol (ESTRACE) 1 MG tablet  Take 1 tablet (1 mg total) by mouth daily. 03/05/17  Yes Defrancesco, Alanda Slim, MD  ibuprofen (ADVIL,MOTRIN) 400 MG tablet Take 400 mg by mouth every 6 (six) hours as needed.   Yes [provider]  ipratropium (ATROVENT) 0.06 % nasal spray Place 2 sprays into both nostrils 4 (four) times daily. For up to 5-7 days then stop. 06/20/16  Yes Karamalegos, Devonne Doughty, DO  Probiotic Product (TRUBIOTICS) CAPS Take 1 capsule by mouth daily.   Yes [provider]  buPROPion (WELLBUTRIN XL) 150 MG 24 hr tablet Take 1 tablet (150 mg total) by mouth daily. Patient not taking: Reported on 06/14/2017 02/26/17   Defrancesco, Alanda Slim, MD  Fluticasone Furoate (ARNUITY ELLIPTA) 100 MCG/ACT AEPB Inhale 1 Inhaler into the lungs daily. Patient not taking: Reported on 05/02/2017 04/01/17   Mikey College, NP  levothyroxine (SYNTHROID, LEVOTHROID) 50 MCG tablet Take 25 mcg by mouth daily before breakfast.  06/02/15   [provider]    Family History Family History  Problem Relation Age of Onset  . Hyperlipidemia Mother   . Hypertension Mother   . Cancer Father        lung CA  . Heart disease Maternal Grandmother   . Stroke Maternal Grandmother   . Hypertension Maternal Grandmother   . Diabetes Maternal Grandmother   . Heart disease Maternal Grandfather   . Diabetes Maternal Grandfather   . Heart attack Maternal Grandfather     Social History Social History   Tobacco Use  . Smoking status: Never Smoker  . Smokeless tobacco: Never Used  Substance Use Topics  . Alcohol use: Yes    Comment: rare  . Drug use: No     Allergies   Contrast media [iodinated diagnostic agents]; Codeine; and Other   Review of Systems Review of Systems   Physical Exam Triage Vital Signs ED Triage Vitals  Enc Vitals Group     BP 10/05/17 1423 (!) 157/89     Pulse Rate 10/05/17 1423 80     Resp 10/05/17 1423 18     Temp 10/05/17 1423 98.2 F (36.8 C)     Temp Source 10/05/17 1423 Oral     SpO2 10/05/17 1423 99 %     Weight 10/05/17 1425 278 lb (126.1 kg)     Height 10/05/17 1425 5' 8"  (1.727 m)     Head Circumference --      Peak Flow --      Pain Score 10/05/17 1425 5     Pain Loc --      Pain Edu? --      Excl. in [place]? --    No data found.  Updated Vital Signs BP (!) 141/75 (BP Location: Left Arm)   Pulse 80   Temp 98.2 F (36.8 C) (Oral)   Resp 18   Ht 5' 8"  (1.727 m)   Wt 278 lb (126.1 kg)   LMP 05/26/2016 (Exact Date)   SpO2 99%   BMI 42.27 kg/m   Visual Acuity Right Eye Distance:   Left Eye Distance:   Bilateral Distance:    Right Eye Near:   Left Eye Near:    Bilateral Near:     Physical Exam  Constitutional: She appears well-developed and well-nourished.  Eyes: Conjunctivae are normal.  Cardiovascular: Normal rate, regular rhythm, normal  heart sounds and normal pulses.  Pulmonary/Chest: Effort normal and breath sounds normal. She has no wheezes. She has no rhonchi. She has no rales.  She exhibits tenderness.  Tenderness with palpation of chest wall.     Neurological: She is alert.  Skin: Skin is warm and dry.  Psychiatric: She has a normal mood and affect. Her speech is normal and behavior is normal. Thought content normal.  Vitals reviewed.    UC Treatments / Results  Labs (all labs ordered are listed, but only abnormal results are displayed) Labs Reviewed  TROPONIN I    EKG None  Radiology No results found.  Procedures Procedures (including critical care time)  Medications Ordered in UC Medications  gi cocktail (Maalox,Lidocaine,Donnatal) (30 mLs Oral Given 10/05/17 1525)    Initial Impression / Assessment and Plan / UC Course  I have reviewed the triage vital signs and the nursing notes.  Pertinent labs & imaging results that were available during my care of the patient were reviewed by me and considered in my medical decision making (see chart for details).      Final Clinical Impressions(s) / UC Diagnoses   Final diagnoses:  Chest wall pain  Gastroesophageal reflux disease, esophagitis presence not specified  Etiology of epigastric pain is nonspecific at this time.  I am reassured by Negative troponin. Normal EKG, no acute ischemia.No prior EKG to compare too. Her repeat blood pressure improved with rest.  Patient does not have a strong cardiovascular risk factors. Discussed with patient at great length my thoughts regarding her risk for myocardial infarction, as well as working diagnosis of acid reflux today.  Patient not have any improvement from the GI cocktail which was given today; however find this to be nonspecific.  Advised patient for further work-up she would need to be seen in the emergency room for serial troponins, higher level of care.  She politely declined this today and she felt  comfortable with going and buying omeprazole over-the-counter to see if this helps with her symptoms.  This is reasonable being that her chest pain was reproducible on exam, it is not exertional, and lack of risk factors as discussed above.  Emphasized the importance of very close vigilance and if any new or worsening symptoms today, patient understands to go immediately to emergency room or call 911.      Discharge Instructions     As discussed at length, your EKG is reassuring, troponin is normal.  You do not have any strong cardiovascular risk factors for heart attack.  However as discussed, patients can present atypically for heart attack. I would like for you to stay extremely vigilant, any worsening symptoms, new symptoms or persistent chest pain, please go to emergency room as I want you to be safe. As we also discussed, it is reasonable to go to the pharmacy and pick up over-the-counter Prilosec.  This medication effectively turns off acid production in the stomach.  You may stay on this medication short-term, please discuss staying on it more permanently with your primary care provider. If there is no improvement in your symptoms, or if there is any worsening of symptoms, or if you have any additional concerns, please return for re-evaluation; or, if we are closed, consider going to the Emergency Room for evaluation if symptoms urgent.     ED Prescriptions    None     Controlled Substance Prescriptions [place] Controlled Substance Registry consulted? Not Applicable   Burnard Hawthorne, [place] 10/05/17 [phone]

## 2017-10-07 ENCOUNTER — Encounter: Payer: Self-pay | Admitting: Nurse Practitioner

## 2017-10-07 ENCOUNTER — Other Ambulatory Visit: Payer: Self-pay | Admitting: Nurse Practitioner

## 2017-10-07 ENCOUNTER — Ambulatory Visit
Admission: RE | Admit: 2017-10-07 | Discharge: 2017-10-07 | Disposition: A | Discharge status: Home / Self Care | Payer: PRIVATE HEALTH INSURANCE | Source: Ambulatory Visit | Attending: Nurse Practitioner | Admitting: Nurse Practitioner

## 2017-10-07 ENCOUNTER — Ambulatory Visit (INDEPENDENT_AMBULATORY_CARE_PROVIDER_SITE_OTHER): Payer: PRIVATE HEALTH INSURANCE | Admitting: Nurse Practitioner

## 2017-10-07 VITALS — BP 137/64 | HR 73 | Temp 98.6°F | Ht 68.0 in | Wt 276.4 lb

## 2017-10-07 DIAGNOSIS — R079 Chest pain, unspecified: Secondary | ICD-10-CM

## 2017-10-07 MED ORDER — ALBUTEROL SULFATE HFA 108 (90 BASE) MCG/ACT IN AERS: 2.0000 | INHALATION_SPRAY | Freq: Four times a day (QID) | RESPIRATORY_TRACT | 2 refills | Status: DC | PRN

## 2017-10-07 NOTE — Progress Notes (Signed)
Subjective:    Patient ID: YEE POORMAN, female    DOB: [birth year], 47 y.o.   MRN: [id number]  Caitlin Barrett is a 47 y.o. female presenting on 10/07/2017 for Annual Exam and Chest Pain (elevated bp, w/ presistent chest pain.  Pain worsen w/ movement. The pt had a cardiac work done at Urgent Care  x 2 days ag)   HPI Chest Pain Negative cardiac and pulmonary evaluation at urgent care on 10/05/2017.  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  Chest pain is parasternal.  Pain is sharp with movement.  Pain started initially about 3 days ago and has not improved significantly since that time.  Past Medical History:  Diagnosis Date  . Allergy    seasonal.  Takes allergy shots at [facility]  . Anemia   . Anxiety   . Arthritis   . Asthma    exercise induced--no inhalers  . Cancer ([facility]) 2010   Basal cell skin cancer Lt eye, skin graft above Lt eye  . Depression   . Dysmenorrhea   . Headache   . Hypothyroid    Nodules and Fine needle aspiration  . IBS (irritable bowel syndrome)   . Painful menstrual periods   . Plantar fasciitis   . Sprain of carpal (joint) of wrist    Past Surgical History:  Procedure Laterality Date  . ABDOMINAL HYSTERECTOMY Bilateral 07/02/2016   Procedure: HYSTERECTOMY ABDOMINAL WITH BILATERAL SALPINGECTOMY-MIDLINE INCISION;  Surgeon: Herold Harms, MD;  Location: [facility] ORS;  Service: Gynecology;  Laterality: Bilateral;  . CHOLECYSTECTOMY  2009  . HERNIA REPAIR  1974   umbilical  . SKIN GRAFT  2010  . TONSILLECTOMY     Social History   Socioeconomic History  . Marital status: Married    Spouse name: Not on file  . Number of children: Not on file  . Years of education: Not on file  . Highest education level: Not on file  Occupational History  . Not on file  Social Needs  . Financial resource strain: Not on file  . Food insecurity:    Worry: Not on file    Inability: Not on file  . Transportation  needs:    Medical: Not on file    Non-medical: Not on file  Tobacco Use  . Smoking status: Never Smoker  . Smokeless tobacco: Never Used  Substance and Sexual Activity  . Alcohol use: Yes    Comment: rare  . Drug use: No  . Sexual activity: Yes    Birth control/protection: Surgical  Lifestyle  . Physical activity:    Days per week: Not on file    Minutes per session: Not on file  . Stress: Not on file  Relationships  . Social connections:    Talks on phone: Not on file    Gets together: Not on file    Attends religious service: Not on file    Active member of club or organization: Not on file    Attends meetings of clubs or organizations: Not on file    Relationship status: Not on file  . Intimate partner violence:    Fear of current or ex partner: Not on file    Emotionally abused: Not on file    Physically abused: Not on file    Forced sexual activity: Not on file  Other Topics Concern  . Not on file  Social History Narrative  . Not on  file   Family History  Problem Relation Age of Onset  . Hyperlipidemia Mother   . Hypertension Mother   . Cancer Father        lung CA  . Heart disease Maternal Grandmother   . Stroke Maternal Grandmother   . Hypertension Maternal Grandmother   . Diabetes Maternal Grandmother   . Heart disease Maternal Grandfather   . Diabetes Maternal Grandfather   . Heart attack Maternal Grandfather    Current Outpatient Medications on File Prior to Visit  Medication Sig  . albuterol (PROVENTIL HFA;VENTOLIN HFA) 108 (90 Base) MCG/ACT inhaler Inhale 2 puffs into the lungs every 6 (six) hours as needed for wheezing or shortness of breath.  . AZO-CRANBERRY PO Take by mouth.  . Cranberry-Vitamin C-Probiotic (AZO CRANBERRY PO) Take 2 tablets by mouth once.  . diphenhydrAMINE (BENADRYL ALLERGY) 25 mg capsule Take 25 mg by mouth every 6 (six) hours as needed.  Marland Kitchen estradiol (ESTRACE) 1 MG tablet Take 1 tablet (1 mg total) by mouth daily.  Marland Kitchen ibuprofen  (ADVIL,MOTRIN) 400 MG tablet Take 400 mg by mouth every 6 (six) hours as needed.  Marland Kitchen levothyroxine (SYNTHROID, LEVOTHROID) 50 MCG tablet Take 25 mcg by mouth daily before breakfast.   . Misc Natural Products (OSTEO BI-FLEX ADV TRIPLE ST PO) Take by mouth.  . phenylephrine (SUDAFED PE) 10 MG TABS tablet Take 10 mg by mouth every 4 (four) hours as needed.  . Probiotic Product (TRUBIOTICS) CAPS Take 1 capsule by mouth daily.  . S-Adenosylmethionine (SAME) 400 MG TABS Take 1 tablet by mouth.  Marland Kitchen buPROPion (WELLBUTRIN XL) 150 MG 24 hr tablet Take 1 tablet (150 mg total) by mouth daily. (Patient not taking: Reported on 06/14/2017)  . EPINEPHrine 0.3 mg/0.3 mL IJ SOAJ injection 0.3 mg as needed.   . Fluticasone Furoate (ARNUITY ELLIPTA) 100 MCG/ACT AEPB Inhale 1 Inhaler into the lungs daily. (Patient not taking: Reported on 05/02/2017)  . ipratropium (ATROVENT) 0.06 % nasal spray Place 2 sprays into both nostrils 4 (four) times daily. For up to 5-7 days then stop. (Patient not taking: Reported on 10/07/2017)   No current facility-administered medications on file prior to visit.     Review of Systems Per HPI unless specifically indicated above     Objective:    BP 137/64 (BP Location: Right Arm, Patient Position: Sitting, Cuff Size: Large)   Pulse 73   Temp 98.6 F (37 C) (Oral)   Ht 5\' 8"  (1.727 m)   Wt 276 lb 6.4 oz (125.4 kg)   LMP 05/26/2016 (Exact Date)   BMI 42.03 kg/m   Wt Readings from Last 3 Encounters:  10/07/17 276 lb 6.4 oz (125.4 kg)  10/05/17 278 lb (126.1 kg)  06/14/17 272 lb 12.8 oz (123.7 kg)    Physical Exam  Constitutional: She is oriented to person, place, and time. She appears well-developed and well-nourished. No distress.  HENT:  Head: Normocephalic and atraumatic.  Cardiovascular: Normal rate, regular rhythm, S1 normal, S2 normal, normal heart sounds and intact distal pulses.  Pulmonary/Chest: Effort normal. No respiratory distress. She has no decreased breath sounds.  She has wheezes (throughout). She has no rhonchi. She has no rales. She exhibits no mass, no tenderness, no bony tenderness, no laceration, no crepitus, no edema, no deformity, no swelling and no retraction.  Neurological: She is alert and oriented to person, place, and time.  Skin: Skin is warm and dry.  Psychiatric: She has a normal mood and affect. Her behavior is  normal.  Vitals reviewed.  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  Patient with pain on deep inspiration has very low suspicion for PE.  Wells score 0.  Patient continues to also have upper respiratory congestion with elevated BP.  No clinical suspicion for bronchitis or pneumonia.  Plan: 1. Rib Xray studies today 2. Continue NSAIDs as prescribed by urgent care 3. START albuterol q6hr prn for wheezing.  Encourage deep breathing 4. STOP sudafed as patient is mildly hypertensive.  START Claritin 10 mg daily instead. 5. Labs tomorrow for CBC, D-dimer.  6. Followup 2 weeks for physical as scheduled.   Meds ordered this encounter  Medications  . albuterol (PROVENTIL HFA;VENTOLIN HFA) 108 (90 Base) MCG/ACT inhaler    Sig: Inhale 2 puffs into the lungs every 6 (six) hours as needed for wheezing or shortness of breath.    Dispense:  1 Inhaler    Refill:  2    Order Specific Question:   Supervising Provider    Answer:   Smitty Cords [[id number]]     Follow up plan: Return in about 2 weeks (around  10/21/2017) for annual physical.    Wilhelmina Mcardle, DNP, AGPCNP-BC Adult Gerontology Primary Care Nurse Practitioner [facility] 10/07/2017, 3:26 PM

## 2017-10-07 NOTE — Patient Instructions (Addendum)
Maceo Pro,   Thank you for coming in to clinic today.  1. STOP sudafed. - START claritin or loratadine generic 10 mg once daily.  2. Chest xray today  3. Labs in clinic tomorrow.  Please schedule a follow-up appointment with Cassell Smiles, AGNP. Return in about 2 weeks (around 10/21/2017) for annual physical.  If you have any other questions or concerns, please feel free to call the clinic or send a message through [place]. You may also schedule an earlier appointment if necessary.  You will receive a survey after today's visit either digitally by e-mail or paper by [employer]. Your experiences and feedback matter to [place].  Please respond so we know how we are doing as we provide care for you.   Cassell Smiles, DNP, AGNP-BC Adult Gerontology Nurse Practitioner [place]

## 2017-10-08 ENCOUNTER — Telehealth: Payer: Self-pay | Admitting: Nurse Practitioner

## 2017-10-08 ENCOUNTER — Ambulatory Visit: Payer: PRIVATE HEALTH INSURANCE

## 2017-10-08 ENCOUNTER — Encounter: Payer: Self-pay | Admitting: Nurse Practitioner

## 2017-10-08 ENCOUNTER — Other Ambulatory Visit: Payer: PRIVATE HEALTH INSURANCE

## 2017-10-08 DIAGNOSIS — R278 Other lack of coordination: Secondary | ICD-10-CM

## 2017-10-08 DIAGNOSIS — M62838 Other muscle spasm: Secondary | ICD-10-CM

## 2017-10-08 DIAGNOSIS — M791 Myalgia, unspecified site: Secondary | ICD-10-CM

## 2017-10-08 DIAGNOSIS — M6281 Muscle weakness (generalized): Secondary | ICD-10-CM

## 2017-10-08 NOTE — Therapy (Signed)
[facility] MAIN [facility] SERVICES [address] [place], [place], [address] Phone: [phone]   Fax:  [phone]  Physical Therapy Treatment  Patient Details  Name: Caitlin Barrett MRN: [id number] Date of Birth: Jan 22, 1971 Referring Provider: Wilhelmina Mcardle   Encounter Date: 10/08/2017  PT End of Session - 10/08/17 1034    Visit Number  15    Number of Visits  19    Date for PT Re-Evaluation  11/04/17    PT Start Time  0930    PT Stop Time  1030    PT Time Calculation (min)  60 min    Activity Tolerance  Patient tolerated treatment well;No increased pain    Behavior During Therapy  WFL for tasks assessed/performed       Past Medical History:  Diagnosis Date  . Allergy    seasonal.  Takes allergy shots at [facility]  . Anemia   . Anxiety   . Arthritis   . Asthma    exercise induced--no inhalers  . Cancer (HCC) 2010   Basal cell skin cancer Lt eye, skin graft above Lt eye  . Depression   . Dysmenorrhea   . Headache   . Hypothyroid    Nodules and Fine needle aspiration  . IBS (irritable bowel syndrome)   . Painful menstrual periods   . Plantar fasciitis   . Sprain of carpal (joint) of wrist     Past Surgical History:  Procedure Laterality Date  . ABDOMINAL HYSTERECTOMY Bilateral 07/02/2016   Procedure: HYSTERECTOMY ABDOMINAL WITH BILATERAL SALPINGECTOMY-MIDLINE INCISION;  Surgeon: Herold Harms, MD;  Location: [facility] ORS;  Service: Gynecology;  Laterality: Bilateral;  . CHOLECYSTECTOMY  2009  . HERNIA REPAIR  1974   umbilical  . SKIN GRAFT  2010  . TONSILLECTOMY      There were no vitals filed for this visit.    Pelvic Floor Physical Therapy Treatment Note  SCREENING  Changes in medications, allergies, or medical history?: Decreased her levothyroxine to 25 mg.    SUBJECTIVE  Patient reports: She is hurting in her chest. Pain started Friday evening. She has had an EKG and blood work done but they are  treating it like GERD but it has not been helping.vHas been doing ok with what we have been working on but not able to have intercourse because of being busy and then dealing with new health issue.  Pain update:  Location of pain: anterior chest near sternum and upper and lower back to lesser degree. Current pain:  6/10  Max pain:  7/10 Least pain:  2/10 Nature of pain: intense ache/burning sensation.  Patient Goals: No Pain with Intercourse, maintaining improvement in urinary symptoms while increasing water intake to decrease constipation. No return of pelvic pain.   OBJECTIVE  Changes in: Posture/Observations:  Pt appears to be in distress, teary, defeated looking.  Range of Motion/Flexibilty:  Decreased mobility to upper thoracic spine and costovertebral and costochondral  Joints at ~ T2-T3.  Palpation: TTP at pectoralis major muscles near sternum B, re-creating pain and laterally without Sx. Reproduction.  INTERVENTIONS THIS SESSION: Manual: Performed TP release B at medial pec Major and performed grade 3-4 mobs at T@ and T3 costochondral and costovertebral joints as well as through T2-5 spinous processes to decrease stiffness and spasm and decrease pain in the chest and back.  Self-care: educated Pt. On the fact that it is good news that we could affect her pain with manual therapy, because it  makes the likelihood of an underlying heart problem less likely. Discussed potential to have a chiropractor and/or massage therapist continue to work on the areas we were treating and to ask her MD about the potential of a muscle relaxer to help decrease pain. Therex: reviewed thoracic extensions over towel roll with modification to target costovertebral joint, on extensions over a chair back, and on using deep breathing with doorway stretch to help mobilize ribs anteriorly. Performed deep breathing with posterior and anterior rib opening positions to help decrease pain.   Total  time:                          PT Education - 10/08/17 1036    Education provided  Yes    Education Details  See Interventions this session    Person(s) Educated  Patient    Methods  Explanation;Demonstration;Verbal cues    Comprehension  Verbalized understanding;Returned demonstration;Verbal cues required       PT Short Term Goals - 07/23/17 1815      PT SHORT TERM GOAL #1   Title  Patient will demonstrate a coordinated contraction, relaxation, and bulge of the pelvic floor muscles to demonstrate functional recruitment and motion and allow for further strengthening.    Time  6    Period  Weeks    Status  Achieved    Target Date  06/03/17      PT SHORT TERM GOAL #2   Title  Patient will demonstrate appropriate body mechanics with bending and lifting heavy objects to allow for decreased stress on the pelvic floor and low back.     Time  6    Period  Weeks    Status  Achieved    Target Date  06/03/17      PT SHORT TERM GOAL #3   Title  Patient will demonstrate HEP x1 in the clinic to demonstrate understanding and proper form to allow for further improvement.    Time  6    Period  Weeks    Status  Achieved    Target Date  06/03/17        PT Long Term Goals - 09/27/17 1204      PT LONG TERM GOAL #1   Title  Patient will report no episodes of UI over the course of the prior two weeks to demonstrate improved functional ability.    Time  12    Period  Weeks    Status  Achieved    Target Date  07/15/17      PT LONG TERM GOAL #2   Title  Patient will report no pain with intercourse to demonstrate improved functional ability.    Time  12    Period  Weeks    Status  On-going    Target Date  11/04/17      PT LONG TERM GOAL #3   Title  Patient will score at or below 64 on the PFDI and 8/43 on the Female NIH-CPSI to demonstrate a clinically meaningful decrease in disability and distress due to pelvic floor dysfunction.    Baseline  NIH-CPSI:13/43,  PFDI: 44/300 (4/30)    Time  12    Period  Weeks    Status  On-going    Target Date  11/04/17            Plan - 10/08/17 1036    Clinical Impression Statement  Pt arrived today with chest pain that has been  present since Friday 7/12 and of unknown origin. Chest X-ray determined no displace fracture or pneumothorax and blood work and EKG were negative so we attempted to rule in/out musculoskeletal pain and  were able to intensify pain with plpation to medial pectoral and intercostal palpation as well as posteriorly with costovertebral joint mobs at ~ T2 and costochondral joint  anteriorly at 2d and 3rd ribs. We were able to reduce her pain by 2 points from 6/10 to 4/10 with treatment and Pt. was educated on how to continue to mobilize the ribs and use deep breathing to increase motion. Also reccommended that massage, chiropractic, and asking her MD about potential use of muscle relaxers to decrease spasm and allow for improved mobility and decreased pain are all potential options for greater pain relief.  Patient is known to have pectoral tightness and spasm and prior to this the plan was alreadt to treat pectoral tightness to improve posture and allow for long-term relief of PFM spasms. Pt. states that she was doing well with her other exercises prior to this onset and was feeling good but had been busy and had not tried intercourse again since last visit. Continue per POC.    Clinical Presentation  Stable    Clinical Decision Making  Moderate    Rehab Potential  Good    Clinical Impairments Affecting Rehab Potential  Hypothyroidism, depression, early menopause, obesity.    PT Frequency  1x / week    PT Duration  6 weeks    PT Treatment/Interventions  ADLs/Self Care Home Management;Biofeedback;Aquatic Therapy;Electrical Stimulation;Moist Heat;Traction;Functional mobility training;Therapeutic activities;Therapeutic exercise;Balance training;Neuromuscular re-education;Manual  techniques;Patient/family education;Dry needling;Taping    PT Next Visit Plan  TP release to LB and anterior chest/shoulders/DN?, add rows with band and external rotation with band.    PT Home Exercise Plan  diaphragmatic breathing, adductor stretch, child's pose, lengthening kegels (3 sec long-holds), sit-to-stand, squat form, log-roll, soda-can, squatty potty , TA in quadruped, pelvic tilts on ball, shoulder retractions, chin-tucks, rows, towel-roll extensions., bird-dog arms    Consulted and Agree with Plan of Care  Patient       Patient will benefit from skilled therapeutic intervention in order to improve the following deficits and impairments:  Increased fascial restricitons, Improper body mechanics, Pain, Decreased coordination, Decreased mobility, Decreased scar mobility, Increased muscle spasms, Postural dysfunction, Decreased activity tolerance, Decreased endurance, Decreased range of motion, Decreased strength, Decreased balance, Obesity  Visit Diagnosis: Other muscle spasm  Other lack of coordination  Muscle weakness (generalized)  Myalgia     Problem List Patient Active Problem List   Diagnosis Date Noted  . Monilial vaginitis 02/26/2017  . Vaginal dryness 02/26/2017  . Decreased libido 02/26/2017  . Anxiety with depression 02/26/2017  . Dyspareunia, female 02/26/2017  . S/P TAH (total abdominal hysterectomy) 07/02/2016  . 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 Nocturia 07/21/2015  . Stress incontinence 07/21/2015  . 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 Allergic rhinitis 06/21/2015  . Obesity (BMI 30-39.9) 06/21/2015   Cleophus Molt DPT, ATC Cleophus Molt 10/08/2017, 10:58 AM  [facility] MAIN [facility] SERVICES [address] [place], [place], [address] Phone: [phone]   Fax:  [phone]  Name: Caitlin Barrett MRN: [id number] Date of Birth: [birth year]

## 2017-10-08 NOTE — Telephone Encounter (Signed)
Pt called for xray results [phone]

## 2017-10-09 ENCOUNTER — Telehealth: Payer: Self-pay | Admitting: Nurse Practitioner

## 2017-10-09 DIAGNOSIS — R0789 Other chest pain: Secondary | ICD-10-CM

## 2017-10-09 LAB — CBC WITH DIFFERENTIAL/PLATELET
Basophils Absolute: 59 cells/uL (ref 0–200)
Basophils Relative: 0.6 %
Eosinophils Absolute: 1406 cells/uL — ABNORMAL HIGH (ref 15–500)
Eosinophils Relative: 14.2 %
HCT: 38.7 % (ref 35.0–45.0)
Hemoglobin: 12.9 g/dL (ref 11.7–15.5)
Lymphs Abs: 2218 cells/uL (ref 850–3900)
MCH: 30.1 pg (ref 27.0–33.0)
MCHC: 33.3 g/dL (ref 32.0–36.0)
MCV: 90.2 fL (ref 80.0–100.0)
MPV: 9.1 fL (ref 7.5–12.5)
Monocytes Relative: 6.4 %
Neutro Abs: 5584 cells/uL (ref 1500–7800)
Neutrophils Relative %: 56.4 %
Platelets: 383 10*3/uL (ref 140–400)
RBC: 4.29 10*6/uL (ref 3.80–5.10)
RDW: 11.8 % (ref 11.0–15.0)
Total Lymphocyte: 22.4 %
WBC mixed population: 634 cells/uL (ref 200–950)
WBC: 9.9 10*3/uL (ref 3.8–10.8)

## 2017-10-09 LAB — T4, FREE: Free T4: 0.9 ng/dL (ref 0.8–1.8)

## 2017-10-09 LAB — T3, FREE: T3, Free: 3 pg/mL (ref 2.3–4.2)

## 2017-10-09 MED ORDER — BACLOFEN 10 MG PO TABS: 5.0000 mg | ORAL_TABLET | Freq: Every evening | ORAL | 0 refills | Status: DC | PRN

## 2017-10-09 NOTE — Telephone Encounter (Signed)
See telephone call encounter from 10/09/17.

## 2017-10-09 NOTE — Telephone Encounter (Signed)
Patient called with continued soreness and aching over his front of left chest.  Pain worsens with applying pressure to the muscles over her chest.  Patient also has some pain in her back behind her right scapula.  At physical therapy yesterday patient had some trigger point release that improved her pain symptoms briefly.  Pain has returned today.  Physical therapist recommended to consider muscle relaxer.  Also asks patient if she may have nerve impingement as cause of pain.  Will defer neuropathic pain agents for now.  Start baclofen 10 mg at bedtime.  Reviewed cardiac chest pain symptoms and when patient to seek care at the emergency room.  Patient verbalizes understanding.

## 2017-10-14 ENCOUNTER — Encounter (INDEPENDENT_AMBULATORY_CARE_PROVIDER_SITE_OTHER): Payer: Self-pay

## 2017-10-15 ENCOUNTER — Encounter: Payer: PRIVATE HEALTH INSURANCE | Admitting: Obstetrics and Gynecology

## 2017-10-17 ENCOUNTER — Ambulatory Visit: Payer: PRIVATE HEALTH INSURANCE

## 2017-10-17 DIAGNOSIS — R278 Other lack of coordination: Secondary | ICD-10-CM

## 2017-10-17 DIAGNOSIS — M62838 Other muscle spasm: Secondary | ICD-10-CM

## 2017-10-17 DIAGNOSIS — M6281 Muscle weakness (generalized): Secondary | ICD-10-CM

## 2017-10-17 DIAGNOSIS — M791 Myalgia, unspecified site: Secondary | ICD-10-CM

## 2017-10-17 NOTE — Therapy (Signed)
[place] MAIN [facility] SERVICES [address] [place], [place], [address] Phone: [phone]   Fax:  [phone]  Physical Therapy Treatment  Patient Details  Name: Caitlin Barrett MRN: [id number] Date of Birth: [birth year] Referring Provider: Cassell Smiles   Encounter Date: 10/17/2017  PT End of Session - 10/20/17 1620    Visit Number  16    Number of Visits  19    Date for PT Re-Evaluation  11/04/17    PT Start Time  1630    PT Stop Time  1730    PT Time Calculation (min)  60 min    Activity Tolerance  Patient tolerated treatment well;No increased pain    Behavior During Therapy  WFL for tasks assessed/performed       Past Medical History:  Diagnosis Date  . Allergy    seasonal.  Takes allergy shots at [facility]  . Anemia   . Anxiety   . Arthritis   . Asthma    exercise induced--no inhalers  . Cancer ([place]) 2010   Basal cell skin cancer Lt eye, skin graft above Lt eye  . Depression   . Dysmenorrhea   . Headache   . Hypothyroid    Nodules and Fine needle aspiration  . IBS (irritable bowel syndrome)   . Painful menstrual periods   . Plantar fasciitis   . Sprain of carpal (joint) of wrist     Past Surgical History:  Procedure Laterality Date  . ABDOMINAL HYSTERECTOMY Bilateral 07/02/2016   Procedure: HYSTERECTOMY ABDOMINAL WITH BILATERAL SALPINGECTOMY-MIDLINE INCISION;  Surgeon: Brayton Mars, MD;  Location: [facility] ORS;  Service: Gynecology;  Laterality: Bilateral;  . CHOLECYSTECTOMY  2009  . HERNIA REPAIR  5009   umbilical  . SKIN GRAFT  2010  . TONSILLECTOMY      There were no vitals filed for this visit.    Pelvic Floor Physical Therapy Treatment Note  SCREENING  Changes in medications, allergies, or medical history?: added a muscle relaxer.     SUBJECTIVE  Patient reports: Feeling a lot better than last visit, has been taking a muscle relaxer and now feels some pain just centrally and at her R  shoulder blade more at the end of the day or if she has to throw or pull. Is able to use exercises to help decrease pain. Can take just half a muscle relaxer at night. Tried to use the tool and was unsure about how it went. Was able to have intercourse with minimal "pinch" with initial arousal but no other pain during or after.   Pain update:  Location of pain: Middle of ches and R shoulder Current pain:  3/10  Max pain:  5/10 Least pain:  1/10 Nature of pain: burning  Patient Goals: No Pain with Intercourse, maintaining improvement in urinary symptoms while increasing water intake to decrease constipation. No return of pelvic pain.    OBJECTIVE  Changes in: Posture/Observations:  More upright posture today with visibly less pain than prior pain.  Range of Motion/Flexibilty:  Decreased mobility and tender to pressure along R ~T5-7 costovertebral joints and at spinous process.  Palpation: TTP to B medial pec major and R thoracic paraspinals.   INTERVENTIONS THIS SESSION: Manual: performed TP release to R thoracic Paraspinals as well as grade 3-4 PA mobs to ~T5-7 costovertebral joints and spinous processes to improve mobility and decrease pain.  Dry needle: standard approach TPDN with .30x67m needle B medial pec major fibers along the  [place] MAIN [facility] SERVICES [address] [place], [place], [address] Phone: [phone]   Fax:  [phone]  Physical Therapy Treatment  Patient Details  Name: Caitlin Barrett MRN: [id number] Date of Birth: [birth year] Referring Provider: Cassell Smiles   Encounter Date: 10/17/2017  PT End of Session - 10/20/17 1620    Visit Number  16    Number of Visits  19    Date for PT Re-Evaluation  11/04/17    PT Start Time  1630    PT Stop Time  1730    PT Time Calculation (min)  60 min    Activity Tolerance  Patient tolerated treatment well;No increased pain    Behavior During Therapy  WFL for tasks assessed/performed       Past Medical History:  Diagnosis Date  . Allergy    seasonal.  Takes allergy shots at [facility]  . Anemia   . Anxiety   . Arthritis   . Asthma    exercise induced--no inhalers  . Cancer ([place]) 2010   Basal cell skin cancer Lt eye, skin graft above Lt eye  . Depression   . Dysmenorrhea   . Headache   . Hypothyroid    Nodules and Fine needle aspiration  . IBS (irritable bowel syndrome)   . Painful menstrual periods   . Plantar fasciitis   . Sprain of carpal (joint) of wrist     Past Surgical History:  Procedure Laterality Date  . ABDOMINAL HYSTERECTOMY Bilateral 07/02/2016   Procedure: HYSTERECTOMY ABDOMINAL WITH BILATERAL SALPINGECTOMY-MIDLINE INCISION;  Surgeon: Brayton Mars, MD;  Location: [facility] ORS;  Service: Gynecology;  Laterality: Bilateral;  . CHOLECYSTECTOMY  2009  . HERNIA REPAIR  5009   umbilical  . SKIN GRAFT  2010  . TONSILLECTOMY      There were no vitals filed for this visit.    Pelvic Floor Physical Therapy Treatment Note  SCREENING  Changes in medications, allergies, or medical history?: added a muscle relaxer.     SUBJECTIVE  Patient reports: Feeling a lot better than last visit, has been taking a muscle relaxer and now feels some pain just centrally and at her R  shoulder blade more at the end of the day or if she has to throw or pull. Is able to use exercises to help decrease pain. Can take just half a muscle relaxer at night. Tried to use the tool and was unsure about how it went. Was able to have intercourse with minimal "pinch" with initial arousal but no other pain during or after.   Pain update:  Location of pain: Middle of ches and R shoulder Current pain:  3/10  Max pain:  5/10 Least pain:  1/10 Nature of pain: burning  Patient Goals: No Pain with Intercourse, maintaining improvement in urinary symptoms while increasing water intake to decrease constipation. No return of pelvic pain.    OBJECTIVE  Changes in: Posture/Observations:  More upright posture today with visibly less pain than prior pain.  Range of Motion/Flexibilty:  Decreased mobility and tender to pressure along R ~T5-7 costovertebral joints and at spinous process.  Palpation: TTP to B medial pec major and R thoracic paraspinals.   INTERVENTIONS THIS SESSION: Manual: performed TP release to R thoracic Paraspinals as well as grade 3-4 PA mobs to ~T5-7 costovertebral joints and spinous processes to improve mobility and decrease pain.  Dry needle: standard approach TPDN with .30x67m needle B medial pec major fibers along the  PT Next Visit Plan  review HEP and re-check and posture/allignment and PFM PRN to determine POC    PT Home Exercise Plan  diaphragmatic breathing, adductor stretch, child's pose, lengthening kegels (3 sec long-holds), sit-to-stand, squat form, log-roll, soda-can, squatty potty , TA in quadruped, pelvic tilts on ball, shoulder retractions, chin-tucks, rows, towel-roll extensions., bird-dog arms    Consulted and Agree with Plan of Care  Patient       Patient will benefit from skilled therapeutic intervention in order to improve the following deficits and impairments:  Increased fascial restricitons, Improper body mechanics, Pain, Decreased coordination, Decreased mobility, Decreased scar mobility, Increased muscle spasms, Postural dysfunction, Decreased activity tolerance, Decreased endurance, Decreased range of motion, Decreased strength, Decreased balance, Obesity  Visit Diagnosis: Other muscle spasm  Other lack of coordination  Muscle weakness (generalized)  Myalgia     Problem List Patient Active Problem List   Diagnosis Date Noted  . Monilial vaginitis 02/26/2017  . Vaginal dryness 02/26/2017  . Decreased libido 02/26/2017  . Anxiety with depression 02/26/2017  . Dyspareunia, female 02/26/2017  . S/P TAH (total abdominal hysterectomy) 07/02/2016  . 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 Nocturia 07/21/2015  . Stress incontinence 07/21/2015  . 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 Allergic rhinitis 06/21/2015  . Obesity (BMI 30-39.9) 06/21/2015   Willa Rough DPT, ATC Willa Rough 10/20/2017, 4:25 PM  [place] MAIN [facility] SERVICES [address] [place], [place],  [address] Phone: [phone]   Fax:  [phone]  Name: SARIYAH CORCINO MRN: [id number] Date of Birth: 08/10/1970

## 2017-10-21 ENCOUNTER — Ambulatory Visit: Payer: PRIVATE HEALTH INSURANCE

## 2017-10-21 DIAGNOSIS — M791 Myalgia, unspecified site: Secondary | ICD-10-CM

## 2017-10-21 DIAGNOSIS — M62838 Other muscle spasm: Secondary | ICD-10-CM

## 2017-10-21 DIAGNOSIS — M6281 Muscle weakness (generalized): Secondary | ICD-10-CM

## 2017-10-21 DIAGNOSIS — R278 Other lack of coordination: Secondary | ICD-10-CM

## 2017-10-21 NOTE — Therapy (Signed)
pelvic floor dysfunction.    Baseline  NIH-CPSI:13/43, PFDI: 44/300 (4/30)    Time  12    Period  Weeks    Status  On-going    Target Date  11/04/17            Plan - 10/22/17 0851    Clinical Impression Statement  Pt. has continued reduction of chest and shoulder pain from previous treatment with some remaining at beginning of session. Her familiar referral pattern was elicited with palpation and treaatment to R Supra and infraspinatus as well as with direct palpation to pectoral fibers on the R and throgh her R deltoids so these areas were treated today for further pain releif to allow patient to return her attention to her pelvic health HEP for continued symptom reduction and management. SHe reported pain of 4/10 at beginning of session and only "achy" from manual treatment following.  Continue per POC to solidify painn reduction and understanding/correct performance of HEP for maintained pain and symptom relief.    Clinical Presentation  Stable    Clinical Decision Making  Moderate    Rehab Potential  Good    Clinical Impairments Affecting Rehab Potential  Hypothyroidism, depression, early menopause, obesity.    PT Frequency  1x / week    PT Duration  6 weeks    PT Treatment/Interventions  ADLs/Self Care Home Management;Biofeedback;Aquatic Therapy;Electrical Stimulation;Moist Heat;Traction;Functional mobility training;Therapeutic activities;Therapeutic exercise;Balance training;Neuromuscular re-education;Manual techniques;Patient/family education;Dry needling;Taping    PT Next Visit Plan  review HEP and re-check and posture/allignment, strength, and PFM PRN to determine POC    PT Home Exercise Plan   diaphragmatic breathing, adductor stretch, child's pose, lengthening kegels (3 sec long-holds), sit-to-stand, squat form, log-roll, soda-can, squatty potty , TA in quadruped, pelvic tilts on ball, shoulder retractions, chin-tucks, rows, towel-roll extensions., bird-dog arms    Consulted and Agree with Plan of Care  Patient       Patient will benefit from skilled therapeutic intervention in order to improve the following deficits and impairments:  Increased fascial restricitons, Improper body mechanics, Pain, Decreased coordination, Decreased mobility, Decreased scar mobility, Increased muscle spasms, Postural dysfunction, Decreased activity tolerance, Decreased endurance, Decreased range of motion, Decreased strength, Decreased balance, Obesity  Visit Diagnosis: Other muscle spasm  Other lack of coordination  Muscle weakness (generalized)  Myalgia     Problem List Patient Active Problem List   Diagnosis Date Noted  . Monilial vaginitis 02/26/2017  . Vaginal dryness 02/26/2017  . Decreased libido 02/26/2017  . Anxiety with depression 02/26/2017  . Dyspareunia, female 02/26/2017  . S/P TAH (total abdominal hysterectomy) 07/02/2016  . 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 Nocturia 07/21/2015  . Stress incontinence 07/21/2015  . 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 Allergic rhinitis 06/21/2015  . Obesity (BMI 30-39.9) 06/21/2015   Willa Rough DPT, ATC Willa Rough 10/22/2017, 9:02 AM  [place] MAIN [facility] SERVICES [address] [place], [place], [address] Phone: [phone]   Fax:  [phone]  Name: Caitlin Barrett MRN: [id number] Date of Birth: 21-Aug-1970  [place] MAIN [facility] SERVICES [address] [place], [place], [address] Phone: [phone]   Fax:  [phone]  Physical Therapy Treatment  Patient Details  Name: Caitlin Barrett MRN: [id number] Date of Birth: [birth year] Referring Provider: Cassell Smiles   Encounter Date: 10/21/2017  PT End of Session - 10/22/17 0850    Visit Number  17    Number of Visits  19    Date for PT Re-Evaluation  11/04/17    PT Start Time  1500    PT Stop Time  1600    PT Time Calculation (min)  60 min    Activity Tolerance  Patient tolerated treatment well;No increased pain    Behavior During Therapy  WFL for tasks assessed/performed       Past Medical History:  Diagnosis Date  . Allergy    seasonal.  Takes allergy shots at [facility]  . Anemia   . Anxiety   . Arthritis   . Asthma    exercise induced--no inhalers  . Cancer ([place]) 2010   Basal cell skin cancer Lt eye, skin graft above Lt eye  . Depression   . Dysmenorrhea   . Headache   . Hypothyroid    Nodules and Fine needle aspiration  . IBS (irritable bowel syndrome)   . Painful menstrual periods   . Plantar fasciitis   . Sprain of carpal (joint) of wrist     Past Surgical History:  Procedure Laterality Date  . ABDOMINAL HYSTERECTOMY Bilateral 07/02/2016   Procedure: HYSTERECTOMY ABDOMINAL WITH BILATERAL SALPINGECTOMY-MIDLINE INCISION;  Surgeon: Brayton Mars, MD;  Location: [facility] ORS;  Service: Gynecology;  Laterality: Bilateral;  . CHOLECYSTECTOMY  2009  . HERNIA REPAIR  [id number]   umbilical  . SKIN GRAFT  2010  . TONSILLECTOMY      There were no vitals filed for this visit.    Pelvic Floor Physical Therapy Treatment Note  SCREENING  Changes in medications, allergies, or medical history?: no    SUBJECTIVE  Patient reports: She is still having some chest pain, greater in the afternoon/evening and it seems to be more on the R side than the L. Pain is less and less persistent  but still sometimes present in the R shoulder. No other changes, some pretty mild LBP with bending and picking things up. She has been using supine chin tucks and self TP release in temporalis to help her moderate tension headaches.   Pain update:  Location of pain: R side of anterior chest and R shoulder blade Current pain:  4/10  Max pain:  4/10 Least pain:  1/10 Nature of pain: achy in Chest with occasional sharp.  Patient Goals: No Pain with Intercourse, maintaining improvement in urinary symptoms while increasing water intake to decrease constipation. No return of pelvic pain.    OBJECTIVE  Changes in: Posture/Observations:  Mild forward head but otherwise much improved.  Range of Motion/Flexibilty:  Some decreased mobility at ~ T5  Palpation: TTP through R  Supra and infraspinatus as well as medial pectoral and anterior and middle deltoid muscles   INTERVENTIONS THIS SESSION: Manual: performed STM and TP release to R Supra and infraspinatus as well as medial pectoral and anterior and middle deltoid muscles followed by grade 3-4 PA mobs to T5 spinous process to decrease pain and spasm that is impeding pt's ability to function and perform her HEP regularly.  Dry needle: Performed TPDN with standard approach and .25.23m needle to R Supra and  pelvic floor dysfunction.    Baseline  NIH-CPSI:13/43, PFDI: 44/300 (4/30)    Time  12    Period  Weeks    Status  On-going    Target Date  11/04/17            Plan - 10/22/17 0851    Clinical Impression Statement  Pt. has continued reduction of chest and shoulder pain from previous treatment with some remaining at beginning of session. Her familiar referral pattern was elicited with palpation and treaatment to R Supra and infraspinatus as well as with direct palpation to pectoral fibers on the R and throgh her R deltoids so these areas were treated today for further pain releif to allow patient to return her attention to her pelvic health HEP for continued symptom reduction and management. SHe reported pain of 4/10 at beginning of session and only "achy" from manual treatment following.  Continue per POC to solidify painn reduction and understanding/correct performance of HEP for maintained pain and symptom relief.    Clinical Presentation  Stable    Clinical Decision Making  Moderate    Rehab Potential  Good    Clinical Impairments Affecting Rehab Potential  Hypothyroidism, depression, early menopause, obesity.    PT Frequency  1x / week    PT Duration  6 weeks    PT Treatment/Interventions  ADLs/Self Care Home Management;Biofeedback;Aquatic Therapy;Electrical Stimulation;Moist Heat;Traction;Functional mobility training;Therapeutic activities;Therapeutic exercise;Balance training;Neuromuscular re-education;Manual techniques;Patient/family education;Dry needling;Taping    PT Next Visit Plan  review HEP and re-check and posture/allignment, strength, and PFM PRN to determine POC    PT Home Exercise Plan   diaphragmatic breathing, adductor stretch, child's pose, lengthening kegels (3 sec long-holds), sit-to-stand, squat form, log-roll, soda-can, squatty potty , TA in quadruped, pelvic tilts on ball, shoulder retractions, chin-tucks, rows, towel-roll extensions., bird-dog arms    Consulted and Agree with Plan of Care  Patient       Patient will benefit from skilled therapeutic intervention in order to improve the following deficits and impairments:  Increased fascial restricitons, Improper body mechanics, Pain, Decreased coordination, Decreased mobility, Decreased scar mobility, Increased muscle spasms, Postural dysfunction, Decreased activity tolerance, Decreased endurance, Decreased range of motion, Decreased strength, Decreased balance, Obesity  Visit Diagnosis: Other muscle spasm  Other lack of coordination  Muscle weakness (generalized)  Myalgia     Problem List Patient Active Problem List   Diagnosis Date Noted  . Monilial vaginitis 02/26/2017  . Vaginal dryness 02/26/2017  . Decreased libido 02/26/2017  . Anxiety with depression 02/26/2017  . Dyspareunia, female 02/26/2017  . S/P TAH (total abdominal hysterectomy) 07/02/2016  . 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 Nocturia 07/21/2015  . Stress incontinence 07/21/2015  . 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 Allergic rhinitis 06/21/2015  . Obesity (BMI 30-39.9) 06/21/2015   Willa Rough DPT, ATC Willa Rough 10/22/2017, 9:02 AM  [place] MAIN [facility] SERVICES [address] [place], [place], [address] Phone: [phone]   Fax:  [phone]  Name: Caitlin Barrett MRN: [id number] Date of Birth: 21-Aug-1970

## 2017-10-21 NOTE — Patient Instructions (Signed)
  Lean forward and hold for 5 breaths, repeat 2-3 times once a day.  2) with your bird-dog exercise, upgrade from arms-only to adding legs individually, working in a circle. Eventually you will progress to one-arm, one-leg when this becomes easy.

## 2017-10-22 ENCOUNTER — Ambulatory Visit (INDEPENDENT_AMBULATORY_CARE_PROVIDER_SITE_OTHER): Payer: PRIVATE HEALTH INSURANCE | Admitting: Nurse Practitioner

## 2017-10-22 ENCOUNTER — Other Ambulatory Visit: Payer: Self-pay

## 2017-10-22 ENCOUNTER — Encounter: Payer: Self-pay | Admitting: Nurse Practitioner

## 2017-10-22 VITALS — BP 132/76 | HR 77 | Temp 98.4°F | Ht 68.0 in | Wt 277.8 lb

## 2017-10-22 DIAGNOSIS — R0789 Other chest pain: Secondary | ICD-10-CM

## 2017-10-22 DIAGNOSIS — Z1239 Encounter for other screening for malignant neoplasm of breast: Secondary | ICD-10-CM

## 2017-10-22 DIAGNOSIS — Z0001 Encounter for general adult medical examination with abnormal findings: Secondary | ICD-10-CM | POA: Diagnosis not present

## 2017-10-22 DIAGNOSIS — F5101 Primary insomnia: Secondary | ICD-10-CM

## 2017-10-22 MED ORDER — BACLOFEN 10 MG PO TABS
5.0000 mg | ORAL_TABLET | Freq: Every evening | ORAL | 2 refills | Status: DC | PRN
Start: 2017-10-22 — End: 2018-01-28

## 2017-10-22 NOTE — Patient Instructions (Addendum)
Caitlin Barrett,   Thank you for coming in to clinic today.  1. Sleep Hygiene Tips  Take medicines only as directed by your health care provider.  Keep regular sleeping and waking hours. Avoid naps.  Keep a sleep diary to help you and your health care provider figure out what could be causing your insomnia. Include:  When you sleep.  When you wake up during the night.  How well you sleep.  How rested you feel the next day.  Any side effects of medicines you are taking.  What you eat and drink.  Make your bedroom a comfortable place where it is easy to fall asleep:  Put up shades or special blackout curtains to block light from outside.  Use a white noise machine to block noise.  Keep the temperature cool.  Exercise regularly as directed by your health care provider. Avoid exercising right before bedtime.  Use relaxation techniques to manage stress. Ask your health care provider to suggest some techniques that may work well for you. These may include:  Breathing exercises.  Routines to release muscle tension.  Visualizing peaceful scenes.  Cut back on alcohol, caffeinated beverages, and cigarettes, especially close to bedtime. These can disrupt your sleep.  Do not overeat or eat spicy foods right before bedtime. This can lead to digestive discomfort that can make it hard for you to sleep.  Limit screen use before bedtime. This includes:  Watching TV.  Using your smartphone, tablet, and computer.  Stick to a routine. This can help you fall asleep faster. Try to do a quiet activity, brush your teeth, and go to bed at the same time each night.  Get out of bed if you are still awake after 15 minutes of trying to sleep. Keep the lights down, but try reading or doing a quiet activity. When you feel sleepy, go back to bed.  Make sure that you drive carefully. Avoid driving if you feel very sleepy.  Keep all follow-up appointments as directed by your health care  provider. This is important.  2. For weight loss: eat balanced diet for good carbohydrates (low glycemic handout), healthy fats, and adequate protein.   - Keep a food log with your food planning for staying on track.  This can help you see patterns of poor eating as well.  Please schedule a follow-up appointment with Cassell Smiles, AGNP. Return in about 1 year (around 10/23/2018) for annual physical.  If you have any other questions or concerns, please feel free to call the clinic or send a message through [place]. You may also schedule an earlier appointment if necessary.  You will receive a survey after today's visit either digitally by e-mail or paper by [employer]. Your experiences and feedback matter to [place].  Please respond so we know how we are doing as we provide care for you.   Cassell Smiles, DNP, AGNP-BC Adult Gerontology Nurse Practitioner [place]

## 2017-10-22 NOTE — Progress Notes (Signed)
Subjective:    Patient ID: Caitlin Barrett, female    DOB: Oct 28, 1970, 47 y.o.   MRN: [id number]  Caitlin Barrett is a 47 y.o. female presenting on 10/22/2017 for Annual Exam   HPI Annual Physical Exam Patient has been feeling well.  They have acute concerns today for fatigue. Sleeps poorly.  Is taking benadryl nightly.  Goes to bed and takes 45 minutes for sleep onset, awakes after 3 hours.  Returns to sleep and sleeps 3-4 hours sometimes awake every hour. Shoulder and hip pain wakes her up to move.  Chest muscle strain improving.  HEALTH MAINTENANCE:  Weight/BMI: increased after hysterectomy approx 30 lbs in last year Physical activity: regular walking (dog-walking service) Diet: Fast food/convenience Seatbelt: always Sunscreen: usually PAP: not due Mammogram: due HIV/HEP C: negative HIV 20 years ago Optometry: more than 2 years Dentistry: every 6 months  VACCINES: Tetanus: 05/2017 Influenza: recommended   Past Medical History:  Diagnosis Date  . Allergy    seasonal.  Takes allergy shots at [facility]  . Anemia   . Anxiety   . Arthritis   . Asthma    exercise induced--no inhalers  . Cancer ([facility]) 2010   Basal cell skin cancer Lt eye, skin graft above Lt eye  . Depression   . Dysmenorrhea   . Headache   . Hypothyroid    Nodules and Fine needle aspiration  . IBS (irritable bowel syndrome)   . Painful menstrual periods   . Plantar fasciitis   . Sprain of carpal (joint) of wrist    Past Surgical History:  Procedure Laterality Date  . ABDOMINAL HYSTERECTOMY Bilateral 07/02/2016   Procedure: HYSTERECTOMY ABDOMINAL WITH BILATERAL SALPINGECTOMY-MIDLINE INCISION;  Surgeon: Herold Harms, MD;  Location: [facility] ORS;  Service: Gynecology;  Laterality: Bilateral;  . CHOLECYSTECTOMY  2009  . HERNIA REPAIR  1974   umbilical  . SKIN GRAFT  2010  . TONSILLECTOMY     Social History   Socioeconomic History  . Marital status: Married    Spouse name: Not on file  .  Number of children: Not on file  . Years of education: Not on file  . Highest education level: Not on file  Occupational History  . Not on file  Social Needs  . Financial resource strain: Not on file  . Food insecurity:    Worry: Not on file    Inability: Not on file  . Transportation needs:    Medical: Not on file    Non-medical: Not on file  Tobacco Use  . Smoking status: Never Smoker  . Smokeless tobacco: Never Used  Substance and Sexual Activity  . Alcohol use: Yes    Comment: rare  . Drug use: No  . Sexual activity: Yes    Birth control/protection: Surgical  Lifestyle  . Physical activity:    Days per week: Not on file    Minutes per session: Not on file  . Stress: Not on file  Relationships  . Social connections:    Talks on phone: Not on file    Gets together: Not on file    Attends religious service: Not on file    Active member of club or organization: Not on file    Attends meetings of clubs or organizations: Not on file    Relationship status: Not on file  . Intimate partner violence:    Fear of current or ex partner: Not on file    Emotionally abused: Not on file  Physically abused: Not on file    Forced sexual activity: Not on file  Other Topics Concern  . Not on file  Social History Narrative  . Not on file   Family History  Problem Relation Age of Onset  . Hyperlipidemia Mother   . Hypertension Mother   . Cancer Father        lung CA  . Heart disease Maternal Grandmother   . Stroke Maternal Grandmother   . Hypertension Maternal Grandmother   . Diabetes Maternal Grandmother   . Heart disease Maternal Grandfather   . Diabetes Maternal Grandfather   . Heart attack Maternal Grandfather    Current Outpatient Medications on File Prior to Visit  Medication Sig  . albuterol (PROVENTIL HFA;VENTOLIN HFA) 108 (90 Base) MCG/ACT inhaler Inhale 2 puffs into the lungs every 6 (six) hours as needed for wheezing or shortness of breath.  . AZO-CRANBERRY PO  Take by mouth.  . baclofen (LIORESAL) 10 MG tablet Take 0.5-1 tablets (5-10 mg total) by mouth at bedtime as needed for muscle spasms.  . Cranberry-Vitamin C-Probiotic (AZO CRANBERRY PO) Take 2 tablets by mouth once.  . diphenhydrAMINE (BENADRYL ALLERGY) 25 mg capsule Take 25 mg by mouth every 6 (six) hours as needed.  Marland Kitchen EPINEPHrine 0.3 mg/0.3 mL IJ SOAJ injection 0.3 mg as needed.   Marland Kitchen estradiol (ESTRACE) 1 MG tablet Take 1 tablet (1 mg total) by mouth daily.  Marland Kitchen ibuprofen (ADVIL,MOTRIN) 400 MG tablet Take 400 mg by mouth every 6 (six) hours as needed.  Marland Kitchen ipratropium (ATROVENT) 0.06 % nasal spray Place 2 sprays into both nostrils 4 (four) times daily. For up to 5-7 days then stop.  Marland Kitchen levothyroxine (SYNTHROID, LEVOTHROID) 50 MCG tablet Take 25 mcg by mouth daily before breakfast.   . Misc Natural Products (OSTEO BI-FLEX ADV TRIPLE ST PO) Take by mouth.  . Probiotic Product (TRUBIOTICS) CAPS Take 1 capsule by mouth daily.  . S-Adenosylmethionine (SAME) 400 MG TABS Take 1 tablet by mouth.  Marland Kitchen buPROPion (WELLBUTRIN XL) 150 MG 24 hr tablet Take 1 tablet (150 mg total) by mouth daily. (Patient not taking: Reported on 10/22/2017)   No current facility-administered medications on file prior to visit.     Review of Systems  Constitutional: Negative for chills and fever.  HENT: Negative for congestion and sore throat.   Eyes: Negative for pain.  Respiratory: Negative for cough, shortness of breath and wheezing.   Cardiovascular: Negative for chest pain, palpitations and leg swelling.  Gastrointestinal: Negative for abdominal pain, blood in stool, constipation, diarrhea, nausea and vomiting.  Endocrine: Negative for polydipsia.  Genitourinary: Negative for dysuria, frequency, hematuria and urgency.  Musculoskeletal: Positive for arthralgias, back pain and myalgias. Negative for neck pain.  Skin: Negative.  Negative for rash.  Allergic/Immunologic: Negative for environmental allergies.  Neurological:  Negative for dizziness, weakness and headaches.  Hematological: Does not bruise/bleed easily.  Psychiatric/Behavioral: Negative for dysphoric mood and suicidal ideas. The patient is not nervous/anxious.    Per HPI unless specifically indicated above     Objective:    BP 132/76 (BP Location: Left Arm, Patient Position: Sitting, Cuff Size: Large)   Pulse 77   Temp 98.4 F (36.9 C) (Oral)   Ht 5\' 8"  (1.727 m)   Wt 277 lb 12.8 oz (126 kg)   LMP 05/26/2016 (Exact Date)   BMI 42.24 kg/m   Wt Readings from Last 3 Encounters:  10/22/17 277 lb 12.8 oz (126 kg)  10/07/17 276 lb 6.4 oz (  125.4 kg)  10/05/17 278 lb (126.1 kg)    Physical Exam  Constitutional: She is oriented to person, place, and time. She appears well-developed and well-nourished. No distress.  HENT:  Head: Normocephalic and atraumatic.  Right Ear: External ear normal.  Left Ear: External ear normal.  Nose: Nose normal.  Mouth/Throat: Oropharynx is clear and moist.  Eyes: Pupils are equal, round, and reactive to light. Conjunctivae are normal.  Neck: Normal range of motion. Neck supple. No JVD present. No tracheal deviation present. No thyromegaly present.  Cardiovascular: Normal rate, regular rhythm, normal heart sounds and intact distal pulses. Exam reveals no gallop and no friction rub.  No murmur heard. Pulmonary/Chest: Effort normal and breath sounds normal. No respiratory distress.  Breast - Normal exam w/ symmetric breasts, no mass, no nipple discharge, no skin changes or tenderness.  Significant fibrocystic tissue noted.   Abdominal: Soft. Bowel sounds are normal. She exhibits no distension. There is no hepatosplenomegaly. There is no tenderness.  Musculoskeletal: Normal range of motion.  Lymphadenopathy:    She has no cervical adenopathy.  Neurological: She is alert and oriented to person, place, and time. No cranial nerve deficit.  Skin: Skin is warm and dry. Capillary refill takes less than 2 seconds.    Psychiatric: She has a normal mood and affect. Her behavior is normal. Judgment and thought content normal.  Nursing note and vitals reviewed.  Results for orders placed or performed in visit on 10/07/17  T3, free  Result Value Ref Range   T3, Free 3.0 2.3 - 4.2 pg/mL  T4, free  Result Value Ref Range   Free T4 0.9 0.8 - 1.8 ng/dL  CBC with Differential  Result Value Ref Range   WBC 9.9 3.8 - 10.8 Thousand/uL   RBC 4.29 3.80 - 5.10 Million/uL   Hemoglobin 12.9 11.7 - 15.5 g/dL   HCT 23.7 62.8 - 31.5 %   MCV 90.2 80.0 - 100.0 fL   MCH 30.1 27.0 - 33.0 pg   MCHC 33.3 32.0 - 36.0 g/dL   RDW 17.6 16.0 - 73.7 %   Platelets 383 140 - 400 Thousand/uL   MPV 9.1 7.5 - 12.5 fL   Neutro Abs 5,584 1,500 - 7,800 cells/uL   Lymphs Abs 2,218 850 - 3,900 cells/uL   WBC mixed population 634 200 - 950 cells/uL   Eosinophils Absolute 1,406 (H) 15 - 500 cells/uL   Basophils Absolute 59 0 - 200 cells/uL   Neutrophils Relative % 56.4 %   Total Lymphocyte 22.4 %   Monocytes Relative 6.4 %   Eosinophils Relative 14.2 %   Basophils Relative 0.6 %      Assessment & Plan:   Problem List Items Addressed This Visit    None    Visit Diagnoses    Encounter for general adult medical examination with abnormal findings    -  Primary Physical exam with no new exam findings, but with worsening insomnia (see below).    Plan: 1. Obtain health maintenance screenings as above according to age. - Increase physical activity to 30 minutes most days of the week.  - Eat healthy diet high in vegetables and fruits; low in refined carbohydrates.  Provided low glycemic diet handout. - Encourage weight loss of 25-35 lbs in next 1 year.  Encouraged food logging.   2. Return 1 year for annual physical.    Relevant Orders   MM DIGITAL SCREENING BILATERAL   COMPLETE METABOLIC PANEL WITH GFR   Hemoglobin  A1c   Lipid panel   TSH   VITAMIN D 25 Hydroxy (Vit-D Deficiency, Fractures)   Musculoskeletal chest pain      Improving.  Continue baclofen 5-10 mg at bedtime.   Relevant Medications   baclofen (LIORESAL) 10 MG tablet   Breast cancer screening     Pt last mammogram 2-3 years ago.  Result negative. Fibrocystic breast tissue.  Plan: 1. Screening mammogram order placed.  Pt will call to schedule appointment.  Information given.   Relevant Orders   MM DIGITAL SCREENING BILATERAL   Primary insomnia     Patient with insomnia and difficulty with sleep maintenance.  Also has difficulty with seep onset.  Benadryl for sleep minimally effective.  Has not yet tried melatonin. - START melatonin 3-6 mg at bedtime for 2 weeks. - Consider sleep medicine referral vs other medications at future visit. - Encouraged good sleep hygiene. - Followup 3-6 months.       Meds ordered this encounter  Medications  . baclofen (LIORESAL) 10 MG tablet    Sig: Take 0.5-1 tablets (5-10 mg total) by mouth at bedtime as needed for muscle spasms.    Dispense:  30 each    Refill:  2    Order Specific Question:   Supervising Provider    Answer:   Smitty Cords [[id number]]    Follow up plan: Return in about 1 year (around 10/23/2018) for annual physical.  Wilhelmina Mcardle, DNP, AGPCNP-BC Adult Gerontology Primary Care Nurse Practitioner [facility] 10/22/2017, 2:04 PM

## 2017-10-23 ENCOUNTER — Other Ambulatory Visit: Payer: PRIVATE HEALTH INSURANCE

## 2017-10-24 LAB — LIPID PANEL
Cholesterol: 193 mg/dL (ref <200)
HDL: 40 mg/dL — ABNORMAL LOW (ref >50)
LDL Cholesterol (Calc): 128 mg/dL (calc) — ABNORMAL HIGH
Non-HDL Cholesterol (Calc): 153 mg/dL (calc) — ABNORMAL HIGH (ref <130)
Total CHOL/HDL Ratio: 4.8 (calc) (ref <5.0)
Triglycerides: 133 mg/dL (ref <150)

## 2017-10-24 LAB — TSH: TSH: 0.06 mIU/L — ABNORMAL LOW

## 2017-10-24 LAB — COMPLETE METABOLIC PANEL WITH GFR
AG Ratio: 1.7 (calc) (ref 1.0–2.5)
ALT: 11 U/L (ref 6–29)
AST: 10 U/L (ref 10–30)
Albumin: 4 g/dL (ref 3.6–5.1)
Alkaline phosphatase (APISO): 45 U/L (ref 33–115)
BUN: 12 mg/dL (ref 7–25)
CO2: 30 mmol/L (ref 20–32)
Calcium: 8.9 mg/dL (ref 8.6–10.2)
Chloride: 104 mmol/L (ref 98–110)
Creat: 0.69 mg/dL (ref 0.50–1.10)
GFR, Est African American: 123 mL/min/{1.73_m2} (ref >=60)
GFR, Est Non African American: 106 mL/min/{1.73_m2} (ref >=60)
Globulin: 2.4 g/dL (calc) (ref 1.9–3.7)
Glucose, Bld: 88 mg/dL (ref 65–99)
Potassium: 4.5 mmol/L (ref 3.5–5.3)
Sodium: 139 mmol/L (ref 135–146)
Total Bilirubin: 0.4 mg/dL (ref 0.2–1.2)
Total Protein: 6.4 g/dL (ref 6.1–8.1)

## 2017-10-24 LAB — HEMOGLOBIN A1C
Hgb A1c MFr Bld: 5.3 % of total Hgb (ref <5.7)
Mean Plasma Glucose: 105 (calc)
eAG (mmol/L): 5.8 (calc)

## 2017-10-24 LAB — VITAMIN D 25 HYDROXY (VIT D DEFICIENCY, FRACTURES): Vit D, 25-Hydroxy: 25 ng/mL — ABNORMAL LOW (ref 30–100)

## 2017-10-31 ENCOUNTER — Ambulatory Visit: Payer: PRIVATE HEALTH INSURANCE | Attending: Nurse Practitioner

## 2017-10-31 DIAGNOSIS — M791 Myalgia, unspecified site: Secondary | ICD-10-CM | POA: Insufficient documentation

## 2017-10-31 DIAGNOSIS — R278 Other lack of coordination: Secondary | ICD-10-CM | POA: Diagnosis present

## 2017-10-31 DIAGNOSIS — M62838 Other muscle spasm: Secondary | ICD-10-CM | POA: Diagnosis not present

## 2017-10-31 DIAGNOSIS — M6281 Muscle weakness (generalized): Secondary | ICD-10-CM | POA: Diagnosis present

## 2017-10-31 NOTE — Therapy (Signed)
[place] MAIN [facility] SERVICES [address] [place], [place], [address] Phone: [phone]   Fax:  [phone]  Physical Therapy Treatment  Patient Details  Name: Caitlin Barrett MRN: [id number] Date of Birth: 1970/06/02 Referring Provider: Cassell Smiles   Encounter Date: 10/31/2017  PT End of Session - 11/04/17 1641    Visit Number  18    Number of Visits  19    Date for PT Re-Evaluation  11/04/17    PT Start Time  1630    PT Stop Time  1730    PT Time Calculation (min)  60 min    Activity Tolerance  Patient tolerated treatment well;No increased pain    Behavior During Therapy  WFL for tasks assessed/performed       Past Medical History:  Diagnosis Date  . Allergy    seasonal.  Takes allergy shots at [facility]  . Anemia   . Anxiety   . Arthritis   . Asthma    exercise induced--no inhalers  . Cancer ([place]) 2010   Basal cell skin cancer Lt eye, skin graft above Lt eye  . Depression   . Dysmenorrhea   . Headache   . Hypothyroid    Nodules and Fine needle aspiration  . IBS (irritable bowel syndrome)   . Painful menstrual periods   . Plantar fasciitis   . Sprain of carpal (joint) of wrist     Past Surgical History:  Procedure Laterality Date  . ABDOMINAL HYSTERECTOMY Bilateral 07/02/2016   Procedure: HYSTERECTOMY ABDOMINAL WITH BILATERAL SALPINGECTOMY-MIDLINE INCISION;  Surgeon: Brayton Mars, MD;  Location: [facility] ORS;  Service: Gynecology;  Laterality: Bilateral;  . CHOLECYSTECTOMY  2009  . HERNIA REPAIR  [id number]   umbilical  . SKIN GRAFT  2010  . TONSILLECTOMY      There were no vitals filed for this visit.    Pelvic Floor Physical Therapy Treatment Note  SCREENING  Changes in medications, allergies, or medical history?: no    SUBJECTIVE  Patient reports: She tried having intercourse twice on Sunday, the first time was easier and that night it was more uncomfortable with deep thrusting. Her chest has been  doing better, does not hurt all hte time but she had two dogs pull her the other day and thinks this may have aggravated it. Has had a harder time doing kegels since intercourse and it has spurred an increase in urinary urge.  Pain update:  Location of pain: chest and shoulder Current pain:  3/10  Max pain:  3/10 Least pain:  0/10 Nature of pain: sharp  Patient Goals: No Pain with Intercourse, maintaining improvement in urinary symptoms while increasing water intake to decrease constipation. No return of pelvic pain.   OBJECTIVE  Changes in: Posture/Observations:  Mild hyper kyphosis/lordosis ( ~ 75% improved)  Palpation: TTP to B lumbar paraspinals, Lumbar multifidus, and QL   INTERVENTIONS THIS SESSION: Manual: performed STM and TP release to B lumbar paraspinals and QL to decrease pain and spasm and relieve tension acting on lumbar nerve roots to allow for improved TA and PFM function to decrease recurrent spasms in the pelvic floor. Dry needling: Performed standard approach TPDN to B lumbar paraspinals, Lumbar multifidus, and QL to decrease pain and spasm and relieve tension acting on lumbar nerve roots to allow for improved TA and PFM function to decrease recurrent spasms in the pelvic floor. Therex: reviewed Mini-marches for form and reminded Pt. To use seated low back stretch  Plan  - 11/04/17 1642    Clinical Impression Statement  Pt. continues to make significant progress with pelvic pain and then have set-backs where intercourse becomes painful again regardless of diligent HEP performeance and implementation of reccommedations. It appears that the remaining cause of her spasms is now the spasms in her back that are putting pressure on the lumbar nerve roots as they course toward the pelvis. Today's session focused on decreasing these spasms to allow for improved long-term relaxation of the PFM. We will continue to address this problem on a less frequent basis now that the Pt has most of the education needed to make between-session progress and we will focus on further deep core stability to allow both the low back and PFM to remain spasm-free  long-term. We will continue at every-other-week for 6 more visits.     Clinical Presentation  Stable    Clinical Decision Making  Moderate    Rehab Potential  Good    Clinical Impairments Affecting Rehab Potential  Hypothyroidism, depression, early menopause, obesity.    PT Frequency  Other (comment)   every-other week   PT Duration  12 weeks    PT Treatment/Interventions  ADLs/Self Care Home Management;Biofeedback;Aquatic Therapy;Electrical Stimulation;Moist Heat;Traction;Functional mobility training;Therapeutic activities;Therapeutic exercise;Balance training;Neuromuscular re-education;Manual techniques;Patient/family education;Dry needling;Taping    PT Next Visit Plan  re-assess lumbar muscles and scaral mobility, re-assess PFM PRN. review and upgrade core exercises PRN    PT Home Exercise Plan  diaphragmatic breathing, adductor stretch, child's pose, lengthening kegels (3 sec long-holds), sit-to-stand, squat form, log-roll, soda-can, squatty potty , TA in quadruped, pelvic tilts on ball, shoulder retractions, chin-tucks, rows, towel-roll extensions., bird-dog arms    Consulted and Agree with Plan of Care  Patient       Patient will  benefit from skilled therapeutic intervention in order to improve the following deficits and impairments:  Increased fascial restricitons, Improper body mechanics, Pain, Decreased coordination, Decreased mobility, Decreased scar mobility, Increased muscle spasms, Postural dysfunction, Decreased activity tolerance, Decreased endurance, Decreased range of motion, Decreased strength, Decreased balance, Obesity  Visit Diagnosis: Other muscle spasm  Other lack of coordination  Muscle weakness (generalized)  Myalgia     Problem List Patient Active Problem List   Diagnosis Date Noted  . Monilial vaginitis 02/26/2017  . Vaginal dryness 02/26/2017  . Decreased libido 02/26/2017  . Anxiety with depression 02/26/2017  . Dyspareunia, female 02/26/2017  . S/P TAH (total abdominal hysterectomy) 07/02/2016  . 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 Nocturia 07/21/2015  . Stress incontinence 07/21/2015  . 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 Allergic rhinitis 06/21/2015  . Obesity (BMI 30-39.9) 06/21/2015   Willa Rough DPT, ATC Willa Rough 11/04/2017, 4:55 PM  [place] MAIN [facility] SERVICES [address] [place], [place], [address] Phone: [phone]   Fax:  [phone]  Name: Caitlin Barrett MRN: [id number] Date of Birth: 12-27-1970  Plan  - 11/04/17 1642    Clinical Impression Statement  Pt. continues to make significant progress with pelvic pain and then have set-backs where intercourse becomes painful again regardless of diligent HEP performeance and implementation of reccommedations. It appears that the remaining cause of her spasms is now the spasms in her back that are putting pressure on the lumbar nerve roots as they course toward the pelvis. Today's session focused on decreasing these spasms to allow for improved long-term relaxation of the PFM. We will continue to address this problem on a less frequent basis now that the Pt has most of the education needed to make between-session progress and we will focus on further deep core stability to allow both the low back and PFM to remain spasm-free  long-term. We will continue at every-other-week for 6 more visits.     Clinical Presentation  Stable    Clinical Decision Making  Moderate    Rehab Potential  Good    Clinical Impairments Affecting Rehab Potential  Hypothyroidism, depression, early menopause, obesity.    PT Frequency  Other (comment)   every-other week   PT Duration  12 weeks    PT Treatment/Interventions  ADLs/Self Care Home Management;Biofeedback;Aquatic Therapy;Electrical Stimulation;Moist Heat;Traction;Functional mobility training;Therapeutic activities;Therapeutic exercise;Balance training;Neuromuscular re-education;Manual techniques;Patient/family education;Dry needling;Taping    PT Next Visit Plan  re-assess lumbar muscles and scaral mobility, re-assess PFM PRN. review and upgrade core exercises PRN    PT Home Exercise Plan  diaphragmatic breathing, adductor stretch, child's pose, lengthening kegels (3 sec long-holds), sit-to-stand, squat form, log-roll, soda-can, squatty potty , TA in quadruped, pelvic tilts on ball, shoulder retractions, chin-tucks, rows, towel-roll extensions., bird-dog arms    Consulted and Agree with Plan of Care  Patient       Patient will  benefit from skilled therapeutic intervention in order to improve the following deficits and impairments:  Increased fascial restricitons, Improper body mechanics, Pain, Decreased coordination, Decreased mobility, Decreased scar mobility, Increased muscle spasms, Postural dysfunction, Decreased activity tolerance, Decreased endurance, Decreased range of motion, Decreased strength, Decreased balance, Obesity  Visit Diagnosis: Other muscle spasm  Other lack of coordination  Muscle weakness (generalized)  Myalgia     Problem List Patient Active Problem List   Diagnosis Date Noted  . Monilial vaginitis 02/26/2017  . Vaginal dryness 02/26/2017  . Decreased libido 02/26/2017  . Anxiety with depression 02/26/2017  . Dyspareunia, female 02/26/2017  . S/P TAH (total abdominal hysterectomy) 07/02/2016  . 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 Nocturia 07/21/2015  . Stress incontinence 07/21/2015  . 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 Allergic rhinitis 06/21/2015  . Obesity (BMI 30-39.9) 06/21/2015   Willa Rough DPT, ATC Willa Rough 11/04/2017, 4:55 PM  [place] MAIN [facility] SERVICES [address] [place], [place], [address] Phone: [phone]   Fax:  [phone]  Name: Caitlin Barrett MRN: [id number] Date of Birth: 12-27-1970

## 2017-11-07 ENCOUNTER — Ambulatory Visit: Payer: PRIVATE HEALTH INSURANCE

## 2017-11-14 ENCOUNTER — Ambulatory Visit: Payer: PRIVATE HEALTH INSURANCE

## 2017-11-14 DIAGNOSIS — M6281 Muscle weakness (generalized): Secondary | ICD-10-CM

## 2017-11-14 DIAGNOSIS — R278 Other lack of coordination: Secondary | ICD-10-CM

## 2017-11-14 DIAGNOSIS — M62838 Other muscle spasm: Secondary | ICD-10-CM

## 2017-11-14 DIAGNOSIS — M791 Myalgia, unspecified site: Secondary | ICD-10-CM

## 2017-11-14 NOTE — Patient Instructions (Signed)
   Hold for 5 breaths and repeat 2-3 times on each side.

## 2017-11-14 NOTE — Therapy (Signed)
[place] MAIN [facility] SERVICES [address] [place], [place], [address] Phone: [phone]   Fax:  [phone]  Physical Therapy Treatment  Patient Details  Name: Caitlin Barrett MRN: [id number] Date of Birth: 1970-04-25 Referring Provider: Cassell Smiles   Encounter Date: 11/14/2017  PT End of Session - 11/15/17 1232    Visit Number  19    Number of Visits  25    Date for PT Re-Evaluation  01/27/18    PT Start Time  1430    PT Stop Time  1530    PT Time Calculation (min)  60 min    Activity Tolerance  Patient tolerated treatment well;No increased pain    Behavior During Therapy  WFL for tasks assessed/performed       Past Medical History:  Diagnosis Date  . Allergy    seasonal.  Takes allergy shots at [facility]  . Anemia   . Anxiety   . Arthritis   . Asthma    exercise induced--no inhalers  . Cancer ([place]) 2010   Basal cell skin cancer Lt eye, skin graft above Lt eye  . Depression   . Dysmenorrhea   . Headache   . Hypothyroid    Nodules and Fine needle aspiration  . IBS (irritable bowel syndrome)   . Painful menstrual periods   . Plantar fasciitis   . Sprain of carpal (joint) of wrist     Past Surgical History:  Procedure Laterality Date  . ABDOMINAL HYSTERECTOMY Bilateral 07/02/2016   Procedure: HYSTERECTOMY ABDOMINAL WITH BILATERAL SALPINGECTOMY-MIDLINE INCISION;  Surgeon: Brayton Mars, MD;  Location: [facility] ORS;  Service: Gynecology;  Laterality: Bilateral;  . CHOLECYSTECTOMY  2009  . HERNIA REPAIR  [id number]   umbilical  . SKIN GRAFT  2010  . TONSILLECTOMY      There were no vitals filed for this visit.    Pelvic Floor Physical Therapy Treatment Note  SCREENING  Changes in medications, allergies, or medical history?: no    SUBJECTIVE  Patient reports: Back sore for a couple days then had little to no pain for a few days and has started getting sore again over the last few days and is radiating into  the low back. When doing pelvic tilts and low back stretch she notices an easing of the pain but it comes back after. A little crampyness with foreplay but not with intercourse or after feels like it could have been because her back was acting up, felt tightness in L inner thigh. Did use tool and thumb for self internal release.  Pain update:  Location of pain: R low back and hip Current pain:  3/10  Max pain:  5/10 Least pain:  1/10 Nature of pain: dull ache with intermittent shooting  Patient Goals: No Pain with Intercourse, maintaining improvement in urinary symptoms while increasing water intake to decrease constipation. No return of pelvic pain.   OBJECTIVE  Changes in:  Palpation: TTP to R Lumbar Multifidus, paraspinals, QL, Piriformis, Glute Min and Max.  INTERVENTIONS THIS SESSION: Manual: Performed TP release and STM to R Lumbar Multifidus, paraspinals, QL, Piriformis, Glute Min and Max to decrease pain and spasm and allow for improved pelvic position and deep-core recruitment to keep pressure off of lumbosacral nerve roots.  Dry needle: perormed standard approach TPDN with a .30x32m needle to R Lumbar Multifidus, paraspinals, QL, Piriformis, Glute Min and Max to decrease pain and spasm and allow for improved pelvic position and deep-core recruitment to  Title  Patient will report no pain with intercourse to demonstrate improved functional ability.    Time  12    Period  Weeks    Status  On-going    Target Date  01/27/18      PT LONG TERM GOAL #3   Title  Patient will score at or below 64 on the PFDI and 8/43 on the Female NIH-CPSI to demonstrate a clinically meaningful decrease in disability and distress due to pelvic floor dysfunction.    Baseline  NIH-CPSI:13/43, PFDI: 44/300 (4/30)    Time  12    Period  Weeks    Status  On-going    Target Date  01/27/18            Plan - 11/15/17 1234    Clinical Impression Statement  Pt. responded well to all interventions today, demonstrating decreased pain and spasm on R side as well as understanding of all education provided. Continue per POC.    Clinical Presentation  Stable    Clinical Decision Making  Moderate    Rehab Potential  Good    Clinical Impairments Affecting Rehab Potential  Hypothyroidism, depression, early menopause, obesity.    PT Frequency  Other (comment)   every-other-week   PT Duration  12 weeks    PT Treatment/Interventions  ADLs/Self Care Home Management;Biofeedback;Aquatic Therapy;Electrical Stimulation;Moist Heat;Traction;Functional mobility training;Therapeutic activities;Therapeutic exercise;Balance training;Neuromuscular re-education;Manual techniques;Patient/family education;Dry needling;Taping    PT Next Visit Plan  re-assess lumbar muscles and PFM PRN. review and upgrade core exercises     PT Home Exercise Plan  diaphragmatic breathing, adductor stretch, child's pose, lengthening kegels (3 sec long-holds), sit-to-stand, squat form, log-roll, soda-can, squatty potty , TA in  quadruped, pelvic tilts on ball, shoulder retractions, chin-tucks, rows, towel-roll extensions., bird-dog arms    Consulted and Agree with Plan of Care  Patient       Patient will benefit from skilled therapeutic intervention in order to improve the following deficits and impairments:  Increased fascial restricitons, Improper body mechanics, Pain, Decreased coordination, Decreased mobility, Decreased scar mobility, Increased muscle spasms, Postural dysfunction, Decreased activity tolerance, Decreased endurance, Decreased range of motion, Decreased strength, Decreased balance, Obesity  Visit Diagnosis: Other muscle spasm  Other lack of coordination  Muscle weakness (generalized)  Myalgia     Problem List Patient Active Problem List   Diagnosis Date Noted  . Monilial vaginitis 02/26/2017  . Vaginal dryness 02/26/2017  . Decreased libido 02/26/2017  . Anxiety with depression 02/26/2017  . Dyspareunia, female 02/26/2017  . S/P TAH (total abdominal hysterectomy) 07/02/2016  . 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 Nocturia 07/21/2015  . Stress incontinence 07/21/2015  . 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 Allergic rhinitis 06/21/2015  . Obesity (BMI 30-39.9) 06/21/2015   Willa Rough DPT, ATC Willa Rough 11/15/2017, 12:43 PM  [place] MAIN [facility] SERVICES [address] [place], [place], [address] Phone: [phone]   Fax:  [phone]  Name: Caitlin Barrett MRN: [id number] Date of Birth: 10-26-1970  Title  Patient will report no pain with intercourse to demonstrate improved functional ability.    Time  12    Period  Weeks    Status  On-going    Target Date  01/27/18      PT LONG TERM GOAL #3   Title  Patient will score at or below 64 on the PFDI and 8/43 on the Female NIH-CPSI to demonstrate a clinically meaningful decrease in disability and distress due to pelvic floor dysfunction.    Baseline  NIH-CPSI:13/43, PFDI: 44/300 (4/30)    Time  12    Period  Weeks    Status  On-going    Target Date  01/27/18            Plan - 11/15/17 1234    Clinical Impression Statement  Pt. responded well to all interventions today, demonstrating decreased pain and spasm on R side as well as understanding of all education provided. Continue per POC.    Clinical Presentation  Stable    Clinical Decision Making  Moderate    Rehab Potential  Good    Clinical Impairments Affecting Rehab Potential  Hypothyroidism, depression, early menopause, obesity.    PT Frequency  Other (comment)   every-other-week   PT Duration  12 weeks    PT Treatment/Interventions  ADLs/Self Care Home Management;Biofeedback;Aquatic Therapy;Electrical Stimulation;Moist Heat;Traction;Functional mobility training;Therapeutic activities;Therapeutic exercise;Balance training;Neuromuscular re-education;Manual techniques;Patient/family education;Dry needling;Taping    PT Next Visit Plan  re-assess lumbar muscles and PFM PRN. review and upgrade core exercises     PT Home Exercise Plan  diaphragmatic breathing, adductor stretch, child's pose, lengthening kegels (3 sec long-holds), sit-to-stand, squat form, log-roll, soda-can, squatty potty , TA in  quadruped, pelvic tilts on ball, shoulder retractions, chin-tucks, rows, towel-roll extensions., bird-dog arms    Consulted and Agree with Plan of Care  Patient       Patient will benefit from skilled therapeutic intervention in order to improve the following deficits and impairments:  Increased fascial restricitons, Improper body mechanics, Pain, Decreased coordination, Decreased mobility, Decreased scar mobility, Increased muscle spasms, Postural dysfunction, Decreased activity tolerance, Decreased endurance, Decreased range of motion, Decreased strength, Decreased balance, Obesity  Visit Diagnosis: Other muscle spasm  Other lack of coordination  Muscle weakness (generalized)  Myalgia     Problem List Patient Active Problem List   Diagnosis Date Noted  . Monilial vaginitis 02/26/2017  . Vaginal dryness 02/26/2017  . Decreased libido 02/26/2017  . Anxiety with depression 02/26/2017  . Dyspareunia, female 02/26/2017  . S/P TAH (total abdominal hysterectomy) 07/02/2016  . 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 Nocturia 07/21/2015  . Stress incontinence 07/21/2015  . 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 Allergic rhinitis 06/21/2015  . Obesity (BMI 30-39.9) 06/21/2015   Willa Rough DPT, ATC Willa Rough 11/15/2017, 12:43 PM  [place] MAIN [facility] SERVICES [address] [place], [place], [address] Phone: [phone]   Fax:  [phone]  Name: Caitlin Barrett MRN: [id number] Date of Birth: 10-26-1970

## 2017-11-26 ENCOUNTER — Encounter: Payer: Self-pay | Admitting: Nurse Practitioner

## 2017-11-26 DIAGNOSIS — B373 Candidiasis of vulva and vagina: Secondary | ICD-10-CM

## 2017-11-26 DIAGNOSIS — B3731 Acute candidiasis of vulva and vagina: Secondary | ICD-10-CM

## 2017-11-26 DIAGNOSIS — Z6841 Body Mass Index (BMI) 40.0 and over, adult: Secondary | ICD-10-CM

## 2017-11-27 MED ORDER — FLUCONAZOLE 150 MG PO TABS: 150.0000 mg | ORAL_TABLET | Freq: Once | ORAL | 0 refills | Status: AC

## 2017-11-28 ENCOUNTER — Ambulatory Visit: Payer: PRIVATE HEALTH INSURANCE | Attending: Nurse Practitioner

## 2017-11-28 DIAGNOSIS — M791 Myalgia, unspecified site: Secondary | ICD-10-CM | POA: Insufficient documentation

## 2017-11-28 DIAGNOSIS — R278 Other lack of coordination: Secondary | ICD-10-CM | POA: Diagnosis present

## 2017-11-28 DIAGNOSIS — M62838 Other muscle spasm: Secondary | ICD-10-CM | POA: Diagnosis not present

## 2017-11-28 DIAGNOSIS — M6281 Muscle weakness (generalized): Secondary | ICD-10-CM | POA: Insufficient documentation

## 2017-11-28 NOTE — Therapy (Signed)
Hypothyroidism, depression, early menopause, obesity.    PT Frequency  Other (comment)   every-other week   PT Duration  12 weeks    PT Treatment/Interventions  ADLs/Self Care Home Management;Biofeedback;Aquatic Therapy;Electrical Stimulation;Moist Heat;Traction;Functional mobility training;Therapeutic activities;Therapeutic exercise;Balance training;Neuromuscular re-education;Manual techniques;Patient/family education;Dry needling;Taping    PT Next Visit Plan  re-assess lumbar muscles and PFM PRN. review and upgrade core exercises     PT Home Exercise Plan  diaphragmatic breathing, adductor stretch, child's pose, lengthening kegels (3 sec long-holds), sit-to-stand, squat form, log-roll, soda-can, squatty potty , TA in quadruped, pelvic tilts on ball, shoulder retractions, chin-tucks, rows, towel-roll extensions., bird-dog arms, seated low back stretch    Consulted and Agree with Plan of Care  Patient       Patient will benefit from skilled therapeutic intervention in order to improve the following deficits and impairments:  Increased fascial restricitons, Improper body mechanics, Pain, Decreased coordination, Decreased mobility, Decreased scar mobility, Increased muscle spasms, Postural dysfunction, Decreased activity tolerance, Decreased endurance, Decreased range of motion, Decreased strength, Decreased balance, Obesity  Visit Diagnosis: Other muscle spasm  Other lack of coordination  Muscle weakness (generalized)  Myalgia     Problem List Patient Active Problem List   Diagnosis Date Noted  . Monilial vaginitis 02/26/2017  . Vaginal dryness 02/26/2017  . Decreased libido 02/26/2017  . Anxiety with depression 02/26/2017  . Dyspareunia, female 02/26/2017  . S/P TAH (total abdominal hysterectomy) 07/02/2016  . 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 Nocturia 07/21/2015  . Stress incontinence 07/21/2015  . 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 Allergic rhinitis 06/21/2015  . Obesity (BMI 30-39.9) 06/21/2015   Willa Rough DPT, ATC Willa Rough 12/02/2017, 1:01 PM  [place] [facility]IN [facility] SERVICES [address] [place], [place], [address] Phone: [phone]   Fax:  [phone]  Name: Caitlin Barrett MRN: [id number] Date of Birth: [birth year]  [place] [facility]IN [facility] SERVICES [address] [place], [place], [address] Phone: [phone]   Fax:  [phone]  Physical Therapy Treatment  Patient Details  Name: Caitlin Barrett MRN: [id number] Date of Birth: August 26, 1970 Referring Provider: Cassell Smiles   Encounter Date: 11/28/2017  PT End of Session - 12/02/17 1259    Visit Number  20    Number of Visits  25    Date for PT Re-Evaluation  01/27/18    PT Start Time  1640    PT Stop Time  9622    PT Time Calculation (min)  50 min    Activity Tolerance  Patient tolerated treatment well;No increased pain    Behavior During Therapy  WFL for tasks assessed/performed       Past Medical History:  Diagnosis Date  . Allergy    seasonal.  Takes allergy shots at [facility]  . Anemia   . Anxiety   . Arthritis   . Asthma    exercise induced--no inhalers  . Cancer ([place]) 2010   Basal cell skin cancer Lt eye, skin graft above Lt eye  . Depression   . Dysmenorrhea   . Headache   . Hypothyroid    Nodules and Fine needle aspiration  . IBS (irritable bowel syndrome)   . Painful menstrual periods   . Plantar fasciitis   . Sprain of carpal (joint) of wrist     Past Surgical History:  Procedure Laterality Date  . ABDOMINAL HYSTERECTOMY Bilateral 07/02/2016   Procedure: HYSTERECTOMY ABDOMINAL WITH BILATERAL SALPINGECTOMY-MIDLINE INCISION;  Surgeon: Brayton Mars, MD;  Location: [facility] ORS;  Service: Gynecology;  Laterality: Bilateral;  . CHOLECYSTECTOMY  2009  . HERNIA REPAIR  2979   umbilical  . SKIN GRAFT  2010  . TONSILLECTOMY      There were no vitals filed for this visit.    Pelvic Floor Physical Therapy Treatment Note  SCREENING  Changes in medications, allergies, or medical history?: no    SUBJECTIVE  Patient reports: Has been feeling sharp surges of pain from her low back into the pelvis R>L. Has been walking for exercise more. No attempt at intercourse. Has not found  any terribly painful areas, but treated some areas that were moderately sore, is doing thumb work regularly as well.   Pain update:  Location of pain: low and mid back Current pain:  1-2/10  Max pain:  3/10 Least pain:  1/10 Nature of pain:sharp surges, dull ache   Patient Goals: No Pain with Intercourse, maintaining improvement in urinary symptoms while increasing water intake to decrease constipation. No return of pelvic pain.    OBJECTIVE  Changes in: Posture/Observations:  Anterior pelvic tilt.  Palpation: TTP to B Iliacus, Psoas, and Pectineus.   INTERVENTIONS THIS SESSION: Manual: Performed STM and TP release to B Iliacus, Psoas, and Pectineus to decrease spasm and pull on pelvis into anterior pelvic tilt to decrease back pain and allow for decreased pressure on nerve roots to prevent return of PFM spasms. Dry needle: Performed standard approach TPDN with .76m needle to B Iliacus, Psoas, and Pectineus to decrease spasm and pull on pelvis into anterior pelvic tilt to decrease back pain and allow for decreased pressure on nerve roots to prevent return of PFM spasms. Therex: Educated on and practiced seated low back stretch to decrease tension on low back and allow for continued relief of LBP and PFM spasms.  Total time: 50 min  [place] MAIN [facility] SERVICES [address] [place], [place], [address] Phone: [phone]   Fax:  [phone]  Physical Therapy Treatment  Patient Details  Name: Caitlin Barrett MRN: [id number] Date of Birth: August 26, 1970 Referring Provider: Cassell Smiles   Encounter Date: 11/28/2017  PT End of Session - 12/02/17 1259    Visit Number  20    Number of Visits  25    Date for PT Re-Evaluation  01/27/18    PT Start Time  1640    PT Stop Time  9622    PT Time Calculation (min)  50 min    Activity Tolerance  Patient tolerated treatment well;No increased pain    Behavior During Therapy  WFL for tasks assessed/performed       Past Medical History:  Diagnosis Date  . Allergy    seasonal.  Takes allergy shots at [facility]  . Anemia   . Anxiety   . Arthritis   . Asthma    exercise induced--no inhalers  . Cancer ([place]) 2010   Basal cell skin cancer Lt eye, skin graft above Lt eye  . Depression   . Dysmenorrhea   . Headache   . Hypothyroid    Nodules and Fine needle aspiration  . IBS (irritable bowel syndrome)   . Painful menstrual periods   . Plantar fasciitis   . Sprain of carpal (joint) of wrist     Past Surgical History:  Procedure Laterality Date  . ABDOMINAL HYSTERECTOMY Bilateral 07/02/2016   Procedure: HYSTERECTOMY ABDOMINAL WITH BILATERAL SALPINGECTOMY-MIDLINE INCISION;  Surgeon: Brayton Mars, MD;  Location: [facility] ORS;  Service: Gynecology;  Laterality: Bilateral;  . CHOLECYSTECTOMY  2009  . HERNIA REPAIR  2979   umbilical  . SKIN GRAFT  2010  . TONSILLECTOMY      There were no vitals filed for this visit.    Pelvic Floor Physical Therapy Treatment Note  SCREENING  Changes in medications, allergies, or medical history?: no    SUBJECTIVE  Patient reports: Has been feeling sharp surges of pain from her low back into the pelvis R>L. Has been walking for exercise more. No attempt at intercourse. Has not found  any terribly painful areas, but treated some areas that were moderately sore, is doing thumb work regularly as well.   Pain update:  Location of pain: low and mid back Current pain:  1-2/10  Max pain:  3/10 Least pain:  1/10 Nature of pain:sharp surges, dull ache   Patient Goals: No Pain with Intercourse, maintaining improvement in urinary symptoms while increasing water intake to decrease constipation. No return of pelvic pain.    OBJECTIVE  Changes in: Posture/Observations:  Anterior pelvic tilt.  Palpation: TTP to B Iliacus, Psoas, and Pectineus.   INTERVENTIONS THIS SESSION: Manual: Performed STM and TP release to B Iliacus, Psoas, and Pectineus to decrease spasm and pull on pelvis into anterior pelvic tilt to decrease back pain and allow for decreased pressure on nerve roots to prevent return of PFM spasms. Dry needle: Performed standard approach TPDN with .76m needle to B Iliacus, Psoas, and Pectineus to decrease spasm and pull on pelvis into anterior pelvic tilt to decrease back pain and allow for decreased pressure on nerve roots to prevent return of PFM spasms. Therex: Educated on and practiced seated low back stretch to decrease tension on low back and allow for continued relief of LBP and PFM spasms.  Total time: 50 min

## 2017-11-28 NOTE — Patient Instructions (Signed)
   Hold for 5 deep breaths, repeat 2-3 times, once a day.

## 2017-12-03 ENCOUNTER — Other Ambulatory Visit: Payer: Self-pay

## 2017-12-03 ENCOUNTER — Ambulatory Visit: Payer: PRIVATE HEALTH INSURANCE | Admitting: Nurse Practitioner

## 2017-12-03 ENCOUNTER — Encounter: Payer: Self-pay | Admitting: Nurse Practitioner

## 2017-12-03 VITALS — BP 119/69 | HR 71 | Temp 98.5°F | Ht 68.0 in | Wt 276.8 lb

## 2017-12-03 DIAGNOSIS — B373 Candidiasis of vulva and vagina: Secondary | ICD-10-CM | POA: Diagnosis not present

## 2017-12-03 DIAGNOSIS — B3731 Acute candidiasis of vulva and vagina: Secondary | ICD-10-CM

## 2017-12-03 LAB — POCT WET PREP (WET MOUNT)
Clue Cells Wet Prep Whiff POC: NEGATIVE
Trichomonas Wet Prep HPF POC: ABSENT

## 2017-12-03 MED ORDER — FLUCONAZOLE 150 MG PO TABS: ORAL_TABLET | ORAL | 0 refills | Status: DC

## 2017-12-03 NOTE — Patient Instructions (Addendum)
Caitlin Barrett,   Thank you for coming in to clinic today.  1. You have vaginal yeast infection. Repeat diflucan 150 mg tablet today and again in 3 days if symptoms continue.  2. Consider vaginal moisturizers.  Coconut oil as you have mentioned is a good option.   Please schedule a follow-up appointment with Cassell Smiles, AGNP. Return 6-8 days if symptoms worsen or fail to improve.  If you have any other questions or concerns, please feel free to call the clinic or send a message through [facility]. You may also schedule an earlier appointment if necessary.  You will receive a survey after today's visit either digitally by e-mail or paper by [employer]. Your experiences and feedback matter to [place].  Please respond so we know how we are doing as we provide care for you.   Cassell Smiles, DNP, AGNP-BC Adult Gerontology Nurse Practitioner [facility], [facility]    Vaginal Yeast infection, Adult Vaginal yeast infection is a condition that causes soreness, swelling, and redness (inflammation) of the vagina. It also causes vaginal discharge. This is a common condition. Some women get this infection frequently. What are the causes? This condition is caused by a change in the normal balance of the yeast (candida) and bacteria that live in the vagina. This change causes an overgrowth of yeast, which causes the inflammation. What increases the risk? This condition is more likely to develop in:  Women who take antibiotic medicines.  Women who have diabetes.  Women who take birth control pills.  Women who are pregnant.  Women who douche often.  Women who have a weak defense (immune) system.  Women who have been taking steroid medicines for a long time.  Women who frequently wear tight clothing.  What are the signs or symptoms? Symptoms of this condition include:  White, thick vaginal discharge.  Swelling, itching, redness, and irritation of the vagina. The lips of the  vagina (vulva) may be affected as well.  Pain or a burning feeling while urinating.  Pain during sex.  How is this diagnosed? This condition is diagnosed with a medical history and physical exam. This will include a pelvic exam. Your health care provider will examine a sample of your vaginal discharge under a microscope. Your health care provider may send this sample for testing to confirm the diagnosis. How is this treated? This condition is treated with medicine. Medicines may be over-the-counter or prescription. You may be told to use one or more of the following:  Medicine that is taken orally.  Medicine that is applied as a cream.  Medicine that is inserted directly into the vagina (suppository).  Follow these instructions at home:  Take or apply over-the-counter and prescription medicines only as told by your health care provider.  Do not have sex until your health care provider has approved. Tell your sex partner that you have a yeast infection. That person should go to his or her health care provider if he or she develops symptoms.  Do not wear tight clothes, such as pantyhose or tight pants.  Avoid using tampons until your health care provider approves.  Eat more yogurt. This may help to keep your yeast infection from returning.  Try taking a sitz bath to help with discomfort. This is a warm water bath that is taken while you are sitting down. The water should only come up to your hips and should cover your buttocks. Do this 3-4 times per day or as told by  your health care provider.  Do not douche.  Wear breathable, cotton underwear.  If you have diabetes, keep your blood sugar levels under control. Contact a health care provider if:  You have a fever.  Your symptoms go away and then return.  Your symptoms do not get better with treatment.  Your symptoms get worse.  You have new symptoms.  You develop blisters in or around your vagina.  You have blood coming  from your vagina and it is not your menstrual period.  You develop pain in your abdomen. This information is not intended to replace advice given to you by your health care provider. Make sure you discuss any questions you have with your health care provider. Document Released: 12/20/2004 Document Revised: 08/24/2015 Document Reviewed: 09/13/2014 Elsevier Interactive Patient Education  2018 [employer].

## 2017-12-03 NOTE — Progress Notes (Signed)
Subjective:    Patient ID: Caitlin Barrett, female    DOB: 01-Jan-1971, 47 y.o.   MRN: [id number]  Caitlin Barrett is a 47 y.o. female presenting on 12/03/2017 for Vaginitis  HPI Vaginitis Patient presents today with white, vaginal discharge w/ no odor x 1 week.  It is associated with pruritis. Pt treated w/ diflucan on 11/26/2017 and symptoms improved, but returned after a few days.  She had recurrent yeast infection immediately after her hysterectomy.  Also, about 18 years ago had recurrent yeast infections, but had no complications since that time.  Continues to have some vaginal dryness after hysterectomy as well.  Social History   Tobacco Use  . Smoking status: Never Smoker  . Smokeless tobacco: Never Used  Substance Use Topics  . Alcohol use: Yes    Comment: rare  . Drug use: No    Review of Systems Per HPI unless specifically indicated above     Objective:    BP 119/69 (BP Location: Left Arm, Patient Position: Sitting, Cuff Size: Large)   Pulse 71   Temp 98.5 F (36.9 C) (Oral)   Ht 5\' 8"  (1.727 m)   Wt 276 lb 12.8 oz (125.6 kg)   LMP 05/26/2016 (Exact Date)   BMI 42.09 kg/m   Wt Readings from Last 3 Encounters:  12/03/17 276 lb 12.8 oz (125.6 kg)  10/22/17 277 lb 12.8 oz (126 kg)  10/07/17 276 lb 6.4 oz (125.4 kg)    Physical Exam  Constitutional: She is oriented to person, place, and time. She appears well-developed and well-nourished. No distress.  HENT:  Head: Normocephalic and atraumatic.  Cardiovascular: Normal rate, regular rhythm, S1 normal, S2 normal, normal heart sounds and intact distal pulses.  Pulmonary/Chest: Effort normal and breath sounds normal. No respiratory distress.  Genitourinary:  Genitourinary Comments: Normal external female genitalia without lesions or fusion. Vaginal canal without lesions. Cervix surgically absent.  Thick, white discharge on internal speculum exam. Bimanual exam without adnexal masses.  Surgically absent uterus and  cervix.  Neurological: She is alert and oriented to person, place, and time.  Skin: Skin is warm and dry.  Psychiatric: She has a normal mood and affect. Her behavior is normal.  Vitals reviewed.    Results for orders placed or performed in visit on 12/03/17  POCT Wet Prep [facility])  Result Value Ref Range   Source Wet Prep POC vagina    WBC, Wet Prep HPF POC few    Bacteria Wet Prep HPF POC Few Few   BACTERIA WET PREP MORPHOLOGY POC     Clue Cells Wet Prep HPF POC None None   Clue Cells Wet Prep Whiff POC Negative Whiff    Yeast Wet Prep HPF POC Moderate (A) None   KOH Wet Prep POC Moderate (A) None   Trichomonas Wet Prep HPF POC Absent Absent      Assessment & Plan:   Problem List Items Addressed This Visit    None    Visit Diagnoses    Vaginal candidiasis    -  Primary   Relevant Medications   fluconazole (DIFLUCAN) 150 MG tablet   Other Relevant Orders   POCT Wet Prep [facility]) (Completed)     Acute vaginal candidiasis noted on Wet Prep with moderate yeast buds and yeast hyphae present.    Plan: 1. START diflucan 150 mg once today, repeat in 3 days if symptoms persist. 2. Encouraged pt to keep undergarments clean and dry. 3. Reassured  pt is not sexually transmitted, but can have recurrent infection after treatment of BV or with different sexual partners and changes in vaginal pH.  Discouraged use of douching. 4. Followup as needed.   Meds ordered this encounter  Medications  . fluconazole (DIFLUCAN) 150 MG tablet    Sig: Take 1 tablet today and repeat in 3 days if symptoms persist.    Dispense:  2 tablet    Refill:  0    Order Specific Question:   Supervising Provider    Answer:   Smitty Cords [[id number]]    Follow up plan: Return 6-8 days if symptoms worsen or fail to improve.  Wilhelmina Mcardle, DNP, AGPCNP-BC Adult Gerontology Primary Care Nurse Practitioner [facility] 12/03/2017, 3:09 PM

## 2017-12-08 ENCOUNTER — Encounter: Payer: Self-pay | Admitting: Nurse Practitioner

## 2017-12-08 DIAGNOSIS — B3731 Acute candidiasis of vulva and vagina: Secondary | ICD-10-CM

## 2017-12-08 DIAGNOSIS — B373 Candidiasis of vulva and vagina: Secondary | ICD-10-CM

## 2017-12-09 MED ORDER — FLUCONAZOLE 150 MG PO TABS: 150.0000 mg | ORAL_TABLET | Freq: Once | ORAL | 0 refills | Status: AC

## 2017-12-12 ENCOUNTER — Ambulatory Visit: Payer: PRIVATE HEALTH INSURANCE

## 2017-12-12 DIAGNOSIS — M791 Myalgia, unspecified site: Secondary | ICD-10-CM

## 2017-12-12 DIAGNOSIS — M6281 Muscle weakness (generalized): Secondary | ICD-10-CM

## 2017-12-12 DIAGNOSIS — M62838 Other muscle spasm: Secondary | ICD-10-CM | POA: Diagnosis not present

## 2017-12-12 DIAGNOSIS — R278 Other lack of coordination: Secondary | ICD-10-CM

## 2017-12-12 NOTE — Therapy (Signed)
Moderate    Rehab Potential  Good    Clinical Impairments Affecting Rehab Potential  Hypothyroidism, depression, early menopause, obesity.    PT Frequency  Other (comment)    PT Duration  12 weeks    PT Treatment/Interventions  ADLs/Self Care Home Management;Biofeedback;Aquatic Therapy;Electrical Stimulation;Moist Heat;Traction;Functional mobility training;Therapeutic activities;Therapeutic exercise;Balance training;Neuromuscular re-education;Manual techniques;Patient/family education;Dry needling;Taping    PT Next Visit Plan  Asess pain in lower abdomen close to scar, re-assess PFM PRN. review and upgrade core exercises     PT Home Exercise Plan  diaphragmatic breathing, adductor stretch, child's pose, lengthening kegels (3 sec long-holds), sit-to-stand, squat form, log-roll, soda-can, squatty potty , TA in quadruped, pelvic tilts on ball, shoulder retractions, chin-tucks, rows, towel-roll extensions., bird-dog arms, seated low back stretch    Consulted and Agree with Plan of Care  Patient       Patient will benefit from skilled therapeutic intervention in order to improve the following deficits and impairments:  Increased fascial restricitons, Improper body mechanics, Pain, Decreased coordination, Decreased mobility, Decreased scar mobility, Increased muscle spasms, Postural dysfunction, Decreased activity tolerance, Decreased endurance, Decreased range of motion, Decreased strength, Decreased balance, Obesity  Visit Diagnosis: Other muscle spasm  Other lack of coordination  Muscle weakness (generalized)  Myalgia     Problem List Patient Active Problem List   Diagnosis Date Noted  . Monilial vaginitis 02/26/2017  . Vaginal dryness 02/26/2017  . Decreased libido 02/26/2017  . Anxiety with depression 02/26/2017  . Dyspareunia, female 02/26/2017  . S/P TAH (total abdominal hysterectomy) 07/02/2016  . 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 Nocturia 07/21/2015  . Stress incontinence 07/21/2015  . 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 Allergic rhinitis 06/21/2015  . Obesity (BMI 30-39.9) 06/21/2015   Willa Rough DPT, ATC Willa Rough 12/15/2017, 8:02 PM  [place] [facility] SERVICES [address] [place], [place], [address] Phone: [phone]   Fax:  [phone]  Name: Caitlin Barrett MRN: [id number] Date of Birth: May 06, 1970  Moderate    Rehab Potential  Good    Clinical Impairments Affecting Rehab Potential  Hypothyroidism, depression, early menopause, obesity.    PT Frequency  Other (comment)    PT Duration  12 weeks    PT Treatment/Interventions  ADLs/Self Care Home Management;Biofeedback;Aquatic Therapy;Electrical Stimulation;Moist Heat;Traction;Functional mobility training;Therapeutic activities;Therapeutic exercise;Balance training;Neuromuscular re-education;Manual techniques;Patient/family education;Dry needling;Taping    PT Next Visit Plan  Asess pain in lower abdomen close to scar, re-assess PFM PRN. review and upgrade core exercises     PT Home Exercise Plan  diaphragmatic breathing, adductor stretch, child's pose, lengthening kegels (3 sec long-holds), sit-to-stand, squat form, log-roll, soda-can, squatty potty , TA in quadruped, pelvic tilts on ball, shoulder retractions, chin-tucks, rows, towel-roll extensions., bird-dog arms, seated low back stretch    Consulted and Agree with Plan of Care  Patient       Patient will benefit from skilled therapeutic intervention in order to improve the following deficits and impairments:  Increased fascial restricitons, Improper body mechanics, Pain, Decreased coordination, Decreased mobility, Decreased scar mobility, Increased muscle spasms, Postural dysfunction, Decreased activity tolerance, Decreased endurance, Decreased range of motion, Decreased strength, Decreased balance, Obesity  Visit Diagnosis: Other muscle spasm  Other lack of coordination  Muscle weakness (generalized)  Myalgia     Problem List Patient Active Problem List   Diagnosis Date Noted  . Monilial vaginitis 02/26/2017  . Vaginal dryness 02/26/2017  . Decreased libido 02/26/2017  . Anxiety with depression 02/26/2017  . Dyspareunia, female 02/26/2017  . S/P TAH (total abdominal hysterectomy) 07/02/2016  . 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 Nocturia 07/21/2015  . Stress incontinence 07/21/2015  . 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 Allergic rhinitis 06/21/2015  . Obesity (BMI 30-39.9) 06/21/2015   Willa Rough DPT, ATC Willa Rough 12/15/2017, 8:02 PM  [place] [facility] SERVICES [address] [place], [place], [address] Phone: [phone]   Fax:  [phone]  Name: Caitlin Barrett MRN: [id number] Date of Birth: May 06, 1970  [place] MAIN [facility] SERVICES [address] [place], [place], [address] Phone: [phone]   Fax:  [phone]  Physical Therapy Treatment  Patient Details  Name: Caitlin Barrett MRN: [id number] Date of Birth: [birth year] Referring Provider: Cassell Smiles   Encounter Date: 12/12/2017  PT End of Session - 12/15/17 1958    Visit Number  21    Number of Visits  25    Date for PT Re-Evaluation  01/27/18    PT Start Time  3893    PT Stop Time  7342    PT Time Calculation (min)  70 min    Activity Tolerance  Patient tolerated treatment well;No increased pain    Behavior During Therapy  WFL for tasks assessed/performed       Past Medical History:  Diagnosis Date  . Allergy    seasonal.  Takes allergy shots at [facility]  . Anemia   . Anxiety   . Arthritis   . Asthma    exercise induced--no inhalers  . Cancer ([place]) 2010   Basal cell skin cancer Lt eye, skin graft above Lt eye  . Depression   . Dysmenorrhea   . Headache   . Hypothyroid    Nodules and Fine needle aspiration  . IBS (irritable bowel syndrome)   . Painful menstrual periods   . Plantar fasciitis   . Sprain of carpal (joint) of wrist     Past Surgical History:  Procedure Laterality Date  . ABDOMINAL HYSTERECTOMY Bilateral 07/02/2016   Procedure: HYSTERECTOMY ABDOMINAL WITH BILATERAL SALPINGECTOMY-MIDLINE INCISION;  Surgeon: Brayton Mars, MD;  Location: [facility] ORS;  Service: Gynecology;  Laterality: Bilateral;  . CHOLECYSTECTOMY  2009  . HERNIA REPAIR  8768   umbilical  . SKIN GRAFT  2010  . TONSILLECTOMY      There were no vitals filed for this visit.    Pelvic Floor Physical Therapy Treatment Note  SCREENING  Changes in medications, allergies, or medical history?: Started Diflucan for yeast infection.    SUBJECTIVE  Patient reports: She is doing pretty good overall, a little back pain but not as persistent. She is still fighting a yeast infection  so has not tried intercourse.  Pain update:  Location of pain: Low and mid-back, R hip Current pain:  2/10  Max pain:  4/10 Least pain:  0/10 Nature of pain: achy  Patient Goals: No Pain with Intercourse, maintaining improvement in urinary symptoms while increasing water intake to decrease constipation. No return of pelvic pain.   OBJECTIVE  Changes in: Posture/Observations:  Pt. Continues to demonstrate mild forward head and shoulders, the weight of her breasts is likely a big contributing factor to reverting to this posture when tired.  Palpation: TTP through R paraspinals and Multifidus from ~ T6-L3 and L Paraspinals at ~ L1-L3.  INTERVENTIONS THIS SESSION: Manual: Performed STM and TP release to TTP through R paraspinals and Multifidus from ~ T6-L3 and L Paraspinals at ~ L1-L3 to decrease pain and spasm and allow for decreased tension on lumbar nerve roots for decreased PFM spasms and to allow for improved ability to perform HEP. Dry needle: Performed TPDN with a .30x90 and a .30x90m needle to TTP through R paraspinals and Multifidus from ~ T6-L3 and L Paraspinals at ~ L1-L3 to decrease pain and spasm and allow for decreased tension on lumbar nerve roots for decreased PFM spasms and to allow for improved ability to perform HEP.  Total time: 70 min.

## 2017-12-13 ENCOUNTER — Other Ambulatory Visit (HOSPITAL_COMMUNITY)
Admission: RE | Admit: 2017-12-13 | Discharge: 2017-12-13 | Disposition: A | Discharge status: Home / Self Care | Payer: PRIVATE HEALTH INSURANCE | Source: Ambulatory Visit | Attending: Nurse Practitioner | Admitting: Nurse Practitioner

## 2017-12-13 ENCOUNTER — Other Ambulatory Visit: Payer: Self-pay

## 2017-12-13 ENCOUNTER — Encounter: Payer: Self-pay | Admitting: Nurse Practitioner

## 2017-12-13 ENCOUNTER — Ambulatory Visit (INDEPENDENT_AMBULATORY_CARE_PROVIDER_SITE_OTHER): Payer: PRIVATE HEALTH INSURANCE | Admitting: Nurse Practitioner

## 2017-12-13 VITALS — BP 124/63 | HR 69 | Temp 98.3°F | Resp 16 | Ht 68.0 in | Wt 277.0 lb

## 2017-12-13 DIAGNOSIS — N760 Acute vaginitis: Secondary | ICD-10-CM | POA: Diagnosis not present

## 2017-12-13 DIAGNOSIS — B9689 Other specified bacterial agents as the cause of diseases classified elsewhere: Secondary | ICD-10-CM

## 2017-12-13 MED ORDER — METRONIDAZOLE 500 MG PO TABS: 500.0000 mg | ORAL_TABLET | Freq: Two times a day (BID) | ORAL | 0 refills | Status: DC

## 2017-12-13 NOTE — Progress Notes (Signed)
Subjective:    Patient ID: Caitlin Barrett, female    DOB: December 24, 1970, 47 y.o.   MRN: [id number]  Caitlin Barrett is a 47 y.o. female presenting on 12/13/2017 for Sinusitis (headache, nasal congestion w/ bloody mucus and facial pressure around the nasal cavity and under the eyes x 4 days) and Vaginitis (persistent discharge )   HPI Sinusitis Onset of symptoms x 4 days.  Symptoms include headache, sinus pressure, facial pressure near nose and eyes, bloody rhinorrhea occasionally, some congestion with rhinorrhea, itchy eyes. Patient has long history of allergic rhinitis for which she sees an allergist.  Pt denies ear pressure/pain/fullness, shortness of breath, difficulty breathing, fever, chills, sweats, nausea, vomiting, diarrhea and constipation. Has had no sick contacts.   Vaginitis Patient has had candidal vaginitis and has been treated with diflucan 150 mg tab x 4-5 total doses since 12/03/2017.  She continues having vaginal pruritus and discharge.  Social History   Tobacco Use  . Smoking status: Never Smoker  . Smokeless tobacco: Never Used  Substance Use Topics  . Alcohol use: Yes    Comment: rare  . Drug use: No    Review of Systems Per HPI unless specifically indicated above     Objective:    BP 124/63 (BP Location: Left Arm, Patient Position: Sitting, Cuff Size: Normal)   Pulse 69   Temp 98.3 F (36.8 C) (Oral)   Resp 16   Ht 5\' 8"  (1.727 m)   Wt 277 lb (125.6 kg)   LMP 05/26/2016 (Exact Date)   SpO2 99%   BMI 42.12 kg/m   Wt Readings from Last 3 Encounters:  12/13/17 277 lb (125.6 kg)  12/03/17 276 lb 12.8 oz (125.6 kg)  10/22/17 277 lb 12.8 oz (126 kg)    Physical Exam  Constitutional: She appears well-developed and well-nourished. No distress.  HENT:  Head: Normocephalic and atraumatic.  Right Ear: Hearing, tympanic membrane, external ear and ear canal normal.  Left Ear: Hearing, tympanic membrane, external ear and ear canal normal.  Nose: Mucosal  edema and rhinorrhea present. Right sinus exhibits maxillary sinus tenderness and frontal sinus tenderness. Left sinus exhibits maxillary sinus tenderness and frontal sinus tenderness.  Mouth/Throat: Uvula is midline and mucous membranes are normal. Posterior oropharyngeal edema (cobblestoning) present. Oropharyngeal exudate: clear secretions.  Neck: Normal range of motion. Neck supple.  Cardiovascular: Normal rate, regular rhythm, S1 normal, S2 normal, normal heart sounds and intact distal pulses.  Pulmonary/Chest: Effort normal and breath sounds normal. No respiratory distress.  Lymphadenopathy:    She has cervical adenopathy.  Neurological: She is alert.  Skin: Skin is warm and dry.  Psychiatric: She has a normal mood and affect. Her behavior is normal.  Vitals reviewed.   Results for orders placed or performed in visit on 12/03/17  POCT Wet Prep [facility])  Result Value Ref Range   Source Wet Prep POC vagina    WBC, Wet Prep HPF POC few    Bacteria Wet Prep HPF POC Few Few   BACTERIA WET PREP MORPHOLOGY POC     Clue Cells Wet Prep HPF POC None None   Clue Cells Wet Prep Whiff POC Negative Whiff    Yeast Wet Prep HPF POC Moderate (A) None   KOH Wet Prep POC Moderate (A) None   Trichomonas Wet Prep HPF POC Absent Absent      Assessment & Plan:   Problem List Items Addressed This Visit    None    Visit  Diagnoses    BV (bacterial vaginosis)    -  Primary   Relevant Medications   metroNIDAZOLE (FLAGYL) 500 MG tablet   Acute vaginitis       Relevant Medications   metroNIDAZOLE (FLAGYL) 500 MG tablet   Other Relevant Orders   Fungus Culture & Smear   Cervicovaginal ancillary only      Meds ordered this encounter  Medications  . metroNIDAZOLE (FLAGYL) 500 MG tablet    Sig: Take 1 tablet (500 mg total) by mouth 2 (two) times daily for 7 days.    Dispense:  14 tablet    Refill:  0    Order Specific Question:   Supervising Provider    Answer:   Smitty Cords  [[id number]]   # Acute illness. Fever responsive to NSAIDs and tylenol.  Symptoms not worsening. Consistent with viral illness x 4 days with no known sick contacts and no identifiable focal infections of ears, nose, throat.  Plan: 1. Reassurance, likely self-limited with cough lasting up to few weeks - Start Atrovent nasal spray decongestant 2 sprays each nostril up to 4 times daily for 5-7 days - Continue anti-histamine Loratadine 10mg  daily,  - also can use Flonase 2 sprays each nostril daily for up to 4-6 weeks - Start Mucinex-DM OTC up to 7-10 days then stop 2. Supportive care with nasal saline, warm herbal tea with honey, 3. Improve hydration 4. Tylenol / Motrin PRN fevers 5. Return criteria given    # Clinically consistent with vaginal discharge on exam and higher likelihood after treatment for vaginal candidiasis.  Plan: 1. Start Metronidazole 500mg  BID x 7 days (avoid alcohol) 2. Counseled on reducing recurrences with condom use, may try yogurt, probiotics, alternatively some patients are prone to recurrences with certain partners. 3. Sent aptima swab for lab confirmation of BV/Yeast as well as yeast culture for possible resistant candidiasis. 4. Followup prn after labs.   Follow up plan: No follow-ups on file.  Wilhelmina Mcardle, DNP, AGPCNP-BC Adult Gerontology Primary Care Nurse Practitioner [facility] 12/13/2017, 11:15 AM

## 2017-12-13 NOTE — Patient Instructions (Addendum)
Caitlin Barrett,   Thank you for coming in to clinic today.  1. It sounds like you have a Upper Respiratory Virus - this will most likely run it's course in 7 to 10 days. Recommend good hand washing. - Start Atrovent nasal spray decongestant 2 sprays each nostril up to 4 times daily for 5-7 days - Continue anti-histamine loratadine 71m daily, also can use Flonase 2 sprays each nostril daily for 2-4 weeks - If congestion is worse, start OTC Mucinex (or may try Mucinex-DM for cough) up to 7-10 days then stop - Drink plenty of fluids to improve congestion - You may try over the counter Nasal Saline spray (Simply Saline, Ocean Spray) as needed to reduce congestion. - Drink warm herbal tea with honey for sore throat. - Start taking Tylenol extra strength 1 to 2 tablets every 6-8 hours for aches or fever/chills for next few days as needed.  Do not take more than 3,000 mg in 24 hours from all medicines.  May take Ibuprofen as well if tolerated 200-402mevery 8 hours as needed.  If symptoms significantly worsening with persistent fevers/chills despite tylenol/ibpurofen, nausea, vomiting unable to tolerate food/fluids or medicine, body aches, or shortness of breath, sinus pain pressure or worsening productive cough, then follow-up for re-evaluation, may seek more immediate care at Urgent Care or ED if more concerned for emergency.   2. Yeast infection: - no additional meds until culture returns.  3. Likely have a new BV infection.  START metronidazole 500 mg twice daily for 7 days  Please schedule a follow-up appointment with LaCassell SmilesAGNP. Return 5-7 days if symptoms worsen or fail to improve.  If you have any other questions or concerns, please feel free to call the clinic or send a message through MyCentraliaYou may also schedule an earlier appointment if necessary.  You will receive a survey after today's visit either digitally by e-mail or paper by [facility]. Robinson WorldwideYour experiences and  feedback matter to usKorea Please respond so we know how we are doing as we provide care for you.   LaCassell SmilesDNP, AGNP-BC Adult Gerontology Nurse Practitioner [facility]

## 2017-12-16 ENCOUNTER — Other Ambulatory Visit: Payer: Self-pay | Admitting: Nurse Practitioner

## 2017-12-16 LAB — CERVICOVAGINAL ANCILLARY ONLY
Bacterial vaginitis: NEGATIVE
Candida vaginitis: NEGATIVE

## 2017-12-24 ENCOUNTER — Ambulatory Visit: Payer: PRIVATE HEALTH INSURANCE | Attending: Nurse Practitioner

## 2017-12-24 DIAGNOSIS — M791 Myalgia, unspecified site: Secondary | ICD-10-CM | POA: Insufficient documentation

## 2017-12-24 DIAGNOSIS — M6281 Muscle weakness (generalized): Secondary | ICD-10-CM | POA: Insufficient documentation

## 2017-12-24 DIAGNOSIS — M62838 Other muscle spasm: Secondary | ICD-10-CM | POA: Diagnosis present

## 2017-12-24 DIAGNOSIS — R278 Other lack of coordination: Secondary | ICD-10-CM | POA: Diagnosis present

## 2017-12-24 NOTE — Therapy (Signed)
[name] MAIN [facility] SERVICES [address] [place], [place], [address] Phone: [phone]   Fax:  [phone]  Physical Therapy Treatment  Patient Details  Name: Caitlin Barrett MRN: [id number] Date of Birth: 08-24-1970 Referring Provider (PT): Cassell Smiles   Encounter Date: 12/24/2017  PT End of Session - 12/27/17 1027    Visit Number  22    Number of Visits  25    Date for PT Re-Evaluation  01/27/18    PT Start Time  1600    PT Stop Time  1700    PT Time Calculation (min)  60 min    Activity Tolerance  Patient tolerated treatment well;No increased pain    Behavior During Therapy  WFL for tasks assessed/performed       Past Medical History:  Diagnosis Date  . Allergy    seasonal.  Takes allergy shots at [facility]  . Anemia   . Anxiety   . Arthritis   . Asthma    exercise induced--no inhalers  . Cancer ([place]) 2010   Basal cell skin cancer Lt eye, skin graft above Lt eye  . Depression   . Dysmenorrhea   . Headache   . Hypothyroid    Nodules and Fine needle aspiration  . IBS (irritable bowel syndrome)   . Painful menstrual periods   . Plantar fasciitis   . Sprain of carpal (joint) of wrist     Past Surgical History:  Procedure Laterality Date  . ABDOMINAL HYSTERECTOMY Bilateral 07/02/2016   Procedure: HYSTERECTOMY ABDOMINAL WITH BILATERAL SALPINGECTOMY-MIDLINE INCISION;  Surgeon: Brayton Mars, MD;  Location: [facility] ORS;  Service: Gynecology;  Laterality: Bilateral;  . CHOLECYSTECTOMY  2009  . HERNIA REPAIR  2952   umbilical  . SKIN GRAFT  2010  . TONSILLECTOMY      There were no vitals filed for this visit.    Pelvic Floor Physical Therapy Treatment Note  SCREENING  Changes in medications, allergies, or medical history?: no    SUBJECTIVE  Patient reports: Was feeling pretty good until she took a spill and landed on her L heel/shoulder and hip while trying to adjust a curtain rod. Was having slight  discomfort with intercourse that feels more like burning. Has been having pain in certain positions near her scar in the abdomen still. Is now having pain in her R hip all the way around, and in her R ribs, ankle, and shoulder from the fall.   Patient Goals: No Pain with Intercourse, maintaining improvement in urinary symptoms while increasing water intake to decrease constipation. No return of pelvic pain.   OBJECTIVE  Changes in: Posture/Observations:  Forward head and shoulders, less than at last visit.  Pelvic floor: TTP to to L posterior PR/PC and R coccygeus and anterior PR/PC    INTERVENTIONS THIS SESSION: Manual: re-assessed PFM and performed TP release to L posterior PR/PC and R coccygeus and anterior PR/PC  to decrease spasm and pain with intercourse and improve function of the PFM. Self-care: reviewed how to use my fitness pal to help with weight-loss goal and the need to do self internal TP release at least every 3rd day to maintain and continue to improve upon PFM spasms.   Total time: 60 min.                         PT Education - 12/27/17 1027    Education provided  Yes    Education  [name] [facility]IN [facility] SERVICES [address] [place], [place], [address] Phone: [phone]   Fax:  [phone]  Physical Therapy Treatment  Patient Details  Name: Caitlin Barrett MRN: [id number] Date of Birth: 08-24-1970 Referring Provider (PT): Cassell Smiles   Encounter Date: 12/24/2017  PT End of Session - 12/27/17 1027    Visit Number  22    Number of Visits  25    Date for PT Re-Evaluation  01/27/18    PT Start Time  1600    PT Stop Time  1700    PT Time Calculation (min)  60 min    Activity Tolerance  Patient tolerated treatment well;No increased pain    Behavior During Therapy  WFL for tasks assessed/performed       Past Medical History:  Diagnosis Date  . Allergy    seasonal.  Takes allergy shots at [facility]  . Anemia   . Anxiety   . Arthritis   . Asthma    exercise induced--no inhalers  . Cancer ([place]) 2010   Basal cell skin cancer Lt eye, skin graft above Lt eye  . Depression   . Dysmenorrhea   . Headache   . Hypothyroid    Nodules and Fine needle aspiration  . IBS (irritable bowel syndrome)   . Painful menstrual periods   . Plantar fasciitis   . Sprain of carpal (joint) of wrist     Past Surgical History:  Procedure Laterality Date  . ABDOMINAL HYSTERECTOMY Bilateral 07/02/2016   Procedure: HYSTERECTOMY ABDOMINAL WITH BILATERAL SALPINGECTOMY-MIDLINE INCISION;  Surgeon: Brayton Mars, MD;  Location: [facility] ORS;  Service: Gynecology;  Laterality: Bilateral;  . CHOLECYSTECTOMY  2009  . HERNIA REPAIR  2952   umbilical  . SKIN GRAFT  2010  . TONSILLECTOMY      There were no vitals filed for this visit.    Pelvic Floor Physical Therapy Treatment Note  SCREENING  Changes in medications, allergies, or medical history?: no    SUBJECTIVE  Patient reports: Was feeling pretty good until she took a spill and landed on her L heel/shoulder and hip while trying to adjust a curtain rod. Was having slight  discomfort with intercourse that feels more like burning. Has been having pain in certain positions near her scar in the abdomen still. Is now having pain in her R hip all the way around, and in her R ribs, ankle, and shoulder from the fall.   Patient Goals: No Pain with Intercourse, maintaining improvement in urinary symptoms while increasing water intake to decrease constipation. No return of pelvic pain.   OBJECTIVE  Changes in: Posture/Observations:  Forward head and shoulders, less than at last visit.  Pelvic floor: TTP to to L posterior PR/PC and R coccygeus and anterior PR/PC    INTERVENTIONS THIS SESSION: Manual: re-assessed PFM and performed TP release to L posterior PR/PC and R coccygeus and anterior PR/PC  to decrease spasm and pain with intercourse and improve function of the PFM. Self-care: reviewed how to use my fitness pal to help with weight-loss goal and the need to do self internal TP release at least every 3rd day to maintain and continue to improve upon PFM spasms.   Total time: 60 min.                         PT Education - 12/27/17 1027    Education provided  Yes    Education  Therapy;Electrical  Stimulation;Moist Heat;Traction;Functional mobility training;Therapeutic activities;Therapeutic exercise;Balance training;Neuromuscular re-education;Manual techniques;Patient/family education;Dry needling;Taping    PT Next Visit Plan  Upgrade core exercises, review posture, try taping for upper posture.    PT Home Exercise Plan  diaphragmatic breathing, adductor stretch, child's pose, lengthening kegels (3 sec long-holds), sit-to-stand, squat form, log-roll, soda-can, squatty potty , TA in quadruped, pelvic tilts on ball, shoulder retractions, chin-tucks, rows, towel-roll extensions., bird-dog arms, seated low back stretch    Consulted and Agree with Plan of Care  Patient       Patient will benefit from skilled therapeutic intervention in order to improve the following deficits and impairments:  Increased fascial restricitons, Improper body mechanics, Pain, Decreased coordination, Decreased mobility, Decreased scar mobility, Increased muscle spasms, Postural dysfunction, Decreased activity tolerance, Decreased endurance, Decreased range of motion, Decreased strength, Decreased balance, Obesity  Visit Diagnosis: Other muscle spasm  Other lack of coordination  Muscle weakness (generalized)  Myalgia     Problem List Patient Active Problem List   Diagnosis Date Noted  . Monilial vaginitis 02/26/2017  . Vaginal dryness 02/26/2017  . Decreased libido 02/26/2017  . Anxiety with depression 02/26/2017  . Dyspareunia, female 02/26/2017  . S/P TAH (total abdominal hysterectomy) 07/02/2016  . 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 Nocturia 07/21/2015  . Stress incontinence 07/21/2015  . 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 Allergic rhinitis 06/21/2015  . Obesity (BMI 30-39.9) 06/21/2015   Willa Rough DPT, ATC Willa Rough 12/27/2017, 10:30 AM  [place] MAIN [facility] SERVICES [address] [place], [place],  [address] Phone: [phone]   Fax:  [phone]  Name: Caitlin Barrett MRN: [id number] Date of Birth: 08/16/1970

## 2018-01-06 ENCOUNTER — Other Ambulatory Visit: Payer: Self-pay | Admitting: Nurse Practitioner

## 2018-01-06 DIAGNOSIS — Z1231 Encounter for screening mammogram for malignant neoplasm of breast: Secondary | ICD-10-CM

## 2018-01-07 ENCOUNTER — Encounter: Payer: Self-pay | Admitting: Nurse Practitioner

## 2018-01-10 ENCOUNTER — Ambulatory Visit: Payer: PRIVATE HEALTH INSURANCE

## 2018-01-10 DIAGNOSIS — M62838 Other muscle spasm: Secondary | ICD-10-CM | POA: Diagnosis not present

## 2018-01-10 DIAGNOSIS — R278 Other lack of coordination: Secondary | ICD-10-CM

## 2018-01-10 DIAGNOSIS — M6281 Muscle weakness (generalized): Secondary | ICD-10-CM

## 2018-01-10 DIAGNOSIS — M791 Myalgia, unspecified site: Secondary | ICD-10-CM

## 2018-01-10 LAB — FUNGUS CULTURE W SMEAR
MICRO NUMBER:: 91132145
SMEAR:: NONE SEEN
SPECIMEN QUALITY:: ADEQUATE

## 2018-01-10 NOTE — Therapy (Signed)
[place] MAIN [facility] SERVICES [address] [place], [place], [address] Phone: [phone]   Fax:  [phone]  Physical Therapy Treatment  Patient Details  Name: Caitlin Barrett MRN: [id number] Date of Birth: 23-Dec-1970 Referring Provider (PT): Cassell Smiles   Encounter Date: 01/10/2018  PT End of Session - 01/13/18 0959    Visit Number  23    Number of Visits  25    Date for PT Re-Evaluation  01/27/18    PT Start Time  0900    PT Stop Time  1000    PT Time Calculation (min)  60 min    Activity Tolerance  Patient tolerated treatment well;No increased pain    Behavior During Therapy  WFL for tasks assessed/performed       Past Medical History:  Diagnosis Date  . Allergy    seasonal.  Takes allergy shots at [facility]  . Anemia   . Anxiety   . Arthritis   . Asthma    exercise induced--no inhalers  . Cancer ([place]) 2010   Basal cell skin cancer Lt eye, skin graft above Lt eye  . Depression   . Dysmenorrhea   . Headache   . Hypothyroid    Nodules and Fine needle aspiration  . IBS (irritable bowel syndrome)   . Painful menstrual periods   . Plantar fasciitis   . Sprain of carpal (joint) of wrist     Past Surgical History:  Procedure Laterality Date  . ABDOMINAL HYSTERECTOMY Bilateral 07/02/2016   Procedure: HYSTERECTOMY ABDOMINAL WITH BILATERAL SALPINGECTOMY-MIDLINE INCISION;  Surgeon: Brayton Mars, MD;  Location: [facility] ORS;  Service: Gynecology;  Laterality: Bilateral;  . CHOLECYSTECTOMY  2009  . HERNIA REPAIR  3754   umbilical  . SKIN GRAFT  2010  . TONSILLECTOMY      There were no vitals filed for this visit.    Pelvic Floor Physical Therapy Treatment Note  SCREENING  Changes in medications, allergies, or medical history?: no    SUBJECTIVE  Patient reports: She has started doing   Pain update:  Location of pain: R hip Current pain:  5/10  Max pain:  8/10 Least pain:  2/10 Nature of pain:  Sharp  Patient Goals: No Pain with Intercourse, maintaining improvement in urinary symptoms while increasing water intake to decrease constipation. No return of pelvic pain.    OBJECTIVE  Changes in: Posture/Observations:  Slight anterior head/shoulders. PSIS level.  Palpation: TTP to R lumbar paraspinals, Glute min and max, lumbar paraspinals, and deep hip external rotators  INTERVENTIONS THIS SESSION: Self-care: educated on application of k-tape for upper posture and use of thermal modalities to help decrease pain at sternum. Manual: Performed STM and TP release to R lumbar paraspinals, Glute min and max. To decrease pain and spasm and allow for decreased pressure on lumbosacral nerve roots for decreased spasms and pain both internally and externally at the pelvis. Dry-needle: Performed TPDN with a .30x42m needle and standard approach to R lumbar paraspinals, Glute min and max, lumbar paraspinals, and deep hip external rotators to decrease pain and spasm and allow for decreased pressure on lumbosacral nerve roots for decreased spasms and pain both internally and externally at the pelvis..   Total time: 60 min.                    Trigger Point Dry Needling - 01/13/18 0957    Consent Given?  Yes    Education Handout Provided  Continue per POC.    Clinical Presentation  Stable    Clinical Decision Making  Moderate    Rehab Potential  Good    Clinical Impairments Affecting Rehab Potential  Hypothyroidism, depression, early menopause, obesity.    PT Frequency  Other (comment)    PT Duration  12 weeks    PT Treatment/Interventions  ADLs/Self Care Home Management;Biofeedback;Aquatic Therapy;Electrical Stimulation;Moist Heat;Traction;Functional mobility training;Therapeutic activities;Therapeutic exercise;Balance training;Neuromuscular re-education;Manual techniques;Patient/family education;Dry needling;Taping    PT Next Visit Plan  Upgrade core exercises, review posture, try taping for upper posture.    PT Home Exercise Plan  diaphragmatic breathing, adductor stretch, child's pose, lengthening kegels (3 sec long-holds), sit-to-stand, squat form, log-roll, soda-can, squatty potty , TA in quadruped, pelvic tilts on ball, shoulder retractions, chin-tucks, rows, towel-roll extensions., bird-dog arms, seated low back stretch    Consulted and Agree with Plan of Care  Patient       Patient will benefit from skilled therapeutic intervention in order to improve the following deficits and impairments:  Increased fascial restricitons, Improper body mechanics, Pain, Decreased coordination, Decreased mobility, Decreased scar mobility, Increased muscle spasms, Postural dysfunction, Decreased activity tolerance, Decreased endurance, Decreased range of motion, Decreased strength, Decreased balance, Obesity  Visit Diagnosis: Other muscle spasm  Other lack of coordination  Muscle weakness (generalized)  Myalgia     Problem List Patient Active Problem List   Diagnosis Date Noted  . Monilial vaginitis 02/26/2017  . Vaginal dryness 02/26/2017  . Decreased libido 02/26/2017  . Anxiety with depression 02/26/2017  .  Dyspareunia, female 02/26/2017  . S/P TAH (total abdominal hysterectomy) 07/02/2016  . 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 Nocturia 07/21/2015  . Stress incontinence 07/21/2015  . 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 Allergic rhinitis 06/21/2015  . Obesity (BMI 30-39.9) 06/21/2015   Willa Rough DPT, ATC Willa Rough 01/13/2018, 10:09 AM  [place] MAIN [facility] SERVICES [address] [place], [place], [address] Phone: [phone]   Fax:  [phone]  Name: Caitlin Barrett MRN: [id number] Date of Birth: 12/05/1970  [place] MAIN [facility] SERVICES [address] [place], [place], [address] Phone: [phone]   Fax:  [phone]  Physical Therapy Treatment  Patient Details  Name: Caitlin Barrett MRN: [id number] Date of Birth: 23-Dec-1970 Referring Provider (PT): Cassell Smiles   Encounter Date: 01/10/2018  PT End of Session - 01/13/18 0959    Visit Number  23    Number of Visits  25    Date for PT Re-Evaluation  01/27/18    PT Start Time  0900    PT Stop Time  1000    PT Time Calculation (min)  60 min    Activity Tolerance  Patient tolerated treatment well;No increased pain    Behavior During Therapy  WFL for tasks assessed/performed       Past Medical History:  Diagnosis Date  . Allergy    seasonal.  Takes allergy shots at [facility]  . Anemia   . Anxiety   . Arthritis   . Asthma    exercise induced--no inhalers  . Cancer ([place]) 2010   Basal cell skin cancer Lt eye, skin graft above Lt eye  . Depression   . Dysmenorrhea   . Headache   . Hypothyroid    Nodules and Fine needle aspiration  . IBS (irritable bowel syndrome)   . Painful menstrual periods   . Plantar fasciitis   . Sprain of carpal (joint) of wrist     Past Surgical History:  Procedure Laterality Date  . ABDOMINAL HYSTERECTOMY Bilateral 07/02/2016   Procedure: HYSTERECTOMY ABDOMINAL WITH BILATERAL SALPINGECTOMY-MIDLINE INCISION;  Surgeon: Brayton Mars, MD;  Location: [facility] ORS;  Service: Gynecology;  Laterality: Bilateral;  . CHOLECYSTECTOMY  2009  . HERNIA REPAIR  3754   umbilical  . SKIN GRAFT  2010  . TONSILLECTOMY      There were no vitals filed for this visit.    Pelvic Floor Physical Therapy Treatment Note  SCREENING  Changes in medications, allergies, or medical history?: no    SUBJECTIVE  Patient reports: She has started doing   Pain update:  Location of pain: R hip Current pain:  5/10  Max pain:  8/10 Least pain:  2/10 Nature of pain:  Sharp  Patient Goals: No Pain with Intercourse, maintaining improvement in urinary symptoms while increasing water intake to decrease constipation. No return of pelvic pain.    OBJECTIVE  Changes in: Posture/Observations:  Slight anterior head/shoulders. PSIS level.  Palpation: TTP to R lumbar paraspinals, Glute min and max, lumbar paraspinals, and deep hip external rotators  INTERVENTIONS THIS SESSION: Self-care: educated on application of k-tape for upper posture and use of thermal modalities to help decrease pain at sternum. Manual: Performed STM and TP release to R lumbar paraspinals, Glute min and max. To decrease pain and spasm and allow for decreased pressure on lumbosacral nerve roots for decreased spasms and pain both internally and externally at the pelvis. Dry-needle: Performed TPDN with a .30x42m needle and standard approach to R lumbar paraspinals, Glute min and max, lumbar paraspinals, and deep hip external rotators to decrease pain and spasm and allow for decreased pressure on lumbosacral nerve roots for decreased spasms and pain both internally and externally at the pelvis..   Total time: 60 min.                    Trigger Point Dry Needling - 01/13/18 0957    Consent Given?  Yes    Education Handout Provided

## 2018-01-21 ENCOUNTER — Ambulatory Visit: Payer: PRIVATE HEALTH INSURANCE

## 2018-01-21 DIAGNOSIS — M6281 Muscle weakness (generalized): Secondary | ICD-10-CM

## 2018-01-21 DIAGNOSIS — M62838 Other muscle spasm: Secondary | ICD-10-CM

## 2018-01-21 DIAGNOSIS — R278 Other lack of coordination: Secondary | ICD-10-CM

## 2018-01-21 DIAGNOSIS — M791 Myalgia, unspecified site: Secondary | ICD-10-CM

## 2018-01-21 NOTE — Patient Instructions (Addendum)
Use a tennis ball and foam roller to provide self trigger point release to the muscles in the back and bottom. Hold the tender spot, taking deep breaths until the pain reduces by at least 50%.  For running, focus on leaning toward the ground rather than "rounding" forward to see what is in front of you. Pull your shoulders back and down and let your arms move freely forward and back rather than across your body. Always land on a soft knee, toward the mid-foot rather than heel and let yourself take more short steps rather than reaching for longer steps.   Look into piliates style classes to continue strengthening the deep core to allow your back to relax and keep you strong.

## 2018-01-21 NOTE — Therapy (Signed)
[place] [facility] MAIN [facility] SERVICES [address] [place], [place], [address] Phone: [phone]   Fax:  [phone]  Physical Therapy Treatment  Patient Details  Name: Caitlin Barrett MRN: [id number] Date of Birth: [birth year] Referring Provider (PT): Wilhelmina Mcardle   Encounter Date: 01/21/2018  PT End of Session - 01/22/18 0950    Visit Number  24    Number of Visits  25    Date for PT Re-Evaluation  01/27/18    PT Start Time  1600    PT Stop Time  1700    PT Time Calculation (min)  60 min    Activity Tolerance  Patient tolerated treatment well;No increased pain    Behavior During Therapy  WFL for tasks assessed/performed       Past Medical History:  Diagnosis Date  . Allergy    seasonal.  Takes allergy shots at [facility]  . Anemia   . Anxiety   . Arthritis   . Asthma    exercise induced--no inhalers  . Cancer ([facility]) 2010   Basal cell skin cancer Lt eye, skin graft above Lt eye  . Depression   . Dysmenorrhea   . Headache   . Hypothyroid    Nodules and Fine needle aspiration  . IBS (irritable bowel syndrome)   . Painful menstrual periods   . Plantar fasciitis   . Sprain of carpal (joint) of wrist     Past Surgical History:  Procedure Laterality Date  . ABDOMINAL HYSTERECTOMY Bilateral 07/02/2016   Procedure: HYSTERECTOMY ABDOMINAL WITH BILATERAL SALPINGECTOMY-MIDLINE INCISION;  Surgeon: Herold Harms, MD;  Location: [facility] ORS;  Service: Gynecology;  Laterality: Bilateral;  . CHOLECYSTECTOMY  2009  . HERNIA REPAIR  1974   umbilical  . SKIN GRAFT  2010  . TONSILLECTOMY      There were no vitals filed for this visit.    Pelvic Floor Physical Therapy Treatment Note  SCREENING  Changes in medications, allergies, or medical history?: no    SUBJECTIVE  Patient reports: Is doing ok, has some pain in her low back and into R leg, thinks that walking new golden retrievers has set it off.  Had a little pinchy at the  very beginning of intercourse, much better after. Is pleased with this outcome.  Pain update: Mild/moderate pain in R LE and low back, resolved with exercise during session.    Patient Goals: No Pain with Intercourse, maintaining improvement in urinary symptoms while increasing water intake to decrease constipation. No return of pelvic pain.   OBJECTIVE  Changes in: Posture/Observations:  Forward rolled shoulders and forward head.  Palpation: TTP through lumbar paraspinals and R piriformis.  Gait Analysis: Forward rounded shoulders, narrow BOS, pushes through foot for propulsion.  INTERVENTIONS THIS SESSION: Theract: applied kinesio-tape on posterior upper torso to give proprioceptive input into improved posture/decreased upper crossed syndrome to allow for improved alignment down the chain and decreased strain on the back and pelvis. Educated on and practiced how to use a tennis ball and foam roller to release TP's at home in the back and piriformis/glutes to help Pt. Self-manage pain and spasms as she returns to higher activity levels. Therex: educated on what types of exercise are appropriate for her to perform to continue to strengthen the deep-core and not cause a return of Sx. Including pilates-style exercises. Educated on how to walk and run with good mechanics to decrease back spasms, decrease impact with the ground to help prevent PFM spasms,  and decrease wear and tear on the hips, knees, and back while allowing for maximal energy expenditure for weight-loss.  Total time: 60 min.                         PT Education - 01/22/18 0950    Education provided  Yes    Education Details  See Pt. Instructions and Interventions this session.    Person(s) Educated  Patient    Methods  Explanation;Demonstration;Verbal cues;Handout    Comprehension  Verbalized understanding;Returned demonstration;Verbal cues required;Tactile cues required       PT Short Term  Goals - 11/04/17 1650      PT SHORT TERM GOAL #1   Title  Patient will demonstrate a coordinated contraction, relaxation, and bulge of the pelvic floor muscles to demonstrate functional recruitment and motion and allow for further strengthening.    Time  6    Period  Weeks    Status  Achieved    Target Date  06/03/17      PT SHORT TERM GOAL #2   Title  Patient will demonstrate appropriate body mechanics with bending and lifting heavy objects to allow for decreased stress on the pelvic floor and low back.     Time  6    Period  Weeks    Status  Achieved    Target Date  06/03/17      PT SHORT TERM GOAL #3   Title  Patient will demonstrate HEP x1 in the clinic to demonstrate understanding and proper form to allow for further improvement.    Time  6    Period  Weeks    Status  Achieved    Target Date  06/03/17      PT SHORT TERM GOAL #4   Title  Pt. will demonstrate no TTP through lumbar region to demonstrate decreased spasm and decreased pressure on nerve roots that innervate the pelvis.    Baseline  TTP B through QL, Paraspinals, and multifidus.    Time  6    Period  Weeks    Status  New    Target Date  12/16/17        PT Long Term Goals - 11/04/17 1646      PT LONG TERM GOAL #1   Title  Patient will report no episodes of UI over the course of the prior two weeks to demonstrate improved functional ability.    Time  12    Period  Weeks    Status  Achieved    Target Date  07/15/17      PT LONG TERM GOAL #2   Title  Patient will report no pain with intercourse to demonstrate improved functional ability.    Time  12    Period  Weeks    Status  On-going    Target Date  01/27/18      PT LONG TERM GOAL #3   Title  Patient will score at or below 64 on the PFDI and 8/43 on the Female NIH-CPSI to demonstrate a clinically meaningful decrease in disability and distress due to pelvic floor dysfunction.    Baseline  NIH-CPSI:13/43, PFDI: 44/300 (4/30)    Time  12    Period   Weeks    Status  On-going    Target Date  01/27/18            Plan - 01/22/18 0951    Clinical Impression Statement  Pt. responded  well to all interventions today, demonstrating decreased pain and improved understanding of how she can continue to decrease and maintain decreased back and hip pain with increased exercise after D/C. Continue per POC.    Clinical Presentation  Stable    Clinical Decision Making  Moderate    Rehab Potential  Good    Clinical Impairments Affecting Rehab Potential  Hypothyroidism, depression, early menopause, obesity.    PT Frequency  Other (comment)    PT Duration  12 weeks    PT Treatment/Interventions  ADLs/Self Care Home Management;Biofeedback;Aquatic Therapy;Electrical Stimulation;Moist Heat;Traction;Functional mobility training;Therapeutic activities;Therapeutic exercise;Balance training;Neuromuscular re-education;Manual techniques;Patient/family education;Dry needling;Taping    PT Next Visit Plan  re-assess goals, re-check PFM and D/C    PT Home Exercise Plan  diaphragmatic breathing, adductor stretch, child's pose, lengthening kegels (3 sec long-holds), sit-to-stand, squat form, log-roll, soda-can, squatty potty , TA in quadruped, pelvic tilts on ball, shoulder retractions, chin-tucks, rows, towel-roll extensions., bird-dog arms, seated low back stretch    Consulted and Agree with Plan of Care  Patient       Patient will benefit from skilled therapeutic intervention in order to improve the following deficits and impairments:  Increased fascial restricitons, Improper body mechanics, Pain, Decreased coordination, Decreased mobility, Decreased scar mobility, Increased muscle spasms, Postural dysfunction, Decreased activity tolerance, Decreased endurance, Decreased range of motion, Decreased strength, Decreased balance, Obesity  Visit Diagnosis: Other muscle spasm  Other lack of coordination  Muscle weakness (generalized)  Myalgia     Problem  List Patient Active Problem List   Diagnosis Date Noted  . Monilial vaginitis 02/26/2017  . Vaginal dryness 02/26/2017  . Decreased libido 02/26/2017  . Anxiety with depression 02/26/2017  . Dyspareunia, female 02/26/2017  . S/P TAH (total abdominal hysterectomy) 07/02/2016  . 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 Nocturia 07/21/2015  . Stress incontinence 07/21/2015  . 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 Allergic rhinitis 06/21/2015  . Obesity (BMI 30-39.9) 06/21/2015   Cleophus Molt DPT, ATC Cleophus Molt 01/22/2018, 9:53 AM  [place] [facility] MAIN [facility] SERVICES [address] [place], [place], [address] Phone: [phone]   Fax:  [phone]  Name: Caitlin Barrett MRN: [id number] Date of Birth: May 01, 1970

## 2018-01-23 ENCOUNTER — Encounter: Payer: Self-pay | Admitting: Dietician

## 2018-01-23 ENCOUNTER — Encounter: Payer: PRIVATE HEALTH INSURANCE | Attending: Nurse Practitioner | Admitting: Dietician

## 2018-01-23 VITALS — Ht 69.0 in | Wt 278.9 lb

## 2018-01-23 DIAGNOSIS — Z6841 Body Mass Index (BMI) 40.0 and over, adult: Secondary | ICD-10-CM | POA: Insufficient documentation

## 2018-01-23 DIAGNOSIS — Z713 Dietary counseling and surveillance: Secondary | ICD-10-CM | POA: Insufficient documentation

## 2018-01-23 NOTE — Progress Notes (Signed)
Medical Nutrition Therapy: Visit start time: 1430  end time: 1545  Assessment:  Diagnosis: obesity Past medical history: hypothyroidism, peri-menopause, pelvic floor issues, costochondritis Psychosocial issues/ stress concerns: history of depression, not currently depressed  Preferred learning method:  . Auditory  . Visual . Hands-on   Current weight: 278.9lbs Height: 5'9" Medications, supplements: reconciled list in medical record  Progress and evaluation: Patient reports partial hysterectomy about 1 1/2 years ago, after which she began having menopausal symptoms, including depression, which led to increased eating-- particularly sweets, and subsequent weight gain. Has gained about 40-50lbs. Reports efforts to reduce carb intake and increase exercise, but has times when she does eat more and move less and then gains weight back.  She was eating multiple breakfast and lunch meals out, but is trying to eat those meals at home. She and husband eat supper out daily. Patient reports sensitivity to dairy foods/ lactose, and large portions of wheat bread.   Physical activity: walking stationary bike, or elliptical 30-45 minutes, 3 times a week + dog walking  Dietary Intake:  Usual eating pattern includes 3 meals and 1-2 snacks per day. Dining out frequency: 11-15 meals per week.  Breakfast: cereal or apple if early, or eggs with apple; egg biscuit occasionally Snack: apple or cereal or none Lunch: sometimes as late as 3pm due to work; makes pet visits during lunch hour -- sometimes takes cereal or almonds; sometimes cooks chicken in crock pot with steak sauce or ketchup + vegetables.  Snack: cheerios, fruit, carrots, popcorn, chocolate Supper: usually out 6-7:30pm -- salmon patties with veg, grilled chicken salad or sandwich; occasionally pizza or burger Snack: same as pm or none Beverages: water; lightly sweet tea, occasional regular soda does not like diet sodas  Nutrition Care  Education: Topics covered: weight control Basic nutrition: basic food groups, appropriate nutrient balance, appropriate meal and snack schedule, general nutrition guidelines; appropriate food choices with patient's food sensitivities.  Weight control: eating at regular intervals to avoid excessive hunger; appropriate food portions; roles of carbohydrate, protein, and fat; provided guidance for 1700kcal daily intake with 40% CHO, 30% protein, and 30% fat to meet patient's preferences; basic meal planning using plate method, food models Advanced nutrition: dining out Other: making healthy eating pattern sustainable with allowance for occasional treats and avoiding guilt feelings when having "bad" days  Nutritional Diagnosis:  [place]-3.3 Overweight/obesity As related to history of excess calories, hypothyroidism.  As evidenced by patient with current BMI of 40, and patient report of dietary history.  Intervention: Instruction as noted above.   Set goals with direction from patient.   Patient has been making dietary improvements already, and is motivated to continue.    She would like to track BMI and % body fat using InBody scale. Today's results indicate 49% body fat.  Education Materials given:  . Plate Planner with food lists . Sample meal pattern/ menus . Build a Pyramid skillet meal . Goals/ instructions . InBody body composition report  Learner/ who was taught:  . Patient   Level of understanding: Marland Kitchen Verbalizes/ demonstrates competency   Demonstrated degree of understanding via:   Teach back Learning barriers: . None  Willingness to learn/ readiness for change: . Eager, change in progress  Monitoring and Evaluation:  Dietary intake, exercise, and body weight      follow up: 02/13/18

## 2018-01-23 NOTE — Patient Instructions (Addendum)
   Plan to have something to eat every 3-5 hours during the day. If you will go longer than 5-6 hours between meals, include a snack with a small amount of healthy carb (fruit, whole grain cereal, graham crackers, or popcorn) along with protein (almonds, 1-2 Tbsp peanut butter, or low fat cheese).   Good job making healthy food choices in general!  Allow for 1-2 "treat" meals in a week.   Use food lists and menus to prepare and eat balanced meals.

## 2018-01-28 ENCOUNTER — Ambulatory Visit: Payer: PRIVATE HEALTH INSURANCE | Attending: Nurse Practitioner

## 2018-01-28 ENCOUNTER — Other Ambulatory Visit: Payer: Self-pay | Admitting: Nurse Practitioner

## 2018-01-28 DIAGNOSIS — M62838 Other muscle spasm: Secondary | ICD-10-CM | POA: Diagnosis present

## 2018-01-28 DIAGNOSIS — R278 Other lack of coordination: Secondary | ICD-10-CM | POA: Insufficient documentation

## 2018-01-28 DIAGNOSIS — M791 Myalgia, unspecified site: Secondary | ICD-10-CM | POA: Diagnosis present

## 2018-01-28 DIAGNOSIS — M6281 Muscle weakness (generalized): Secondary | ICD-10-CM

## 2018-01-28 DIAGNOSIS — R0789 Other chest pain: Secondary | ICD-10-CM

## 2018-01-28 NOTE — Therapy (Signed)
[place] [facility] SERVICES [address] [place], [place], [address] Phone: [phone]   Fax:  [phone]  Physical Therapy Treatment  Patient Details  Name: Caitlin Barrett MRN: [id number] Date of Birth: 02-20-1971 Referring Provider (PT): Cassell Smiles   Encounter Date: 01/28/2018  PT End of Session - 01/29/18 1005    Visit Number  25    Number of Visits  25    Date for PT Re-Evaluation  01/27/18    PT Start Time  1600    PT Stop Time  1700    PT Time Calculation (min)  60 min    Activity Tolerance  Patient tolerated treatment well;No increased pain    Behavior During Therapy  WFL for tasks assessed/performed       Past Medical History:  Diagnosis Date  . Allergy    seasonal.  Takes allergy shots at [facility]  . Anemia   . Anxiety   . Arthritis   . Asthma    exercise induced--no inhalers  . Cancer ([place]) 2010   Basal cell skin cancer Lt eye, skin graft above Lt eye  . Depression   . Dysmenorrhea   . Headache   . Hypothyroid    Nodules and Fine needle aspiration  . IBS (irritable bowel syndrome)   . Painful menstrual periods   . Plantar fasciitis   . Sprain of carpal (joint) of wrist     Past Surgical History:  Procedure Laterality Date  . ABDOMINAL HYSTERECTOMY Bilateral 07/02/2016   Procedure: HYSTERECTOMY ABDOMINAL WITH BILATERAL SALPINGECTOMY-MIDLINE INCISION;  Surgeon: Brayton Mars, MD;  Location: [facility] ORS;  Service: Gynecology;  Laterality: Bilateral;  . CHOLECYSTECTOMY  2009  . HERNIA REPAIR  5465   umbilical  . SKIN GRAFT  2010  . TONSILLECTOMY      There were no vitals filed for this visit.    Pelvic Floor Physical Therapy Treatment Note  SCREENING  Changes in medications, allergies, or medical history?: no    SUBJECTIVE  Patient reports: Had an allergic reaction to the adhesive in the kinesiotape but felt that it was helpful and her back has not been hurting her. Her hip has given a  little bit of pain but stretching has helped. Has been able to sleep through the night sometimes or getting up just once at night.  Pain update:  Location of pain: R hip Current pain:  1/10  Max pain:  4/10 Least pain:  0/10 Nature of pain: tight/achy  Patient Goals: No Pain with Intercourse, maintaining improvement in urinary symptoms while increasing water intake to decrease constipation. No return of pelvic pain.   OBJECTIVE  Changes in: Posture/Observations:  Pt. Demonstrates ~ 90% improved posture with no cueing today and understands how to use tape to help her continue to improve upon this at home, needs to find tape that does not react to her skin for longer term use.  Range of Motion/Flexibilty:  Slightly decreased mobility surrounding the sacrum with pain at R>L sacral border and improved following treatment.    Pelvic floor: Pt. Has only had breif and minimal pain with intercourse lately and demonstrates understanding of how to perform self-release PRN for long-term management. Her obesity and muscular imbalance are increasing the time needed to see full resolution once adequate strength has been achieved in the deep-core.  Palpation: TTP to lumbar paraspinals and QL ~ 50% less than prior.  INTERVENTIONS THIS SESSION: Self-care: reviewed bladder irritants and normal bladder  [place] [facility] SERVICES [address] [place], [place], [address] Phone: [phone]   Fax:  [phone]  Physical Therapy Treatment  Patient Details  Name: Caitlin Barrett MRN: [id number] Date of Birth: 02-20-1971 Referring Provider (PT): Cassell Smiles   Encounter Date: 01/28/2018  PT End of Session - 01/29/18 1005    Visit Number  25    Number of Visits  25    Date for PT Re-Evaluation  01/27/18    PT Start Time  1600    PT Stop Time  1700    PT Time Calculation (min)  60 min    Activity Tolerance  Patient tolerated treatment well;No increased pain    Behavior During Therapy  WFL for tasks assessed/performed       Past Medical History:  Diagnosis Date  . Allergy    seasonal.  Takes allergy shots at [facility]  . Anemia   . Anxiety   . Arthritis   . Asthma    exercise induced--no inhalers  . Cancer ([place]) 2010   Basal cell skin cancer Lt eye, skin graft above Lt eye  . Depression   . Dysmenorrhea   . Headache   . Hypothyroid    Nodules and Fine needle aspiration  . IBS (irritable bowel syndrome)   . Painful menstrual periods   . Plantar fasciitis   . Sprain of carpal (joint) of wrist     Past Surgical History:  Procedure Laterality Date  . ABDOMINAL HYSTERECTOMY Bilateral 07/02/2016   Procedure: HYSTERECTOMY ABDOMINAL WITH BILATERAL SALPINGECTOMY-MIDLINE INCISION;  Surgeon: Brayton Mars, MD;  Location: [facility] ORS;  Service: Gynecology;  Laterality: Bilateral;  . CHOLECYSTECTOMY  2009  . HERNIA REPAIR  5465   umbilical  . SKIN GRAFT  2010  . TONSILLECTOMY      There were no vitals filed for this visit.    Pelvic Floor Physical Therapy Treatment Note  SCREENING  Changes in medications, allergies, or medical history?: no    SUBJECTIVE  Patient reports: Had an allergic reaction to the adhesive in the kinesiotape but felt that it was helpful and her back has not been hurting her. Her hip has given a  little bit of pain but stretching has helped. Has been able to sleep through the night sometimes or getting up just once at night.  Pain update:  Location of pain: R hip Current pain:  1/10  Max pain:  4/10 Least pain:  0/10 Nature of pain: tight/achy  Patient Goals: No Pain with Intercourse, maintaining improvement in urinary symptoms while increasing water intake to decrease constipation. No return of pelvic pain.   OBJECTIVE  Changes in: Posture/Observations:  Pt. Demonstrates ~ 90% improved posture with no cueing today and understands how to use tape to help her continue to improve upon this at home, needs to find tape that does not react to her skin for longer term use.  Range of Motion/Flexibilty:  Slightly decreased mobility surrounding the sacrum with pain at R>L sacral border and improved following treatment.    Pelvic floor: Pt. Has only had breif and minimal pain with intercourse lately and demonstrates understanding of how to perform self-release PRN for long-term management. Her obesity and muscular imbalance are increasing the time needed to see full resolution once adequate strength has been achieved in the deep-core.  Palpation: TTP to lumbar paraspinals and QL ~ 50% less than prior.  INTERVENTIONS THIS SESSION: Self-care: reviewed bladder irritants and normal bladder  distress due to pelvic floor dysfunction.    Baseline  NIH-CPSI:13/43, PFDI: 44/300 (4/30) NIH-CPSI:6/43, PFDI: 7/300 (2%)  (Pended)     Time  12    Period  Weeks    Status  Achieved  (Pended)     Target Date  01/27/18            Plan - 01/29/18 1005    Clinical Impression Statement  Pt. responded well to all interventions today, demonstratind improved mobility and decreased pain with pressure around all sacral borders following treatment. She has met all but two goals, first not having pain with intercourse, but reports being >80% improved and second having no TTP through lumbar region which she reports is >50% imptoved and she has continued to see improvement with HEP use lately. We expect she will continue to see improvement and sustinence of decreased symptoms with use of HEP following D/C. We will D/C at this time to HEP.     Clinical Presentation  Stable    Clinical Decision Making  Moderate    Rehab Potential  Good    Clinical Impairments Affecting Rehab Potential  Hypothyroidism, depression, early menopause, obesity.    PT Frequency  Other (comment)    PT Duration  12 weeks    PT Treatment/Interventions  ADLs/Self Care Home Management;Biofeedback;Aquatic Therapy;Electrical Stimulation;Moist Heat;Traction;Functional mobility training;Therapeutic activities;Therapeutic exercise;Balance training;Neuromuscular re-education;Manual techniques;Patient/family education;Dry needling;Taping    PT Next Visit Plan  D/C    PT Home Exercise Plan  diaphragmatic breathing, adductor stretch, child's pose, lengthening kegels (3 sec long-holds), sit-to-stand, squat form, log-roll, soda-can, squatty potty , TA in quadruped, pelvic tilts on ball, shoulder retractions,  chin-tucks, rows, towel-roll extensions., bird-dog arms, seated low back stretch    Consulted and Agree with Plan of Care  Patient       Patient will benefit from skilled therapeutic intervention in order to improve the following deficits and impairments:  Increased fascial restricitons, Improper body mechanics, Pain, Decreased coordination, Decreased mobility, Decreased scar mobility, Increased muscle spasms, Postural dysfunction, Decreased activity tolerance, Decreased endurance, Decreased range of motion, Decreased strength, Decreased balance, Obesity  Visit Diagnosis: Other muscle spasm  Other lack of coordination  Muscle weakness (generalized)  Myalgia     Problem List Patient Active Problem List   Diagnosis Date Noted  . Monilial vaginitis 02/26/2017  . Vaginal dryness 02/26/2017  . Decreased libido 02/26/2017  . Anxiety with depression 02/26/2017  . Dyspareunia, female 02/26/2017  . S/P TAH (total abdominal hysterectomy) 07/02/2016  . 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 Nocturia 07/21/2015  . Stress incontinence 07/21/2015  . 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 Allergic rhinitis 06/21/2015  . Obesity (BMI 30-39.9) 06/21/2015   Willa Rough DPT, ATC Willa Rough 01/29/2018, 10:36 AM  [place] [facility] SERVICES [address] [place], [place], [address] Phone: [phone]   Fax:  [phone]  Name: Caitlin Barrett MRN: [id number] Date of Birth: 1970/07/30

## 2018-02-05 ENCOUNTER — Ambulatory Visit
Admission: RE | Admit: 2018-02-05 | Discharge: 2018-02-05 | Disposition: A | Discharge status: Home / Self Care | Payer: PRIVATE HEALTH INSURANCE | Source: Ambulatory Visit | Attending: Nurse Practitioner | Admitting: Nurse Practitioner

## 2018-02-05 DIAGNOSIS — Z1231 Encounter for screening mammogram for malignant neoplasm of breast: Secondary | ICD-10-CM

## 2018-02-13 ENCOUNTER — Encounter: Payer: Self-pay | Admitting: Dietician

## 2018-02-13 ENCOUNTER — Encounter: Payer: PRIVATE HEALTH INSURANCE | Attending: Nurse Practitioner | Admitting: Dietician

## 2018-02-13 VITALS — Ht 69.0 in | Wt 273.4 lb

## 2018-02-13 DIAGNOSIS — Z713 Dietary counseling and surveillance: Secondary | ICD-10-CM | POA: Insufficient documentation

## 2018-02-13 DIAGNOSIS — Z6841 Body Mass Index (BMI) 40.0 and over, adult: Secondary | ICD-10-CM | POA: Diagnosis not present

## 2018-02-13 NOTE — Patient Instructions (Signed)
   Keep up the healthy food choices and habits, great job!  Increase water intake by using strategies to make it convenient and easy. Daily fluid goal is an average of 64oz from all fluids.  Continue to find ways to incorporate low-carb veggies into other foods and salads, experiment with different ways of seasoning or cooking for differences in taste, texture.   Can try using Calorieking.com as a way to look up nutrition info for specific foods.   Try light hand weights or strength/ toning bands to add strength-building exercise 2-3 times a week.

## 2018-02-13 NOTE — Progress Notes (Signed)
Medical Nutrition Therapy: Visit start time: 1440  end time: 1515  Assessment:  Diagnosis: obesity Medical history changes: no changes Psychosocial issues/ stress concerns: none  Current weight: 273.4lbs  Height: 5'9" Medications, supplement changes: no changes  Progress and evaluation: weight loss of 5.5lbs since previous visit; patient has increased fruit intake and some vegetables, has switched to whole grain bread and lower sugar milk. She has also worked to [employer] portions. Freezes leftovers for future meals. She would like to be able to better estimate calories and nutrition in crock-pot or one-dish meals.   Physical activity: walking challenge to walk 5k as often as possible, has done 7 in past 10 days  Dietary Intake:  Usual eating pattern includes 3 meals and 2-3 snacks per day. Dining out frequency: not assessed today.  Breakfast: protein shake + 1/2 apple Snack: eggs, toast, remainder of apple Lunch: meat and veg from corck pot with rice (brown and wild rice blend) and salad or sandwich with 1 slice arnold bread Snack: fruit or sargento snack pack cheese, nuts, fruit (usu eats 1/2) Supper: meat, veg, starch -- choosing low fat foods and limiting added fats Snack: occasionally snack size twix bar or fruit ie grapes or blueberries with sm amount whipped cream Beverages: water, lightly sweetened tea, maybe 2 sodas since previous visit  Nutrition Care Education: Topics covered:  Weight control: reviewed progress since previous visit; strategies for increasing low-carb vegetables; adding strength building exercise Advanced nutrition: cooking techniques for vegetables; determining nutritional content of foods and recipes Other:  Daily fluid goal  Nutritional Diagnosis:  -3.3 Overweight/obesity As related to history of excess calories; hypthyroidism.  As evidenced by patient with current BMI of 40.4, working on diet and lifestyle changes for ongoing weight  loss.  Intervention: Instruction/ discussion as noted above.   Patient is working diligently to make lifestyle changes and is motivated to continue.    She has goals to further improve diet and physical activity.      Education Materials given:  Marland Kitchen Exercise booklet (ADA) . Goals/ instructions   Learner/ who was taught:  . Patient   Level of understanding: Marland Kitchen Verbalizes/ demonstrates competency   Demonstrated degree of understanding via:   Teach back Learning barriers: . None  Willingness to learn/ readiness for change: . Eager, change in progress  Monitoring and Evaluation:  Dietary intake, exercise, and body weight      follow up: 03/14/18

## 2018-03-05 ENCOUNTER — Other Ambulatory Visit: Payer: Self-pay | Admitting: Surgical

## 2018-03-05 MED ORDER — ESTRADIOL 1 MG PO TABS: 1.0000 mg | ORAL_TABLET | Freq: Every day | ORAL | 1 refills | Status: DC

## 2018-03-14 ENCOUNTER — Ambulatory Visit: Payer: PRIVATE HEALTH INSURANCE | Admitting: Dietician

## 2018-03-31 ENCOUNTER — Telehealth: Payer: Self-pay | Admitting: Dietician

## 2018-03-31 NOTE — Telephone Encounter (Signed)
Called patient to reschedule her missed appointment from 03/14/18; left a message for her to call back.

## 2018-04-09 ENCOUNTER — Encounter: Payer: Self-pay | Admitting: Obstetrics and Gynecology

## 2018-04-09 ENCOUNTER — Ambulatory Visit: Payer: PRIVATE HEALTH INSURANCE | Admitting: Obstetrics and Gynecology

## 2018-04-09 VITALS — BP 120/63 | HR 65 | Ht 68.0 in | Wt 271.6 lb

## 2018-04-09 DIAGNOSIS — R232 Flushing: Secondary | ICD-10-CM

## 2018-04-09 DIAGNOSIS — N898 Other specified noninflammatory disorders of vagina: Secondary | ICD-10-CM

## 2018-04-09 DIAGNOSIS — G479 Sleep disorder, unspecified: Secondary | ICD-10-CM | POA: Diagnosis not present

## 2018-04-09 DIAGNOSIS — E2839 Other primary ovarian failure: Secondary | ICD-10-CM

## 2018-04-09 DIAGNOSIS — Z6841 Body Mass Index (BMI) 40.0 and over, adult: Secondary | ICD-10-CM

## 2018-04-09 DIAGNOSIS — Z79890 Hormone replacement therapy (postmenopausal): Secondary | ICD-10-CM

## 2018-04-09 DIAGNOSIS — Z5181 Encounter for therapeutic drug level monitoring: Secondary | ICD-10-CM

## 2018-04-09 NOTE — Patient Instructions (Signed)

## 2018-04-09 NOTE — Progress Notes (Signed)
Subjective:     Patient ID: Caitlin Barrett, female   DOB: 09-Mar-1971, 48 y.o.   MRN: [id number]  HPI Here to discuss menopausal symptoms with the most pressing being decreased sex drive and easily irritated. She had a partial hysterectomy 2 years ago with no complications. Started on estradiol pills and wellbutrin. Estrogen helped a lot initially but hot flashes have returned occasionally. Having mood swings daily. Stopped the wellbutrin after 4 months. Didn't feel like she needed it anymore. Moods swings are generally with anger and feeling easily agitated. Occasional times she doesn't want to get out of bed in the mornings (most recently with the holidays which she states are hard).  She has been exercising with little weight loss. Has bilateral hip pain in the evenings. Worse when she lays down. Takes motrin with some relief.    Has no sex drive and occasional vaginal dryness. Has been using coconut oil and vit E oil capsules and it is helping. Also did pelvic floor PT for 7 months ago and feels like it helped. Desires having hormone levels checked to see if she needs a higher dose of estrogen. She has been married for >20 years and states she is in a good marriage. That they both have had health issues over the last few years that have affected their sex life. But they try to have sex on a regular basis. Using a lubricant does help.   She has trouble falling asleep and wakes up about every 2 hours. Feels tired in the mornings and fatigue throughout the day.  Unsure ow long, but states it has been a long time since she felt like she got a full/good nights sleep. Does take melatonin gummies and they help. Also has tried sleepy time tea and chamomile tea.   States thyroid levels and nodules are now stable and is stopping medication per endocrinologist. (saw him last week)  Review of Systems  Constitutional: Positive for fatigue.  Allergic/Immunologic: Positive for environmental allergies and food  allergies.  Psychiatric/Behavioral: Positive for agitation and sleep disturbance.  All other systems reviewed and are negative.      Objective:   Physical Exam A&Ox4 Well groomed female in no distress Blood pressure 120/63, pulse 65, height 5\' 8"  (1.727 m), weight 271 lb 9.6 oz (123.2 kg), last menstrual period 05/26/2016. Body mass index is 41.3 kg/m.  Pelvic exam: VULVA: normal appearing vulva with no masses, tenderness or lesions, VAGINA: normal appearing vagina with normal color and discharge, no lesions.     Assessment:     Vaginal dryness Decreased libido Mood swings Sleep disturbance BMI 41 S/p partial hysterectomy due to fibroids     Plan:     Will return next week in am for lab draw. Plan on trial of prometrium 200mg  at bedtime for 3 weeks of every month as discussed. Is symptoms not greatly improved after 2 months will consider SSRI.  Counseled at length regarding menopause, hormone replacement therapy risks and benefits, normal sex drive expectations, healthy weight and exercise and follow up.  RTC in 6- 8 weeks after starting progesterone. Or as needed.   >50% of 30 minute visit spent in counseling.  Garnell Begeman,CNM

## 2018-04-16 ENCOUNTER — Other Ambulatory Visit: Payer: PRIVATE HEALTH INSURANCE

## 2018-04-17 LAB — ESTRADIOL: ESTRADIOL: 29.2 pg/mL

## 2018-04-17 LAB — TESTOSTERONE, FREE, TOTAL, SHBG
SEX HORMONE BINDING: 35.4 nmol/L (ref 24.6–122.0)
Testosterone, Free: 1.2 pg/mL (ref 0.0–4.2)
Testosterone: 8 ng/dL (ref 8–48)

## 2018-04-17 LAB — PROGESTERONE: Progesterone: <0.1 ng/mL

## 2018-04-17 LAB — CORTISOL: Cortisol: 13.6 ug/dL

## 2018-04-22 ENCOUNTER — Encounter: Payer: Self-pay | Admitting: Family Medicine

## 2018-04-22 ENCOUNTER — Ambulatory Visit: Payer: PRIVATE HEALTH INSURANCE | Admitting: Family Medicine

## 2018-04-22 ENCOUNTER — Other Ambulatory Visit: Payer: Self-pay

## 2018-04-22 ENCOUNTER — Ambulatory Visit
Admission: RE | Admit: 2018-04-22 | Discharge: 2018-04-22 | Disposition: A | Discharge status: Home / Self Care | Payer: PRIVATE HEALTH INSURANCE | Source: Ambulatory Visit | Attending: Family Medicine | Admitting: Family Medicine

## 2018-04-22 VITALS — BP 143/66 | HR 118 | Temp 101.7°F | Resp 16 | Ht 68.0 in | Wt 270.4 lb

## 2018-04-22 DIAGNOSIS — J9801 Acute bronchospasm: Secondary | ICD-10-CM

## 2018-04-22 DIAGNOSIS — J189 Pneumonia, unspecified organism: Secondary | ICD-10-CM

## 2018-04-22 DIAGNOSIS — J45990 Exercise induced bronchospasm: Secondary | ICD-10-CM

## 2018-04-22 DIAGNOSIS — J111 Influenza due to unidentified influenza virus with other respiratory manifestations: Secondary | ICD-10-CM | POA: Diagnosis not present

## 2018-04-22 DIAGNOSIS — J181 Lobar pneumonia, unspecified organism: Secondary | ICD-10-CM | POA: Diagnosis not present

## 2018-04-22 MED ORDER — IPRATROPIUM BROMIDE 0.06 % NA SOLN: 2.0000 | Freq: Four times a day (QID) | NASAL | 0 refills | Status: DC

## 2018-04-22 MED ORDER — PSEUDOEPH-BROMPHEN-DM 30-2-10 MG/5ML PO SYRP: 5.0000 mL | ORAL_SOLUTION | Freq: Three times a day (TID) | ORAL | 0 refills | Status: DC | PRN

## 2018-04-22 MED ORDER — LEVOFLOXACIN 750 MG PO TABS: 750.0000 mg | ORAL_TABLET | Freq: Every day | ORAL | 0 refills | Status: DC

## 2018-04-22 MED ORDER — PREDNISONE 50 MG PO TABS
50.0000 mg | ORAL_TABLET | Freq: Every day | ORAL | 0 refills | Status: DC
Start: 2018-04-22 — End: 2018-06-19

## 2018-04-22 NOTE — Patient Instructions (Addendum)
Thank you for coming to the office today.  Due to recent diagnosis of Flu at [facility] - I agree it needs to be treated with continued Tamiflu - finish your course.  CHECK Chest X-ray today, stay tuned for results by or after 5pm  - Wash hands and cover cough very well to avoid spread of infection - For symptom control: - Start Prednisone 29m daily for 5 days with food for asthma      - Take Ibuprofen / Advil 6047mevery 6-8 hours as needed for fever / muscle aches, and may also take Tylenol [phone] dose every 6-8 hours or 3 times a day, can alternate dosing - Rx Cough Syrup, no codeine      - Start Atrovent nasal spray decongestant 2 sprays in each nostril up to 4 times daily for 7 days      - Start OTC Mucinex (regular) for cough and congestion for up to 7 days - Improve hydration with plenty of clear fluids  If significant worsening with poor fluid intake, worsening fever, difficulty breathing due to coughing, worsening body aches, weakness, or other more concerning symptoms difficulty breathing you can seek treatment at Emergency Department. Also if improved flu symptoms and then worsening days to week later with concerns for bronchitis, productive cough fever chills again we may need to check for possible pneumonia that can occur after the flu   Please schedule a Follow-up Appointment to: Return in about 1 week (around 04/29/2018), or if symptoms worsen or fail to improve, for flu / asthma .  If you have any other questions or concerns, please feel free to call the office or send a message through [facility]CPortalou may also schedule an earlier appointment if necessary.  Additionally, you may be receiving a survey about your experience at our office within a few days to 1 week by e-mail or mail. We value your feedback.  AleNobie PutnamO SouOlmitz

## 2018-04-22 NOTE — Progress Notes (Signed)
Subjective:    Patient ID: MOVITA OHRT, female    DOB: 1970-11-11, 48 y.o.   MRN: [id number]  Caitlin Barrett is a 48 y.o. female presenting on 04/22/2018 for Influenza (went to [facility] tes[facility] for flu and was given Oseltamivir 75mg  since Sunday, more coughing with green stuff coming up, chills and fever. Just not getting any better)  Patient presents for a same day appointment.  PCP Wilhelmina Mcardle, AGPCNP-BC  HPI   LEFT LOWER LOBE PNEUMONIA / INFLUENZA Reports symptoms onset about 5-7 days ago with URI congestion and cough. Her husband had URI symptoms previous and thought that he may have been sick contact. Then worsening significantly over weekend, had fevers, body aches, and chills, worse into weekend, she went to [facility] Sunday 1/26 and had testing that was initially negative for influenza, and she was treated clinically based on symptoms for influenza. - Currently seems similar to onset symptoms, possibly worse but her cough is improved, still fever - Taking Tamiflu 75mg  BID since Sunday 1/26, and taking Tylenol / Acetaminophen (cold and flu) and alternating ibuprofen, fever returns after 4-5 hours. - Admits persistent elevated fever, anti pyretic does not seem to last. Chills - Admits hoarse voice with sore throat - Admits persistent coughing spells, tight breathing - Admits some headache on Sunday evening and L ear bothering her with some pain - Admits body aches - Admits occasional nausea without vomiting - Denies sinus pressure, diarrhea, rash, dizzy lightheaded  Health Maintenance: UTD Flu vaccine 01/16/18  Depression screen [facility] 2/9 04/22/2018 01/23/2018 02/26/2017  Decreased Interest 0 0 2  Down, Depressed, Hopeless 0 0 3  PHQ - 2 Score 0 0 5  Altered sleeping 1 - 3  Tired, decreased energy 1 - 3  Change in appetite 0 - 3  Feeling bad or failure about yourself  0 - 3  Trouble concentrating 0 - 2  Moving slowly or fidgety/restless 0 - 2  Suicidal  thoughts 0 - 0  PHQ-9 Score 2 - 21  Difficult doing work/chores Somewhat difficult - -    Social History   Tobacco Use  . Smoking status: Never Smoker  . Smokeless tobacco: Never Used  Substance Use Topics  . Alcohol use: Not Currently    Comment: rare  . Drug use: No    Review of Systems Per HPI unless specifically indicated above     Objective:    BP (!) 143/66   Pulse (!) 118   Temp (!) 101.7 F (38.7 C) (Oral)   Resp 16   Ht 5\' 8"  (1.727 m)   Wt 270 lb 6.4 oz (122.7 kg)   LMP 05/26/2016 (Exact Date)   SpO2 98%   BMI 41.11 kg/m   Wt Readings from Last 3 Encounters:  04/22/18 270 lb 6.4 oz (122.7 kg)  04/09/18 271 lb 9.6 oz (123.2 kg)  02/13/18 273 lb 6.4 oz (124 kg)    Physical Exam Vitals signs and nursing note reviewed.  Constitutional:      General: She is not in acute distress.    Appearance: She is well-developed. She is not diaphoretic.     Comments: Mildly ill and tired appearing, comfortable, cooperative, frequent cough  HENT:     Head: Normocephalic and atraumatic.     Comments: Frontal / maxillary sinuses non-tender. Nares patent with congestion and some turbinate edema, no purulence. Bilateral TMs clear without erythema, effusion or bulging. Oropharynx mild irritation with non focal erythema and  post nasal drainage without  exudates, edema or asymmetry. Eyes:     General:        Right eye: No discharge.        Left eye: No discharge.     Conjunctiva/sclera: Conjunctivae normal.  Neck:     Musculoskeletal: Normal range of motion and neck supple.     Thyroid: No thyromegaly.  Cardiovascular:     Rate and Rhythm: Normal rate and regular rhythm.     Heart sounds: Normal heart sounds. No murmur.  Pulmonary:     Effort: Pulmonary effort is normal. No respiratory distress.     Breath sounds: Wheezing and rhonchi present. No rales.     Comments: Frequent cough. Mild reduced air movement. Musculoskeletal: Normal range of motion.  Lymphadenopathy:      Cervical: No cervical adenopathy.  Skin:    General: Skin is warm and dry.     Findings: No erythema or rash.  Neurological:     Mental Status: She is alert and oriented to person, place, and time.  Psychiatric:        Behavior: Behavior normal.     Comments: Well groomed, good eye contact, normal speech and thoughts      I have personally reviewed the radiology report from [facility] Chest X-ray on 04/22/18.   CLINICAL DATA:  + flu Sunday, Green productive cough since Thursday, [place], wheezing, fever, h/o pneumonia in Jan 2019, non smoker, exercise induced asthma, h/o skin graft around anterior right shoulder, no other lung/heart history or surgery per pt  EXAM: CHEST - 2 VIEW  COMPARISON:  10/07/2017  FINDINGS: There is focal consolidation in the left lower lobe, superior segment, consistent with pneumonia. Small amount of additional opacity is noted in the anterior lung base on the lateral view, which may reflect additional pneumonia or atelectasis. Remainder of the lungs is clear. No pleural effusion or pneumothorax.  Cardiac silhouette is normal in size. No mediastinal or hilar masses or convincing adenopathy.  Skeletal structures are intact.  IMPRESSION: 1. Left lower lobe, superior segment, pneumonia.   Electronically Signed   By: Amie Portland M.D.   On: 04/22/2018 15:20      Assessment & Plan:   Problem List Items Addressed This Visit    Exercise-induced asthma   Relevant Medications   predniSONE (DELTASONE) 50 MG tablet    Other Visit Diagnoses    Pneumonia of left lower lobe due to infectious organism (HCC)    -  Primary   Relevant Medications   oseltamivir (TAMIFLU) 75 MG capsule   brompheniramine-pseudoephedrine-DM 30-2-10 MG/5ML syrup   ipratropium (ATROVENT) 0.06 % nasal spray   levofloxacin (LEVAQUIN) 750 MG tablet   Influenza       Relevant Medications   oseltamivir (TAMIFLU) 75 MG capsule   ipratropium (ATROVENT) 0.06 % nasal spray     Other Relevant Orders   DG Chest 2 View (Completed)   Cough due to bronchospasm       Relevant Medications   predniSONE (DELTASONE) 50 MG tablet   brompheniramine-pseudoephedrine-DM 30-2-10 MG/5ML syrup   Other Relevant Orders   DG Chest 2 View (Completed)     Clinically diagnosed influenza despite [facility], concern for flu still due to significant known exposure to URI possible flu and her concerning constellation of symptoms despite flu vaccine already this season. - Duration x >5-7 days. Tolerating PO and well hydrated - Concern possible complication with pneumonia based on  worsening and progression of symptoms. Also coarse breathing with history of exercise induced asthma - S/p influenza vaccine this season  Plan: - Check [facility] Chest X-ray today - FINISH current Tamiflu 2-3 more days - Start Prednisone 50mg  daily burst x 5 days for cough / bronchospasm - Start Atrovent nasal spray decongestant 2 sprays in each nostril up to 4 times daily for 7 days - Start bromphed cough syrup PRN - May try Zofran PRN nausea if worsens - offered today - Hydration, alternate anti pyetric Tylenol / NSAID Return criteria given if significant worsening, consider post-influenza complications, otherwise follow-up if needed  **Update after visit - reviewed Chest X-ray, identified LLL consolidation concerning for PNA - consider dx CAP - called patient review, now start levaquin 750mg  daily x 7 days for antibiotic course in addition to above therapy**  Again return precautions given in setting of flu / pneumonia. When to go to hospital if indicated   Meds ordered this encounter  Medications  . predniSONE (DELTASONE) 50 MG tablet    Sig: Take 1 tablet (50 mg total) by mouth daily with breakfast.    Dispense:  5 tablet    Refill:  0  . brompheniramine-pseudoephedrine-DM 30-2-10 MG/5ML syrup    Sig: Take 5 mLs by mouth 3 (three) times daily as needed.     Dispense:  118 mL    Refill:  0  . ipratropium (ATROVENT) 0.06 % nasal spray    Sig: Place 2 sprays into both nostrils 4 (four) times daily. For up to 5-7 days then stop.    Dispense:  15 mL    Refill:  0  . levofloxacin (LEVAQUIN) 750 MG tablet    Sig: Take 1 tablet (750 mg total) by mouth daily. For 7 days    Dispense:  7 tablet    Refill:  0      Follow up plan: Return in about 1 week (around 04/29/2018), or if symptoms worsen or fail to improve, for flu / asthma .   Saralyn Pilar, DO [facility] 04/22/2018, 2:32 PM

## 2018-04-23 ENCOUNTER — Other Ambulatory Visit: Payer: Self-pay | Admitting: Obstetrics and Gynecology

## 2018-04-23 MED ORDER — PROGESTERONE MICRONIZED 200 MG PO CAPS: 200.0000 mg | ORAL_CAPSULE | Freq: Every day | ORAL | 6 refills | Status: DC

## 2018-04-24 ENCOUNTER — Other Ambulatory Visit: Payer: Self-pay | Admitting: Obstetrics and Gynecology

## 2018-04-24 MED ORDER — ESTRADIOL 0.5 MG PO TABS: 0.5000 mg | ORAL_TABLET | Freq: Every day | ORAL | 11 refills | Status: DC

## 2018-05-02 ENCOUNTER — Other Ambulatory Visit: Payer: Self-pay | Admitting: Nurse Practitioner

## 2018-05-02 DIAGNOSIS — R0789 Other chest pain: Secondary | ICD-10-CM

## 2018-05-27 ENCOUNTER — Other Ambulatory Visit: Payer: Self-pay | Admitting: Obstetrics and Gynecology

## 2018-05-27 NOTE — Telephone Encounter (Signed)
Caitlin Barrett has already filled this prescription.

## 2018-05-28 ENCOUNTER — Other Ambulatory Visit: Payer: Self-pay | Admitting: Obstetrics and Gynecology

## 2018-06-10 ENCOUNTER — Other Ambulatory Visit: Payer: Self-pay | Admitting: Obstetrics and Gynecology

## 2018-06-10 MED ORDER — ESTRADIOL 1 MG PO TABS: 1.0000 mg | ORAL_TABLET | Freq: Every day | ORAL | 10 refills | Status: DC

## 2018-06-11 ENCOUNTER — Telehealth: Payer: PRIVATE HEALTH INSURANCE | Admitting: Family

## 2018-06-11 DIAGNOSIS — R059 Cough, unspecified: Secondary | ICD-10-CM

## 2018-06-11 DIAGNOSIS — R05 Cough: Secondary | ICD-10-CM

## 2018-06-11 DIAGNOSIS — R6889 Other general symptoms and signs: Secondary | ICD-10-CM

## 2018-06-11 NOTE — Progress Notes (Signed)
E-Visit for Corona Virus Screening Based on your current symptoms, it seems like  your symptoms could related to the [place] virus.   Very important that you self isolate yourself for the next 14 days and avoid all contact with anyone over the age of 50 years or older!!!   Lots of rest, force fluids, good hand hygiene and tylenol or motrin as needed.   Approximately 5 minutes was spent documenting and reviewing patient's chart.    Coronavirus disease 2019 (COVID-19) is a respiratory illness that can spread from person to person. The virus that causes COVID-19 is a new virus that was first identified in the country of [place] but is now found in multiple other countries and has spread to the [place].  Symptoms associated with the virus are mild to severe fever, cough, and shortness of breath. There is currently no vaccine to protect against COVID-19, and there is no specific antiviral treatment for the virus.  It is vitally important that if you feel that you have an infection such as this virus or any other virus that you stay home and away from places where you may spread it to others.  Currently, not all patients are being tested. If the symptoms are mild and there is not a known exposure, performing the test is not indicated.    Reduce your risk of any infection by using the same precautions used for avoiding the common cold or flu:  Marland Kitchen Wash your hands often with soap and warm water for at least 20 seconds.  If soap and water are not readily available, use an alcohol-based hand sanitizer with at least 60% alcohol.  . If coughing or sneezing, cover your mouth and nose by coughing or sneezing into the elbow areas of your shirt or coat, into a tissue or into your sleeve (not your hands). . Avoid shaking hands with others and consider head nods or verbal greetings only. . Avoid touching your eyes, nose, or mouth with unwashed hands.  . Avoid close contact with people who are sick. . Avoid  places or events with large numbers of people in one location, like concerts or sporting events. . Carefully consider travel plans you have or are making. . If you are planning any travel outside or inside the [place], visit the CDC's Travelers' Health webpage for the latest health notices. . If you have some symptoms but not all symptoms, continue to monitor at home and seek medical attention if your symptoms worsen. . If you are having a medical emergency, call 911.  HOME CARE . Only take medications as instructed by your medical team. . Drink plenty of fluids and get plenty of rest. . A steam or ultrasonic humidifier can help if you have congestion.   GET HELP RIGHT AWAY IF: . You develop worsening fever. . You become short of breath . You cough up blood. . Your symptoms become more severe MAKE SURE YOU   Understand these instructions.  Will watch your condition.  Will get help right away if you are not doing well or get worse.  Your e-visit answers were reviewed by a board certified advanced clinical practitioner to complete your personal care plan.  Depending on the condition, your plan could have included both over the counter or prescription medications.  If there is a problem please reply once you have received a response from your provider. Your safety is important to [place].  If you have drug allergies check your prescription carefully.  You can use MyChart to ask questions about today's visit, request a non-urgent call back, or ask for a work or school excuse for 24 hours related to this e-Visit. If it has been greater than 24 hours you will need to follow up with your provider, or enter a new e-Visit to address those concerns. You will get an e-mail in the next two days asking about your experience.  I hope that your e-visit has been valuable and will speed your recovery. Thank you for using e-visits.

## 2018-06-19 ENCOUNTER — Other Ambulatory Visit: Payer: Self-pay

## 2018-06-19 ENCOUNTER — Encounter: Payer: Self-pay | Admitting: Dietician

## 2018-06-19 ENCOUNTER — Encounter: Payer: PRIVATE HEALTH INSURANCE | Attending: Nurse Practitioner | Admitting: Dietician

## 2018-06-19 VITALS — Ht 68.0 in | Wt 258.5 lb

## 2018-06-19 DIAGNOSIS — E6609 Other obesity due to excess calories: Secondary | ICD-10-CM | POA: Insufficient documentation

## 2018-06-19 DIAGNOSIS — Z6839 Body mass index (BMI) 39.0-39.9, adult: Secondary | ICD-10-CM | POA: Diagnosis not present

## 2018-06-19 NOTE — Progress Notes (Signed)
Medical Nutrition Therapy: Visit start time: 1500  end time: 1530  Assessment:  Diagnosis: obesity Medical history changes: flu and pneumonia 03/2018 Psychosocial issues/ stress concerns: none  Current weight: 258.8lbs Height: 5'9" Medications, supplement changes: reconciled list in medical record  Progress and evaluation:   Patient was ill with flu and pneumonia end of January this year, reports losing about 6lbs when ill.  Weight loss of about 15lbs since previous visit on 02/13/18.  Patient has been tracking intake for several weeks aiming for close to 1600 most days but has fluctuated between 1100-2000.  Patient feels she is doing well with diet at this time; receiving support from sister and mother who are also working on healthy lifestyle changes.  Physical activity: walking 2 miles daily, gradually increasing since recovered from illness  Dietary Intake:  Usual eating pattern includes 3 meals and 2 snacks per day. Dining out frequency: 0-1 meals per week.  Breakfast: premier shake + fruit Snack: apple Lunch: chicken + 2 veg, or sandwich, occ takeout with small portion fries( no more than once a week) Snack: fruit Supper: meat usually chicken, was eating some fish,  + veg  Snack: usually none; occ ice cream, mini candy bar if craving a sweet; fruit; activia yogurt Beverages: limiting diet sodas, none in past 2 months, water 48-64oz daily, lightly sweet tea   Nutrition Care Education: Topics covered: weight control Weight control: reviewed progress since previous visit; reviewed appropriate nutritional balance and importance of low fat and low sugar choices; advantages of allowing occasional treats; benefits of tracking food intake; goals for physical activity   Nutritional Diagnosis:  [place]-3.3 Overweight/obesity As related to history of excess calories and inactivity.  As evidenced by patient with current BMI of 39.3, working on lifestyle changes for ongoing weight  loss.  Intervention:   Instruction and discussion as noted above.  Patient continues to make healthy food choices and is having success with weight loss.   Updated goals with input from patient.   Education Materials given:  Marland Kitchen Multivitamin article, Nutrition Action Healthletter 05/2018 . Goals/ instructions   Learner/ who was taught:  . Patient    Level of understanding: Marland Kitchen Verbalizes/ demonstrates competency  Demonstrated degree of understanding via:   Teach back Learning barriers: . None  Willingness to learn/ readiness for change: . Eager, change in progress   Monitoring and Evaluation:  Dietary intake, exercise, and body weight      follow up: 08/21/18

## 2018-06-19 NOTE — Patient Instructions (Addendum)
   Great job making healthy food choices, keep it up!  Continue to gradually add exercise, either by more intense exercise, longer duration, or adding some weight training exercises. Can also add some stretches or calisthenic-type exercises such as wall push-ups, desk push-ups, during the day.   Keep up with food tracking, use as a tool for awareness, encouragement, etc. -- keep it positive, not stressful.

## 2018-08-21 ENCOUNTER — Encounter: Payer: PRIVATE HEALTH INSURANCE | Attending: Nurse Practitioner | Admitting: Dietician

## 2018-08-21 ENCOUNTER — Encounter: Payer: Self-pay | Admitting: Dietician

## 2018-08-21 ENCOUNTER — Other Ambulatory Visit: Payer: Self-pay

## 2018-08-21 VITALS — Ht 68.0 in | Wt 255.8 lb

## 2018-08-21 DIAGNOSIS — Z6839 Body mass index (BMI) 39.0-39.9, adult: Secondary | ICD-10-CM | POA: Insufficient documentation

## 2018-08-21 DIAGNOSIS — Z6838 Body mass index (BMI) 38.0-38.9, adult: Secondary | ICD-10-CM

## 2018-08-21 DIAGNOSIS — E6609 Other obesity due to excess calories: Secondary | ICD-10-CM | POA: Diagnosis present

## 2018-08-21 NOTE — Patient Instructions (Addendum)
   Limit or avoid regular sodas. Ok to have some diet soda occasionally.   Increase vegetables with lunch meal, try including at least one vegetable or fruit, or both, with each meal.  Great job increasing regular exercise! Keep it up; try some strength exercises as able.

## 2018-08-21 NOTE — Progress Notes (Signed)
Medical Nutrition Therapy: Visit start time: 1500  end time: 1530  Assessment:  Diagnosis: obesity Medical history changes: no changes per patient Psychosocial issues/ stress concerns: some increased stress in recent weeks  Current weight: 255.8lbs Height: 5'8" Medications, supplement changes: reconciled list in medical record  Progress and evaluation:   Patient reports some increased stress which has led to some larger food portons and some higher calorie choices, including some regular sodas and snack foods.   She has continued to work to refrain from giving in to cravings for sweets, despite husband eating sweets after dinner.  She reports some weight fluctuation in recent weeks.   Performed In[employer] body composition measurement, which shows 1% reduction in body fat, stable/ slight increase in muscle mass, with 3.3lb weight loss in past 2 months.    Physical activity: "[place] Median" program -- aiming for 20 miles weekly; walking 30-60 minutes daily or You tube workouts; longer walks on weekends to reach goal.  Dietary Intake:  Usual eating pattern includes 3 meals and 2-3 snacks per day. Dining out frequency: increased in past 2 months.  Breakfast: protein shake + fruit (usu banana) Snack: occasionally 1/2 apple Lunch: often out-- scrambled egg biscuit + 1/2 apple; or low-carb platter 4 eggs + chicken breast; salad with grilled chicken and cheese, small amount dressing; more meals out recently Snack: 1/2 apple Supper: chicken pie with veg; lasagna with salad; pizza 1x a week; meat + 2 veg incl peas, beans Snack: mini rice krispie treats; sometimes 4oz Fairlife chocolate milk after exercise, or 4oz protein shake Beverages: water 5 glasses daily; 2-3 glasses sweet tea (homemade; husband does not like lightly sweetened tea)  Nutrition Care Education: Topics covered: weight control Weight control: reviewed progress since previous visit on 06/19/18; discussed management of stress eating  and cravings, directing thoughts to long-term goals; reasonable, incremental weight loss goals; avoidance of guilt feelings and allowing for occasional treats.   Nutritional Diagnosis:  [place]-3.3 Overweight/obesity As related to history of excess calories and inactivity.  As evidenced by patient with current BMI of 38.89, working on diet and exercise modifications for ongoing weight los.  Intervention:   Instruction and discussion as noted above.  Patient continues to make gradual lifestyle improvements despite increased stress.   Updated goals with input from patient.  She requested next follow-up in about 3 months.  Education Materials given:  Marland Kitchen Goals/ instructions   Learner/ who was taught:  . Patient   Level of understanding: Marland Kitchen Verbalizes/ demonstrates competency   Demonstrated degree of understanding via:   Teach back Learning barriers: . None  Willingness to learn/ readiness for change: . Eager, change in progress  Monitoring and Evaluation:  Dietary intake, exercise, and body weight      follow up: 11/06/18

## 2018-11-06 ENCOUNTER — Ambulatory Visit: Payer: PRIVATE HEALTH INSURANCE | Admitting: Dietician

## 2018-11-11 ENCOUNTER — Other Ambulatory Visit: Payer: Self-pay | Admitting: *Deleted

## 2018-11-11 ENCOUNTER — Telehealth: Payer: Self-pay | Admitting: Obstetrics and Gynecology

## 2018-11-11 MED ORDER — PROGESTERONE MICRONIZED 200 MG PO CAPS: 200.0000 mg | ORAL_CAPSULE | Freq: Every day | ORAL | 6 refills | Status: DC

## 2018-11-11 NOTE — Telephone Encounter (Signed)
The patient called and stated that she needs a refill of the following medications progesterone (PROMETRIUM) 200 MG capsule [26550] and estradiol (ESTRACE) 1 MG tablet [9967]. Please advise.

## 2018-11-11 NOTE — Telephone Encounter (Signed)
Done-ac

## 2018-11-12 ENCOUNTER — Other Ambulatory Visit: Payer: Self-pay | Admitting: *Deleted

## 2018-11-12 MED ORDER — PROGESTERONE MICRONIZED 200 MG PO CAPS: 200.0000 mg | ORAL_CAPSULE | Freq: Every day | ORAL | 6 refills | Status: DC

## 2018-12-04 ENCOUNTER — Other Ambulatory Visit: Payer: Self-pay

## 2018-12-04 ENCOUNTER — Encounter: Payer: Self-pay | Admitting: Family Medicine

## 2018-12-04 ENCOUNTER — Ambulatory Visit (INDEPENDENT_AMBULATORY_CARE_PROVIDER_SITE_OTHER): Payer: PRIVATE HEALTH INSURANCE | Admitting: Family Medicine

## 2018-12-04 VITALS — BP 125/79 | HR 67 | Temp 98.6°F | Resp 16 | Ht 68.0 in | Wt 263.0 lb

## 2018-12-04 DIAGNOSIS — S46911A Strain of unspecified muscle, fascia and tendon at shoulder and upper arm level, right arm, initial encounter: Secondary | ICD-10-CM | POA: Diagnosis not present

## 2018-12-04 DIAGNOSIS — S4991XA Unspecified injury of right shoulder and upper arm, initial encounter: Secondary | ICD-10-CM | POA: Diagnosis not present

## 2018-12-04 NOTE — Patient Instructions (Addendum)
Thank you for coming to the office today.  Recommend to start taking Tylenol Extra Strength 56m tabs - take 1 to 2 tabs per dose (max 1008m every 6-8 hours for pain (take regularly, don't skip a dose for next 7 days), max 24 hour daily dose is 6 tablets or 300043mIn the future you can repeat the same everyday Tylenol course for 1-2 weeks at a time.  - This is safe to take with anti-inflammatory medicines (Ibuprofen, Advil, Naproxen, Aleve, Meloxicam, Mobic)  Ibuprofen could be 600m54m3 times a day.  Recommend heating pad, muscle rub, and stretching exercises, TENS unit  Consider Prednisone if needed, call office if need.  Please schedule a Follow-up Appointment to: Return in about 4 weeks (around 01/01/2019), or if symptoms worsen or fail to improve, for shoulder injury.  If you have any other questions or concerns, please feel free to call the office or send a message through [facility] may also schedule an earlier appointment if necessary.  Additionally, you may be receiving a survey about your experience at our office within a few days to 1 week by e-mail or mail. We value your feedback.  AlexNobie Putnam [id number] Shoulder Exercises  PendPlayitah your good arm against a counter or table for support - BeCl[facility] Va Medical Centerward with a wide stance (make sure your body is comfortable) - Your painful shoulder should hang down and feel "heavy" - Gently move your painful arm in small circles "clockwise" for several turns - Switch to "counterclockwise" for several turns - Early on keep circles narrow and move slowly - Later in rehab, move in larger circles and faster movement   Wall Crawl - Stand close (about 1-2 ft away) to a wall, facing it directly - Reach out with your arm of painful shoulder and place fingers (not palm) on wall - You should make contact with wall at your waist level - Slowly walk your fingers up the  wall. Stay in contact with wall entire time, do not remove fingers - Keep walking fingers up wall until you reach shoulder level - You may feel tightening or mild discomfort, once you reach a height that causes pain or if you are already above your shoulder height then stop. Repeat from starting position. - Early on stand closer to wall, move fingers slowly, and stay at or below shoulder level - Later in rehab, stand farther away from wall (fingertips), move fingers quicker, go above shoulder level

## 2018-12-04 NOTE — Progress Notes (Signed)
Subjective:    Patient ID: Caitlin Barrett, female    DOB: Aug 07, 1970, 48 y.o.   MRN: [id number]  Caitlin Barrett is a 48 y.o. female presenting on 12/04/2018 for Shoulder Injury (Last week )  Patient presents for a same day appointment.  PCP is Wilhelmina Mcardle, AGPCNP-BC  HPI   Right Shoulder Injury Strain - History of chronic R shoulder pain injury possible rotator cuff problem, she went to PT for a while, had some reduced range of motion with it but it did improve. - New injury 6 days ago last week, was on ground bending and reaching forward, and labrador (she was pet sitting for) came down on her R shoulder while it was extended, she had pain initially severe pain radiated to  Neck, across collarbone, felt like "tight spasm" and burning sensation in shoulder, that has gradually improved over past 6 days, the burning sensation has improved, now she has a more constant dull pain occasional episodic pain with certain movements - Taking Ibuprofen 600mg  and Tylenol 1000mg  per dose PRN, alternating dosage - Admits pain at night can wake her up - Admits occasional shooting numbness and tingling into R arm, some reduced sensation when picked something up but now mostly better with regards to that - Denies any worsening pain, bruising redness fever chills   Depression screen [facility] 2/9 12/04/2018 06/19/2018 04/22/2018  Decreased Interest 0 - 0  Down, Depressed, Hopeless 0 1 0  PHQ - 2 Score 0 1 0  Altered sleeping - - 1  Tired, decreased energy - - 1  Change in appetite - - 0  Feeling bad or failure about yourself  - - 0  Trouble concentrating - - 0  Moving slowly or fidgety/restless - - 0  Suicidal thoughts - - 0  PHQ-9 Score - - 2  Difficult doing work/chores - - Somewhat difficult    Social History   Tobacco Use  . Smoking status: Never Smoker  . Smokeless tobacco: Never Used  Substance Use Topics  . Alcohol use: Not Currently    Comment: rare  . Drug use: No    Review of  Systems Per HPI unless specifically indicated above     Objective:    BP 125/79   Pulse 67   Temp 98.6 F (37 C) (Oral)   Resp 16   Ht 5\' 8"  (1.727 m)   Wt 263 lb (119.3 kg)   LMP 05/26/2016 (Exact Date)   BMI 39.99 kg/m   Wt Readings from Last 3 Encounters:  12/04/18 263 lb (119.3 kg)  08/21/18 255 lb 12.8 oz (116 kg)  06/19/18 258 lb 8 oz (117.3 kg)    Physical Exam Vitals signs and nursing note reviewed.  Constitutional:      General: She is not in acute distress.    Appearance: She is well-developed. She is not diaphoretic.     Comments: Well-appearing, comfortable, cooperative  HENT:     Head: Normocephalic and atraumatic.  Eyes:     General:        Right eye: No discharge.        Left eye: No discharge.     Conjunctiva/sclera: Conjunctivae normal.  Cardiovascular:     Rate and Rhythm: Normal rate.  Pulmonary:     Effort: Pulmonary effort is normal.  Musculoskeletal:     Comments: RIGHT Shoulder Inspection: Normal appearance bilateral symmetrical Palpation: Mild tender to palpation posterior shoulder and anterior ROM: REDUCED active ROM flexion to near  shoulder level, abduction as well, intact internal rotation - note some reduced ROM prior to current injury  Special Testing: Rotator cuff testing negative for weakness with supraspinatus full can and empty can test - mild pain on full can, Hawkin's AC impingement mildly positive for pain Strength: Normal strength 5/5 flex/ext, ext rot / int rot, grip, rotator cuff str testing. Neurovascular: Distally intact pulses, sensation to light touch   Skin:    General: Skin is warm and dry.     Findings: No erythema or rash.  Neurological:     Mental Status: She is alert and oriented to person, place, and time.  Psychiatric:        Behavior: Behavior normal.     Comments: Well groomed, good eye contact, normal speech and thoughts        Results for orders placed or performed in visit on 04/09/18  Progesterone   Result Value Ref Range   Progesterone <0.1 ng/mL  Testosterone, Free, Total, SHBG  Result Value Ref Range   Testosterone 8 8 - 48 ng/dL   Testosterone, Free 1.2 0.0 - 4.2 pg/mL   Sex Hormone Binding 35.4 24.6 - 122.0 nmol/L  Estradiol  Result Value Ref Range   Estradiol 29.2 pg/mL  Cortisol  Result Value Ref Range   Cortisol 13.6 ug/dL      Assessment & Plan:   Problem List Items Addressed This Visit    None    Visit Diagnoses    Injury of right shoulder, initial encounter    -  Primary   Strain of right shoulder, initial encounter          Consistent with acute Right-shoulder muscle strain with impact injury and likely pinched or pulled nerve as well Possible bursitis flare, with known rotator cuff tendinopathy old injury but currently without new weakness No imaging  Plan: 1. Reassurance - improving at this time - will continue increased regimen of OTC meds and home rehab exercises - Increase Tylenol to 1000mg  TID PRN, alternate with Ibuprofen 600mg  TID PRN - Relative rest but keep shoulder mobile, demonstrated ROM exercises, avoid heavy lifting - May try heating pad PRN, TENs unit - Home exercise stretching ROM given, use other prior injury exercises as well Follow-up 4-6 weeks if not improved for re-evaluation, consider referral to Physical Therapy, X-rays, possible prednisone if still nerve symptoms   No orders of the defined types were placed in this encounter.    Follow up plan: Return in about 4 weeks (around 01/01/2019), or if symptoms worsen or fail to improve, for shoulder injury.   Saralyn Pilar, DO [facility] 12/04/2018, 8:42 AM

## 2018-12-08 ENCOUNTER — Encounter: Payer: PRIVATE HEALTH INSURANCE | Attending: Nurse Practitioner | Admitting: Dietician

## 2018-12-08 ENCOUNTER — Encounter: Payer: Self-pay | Admitting: Dietician

## 2018-12-08 ENCOUNTER — Other Ambulatory Visit: Payer: Self-pay

## 2018-12-08 ENCOUNTER — Ambulatory Visit: Payer: PRIVATE HEALTH INSURANCE | Admitting: Dietician

## 2018-12-08 VITALS — Ht 68.0 in | Wt 259.8 lb

## 2018-12-08 DIAGNOSIS — E6609 Other obesity due to excess calories: Secondary | ICD-10-CM

## 2018-12-08 DIAGNOSIS — E669 Obesity, unspecified: Secondary | ICD-10-CM

## 2018-12-08 DIAGNOSIS — Z6841 Body Mass Index (BMI) 40.0 and over, adult: Secondary | ICD-10-CM | POA: Insufficient documentation

## 2018-12-08 NOTE — Progress Notes (Signed)
Medical Nutrition Therapy: Visit start time: 0930  end time: 1000  Assessment:  Diagnosis: obesity Medical history changes: shoulder injury last week; PCP has stopped Levothyroxine for now Psychosocial issues/ stress concerns: some increased stress with increase in work hours, busy schedule  Current weight: 259.8lbs Height: 5'8" Medications, supplement changes: reconciled list in medical record  Progress and evaluation:   Patient reports increased work hours in the past several weeks, which has made regular exercise more of a challenge. She had been doing some strength training until shoulder injury last week.    Weight has increased by 6lbs since previous visit on 08/21/18. Patient feels due to less exercise, eating more sweets and less vegetables recently.  Completed InBody body composition measurement, which shows increase in muscle mass by 3lbs and decrease in body fat percent by 0.6% since 08/21/18.  Physical activity: walking 2x a week; will restart [facility] walks program  Dietary Intake:  Usual eating pattern includes 3 meals and 1-2 snacks per day. Dining out frequency: decreased in past 3 months.  Breakfast: Premier protein shake + fruit; eggs + banana/ fruit Snack: none or fruit Lunch: fewer restaurant meals; sandwich at home with beef or peanut butter + remainder of fruit from breakfast; leftovers meat + small can of peas and carrots or lima beans Snack: fruit, sometimes remainder of protein shake if not finished in am Supper: meat + veg ie chicken pie, lasagna, pizza 1x a week Snack: more sweets recently; activia yogurt; berries  Beverages: 3-4 glasses; sweet tea when out, decreasing intake at home; occ regular Gatorade when very hot, sometimes diluted with water (sugar substitute upsets stomach)  Nutrition Care Education: Topics covered: weight control    Weight control:  determining reasonable weight loss rate; basic energy needs estimated at 1200-1400kcal daily; body  composition measurement and factors affecting muscle weight and fat weight; healthy snack ideas Advanced nutrition: cooking techniques and options for choosing and preparing vegetables  Nutritional Diagnosis:  [place]-3.3 Overweight/obesity As related to excess calories and limited activity.  As evidenced by patient with current BMI of 39.5, working on dietary and lifestyle changes for ongoing weight loss.  Intervention:   Discussion and instruction as noted above.  Despite some setback due to schedule changes and shoulder injury, patient is beginning to resume efforts for weight loss.   Updated nutrition goals with direction from patient.   Patient requested next follow-up prior to Thanksgiving holiday.  Education Materials given:  Marland Kitchen Increasing Vegetables and Fruits handout with recipes . Goals/ instructions   Learner/ who was taught:  . Patient    Level of understanding: Marland Kitchen Verbalizes/ demonstrates competency   Demonstrated degree of understanding via:   Teach back Learning barriers: . None  Willingness to learn/ readiness for change: . Eager, change in progress   Monitoring and Evaluation:  Dietary intake, exercise, and body weight      follow up: 02/09/19

## 2018-12-08 NOTE — Patient Instructions (Signed)
   Continue to find healthy snack options; fruit, yogurt, whole grain cereals, nuts are good options.  Increase vegetables, including low-carb choices like zucchini, asparagus, cauliflower, carrots, cucumbers, and some not-quite as low-carb sweet potatoes, beets, butternut or acorn squash, turnips, etc. Root veg like these are good for roasting.   Check Mrs [email or web address] or other websites for more ideas for preparing veggies.   Continue to incorporate exercise on a regular basis and resume some strength training only after cleared by MD.

## 2018-12-09 ENCOUNTER — Other Ambulatory Visit: Payer: Self-pay | Admitting: Nurse Practitioner

## 2018-12-09 DIAGNOSIS — R079 Chest pain, unspecified: Secondary | ICD-10-CM

## 2018-12-26 ENCOUNTER — Encounter: Payer: Self-pay | Admitting: Dietician

## 2018-12-26 NOTE — Progress Notes (Signed)
Sent request for new MNT referral for patient to referring provider, as her previous referral has expired and patient would like to continue visits due to ongoing effort to lose weight.

## 2019-02-04 ENCOUNTER — Ambulatory Visit (INDEPENDENT_AMBULATORY_CARE_PROVIDER_SITE_OTHER): Payer: PRIVATE HEALTH INSURANCE | Admitting: Certified Nurse Midwife

## 2019-02-04 ENCOUNTER — Other Ambulatory Visit: Payer: Self-pay

## 2019-02-04 ENCOUNTER — Encounter: Payer: Self-pay | Admitting: Certified Nurse Midwife

## 2019-02-04 VITALS — BP 130/90 | HR 70 | Ht 68.0 in | Wt 267.6 lb

## 2019-02-04 DIAGNOSIS — R102 Pelvic and perineal pain unspecified side: Secondary | ICD-10-CM

## 2019-02-04 DIAGNOSIS — R350 Frequency of micturition: Secondary | ICD-10-CM

## 2019-02-04 LAB — POCT URINALYSIS DIPSTICK
Bilirubin, UA: NEGATIVE
Glucose, UA: NEGATIVE
Ketones, UA: NEGATIVE
Leukocytes, UA: NEGATIVE
Nitrite, UA: NEGATIVE
Protein, UA: NEGATIVE
Spec Grav, UA: 1.01 (ref 1.010–1.025)
Urobilinogen, UA: 0.2 E.U./dL
pH, UA: 6.5 (ref 5.0–8.0)

## 2019-02-04 NOTE — Progress Notes (Signed)
AT was called to hospital- unable to finish visit with patient. Patient was scheduled for an ultrasound on 11/ 12/ 20. Once ultrasound is complete and reviewed AT will let reach out to patient with results.

## 2019-02-05 ENCOUNTER — Encounter: Payer: PRIVATE HEALTH INSURANCE | Admitting: Certified Nurse Midwife

## 2019-02-05 ENCOUNTER — Ambulatory Visit (INDEPENDENT_AMBULATORY_CARE_PROVIDER_SITE_OTHER): Payer: PRIVATE HEALTH INSURANCE

## 2019-02-05 ENCOUNTER — Other Ambulatory Visit: Payer: Self-pay | Admitting: Family Medicine

## 2019-02-05 DIAGNOSIS — E669 Obesity, unspecified: Secondary | ICD-10-CM

## 2019-02-05 DIAGNOSIS — R102 Pelvic and perineal pain: Secondary | ICD-10-CM | POA: Diagnosis not present

## 2019-02-06 ENCOUNTER — Other Ambulatory Visit (HOSPITAL_COMMUNITY)
Admission: RE | Admit: 2019-02-06 | Discharge: 2019-02-06 | Disposition: A | Discharge status: Home / Self Care | Payer: PRIVATE HEALTH INSURANCE | Source: Ambulatory Visit | Attending: Certified Nurse Midwife | Admitting: Certified Nurse Midwife

## 2019-02-06 ENCOUNTER — Ambulatory Visit: Payer: PRIVATE HEALTH INSURANCE | Admitting: Certified Nurse Midwife

## 2019-02-06 ENCOUNTER — Other Ambulatory Visit: Payer: Self-pay

## 2019-02-06 VITALS — BP 127/77 | HR 64 | Ht 68.0 in | Wt 265.3 lb

## 2019-02-06 DIAGNOSIS — N83202 Unspecified ovarian cyst, left side: Secondary | ICD-10-CM

## 2019-02-06 DIAGNOSIS — R319 Hematuria, unspecified: Secondary | ICD-10-CM

## 2019-02-06 LAB — URINE CULTURE

## 2019-02-06 NOTE — Patient Instructions (Signed)
Ovarian Cyst     An ovarian cyst is a fluid-filled sac that forms on an ovary. The ovaries are small organs that produce eggs in women. Various types of cysts can form on the ovaries. Some may cause symptoms and require treatment. Most ovarian cysts go away on their own, are not cancerous (are benign), and do not cause problems. Common types of ovarian cysts include:  Functional (follicle) cysts. ? Occur during the menstrual cycle, and usually go away with the next menstrual cycle if you do not get pregnant. ? Usually cause no symptoms.  Endometriomas. ? Are cysts that form from the tissue that lines the uterus (endometrium). ? Are sometimes called "chocolate cysts" because they become filled with blood that turns brown. ? Can cause pain in the lower abdomen during intercourse and during your period.  Cystadenoma cysts. ? Develop from cells on the outside surface of the ovary. ? Can get very large and cause lower abdomen pain and pain with intercourse. ? Can cause severe pain if they twist or break open (rupture).  Dermoid cysts. ? Are sometimes found in both ovaries. ? May contain different kinds of body tissue, such as skin, teeth, hair, or cartilage. ? Usually do not cause symptoms unless they get very big.  Theca lutein cysts. ? Occur when too much of a certain hormone (human chorionic gonadotropin) is produced and overstimulates the ovaries to produce an egg. ? Are most common after having procedures used to assist with the conception of a baby (in vitro fertilization). What are the causes? Ovarian cysts may be caused by:  Ovarian hyperstimulation syndrome. This is a condition that can develop from taking fertility medicines. It causes multiple large ovarian cysts to form.  Polycystic ovarian syndrome (PCOS). This is a common hormonal disorder that can cause ovarian cysts, as well as problems with your period or fertility. What increases the risk? The following factors may  make you more likely to develop ovarian cysts:  Being overweight or obese.  Taking fertility medicines.  Taking certain forms of hormonal birth control.  Smoking. What are the signs or symptoms? Many ovarian cysts do not cause symptoms. If symptoms are present, they may include:  Pelvic pain or pressure.  Pain in the lower abdomen.  Pain during sex.  Abdominal swelling.  Abnormal menstrual periods.  Increasing pain with menstrual periods. How is this diagnosed? These cysts are commonly found during a routine pelvic exam. You may have tests to find out more about the cyst, such as:  Ultrasound.  X-ray of the pelvis.  CT scan.  MRI.  Blood tests. How is this treated? Many ovarian cysts go away on their own without treatment. Your health care provider may want to check your cyst regularly for 2-3 months to see if it changes. If you are in menopause, it is especially important to have your cyst monitored closely because menopausal women have a higher rate of ovarian cancer. When treatment is needed, it may include:  Medicines to help relieve pain.  A procedure to drain the cyst (aspiration).  Surgery to remove the whole cyst.  Hormone treatment or birth control pills. These methods are sometimes used to help dissolve a cyst. Follow these instructions at home:  Take over-the-counter and prescription medicines only as told by your health care provider.  Do not drive or use heavy machinery while taking prescription pain medicine.  Get regular pelvic exams and Pap tests as often as told by your health care provider.  Return to your normal activities as told by your health care provider. Ask your health care provider what activities are safe for you.  Do not use any products that contain nicotine or tobacco, such as cigarettes and e-cigarettes. If you need help quitting, ask your health care provider.  Keep all follow-up visits as told by your health care provider.  This is important. Contact a health care provider if:  Your periods are late, irregular, or painful, or they stop.  You have pelvic pain that does not go away.  You have pressure on your bladder or trouble emptying your bladder completely.  You have pain during sex.  You have any of the following in your abdomen: ? A feeling of fullness. ? Pressure. ? Discomfort. ? Pain that does not go away. ? Swelling.  You feel generally ill.  You become constipated.  You lose your appetite.  You develop severe acne.  You start to have more body hair and facial hair.  You are gaining weight or losing weight without changing your exercise and eating habits.  You think you may be pregnant. Get help right away if:  You have abdominal pain that is severe or gets worse.  You cannot eat or drink without vomiting.  You suddenly develop a fever.  Your menstrual period is much heavier than usual. This information is not intended to replace advice given to you by your health care provider. Make sure you discuss any questions you have with your health care provider. Document Released: 03/12/2005 Document Revised: 06/10/2017 Document Reviewed: 08/14/2015 Elsevier Patient Education  2020 [employer].

## 2019-02-06 NOTE — Progress Notes (Signed)
GYN ENCOUNTER NOTE  Subjective:       Caitlin Barrett is a 48 y.o. G0P0000 female is here for gynecologic evaluation of the following issues:  1Pt present for follow and review of u/s results    Gynecologic History Patient's last menstrual period was 05/26/2016 (exact date). Contraception: none, hysterectomy  Last Pap: 2015. Results were: normal Last mammogram: 02/05/18 Results were: normal  Obstetric History OB History  Gravida Para Term Preterm AB Living  0 0 0 0 0 0  SAB TAB Ectopic Multiple Live Births  0 0 0 0 0    Past Medical History:  Diagnosis Date  . Allergy    seasonal.  Takes allergy shots at [facility]  . Anemia   . Anxiety   . Arthritis   . Asthma    exercise induced--no inhalers  . Cancer (HCC) 2010   Basal cell skin cancer Lt eye, skin graft above Lt eye  . Depression   . Dysmenorrhea   . Headache   . Hypothyroid    Nodules and Fine needle aspiration  . IBS (irritable bowel syndrome)   . Painful menstrual periods   . Plantar fasciitis   . Sprain of carpal (joint) of wrist     Past Surgical History:  Procedure Laterality Date  . ABDOMINAL HYSTERECTOMY Bilateral 07/02/2016   Procedure: HYSTERECTOMY ABDOMINAL WITH BILATERAL SALPINGECTOMY-MIDLINE INCISION;  Surgeon: Herold Harms, MD;  Location: [facility] ORS;  Service: Gynecology;  Laterality: Bilateral;  . CHOLECYSTECTOMY  2009  . HERNIA REPAIR  1974   umbilical  . SKIN GRAFT  2010  . TONSILLECTOMY      Current Outpatient Medications on File Prior to Visit  Medication Sig Dispense Refill  . albuterol (VENTOLIN HFA) 108 (90 Base) MCG/ACT inhaler TAKE 2 PUFFS BY MOUTH EVERY 6 HOURS AS NEEDED FOR WHEEZE OR SHORTNESS OF BREATH 18 g 2  . AZO-CRANBERRY PO Take by mouth.    . baclofen (LIORESAL) 10 MG tablet TAKE 1/2 TO 1 TABLET BY MOUTH AT BEDTIME AS NEEDED FOR MUSCLE SPASM 30 tablet 2  . Cholecalciferol (VITAMIN D3 GUMMIES ADULT PO) Take by mouth.    . COLLAGEN-BORON-HYALURONIC ACID PO Take by  mouth.    . diphenhydrAMINE (BENADRYL ALLERGY) 25 mg capsule Take 25 mg by mouth every 6 (six) hours as needed.    Marland Kitchen EPINEPHrine 0.3 mg/0.3 mL IJ SOAJ injection 0.3 mg as needed.     Marland Kitchen estradiol (ESTRACE) 1 MG tablet Take 1 tablet (1 mg total) by mouth daily. 30 tablet 10  . ibuprofen (ADVIL,MOTRIN) 400 MG tablet Take 400 mg by mouth every 6 (six) hours as needed.    . Loratadine 10 MG CAPS Take by mouth.    . Melatonin 3 MG TABS Take by mouth.    . Misc Natural Products (OSTEO BI-FLEX ADV TRIPLE ST PO) Take by mouth.    . Multiple Vitamins-Minerals (MULTIVITAMIN WOMEN PO) Take by mouth.    . ondansetron (ZOFRAN) 4 MG tablet Take by mouth.    Marland Kitchen oxymetazoline (AFRIN) 0.05 % nasal spray Place 1 spray into both nostrils 2 (two) times daily.    . Probiotic Product (TRUBIOTICS) CAPS Take 1 capsule by mouth daily.    . progesterone (PROMETRIUM) 200 MG capsule Take 1 capsule (200 mg total) by mouth daily. 30 capsule 6  . triamcinolone cream (KENALOG) 0.1 % APPLY THIN FILM TWICE DAILY TO ECZEMA UNTIL CLEAR     No current facility-administered medications on file prior to visit.  Allergies  Allergen Reactions  . Contrast Media [Iodinated Diagnostic Agents]   . Codeine Nausea Only  . Other Hives    Uncoded Allergy. Allergen: contrast dye Uncoded Allergy. Allergen: contrast dye    Social History   Socioeconomic History  . Marital status: Married    Spouse name: Not on file  . Number of children: Not on file  . Years of education: Not on file  . Highest education level: Not on file  Occupational History  . Not on file  Social Needs  . Financial resource strain: Not on file  . Food insecurity    Worry: Not on file    Inability: Not on file  . Transportation needs    Medical: Not on file    Non-medical: Not on file  Tobacco Use  . Smoking status: Never Smoker  . Smokeless tobacco: Never Used  Substance and Sexual Activity  . Alcohol use: Not Currently    Comment: rare  . Drug  use: No  . Sexual activity: Yes    Birth control/protection: Surgical  Lifestyle  . Physical activity    Days per week: Not on file    Minutes per session: Not on file  . Stress: Not on file  Relationships  . Social Musician on phone: Not on file    Gets together: Not on file    Attends religious service: Not on file    Active member of club or organization: Not on file    Attends meetings of clubs or organizations: Not on file    Relationship status: Not on file  . Intimate partner violence    Fear of current or ex partner: Not on file    Emotionally abused: Not on file    Physically abused: Not on file    Forced sexual activity: Not on file  Other Topics Concern  . Not on file  Social History Narrative  . Not on file    Family History  Problem Relation Age of Onset  . Hyperlipidemia Mother   . Hypertension Mother   . Cancer Father        lung CA  . Heart disease Maternal Grandmother   . Stroke Maternal Grandmother   . Hypertension Maternal Grandmother   . Diabetes Maternal Grandmother   . Heart disease Maternal Grandfather   . Diabetes Maternal Grandfather   . Heart attack Maternal Grandfather     The following portions of the patient's history were reviewed and updated as appropriate: allergies, current medications, past family history, past medical history, past social history, past surgical history and problem list.  Review of Systems Review of Systems - Negative except as mentioned in HPI Review of Systems - General ROS: negative for - chills, fatigue, fever, hot flashes, malaise or night sweats Hematological and Lymphatic ROS: negative for - bleeding problems or swollen lymph nodes Gastrointestinal ROS: negative for - abdominal pain, blood in stools, change in bowel habits and nausea/vomiting Musculoskeletal ROS: negative for - joint pain, muscle pain or muscular weakness Genito-Urinary ROS: negative for - change in menstrual cycle, dysmenorrhea,  dyspareunia, dysuria, genital discharge, genital ulcers, hematuria, incontinence, irregular/heavy menses, nocturia or pelvic painjj  Objective:   LMP 05/26/2016 (Exact Date)  CONSTITUTIONAL: Well-developed, well-nourished female in no acute distress.  HENT:  Normocephalic, atraumatic.  NECK: Normal range of motion, supple, no masses.  Normal thyroid.  SKIN: Skin is warm and dry. No rash noted. Not diaphoretic. No erythema. No pallor. NEUROLGIC: Alert  and oriented to person, place, and time. PSYCHIATRIC: Normal mood and affect. Normal behavior. Normal judgment and thought content. CARDIOVASCULAR:Not Examined RESPIRATORY: Not Examined BREASTS: Not Examined ABDOMEN: Soft, non distended; Non tender.  No Organomegaly. PELVIC:  External Genitalia: Normal  BUS: Normal  Vagina: Normal  Cervix: Normal  Uterus: absent  Adnexa left sided pain , difficult to assess for mass due to body habitus  RV: Normal   Bladder: Nontender MUSCULOSKELETAL: Normal range of motion. No tenderness.  No cyanosis, clubbing, or edema.  Patient Name: FRANK ZUPANCIC DOB: 1971/02/04 MRN: [id number] ULTRASOUND REPORT  Location: [facility] Date of Service: 02/05/2019   TECHNIQUE: Both transabdominal and transvaginal ultrasound examinations of the pelvis were performed. Transabdominal technique was performed for global imaging of the pelvis including uterus, ovaries, adnexal regions, and pelvic cul-de-sac. It was necessary to proceed with endovaginal exam following the transabdominal exam to visualize the endometrium and adnexa.  Indications:Pelvic Pain Findings:  Uterus was removed.   Right Ovary measures 2.3 X 1.7 x 1.5 cm. It is normal in appearance. Left Ovary measures 4.4 x 3.5 x 3.6 cm. It is not normal in appearance. Hypoechoic lesion with web like septations. No color Doppler with-in lesion.                                                                                                                                Measuring at 3.4 x 3.1 x 3.1 cm.  Survey of the adnexa demonstrates no adnexal masses. There is no free fluid in the cul de sac.  Impression: 1. Lt ovarian cystic lesion possible hemorraghic cyst as described above..   Recommendations: 1.Clinical correlation with the patient's History and Physical Exam.   Jenine M. Marciano Sequin    RDMS   I have reviewed this study and agree with documented findings.    Hildred Laser, MD [facility]   Assessment:   Pelvic Pain   Plan:   U/s reviewed with pt. Discussed lesion found on left side . Discussed completing testing for ROMA scoring. Pt agrees to plan . Vaginal swab today to r/o infection, pt had blood in urine , urine culture was negative. Will follow up with results and ROMA score. U/s in 3 months for follow up of left ovarian cystic lesion.   Doreene Burke, CNM

## 2019-02-07 LAB — OVARIAN MALIGNANCY RISK-ROMA
Cancer Antigen (CA) 125: 10.1 U/mL (ref 0.0–38.1)
HE4: 31.9 pmol/L (ref 0.0–63.6)
Postmenopausal ROMA: 0.58
Premenopausal ROMA: 0.26

## 2019-02-07 LAB — POSTMENOPAUSAL INTERP: LOW

## 2019-02-07 LAB — PREMENOPAUSAL INTERP: LOW

## 2019-02-09 ENCOUNTER — Ambulatory Visit: Payer: PRIVATE HEALTH INSURANCE | Admitting: Dietician

## 2019-02-10 ENCOUNTER — Encounter: Payer: Self-pay | Admitting: Family Medicine

## 2019-02-10 ENCOUNTER — Other Ambulatory Visit: Payer: Self-pay | Admitting: Certified Nurse Midwife

## 2019-02-10 ENCOUNTER — Ambulatory Visit (INDEPENDENT_AMBULATORY_CARE_PROVIDER_SITE_OTHER): Payer: PRIVATE HEALTH INSURANCE | Admitting: Family Medicine

## 2019-02-10 ENCOUNTER — Other Ambulatory Visit: Payer: Self-pay

## 2019-02-10 VITALS — BP 152/80 | HR 81 | Temp 98.1°F | Resp 20 | Ht 68.0 in | Wt 270.6 lb

## 2019-02-10 DIAGNOSIS — K625 Hemorrhage of anus and rectum: Secondary | ICD-10-CM

## 2019-02-10 DIAGNOSIS — E038 Other specified hypothyroidism: Secondary | ICD-10-CM

## 2019-02-10 DIAGNOSIS — K5900 Constipation, unspecified: Secondary | ICD-10-CM

## 2019-02-10 DIAGNOSIS — R5383 Other fatigue: Secondary | ICD-10-CM | POA: Diagnosis not present

## 2019-02-10 DIAGNOSIS — N83202 Unspecified ovarian cyst, left side: Secondary | ICD-10-CM | POA: Diagnosis not present

## 2019-02-10 LAB — CERVICOVAGINAL ANCILLARY ONLY
Bacterial Vaginitis (gardnerella): POSITIVE — AB
Candida Glabrata: NEGATIVE
Candida Vaginitis: POSITIVE — AB
Comment: NEGATIVE
Comment: NEGATIVE
Comment: NEGATIVE

## 2019-02-10 MED ORDER — METRONIDAZOLE 500 MG PO TABS: 500.0000 mg | ORAL_TABLET | Freq: Two times a day (BID) | ORAL | 0 refills | Status: AC

## 2019-02-10 MED ORDER — KETOROLAC TROMETHAMINE 10 MG PO TABS: 10.0000 mg | ORAL_TABLET | Freq: Four times a day (QID) | ORAL | 0 refills | Status: DC | PRN

## 2019-02-10 MED ORDER — FLUCONAZOLE 150 MG PO TABS: 150.0000 mg | ORAL_TABLET | Freq: Once | ORAL | 1 refills | Status: AC

## 2019-02-10 NOTE — Patient Instructions (Addendum)
Thank you for coming to the office today.  GYN for ovarian cyst  Ribs likely costochondritis pain, can be referred pain.  Labs - tomorrow  Perhaps if poor sleep then you could be more fatigued, can be a vicious cycle to cause worse fatigue  If any significant worsening or new concerns, reach out to [place], or seek care at hospital or urgent care sooner.  Next option if all other avenues are negative - could consider a Chest X-ray / Steroid course for possible asthma related.  DUE for FASTING BLOOD WORK (no food or drink after midnight before the lab appointment, only water or coffee without cream/sugar on the morning of)  SCHEDULE "Lab Only" visit in the morning at the clinic for lab draw in 1 day  - Make sure Lab Only appointment is at about 1 week before your next appointment, so that results will be available  For Lab Results, once available within 2-3 days of blood draw, you can can log in to MyChart online to view your results and a brief explanation. Also, we can discuss results at next follow-up visit.   Please schedule a Follow-up Appointment to: Return in about 1 week (around 02/17/2019), or if symptoms worsen or fail to improve, for fatigue / dyspnea.  If you have any other questions or concerns, please feel free to call the office or send a message through [place]. You may also schedule an earlier appointment if necessary.  Additionally, you may be receiving a survey about your experience at our office within a few days to 1 week by e-mail or mail. We value your feedback.  Nobie Putnam, DO [place]

## 2019-02-10 NOTE — Progress Notes (Signed)
Subjective:    Patient ID: Caitlin Barrett, female    DOB: [birth year], 48 y.o.   MRN: [id number]  Caitlin Barrett is a 48 y.o. female presenting on 02/10/2019 for Abdominal Pain (lower and upper pain ) and Fatigue   HPI  Abdominal Pelvic Pain and Pressure Followed by [facility], see prior notes recently with 02/06/19 visit Ovarian cyst, Left sided. Identified on pelvic US. Monitoring it, taking Ibuprofen, Tylenol - Anticipate repeat in 3 months, [place] - Prior history of hysterectomy previously, no prior history of ovarian cyst.  Rib Pain Bilateral, Left > Right, having episodic intermittent rib pain. Dull ache, about 30 minutes, taking Tylenol / Ibuprofen - She had testing for COVID19 multiple times in recent history all negative - She had history of costochondritis with chest wall pain in past - Now pain in ribs is reproducible if pushes on it can be worse, can be spontaneous not related to breathing or exertion.  Fatigued / Tired / Reduced Energy Recent problem, gradual decline over past 2-3 weeks, feels more fatigued now. Denies daytime sleepiness and dozing off, feels more tired fatigued and winded at times. Able to do less walking. She says usually goes and walks a few miles, this past weekend tried to walk was not able to as far. - No recent labs on file. - History of Hypothyroidism (central) following with Endocrinology, she is due for further evaluation, she was taken off levothyroxine, she is unsure if this is related. - Admits poor sleep worsening her energy levels as well - Also has history of prior pneumonia in past, but she says does not seem to be as severe not interested in x-ray at this time. - Does admit some blood in stools and constipation, see mychart message - Denies significant cough, wheezing, fever chills body aches   Depression screen [facility] 2/9 02/10/2019 12/04/2018 06/19/2018  Decreased Interest 1 0 -  Down, Depressed, Hopeless 1 0 1  PHQ - 2 Score 2 0 1  Altered  sleeping 2 - -  Tired, decreased energy 3 - -  Change in appetite 2 - -  Feeling bad or failure about yourself  1 - -  Trouble concentrating 0 - -  Moving slowly or fidgety/restless 2 - -  Suicidal thoughts 0 - -  PHQ-9 Score 12 - -  Difficult doing work/chores Somewhat difficult - -   GAD 7 : Generalized Anxiety Score 02/10/2019 04/22/2018  Nervous, Anxious, on Edge 2 1  Control/stop worrying 2 1  Worry too much - different things 2 1  Trouble relaxing 2 1  Restless 1 0  Easily annoyed or irritable 0 1  Afraid - awful might happen 2 0  Total GAD 7 Score 11 5  Anxiety Difficulty Somewhat difficult Not difficult at all     Social History   Tobacco Use  . Smoking status: Never Smoker  . Smokeless tobacco: Never Used  Substance Use Topics  . Alcohol use: Not Currently    Comment: rare  . Drug use: No    Review of Systems Per HPI unless specifically indicated above     Objective:    BP (!) 152/80 (BP Location: Left Arm, Patient Position: Sitting, Cuff Size: Normal)   Pulse 81   Temp 98.1 F (36.7 C) (Oral)   Resp 20   Ht 5\' 8"  (1.727 m)   Wt 270 lb 9.6 oz (122.7 kg)   LMP 05/26/2016 (Exact Date)   SpO2 100%   BMI  41.14 kg/m   Wt Readings from Last 3 Encounters:  02/10/19 270 lb 9.6 oz (122.7 kg)  02/06/19 265 lb 5 oz (120.3 kg)  02/04/19 267 lb 9 oz (121.4 kg)    Physical Exam Vitals signs and nursing note reviewed.  Constitutional:      General: She is not in acute distress.    Appearance: She is well-developed. She is not diaphoretic.     Comments: Well-appearing, comfortable, cooperative  HENT:     Head: Normocephalic and atraumatic.  Eyes:     General:        Right eye: No discharge.        Left eye: No discharge.     Conjunctiva/sclera: Conjunctivae normal.  Neck:     Musculoskeletal: Normal range of motion and neck supple.     Thyroid: No thyromegaly.  Cardiovascular:     Rate and Rhythm: Normal rate and regular rhythm.     Heart sounds:  Normal heart sounds. No murmur.  Pulmonary:     Effort: Pulmonary effort is normal. No respiratory distress.     Breath sounds: Normal breath sounds. No wheezing or rales.     Comments: Some slight reduced air movement bilateral lung bases, but no wheezing or crackles or asymmetry Abdominal:     General: Bowel sounds are normal. There is no distension.     Palpations: Abdomen is soft. There is no mass.     Tenderness: There is no abdominal tenderness.  Musculoskeletal: Normal range of motion.     Comments: Reproducible rib pain localized bilateral flank lower ribs, some palpable muscle knot left side at this area.  Lymphadenopathy:     Cervical: No cervical adenopathy.  Skin:    General: Skin is warm and dry.     Findings: No erythema or rash.  Neurological:     Mental Status: She is alert and oriented to person, place, and time.  Psychiatric:        Behavior: Behavior normal.     Comments: Well groomed, good eye contact, normal speech and thoughts       Results for orders placed or performed in visit on 02/06/19  Ovarian Malignancy Risk-ROMA  Result Value Ref Range   Cancer Antigen (CA) 125 10.1 0.0 - 38.1 U/mL   HE4 31.9 0.0 - 63.6 pmol/L   Premenopausal ROMA 0.26 See below   Postmenopausal ROMA 0.58 See below   Comment Comment   Premenopausal Interp: LOW  Result Value Ref Range   Premenopausal Interp: LOW Comment   Postmenopausal Interp: LOW  Result Value Ref Range   Postmenopausal Interp: LOW Comment   Cervicovaginal ancillary only  Result Value Ref Range   Bacterial Vaginitis (gardnerella) Positive (A)    Candida Vaginitis Positive (A)    Candida Glabrata Negative    Comment      Normal Reference Range Bacterial Vaginosis - Negative   Comment Normal Reference Range Candida Species - Negative    Comment Normal Reference Range Candida Galbrata - Negative       Assessment & Plan:   Problem List Items Addressed This Visit    Ovarian cyst, left   Central  hypothyroidism    Other Visit Diagnoses    Fatigue, unspecified type    -  Primary   Relevant Orders   [facility] - CBC with Differential/Platelet physical   [facility] - CMET w/ GFR CMP Complete Metabolic Panel physical      #Ovarian Cyst Follow w/ [facility] with US  imaging, lab monitoring Seems less likely to be causing any referred pain to ribs  #Rib pain Likely inflammatory similar to MSK costochondritis issue Already has muscle relaxant, tylenol/nsaid, reviewed precautions  #Fatigue/Tired / Reduced Energy / Hypothyroidism Uncertain exact cause, seems multifactorial, seems could be related to poor sleep, constellation of physical ailments with rib pain discomfort pelvic pain and ovarian cyst affecting her, also she has had some blood in stools as well, cannot rule out anemia or other chemistry abnormality - Seems less likely OSA but seems poor sleep can be contributing factor - Off levothyroxine per endocrinology, suspect some component of her thyroid could be uncontrolled, she is scheduled already for follow-up with them Dr Tyrone Schimke Endo  Plan Check CBC, CMET within 1-2 days, review results, rule out anemia or electrolyte imbalance Offered CXR - declined for now, can reconsider if respiratory status worsens if any way, considered prednisone as well if she has dx exercise induced asthma however does not seem acute flare Anticipate may improve if more rest and thyroid treated, and other pains are improving as well as above  Lastly - strict return criteria reviewed if not improving or new concerns, she should seek help immediately if severe concerns such as persistent dyspnea, cough fever, chest pain tightness - when to seek care or follow up with [place].  No orders of the defined types were placed in this encounter.   Follow up plan: Return in about 1 week (around 02/17/2019), or if symptoms worsen or fail to improve, for fatigue / dyspnea.  Future labs ordered for Thursday 02/12/19   Saralyn Pilar, DO [facility] 02/10/2019, 2:10 PM

## 2019-02-10 NOTE — Progress Notes (Signed)
Vaginal swab positive for yeast and BV orders placed for treatment.   Philip Aspen, CNM

## 2019-02-10 NOTE — Progress Notes (Signed)
Orders placed for Toradol , ovarian cyst, not responding well to motrin.   Philip Aspen, CNM

## 2019-02-12 ENCOUNTER — Other Ambulatory Visit: Payer: PRIVATE HEALTH INSURANCE

## 2019-02-12 ENCOUNTER — Other Ambulatory Visit: Payer: Self-pay

## 2019-02-12 DIAGNOSIS — R5383 Other fatigue: Secondary | ICD-10-CM

## 2019-02-13 ENCOUNTER — Encounter: Payer: Self-pay | Admitting: *Deleted

## 2019-02-13 LAB — COMPLETE METABOLIC PANEL WITH GFR
AG Ratio: 1.7 (calc) (ref 1.0–2.5)
ALT: 10 U/L (ref 6–29)
AST: 11 U/L (ref 10–35)
Albumin: 3.9 g/dL (ref 3.6–5.1)
Alkaline phosphatase (APISO): 39 U/L (ref 31–125)
BUN: 16 mg/dL (ref 7–25)
CO2: 25 mmol/L (ref 20–32)
Calcium: 9.1 mg/dL (ref 8.6–10.2)
Chloride: 105 mmol/L (ref 98–110)
Creat: 0.71 mg/dL (ref 0.50–1.10)
GFR, Est African American: 118 mL/min/{1.73_m2} (ref >=60)
GFR, Est Non African American: 102 mL/min/{1.73_m2} (ref >=60)
Globulin: 2.3 g/dL (calc) (ref 1.9–3.7)
Glucose, Bld: 90 mg/dL (ref 65–99)
Potassium: 4.1 mmol/L (ref 3.5–5.3)
Sodium: 138 mmol/L (ref 135–146)
Total Bilirubin: 0.3 mg/dL (ref 0.2–1.2)
Total Protein: 6.2 g/dL (ref 6.1–8.1)

## 2019-02-13 LAB — CBC WITH DIFFERENTIAL/PLATELET
Absolute Monocytes: 619 cells/uL (ref 200–950)
Basophils Absolute: 26 cells/uL (ref 0–200)
Basophils Relative: 0.3 %
Eosinophils Absolute: 1049 cells/uL — ABNORMAL HIGH (ref 15–500)
Eosinophils Relative: 12.2 %
HCT: 35.9 % (ref 35.0–45.0)
Hemoglobin: 12.2 g/dL (ref 11.7–15.5)
Lymphs Abs: 2090 cells/uL (ref 850–3900)
MCH: 30.4 pg (ref 27.0–33.0)
MCHC: 34 g/dL (ref 32.0–36.0)
MCV: 89.5 fL (ref 80.0–100.0)
MPV: 9.6 fL (ref 7.5–12.5)
Monocytes Relative: 7.2 %
Neutro Abs: 4816 cells/uL (ref 1500–7800)
Neutrophils Relative %: 56 %
Platelets: 357 10*3/uL (ref 140–400)
RBC: 4.01 10*6/uL (ref 3.80–5.10)
RDW: 11.4 % (ref 11.0–15.0)
Total Lymphocyte: 24.3 %
WBC: 8.6 10*3/uL (ref 3.8–10.8)

## 2019-02-26 ENCOUNTER — Ambulatory Visit: Payer: PRIVATE HEALTH INSURANCE | Admitting: Dietician

## 2019-03-04 ENCOUNTER — Other Ambulatory Visit: Payer: Self-pay

## 2019-03-04 ENCOUNTER — Ambulatory Visit
Admission: EM | Admit: 2019-03-04 | Discharge: 2019-03-04 | Disposition: A | Discharge status: Home / Self Care | Payer: PRIVATE HEALTH INSURANCE | Attending: Family Medicine | Admitting: Family Medicine

## 2019-03-04 ENCOUNTER — Ambulatory Visit (INDEPENDENT_AMBULATORY_CARE_PROVIDER_SITE_OTHER): Payer: PRIVATE HEALTH INSURANCE

## 2019-03-04 DIAGNOSIS — M940 Chondrocostal junction syndrome [Tietze]: Secondary | ICD-10-CM | POA: Diagnosis not present

## 2019-03-04 DIAGNOSIS — R05 Cough: Secondary | ICD-10-CM | POA: Diagnosis not present

## 2019-03-04 DIAGNOSIS — U071 COVID-19: Secondary | ICD-10-CM | POA: Diagnosis not present

## 2019-03-04 DIAGNOSIS — J029 Acute pharyngitis, unspecified: Secondary | ICD-10-CM | POA: Diagnosis not present

## 2019-03-04 DIAGNOSIS — R5383 Other fatigue: Secondary | ICD-10-CM | POA: Diagnosis not present

## 2019-03-04 DIAGNOSIS — R059 Cough, unspecified: Secondary | ICD-10-CM

## 2019-03-04 LAB — RAPID STREP SCREEN (MED CTR MEBANE ONLY): Streptococcus, Group A Screen (Direct): NEGATIVE

## 2019-03-04 MED ORDER — NAPROXEN 500 MG PO TABS: 500.0000 mg | ORAL_TABLET | Freq: Two times a day (BID) | ORAL | 0 refills | Status: DC | PRN

## 2019-03-04 MED ORDER — HYDROCODONE-HOMATROPINE 5-1.5 MG/5ML PO SYRP: 5.0000 mL | ORAL_SOLUTION | Freq: Four times a day (QID) | ORAL | 0 refills | Status: DC | PRN

## 2019-03-04 NOTE — ED Provider Notes (Signed)
[facility]    CSN: [id number] Arrival date & time: 03/04/19  1205  History   Chief Complaint Chief Complaint  Patient presents with  . Cough  . Covid Positive   HPI  48 year old female presents with continued symptoms in the setting of COVID-19.  Patient diagnosed with COVID-19 on 11/24.  Patient states that she has been feeling better and then worsened 4 to 5 days ago.  She states that she is having cough, chest pain, back pain.  She also reports sore throat and fatigue.  Chest pain is described as a burning sensation.  Patient states that she has a history of costochondritis.  Patient has been using albuterol inhaler with some relief but no complete resolution.  She is concerned about pneumonia in the setting of COVID-19.  No recent fever.  No known exacerbating factors.  No other reported symptoms.  No other complaints.  PMH, Surgical Hx, Family Hx, Social History reviewed and updated as below.  Past Medical History:  Diagnosis Date  . Allergy    seasonal.  Takes allergy shots at [facility]  . Anemia   . Anxiety   . Arthritis   . Asthma    exercise induced--no inhalers  . Cancer ([place]) 2010   Basal cell skin cancer Lt eye, skin graft above Lt eye  . Depression   . Dysmenorrhea   . Headache   . Hypothyroid    Nodules and Fine needle aspiration  . IBS (irritable bowel syndrome)   . Painful menstrual periods   . Plantar fasciitis   . Sprain of carpal (joint) of wrist     Patient Active Problem List   Diagnosis Date Noted  . Ovarian cyst, left 02/06/2019  . Exercise-induced asthma 04/22/2018  . Monilial vaginitis 02/26/2017  . Vaginal dryness 02/26/2017  . Decreased libido 02/26/2017  . Anxiety with depression 02/26/2017  . Dyspareunia, female 02/26/2017  . S/P TAH (total abdominal hysterectomy) 07/02/2016  . 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 Nocturia 07/21/2015  . Stress incontinence 07/21/2015  . Central  hypothyroidism 06/21/2015  . Allergic rhinitis 06/21/2015  . Obesity (BMI 30-39.9) 06/21/2015    Past Surgical History:  Procedure Laterality Date  . ABDOMINAL HYSTERECTOMY Bilateral 07/02/2016   Procedure: HYSTERECTOMY ABDOMINAL WITH BILATERAL SALPINGECTOMY-MIDLINE INCISION;  Surgeon: Brayton Mars, MD;  Location: [facility] ORS;  Service: Gynecology;  Laterality: Bilateral;  . CHOLECYSTECTOMY  2009  . HERNIA REPAIR  7628   umbilical  . SKIN GRAFT  2010  . TONSILLECTOMY      OB History    Gravida  0   Para  0   Term  0   Preterm  0   AB  0   Living  0     SAB  0   TAB  0   Ectopic  0   Multiple  0   Live Births  0            Home Medications    Prior to Admission medications   Medication Sig Start Date End Date Taking? Authorizing Provider  albuterol (VENTOLIN HFA) 108 (90 Base) MCG/ACT inhaler TAKE 2 PUFFS BY MOUTH EVERY 6 HOURS AS NEEDED FOR WHEEZE OR SHORTNESS OF BREATH 12/09/18  Yes Mikey College, NP  Cholecalciferol (VITAMIN D3 GUMMIES ADULT PO) Take by mouth.   Yes [provider]  COLLAGEN-BORON-HYALURONIC ACID PO Take by mouth.   Yes [provider]  diphenhydrAMINE (BENADRYL ALLERGY) 25  mg capsule Take 25 mg by mouth every 6 (six) hours as needed.   Yes [provider]  EPINEPHrine 0.3 mg/0.3 mL IJ SOAJ injection 0.3 mg as needed.  09/25/08  Yes [provider]  estradiol (ESTRACE) 1 MG tablet Take 1 tablet (1 mg total) by mouth daily. 06/10/18  Yes Shambley, Melody N, CNM  Loratadine 10 MG CAPS Take by mouth.   Yes [provider]  Melatonin 3 MG TABS Take by mouth.   Yes [provider]  Misc Natural Products (OSTEO BI-FLEX ADV TRIPLE ST PO) Take by mouth.   Yes [provider]  Multiple Vitamins-Minerals (MULTIVITAMIN WOMEN PO) Take by mouth.   Yes [provider]  ondansetron (ZOFRAN) 4 MG tablet Take by mouth. 01/29/19  Yes [provider]  oxymetazoline  (AFRIN) 0.05 % nasal spray Place 1 spray into both nostrils 2 (two) times daily.   Yes [provider]  Probiotic Product (TRUBIOTICS) CAPS Take 1 capsule by mouth daily.   Yes [provider]  progesterone (PROMETRIUM) 200 MG capsule Take 1 capsule (200 mg total) by mouth daily. 11/12/18  Yes Shambley, Melody N, CNM  triamcinolone cream (KENALOG) 0.1 % APPLY THIN FILM TWICE DAILY TO ECZEMA UNTIL CLEAR 12/23/18  Yes [provider]  baclofen (LIORESAL) 10 MG tablet TAKE 1/2 TO 1 TABLET BY MOUTH AT BEDTIME AS NEEDED FOR MUSCLE SPASM 05/02/18   Mikey College, NP  HYDROcodone-homatropine [facility]) 5-1.5 MG/5ML syrup Take 5 mLs by mouth every 6 (six) hours as needed for cough. 03/04/19   Coral Spikes, DO  naproxen (NAPROSYN) 500 MG tablet Take 1 tablet (500 mg total) by mouth 2 (two) times daily as needed for moderate pain. 03/04/19   Coral Spikes, DO    Family History Family History  Problem Relation Age of Onset  . Hyperlipidemia Mother   . Hypertension Mother   . Cancer Father        lung CA  . Heart disease Maternal Grandmother   . Stroke Maternal Grandmother   . Hypertension Maternal Grandmother   . Diabetes Maternal Grandmother   . Heart disease Maternal Grandfather   . Diabetes Maternal Grandfather   . Heart attack Maternal Grandfather     Social History Social History   Tobacco Use  . Smoking status: Never Smoker  . Smokeless tobacco: Never Used  Substance Use Topics  . Alcohol use: Not Currently    Comment: rare  . Drug use: No     Allergies   Contrast media [iodinated diagnostic agents], Codeine, and Other   Review of Systems Review of Systems  Constitutional: Positive for fatigue.  Respiratory: Positive for cough and chest tightness.    Physical Exam Triage Vital Signs ED Triage Vitals  Enc Vitals Group     BP 03/04/19 1227 (!) 142/66     Pulse Rate 03/04/19 1227 67     Resp 03/04/19 1227 16     Temp 03/04/19 1227 98.5 F  (36.9 C)     Temp Source 03/04/19 1227 Oral     SpO2 03/04/19 1227 100 %     Weight 03/04/19 1222 260 lb (117.9 kg)     Height 03/04/19 1222 5' 8"  (1.727 m)     Head Circumference --      Peak Flow --      Pain Score 03/04/19 1222 6     Pain Loc --      Pain Edu? --  Excl. in [place]? --    Updated Vital Signs BP (!) 142/66 (BP Location: Left Arm)   Pulse 67   Temp 98.5 F (36.9 C) (Oral)   Resp 16   Ht 5' 8"  (1.727 m)   Wt 117.9 kg   LMP 05/26/2016 (Exact Date)   SpO2 100%   BMI 39.53 kg/m   Visual Acuity Right Eye Distance:   Left Eye Distance:   Bilateral Distance:    Right Eye Near:   Left Eye Near:    Bilateral Near:     Physical Exam Vitals signs reviewed.  Constitutional:      General: She is not in acute distress.    Appearance: Normal appearance. She is not ill-appearing.  HENT:     Head: Normocephalic and atraumatic.  Eyes:     General:        Right eye: No discharge.        Left eye: No discharge.     Conjunctiva/sclera: Conjunctivae normal.  Cardiovascular:     Rate and Rhythm: Normal rate and regular rhythm.     Heart sounds: No murmur.  Pulmonary:     Effort: Pulmonary effort is normal.     Breath sounds: Normal breath sounds. No wheezing or rales.  Chest:     Chest wall: Tenderness present.  Skin:    General: Skin is warm.     Findings: No rash.  Neurological:     Mental Status: She is alert.  Psychiatric:        Mood and Affect: Mood normal.        Behavior: Behavior normal.    UC Treatments / Results  Labs (all labs ordered are listed, but only abnormal results are displayed) Labs Reviewed  RAPID STREP SCREEN ([facility] ONLY)  CULTURE, GROUP A STREP [facility])    EKG   Radiology Dg Chest 2 View  Result Date: 03/04/2019 CLINICAL DATA:  Cough, COVID-19 EXAM: CHEST - 2 VIEW COMPARISON:  04/22/2018 FINDINGS: The heart size and mediastinal contours are within normal limits. Both lungs are clear. The visualized skeletal  structures are unremarkable. IMPRESSION: No acute abnormality of the lungs. Electronically Signed   By: Eddie Candle M.D.   On: 03/04/2019 13:09    Procedures Procedures (including critical care time)  Medications Ordered in UC Medications - No data to display  Initial Impression / Assessment and Plan / UC Course  I have reviewed the triage vital signs and the nursing notes.  Pertinent labs & imaging results that were available during my care of the patient were reviewed by me and considered in my medical decision making (see chart for details).    48 year old female presents with cough and costochondritis.  Patient recently diagnosed with COVID-19.  X-ray negative.  Naproxen as directed.  Hycodan for cough.  Supportive care.  Final Clinical Impressions(s) / UC Diagnoses   Final diagnoses:  COVID-19  Cough  Costochondritis     Discharge Instructions     Medications as prescribed.  Take care  Dr. Lacinda Axon     ED Prescriptions    Medication Sig Dispense Auth. Provider   naproxen (NAPROSYN) 500 MG tablet Take 1 tablet (500 mg total) by mouth 2 (two) times daily as needed for moderate pain. 30 tablet Denelle Capurro G, DO   HYDROcodone-homatropine (HYCODAN) 5-1.5 MG/5ML syrup Take 5 mLs by mouth every 6 (six) hours as needed for cough. 120 mL Coral Spikes, DO     PDMP not reviewed  this encounter.   Coral Spikes, DO 03/04/19 1359

## 2019-03-04 NOTE — ED Triage Notes (Signed)
Patient states that she was tested for Covid 11/24 and was positive had started to feel better and has worsened over the last 4-5 days. Patient states that her back and chest have been painful and she has continued to cough, concerned for pneumonia. Patient states that she has been also noticing an increase in sore throat and wanted to make sure it wasn't something more. Reports that she has been using her albuterol inhaler which provides some relief.

## 2019-03-04 NOTE — Discharge Instructions (Signed)
Medications as prescribed.  Take care  Dr. Lacinda Axon

## 2019-03-07 LAB — CULTURE, GROUP A STREP (THRC)

## 2019-03-30 ENCOUNTER — Other Ambulatory Visit: Payer: Self-pay

## 2019-03-30 MED ORDER — ESTRADIOL 1 MG PO TABS: 1.0000 mg | ORAL_TABLET | Freq: Every day | ORAL | 2 refills | Status: DC

## 2019-03-31 ENCOUNTER — Other Ambulatory Visit: Payer: Self-pay

## 2019-03-31 MED ORDER — ESTRADIOL 1 MG PO TABS: 1.0000 mg | ORAL_TABLET | Freq: Every day | ORAL | 0 refills | Status: DC

## 2019-04-22 ENCOUNTER — Encounter: Payer: Self-pay | Admitting: Emergency Medicine

## 2019-04-22 ENCOUNTER — Other Ambulatory Visit: Payer: Self-pay

## 2019-04-22 ENCOUNTER — Ambulatory Visit
Admission: EM | Admit: 2019-04-22 | Discharge: 2019-04-22 | Disposition: A | Discharge status: Home / Self Care | Payer: PRIVATE HEALTH INSURANCE | Attending: Family Medicine | Admitting: Family Medicine

## 2019-04-22 DIAGNOSIS — R21 Rash and other nonspecific skin eruption: Secondary | ICD-10-CM | POA: Diagnosis not present

## 2019-04-22 DIAGNOSIS — M2550 Pain in unspecified joint: Secondary | ICD-10-CM

## 2019-04-22 MED ORDER — MELOXICAM 15 MG PO TABS: 15.0000 mg | ORAL_TABLET | Freq: Every day | ORAL | 0 refills | Status: DC

## 2019-04-22 NOTE — Discharge Instructions (Signed)
Over the counter cortisone ointment to rash Follow up with Primary Care provider and/or Rheumatologist for further evaluation

## 2019-04-22 NOTE — ED Triage Notes (Signed)
Pt c/o joint pain. Today it is located in her right shoulder and her right hip. Over the last week it has been in her ankle, and knees. No known injury. She also has a rash on her on the inside of her legs and the creases of her legs. The rash has been going on since christmas but has gotten worse over the last 2 weeks.

## 2019-04-22 NOTE — ED Provider Notes (Signed)
[facility]    CSN: [id number] Arrival date & time: 04/22/19  1616      History   Chief Complaint Chief Complaint  Patient presents with  . Joint Pain  . Rash    HPI Caitlin Barrett is a 49 y.o. female.   49 yo female with a c/o right shoulder and right hip pain since yesterday. States that over the past week she's had pain in other joints and the locations change. Over the last week she had pain in her ankles and knees. Denies any injuries, falls, fevers, chills, joint redness or swelling, recent travel, insect bites.  States she's had an itchy rash on her legs for the past month.      Past Medical History:  Diagnosis Date  . Allergy    seasonal.  Takes allergy shots at [facility]  . Anemia   . Anxiety   . Arthritis   . Asthma    exercise induced--no inhalers  . Cancer ([place]) 2010   Basal cell skin cancer Lt eye, skin graft above Lt eye  . Depression   . Dysmenorrhea   . Headache   . Hypothyroid    Nodules and Fine needle aspiration  . IBS (irritable bowel syndrome)   . Painful menstrual periods   . Plantar fasciitis   . Sprain of carpal (joint) of wrist     Patient Active Problem List   Diagnosis Date Noted  . Ovarian cyst, left 02/06/2019  . Exercise-induced asthma 04/22/2018  . Monilial vaginitis 02/26/2017  . Vaginal dryness 02/26/2017  . Decreased libido 02/26/2017  . Anxiety with depression 02/26/2017  . Dyspareunia, female 02/26/2017  . S/P TAH (total abdominal hysterectomy) 07/02/2016  . 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 Nocturia 07/21/2015  . Stress incontinence 07/21/2015  . 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 Allergic rhinitis 06/21/2015  . Obesity (BMI 30-39.9) 06/21/2015    Past Surgical History:  Procedure Laterality Date  . ABDOMINAL HYSTERECTOMY Bilateral 07/02/2016   Procedure: HYSTERECTOMY ABDOMINAL WITH BILATERAL SALPINGECTOMY-MIDLINE INCISION;  Surgeon: Brayton Mars, MD;   Location: [facility] ORS;  Service: Gynecology;  Laterality: Bilateral;  . CHOLECYSTECTOMY  2009  . HERNIA REPAIR  2263   umbilical  . SKIN GRAFT  2010  . TONSILLECTOMY      OB History    Gravida  0   Para  0   Term  0   Preterm  0   AB  0   Living  0     SAB  0   TAB  0   Ectopic  0   Multiple  0   Live Births  0            Home Medications    Prior to Admission medications   Medication Sig Start Date End Date Taking? Authorizing Provider  albuterol (VENTOLIN HFA) 108 (90 Base) MCG/ACT inhaler TAKE 2 PUFFS BY MOUTH EVERY 6 HOURS AS NEEDED FOR WHEEZE OR SHORTNESS OF BREATH 12/09/18  Yes Mikey College, NP  baclofen (LIORESAL) 10 MG tablet TAKE 1/2 TO 1 TABLET BY MOUTH AT BEDTIME AS NEEDED FOR MUSCLE SPASM 05/02/18  Yes Mikey College, NP  COLLAGEN-BORON-HYALURONIC ACID PO Take by mouth.   Yes [provider]  diphenhydrAMINE (BENADRYL ALLERGY) 25 mg capsule Take 25 mg by mouth every 6 (six) hours as needed.   Yes [provider]  EPINEPHrine 0.3 mg/0.3 mL IJ SOAJ injection 0.3 mg as needed.  09/25/08  Yes [provider]  estradiol (ESTRACE) 1 MG tablet Take 1 tablet (1 mg total) by mouth daily. 03/31/19  Yes Philip Aspen, CNM  Loratadine 10 MG CAPS Take by mouth.   Yes [provider]  Melatonin 3 MG TABS Take by mouth.   Yes [provider]  Misc Natural Products (OSTEO BI-FLEX ADV TRIPLE ST PO) Take by mouth.   Yes [provider]  Multiple Vitamins-Minerals (MULTIVITAMIN WOMEN PO) Take by mouth.   Yes [provider]  ondansetron (ZOFRAN) 4 MG tablet Take by mouth. 01/29/19  Yes [provider]  oxymetazoline (AFRIN) 0.05 % nasal spray Place 1 spray into both nostrils 2 (two) times daily.   Yes [provider]  Probiotic Product (TRUBIOTICS) CAPS Take 1 capsule by mouth daily.   Yes [provider]  progesterone (PROMETRIUM) 200 MG capsule Take 1 capsule (200 mg  total) by mouth daily. 11/12/18  Yes Shambley, Melody N, CNM  triamcinolone cream (KENALOG) 0.1 % APPLY THIN FILM TWICE DAILY TO ECZEMA UNTIL CLEAR 12/23/18  Yes [provider]  clobetasol cream (TEMOVATE) 5.27 % Apply 1 application topically 2 (two) times daily. For up to 1-2 weeks then as needed. Do not use on sensitive skin. 04/24/19   Karamalegos, Devonne Doughty, DO  meloxicam (MOBIC) 15 MG tablet Take 1 tablet (15 mg total) by mouth daily. 04/22/19   Norval Gable, MD    Family History Family History  Problem Relation Age of Onset  . Hyperlipidemia Mother   . Hypertension Mother   . Cancer Father        lung CA  . Heart disease Maternal Grandmother   . Stroke Maternal Grandmother   . Hypertension Maternal Grandmother   . Diabetes Maternal Grandmother   . Heart disease Maternal Grandfather   . Diabetes Maternal Grandfather   . Heart attack Maternal Grandfather     Social History Social History   Tobacco Use  . Smoking status: Never Smoker  . Smokeless tobacco: Never Used  Substance Use Topics  . Alcohol use: Not Currently    Comment: rare  . Drug use: No     Allergies   Contrast media [iodinated diagnostic agents], Codeine, and Other   Review of Systems Review of Systems   Physical Exam Triage Vital Signs ED Triage Vitals  Enc Vitals Group     BP 04/22/19 1642 (!) 160/79     Pulse Rate 04/22/19 1642 75     Resp 04/22/19 1642 18     Temp 04/22/19 1642 98.4 F (36.9 C)     Temp Source 04/22/19 1642 Oral     SpO2 04/22/19 1642 100 %     Weight 04/22/19 1637 274 lb (124.3 kg)     Height 04/22/19 1637 5' 9"  (1.753 m)     Head Circumference --      Peak Flow --      Pain Score 04/22/19 1637 4     Pain Loc --      Pain Edu? --      Excl. in [place]? --    No data found.  Updated Vital Signs BP (!) 160/79 (BP Location: Left Arm)   Pulse 75   Temp 98.4 F (36.9 C) (Oral)   Resp 18   Ht 5' 9"  (1.753 m)   Wt 124.3 kg   LMP 05/26/2016 (Exact Date)    SpO2 100%   BMI 40.46 kg/m   Visual Acuity Right Eye Distance:  Left Eye Distance:   Bilateral Distance:    Right Eye Near:   Left Eye Near:    Bilateral Near:     Physical Exam Vitals and nursing note reviewed.  Constitutional:      General: She is not in acute distress.    Appearance: She is not toxic-appearing or diaphoretic.  Cardiovascular:     Rate and Rhythm: Normal rate.  Musculoskeletal:        General: No swelling, tenderness, deformity or signs of injury.     Right lower leg: No edema.     Left lower leg: No edema.     Comments: No joint edema or erythema noted  Skin:    Findings: Erythema and rash present. Rash is macular.     Comments: Rash on inner thighs and creases of legs  Neurological:     Mental Status: She is alert.      UC Treatments / Results  Labs (all labs ordered are listed, but only abnormal results are displayed) Labs Reviewed - No data to display  EKG   Radiology No results found.  Procedures Procedures (including critical care time)  Medications Ordered in UC Medications - No data to display  Initial Impression / Assessment and Plan / UC Course  I have reviewed the triage vital signs and the nursing notes.  Pertinent labs & imaging results that were available during my care of the patient were reviewed by me and considered in my medical decision making (see chart for details).     Final Clinical Impressions(s) / UC Diagnoses   Final diagnoses:  Polyarthralgia  Rash and nonspecific skin eruption     Discharge Instructions     Over the counter cortisone ointment to rash Follow up with Primary Care provider and/or Rheumatologist for further evaluation     ED Prescriptions    Medication Sig Dispense Auth. Provider   meloxicam (MOBIC) 15 MG tablet Take 1 tablet (15 mg total) by mouth daily. 30 tablet Norval Gable, MD      1. diagnosis reviewed with patient 2. rx as per orders above; reviewed possible side  effects, interactions, risks and benefits  3. Recommend supportive treatment as above 4. Follow-up prn if symptoms worsen or don't improve   PDMP not reviewed this encounter.   Norval Gable, MD 04/24/19 [phone]

## 2019-04-24 ENCOUNTER — Encounter: Payer: Self-pay | Admitting: Family Medicine

## 2019-04-24 ENCOUNTER — Ambulatory Visit (INDEPENDENT_AMBULATORY_CARE_PROVIDER_SITE_OTHER): Payer: PRIVATE HEALTH INSURANCE | Admitting: Family Medicine

## 2019-04-24 ENCOUNTER — Other Ambulatory Visit: Payer: Self-pay

## 2019-04-24 DIAGNOSIS — L988 Other specified disorders of the skin and subcutaneous tissue: Secondary | ICD-10-CM

## 2019-04-24 DIAGNOSIS — M2550 Pain in unspecified joint: Secondary | ICD-10-CM | POA: Diagnosis not present

## 2019-04-24 MED ORDER — CLOBETASOL PROPIONATE 0.05 % EX CREA: 1.0000 "application " | TOPICAL_CREAM | Freq: Two times a day (BID) | CUTANEOUS | 1 refills | Status: DC

## 2019-04-24 NOTE — Patient Instructions (Addendum)
  Will do screening / diagnostic panel first  Check - ESR, CRP, Anti CCP, RF, ANA - also add CMET / CBC - will return to our office next week for labs  Add Clobetasol 0.05% cream BID PRN for extremities / trunk, avoid sensitive skin area, - higher potency if possible psoriasis causing skin plaque / and inc itching.  Anticipate if confirm positive screening for rheumatological condition or autoimmune process, will pursue referral as discussed today to Dr Estanislado Pandy [facility] in [place])   Please schedule a Follow-up Appointment to: Return in about 4 days (around 04/28/2019) for labs.  If you have any other questions or concerns, please feel free to call the office or send a message through [place]. You may also schedule an earlier appointment if necessary.  Additionally, you may be receiving a survey about your experience at our office within a few days to 1 week by e-mail or mail. We value your feedback.  Nobie Putnam, DO [place]

## 2019-04-24 NOTE — Progress Notes (Signed)
Virtual Visit via Telephone The purpose of this virtual visit is to provide medical care while limiting exposure to the novel coronavirus (COVID19) for both patient and office staff.  Consent was obtained for phone visit:  Yes.   Answered questions that patient had about telehealth interaction:  Yes.   I discussed the limitations, risks, security and privacy concerns of performing an evaluation and management service by telephone. I also discussed with the patient that there may be a patient responsible charge related to this service. The patient expressed understanding and agreed to proceed.  Patient Location: Home Provider Location: [facility])  ---------------------------------------------------------------------- Chief Complaint  Patient presents with  . Joint Pain    intermittent bilateral ankles, knees, shoulders and arms. The pt state her joint pain worsen after having COVID.   Marland Kitchen Rash    Rash on bilateral legs and crease of her knees, itchy, redness. x 1 mth. She was seen at [facility] and was told that the rash and the joint pain could be related to Rheumatoid arthritis.    S: Reviewed CMA documentation. I have called patient and gathered additional HPI as follows:  Polyarthralgia / Multiple Joint Pain Reports that symptoms started >6 months ago with episodic joint pain. Then she contracted COVID19 in 01/2019, and developed worsening joint aches and pains and myalgias. She has had persistent symptoms now for weeks with multiple joint aches, ankles, knees, shoulders, back, hips intermittently. Also admits stiffness in joints, worse in morning takes some time to get going. - Tried OTC Ibuprofen 610m twice a day PRN some relief. - [facility] they thought she may have had rheumatoid arthritis and gave her rx Meloxicam PRN but still helped but did not resolve the pain.  Generalized Rash Recent problem onset 1 month  approx around christmas time. Tried OTC Hydrocortisone cream PRN helped redness but not itchiness. She has itching generalized areas itching both sides multiple joints. She is not using any products with scents and has not changed. She tried Benadryl PRN   Denies any high risk travel to areas of current concern for COVID19. Denies any known or suspected exposure to person with or possibly with COVID19.   Denies any fevers, chills, sweats, body ache, cough, shortness of breath, sinus pain or pressure, headache, abdominal pain, diarrhea  Past Medical History:  Diagnosis Date  . Allergy    seasonal.  [facility]  . Anemia   . Anxiety   . Arthritis   . Asthma    exercise induced--no inhalers  [place]   Basal cell skin cancer Lt eye, skin graft above Lt eye  . Depression   . Dysmenorrhea   . Headache   . Hypothyroid    Nodules and Fine needle aspiration  . IBS (irritable bowel syndrome)   . Painful menstrual periods   . Plantar fasciitis   . Sprain of carpal (joint) of wrist    Social History   Tobacco Use  . Smoking status: Never Smoker  . Smokeless tobacco: Never Used  Substance Use Topics  . Alcohol use: Not Currently    Comment: rare  . Drug use: No    Current Outpatient Medications:  .  albuterol (VENTOLIN HFA) 108 (90 Base) MCG/ACT inhaler, TAKE 2 PUFFS BY MOUTH EVERY 6 HOURS AS NEEDED FOR WHEEZE OR SHORTNESS OF BREATH, Disp: 18 g, Rfl: 2 .  baclofen (LIORESAL) 10 MG tablet, TAKE 1/2  TO 1 TABLET BY MOUTH AT BEDTIME AS NEEDED FOR MUSCLE SPASM, Disp: 30 tablet, Rfl: 2 .  COLLAGEN-BORON-HYALURONIC ACID PO, Take by mouth., Disp: , Rfl:  .  diphenhydrAMINE (BENADRYL ALLERGY) 25 mg capsule, Take 25 mg by mouth every 6 (six) hours as needed., Disp: , Rfl:  .  EPINEPHrine 0.3 mg/0.3 mL IJ SOAJ injection, 0.3 mg as needed. , Disp: , Rfl:  .  estradiol (ESTRACE) 1 MG tablet, Take 1 tablet (1 mg total) by mouth daily., Disp: 90 tablet, Rfl:  0 .  Loratadine 10 MG CAPS, Take by mouth., Disp: , Rfl:  .  Melatonin 3 MG TABS, Take by mouth., Disp: , Rfl:  .  meloxicam (MOBIC) 15 MG tablet, Take 1 tablet (15 mg total) by mouth daily., Disp: 30 tablet, Rfl: 0 .  Misc Natural Products (OSTEO BI-FLEX ADV TRIPLE ST PO), Take by mouth., Disp: , Rfl:  .  Multiple Vitamins-Minerals (MULTIVITAMIN WOMEN PO), Take by mouth., Disp: , Rfl:  .  ondansetron (ZOFRAN) 4 MG tablet, Take by mouth., Disp: , Rfl:  .  oxymetazoline (AFRIN) 0.05 % nasal spray, Place 1 spray into both nostrils 2 (two) times daily., Disp: , Rfl:  .  Probiotic Product (TRUBIOTICS) CAPS, Take 1 capsule by mouth daily., Disp: , Rfl:  .  progesterone (PROMETRIUM) 200 MG capsule, Take 1 capsule (200 mg total) by mouth daily., Disp: 30 capsule, Rfl: 6 .  clobetasol cream (TEMOVATE) 3.38 %, Apply 1 application topically 2 (two) times daily. For up to 1-2 weeks then as needed. Do not use on sensitive skin., Disp: 30 g, Rfl: 1 .  triamcinolone cream (KENALOG) 0.1 %, APPLY THIN FILM TWICE DAILY TO ECZEMA UNTIL CLEAR, Disp: , Rfl:   Depression screen [facility] 2/9 02/10/2019 12/04/2018 06/19/2018  Decreased Interest 1 0 -  Down, Depressed, Hopeless 1 0 1  PHQ - 2 Score 2 0 1  Altered sleeping 2 - -  Tired, decreased energy 3 - -  Change in appetite 2 - -  Feeling bad or failure about yourself  1 - -  Trouble concentrating 0 - -  Moving slowly or fidgety/restless 2 - -  Suicidal thoughts 0 - -  PHQ-9 Score 12 - -  Difficult doing work/chores Somewhat difficult - -    GAD 7 : Generalized Anxiety Score 02/10/2019 04/22/2018  Nervous, Anxious, on Edge 2 1  Control/stop worrying 2 1  Worry too much - different things 2 1  Trouble relaxing 2 1  Restless 1 0  Easily annoyed or irritable 0 1  Afraid - awful might happen 2 0  Total GAD 7 Score 11 5  Anxiety Difficulty Somewhat difficult Not difficult at all    -------------------------------------------------------------------------- O:  No physical exam performed due to remote telephone encounter.  Lab results reviewed.  Recent Results (from the past 2160 hour(s))  Urine Culture     Status: None   Collection Time: 02/04/19  4:13 PM   Specimen: Urine   UR  Result Value Ref Range   Urine Culture, Routine Final report    Organism ID, Bacteria Comment     Comment: Culture shows less than 10,000 colony forming units of bacteria per milliliter of urine. This colony count is not generally considered to be clinically significant.   POCT Urinalysis Dipstick     Status: None   Collection Time: 02/04/19  4:56 PM  Result Value Ref Range   Color, UA yellow    Clarity, UA clear  Glucose, UA Negative Negative   Bilirubin, UA neg    Ketones, UA neg    Spec Grav, UA 1.010 1.010 - 1.025   Blood, UA small    pH, UA 6.5 5.0 - 8.0   Protein, UA Negative Negative   Urobilinogen, UA 0.2 0.2 or 1.0 E.U./dL   Nitrite, UA neg    Leukocytes, UA Negative Negative   Appearance     Odor    Cervicovaginal ancillary only     Status: Abnormal   Collection Time: 02/06/19  1:54 PM  Result Value Ref Range   Bacterial Vaginitis (gardnerella) Positive (A)    Candida Vaginitis Positive (A)    Candida Glabrata Negative    Comment      Normal Reference Range Bacterial Vaginosis - Negative   Comment Normal Reference Range Candida Species - Negative    Comment Normal Reference Range Candida Galbrata - Negative   Ovarian Malignancy Risk-ROMA     Status: None   Collection Time: 02/06/19  1:59 PM  Result Value Ref Range   Cancer Antigen (CA) 125 10.1 0.0 - 38.1 U/mL    Comment: Roche [employer]ostics Electrochemiluminescence Immunoassay (ECLIA) Values obtained with different assay methods or kits cannot be used interchangeably.  Results cannot be interpreted as absolute evidence of the presence or absence of malignant disease.    HE4 31.9 0.0 - 63.6 pmol/L    Comment: Roche [employer]ostics Electrochemiluminescence Immunoassay (ECLIA) Values  obtained with different assay methods or kits cannot be used interchangeably.  Results cannot be interpreted as absolute evidence of the presence or absence of malignant disease.    Premenopausal ROMA 0.26 See below   Postmenopausal ROMA 0.58 See below   Comment Comment     Comment: The Risk for Ovarian Malignancy Algorithm (ROMA) test is intended to aid in assessing whether a premenopausal or postmenopausal woman who presents with an ovarian adnexal mass is at high or low likelihood of finding malignancy on surgery. ROMA is indicated for women who meet the following criteria: over age 45; ovarian adnexal mass present for which surgery is planned, and not yet referred to an oncologist. ROMA must be interpreted in conjunction with an independent clinical and radiological assessment. The test is not intended as a screening or stand-alone diagnostic assay. HE4 and CA125 values obtained with different assay methods or kits cannot be used interchangeably.   Premenopausal Interp: LOW     Status: None   Collection Time: 02/06/19  1:59 PM  Result Value Ref Range   Premenopausal Interp: LOW Comment     Comment: If the patient is premenopausal, then the premenopausal ROMA score of less than 1.14 is consistent with a low likelihood of finding a malignancy on surgery.   Postmenopausal Interp: LOW     Status: None   Collection Time: 02/06/19  1:59 PM  Result Value Ref Range   Postmenopausal Interp: LOW Comment     Comment: If the patient is postmenopausal, then the postmenopausal ROMA score of less than 2.99 is consistent with a low likelihood of finding a malignancy on surgery.   [facility] - CMET w/ GFR CMP Complete Metabolic Panel physical     Status: None   Collection Time: 02/12/19  8:06 AM  Result Value Ref Range   Glucose, Bld 90 65 - 99 mg/dL    Comment: .            Fasting reference interval .    BUN 16 7 - 25 mg/dL   Creat  0.71 0.50 - 1.10 mg/dL   GFR, Est Non African American  102 > OR = 60 mL/min/1.67m   GFR, Est African American 118 > OR = 60 mL/min/1.759m  BUN/Creatinine Ratio NOT APPLICABLE 6 - 22 (calc)   Sodium 138 135 - 146 mmol/L   Potassium 4.1 3.5 - 5.3 mmol/L   Chloride 105 98 - 110 mmol/L   CO2 25 20 - 32 mmol/L   Calcium 9.1 8.6 - 10.2 mg/dL   Total Protein 6.2 6.1 - 8.1 g/dL   Albumin 3.9 3.6 - 5.1 g/dL   Globulin 2.3 1.9 - 3.7 g/dL (calc)   AG Ratio 1.7 1.0 - 2.5 (calc)   Total Bilirubin 0.3 0.2 - 1.2 mg/dL   Alkaline phosphatase (APISO) 39 31 - 125 U/L   AST 11 10 - 35 U/L   ALT 10 6 - 29 U/L  [facility] - CBC with Differential/Platelet physical     Status: Abnormal   Collection Time: 02/12/19  8:06 AM  Result Value Ref Range   WBC 8.6 3.8 - 10.8 Thousand/uL   RBC 4.01 3.80 - 5.10 Million/uL   Hemoglobin 12.2 11.7 - 15.5 g/dL   HCT 35.9 35.0 - 45.0 %   MCV 89.5 80.0 - 100.0 fL   MCH 30.4 27.0 - 33.0 pg   MCHC 34.0 32.0 - 36.0 g/dL   RDW 11.4 11.0 - 15.0 %   Platelets 357 140 - 400 Thousand/uL   MPV 9.6 7.5 - 12.5 fL   Neutro Abs 4,816 1,500 - 7,800 cells/uL   Lymphs Abs 2,090 850 - 3,900 cells/uL   Absolute Monocytes 619 200 - 950 cells/uL   Eosinophils Absolute 1,049 (H) 15 - 500 cells/uL   Basophils Absolute 26 0 - 200 cells/uL   Neutrophils Relative % 56 %   Total Lymphocyte 24.3 %   Monocytes Relative 7.2 %   Eosinophils Relative 12.2 %   Basophils Relative 0.3 %  Rapid Strep Screen ([facility] ONLY)     Status: None   Collection Time: 03/04/19 12:28 PM   Specimen: Oral Mucosa/Gingiva; Other  Result Value Ref Range   Streptococcus, Group A Screen (Direct) NEGATIVE NEGATIVE    Comment: (NOTE) A Rapid Antigen test may result negative if the antigen level in the sample is below the detection level of this test. The FDA has not cleared this test as a stand-alone test therefore the rapid antigen negative result has reflexed to a Group A Strep culture. Performed at [facility], [address] [place] [id number] Culture, group A strep     Status: None   Collection Time: 03/04/19 12:28 PM   Specimen: Throat  Result Value Ref Range   Specimen Description      THROAT Performed at [facility], [address] [place] [address]  Special Requests      NONE Reflexed from [id number] at [facility], [address] [place]  Culture      NO GROUP A STREP (S.PYOGENES) ISOLATED Performed at [facility] [place] [address]  Report Status 03/07/2019 FINAL     -------------------------------------------------------------------------- A&P:  Problem List Items Addressed This Visit    None    Visit Diagnoses    Polyarthralgia    -  Primary   Relevant Orders   Sed Rate (ESR)   C-reactive protein  Rheumatoid Factor   ANA   Cyclic citrul peptide antibody, IgG   COMPLETE METABOLIC PANEL WITH GFR   CBC with Differential/Platelet   Skin plaque       Relevant Medications   clobetasol cream (TEMOVATE) 0.05 %     Clinical course concerning for possible underlying autoimmune disease with chronic history of some polyarthralgias, now has had significant worsening in past several months following COVID19 infection in 01/2019, seemed to trigger worse multiple bilateral polyarthralgia and in past 1-2 month developed significant rash over extensor surfaces and other areas of body  Fam history of fraternal twin sister with psoriasis  Recent UC visit evaluated for same issue, thought to be autoimmune. Cannot rule out COVID19 provoked autoimmune condition, some research into that has shown mimicking of autoimmune conditions. Or could possibly be unmasking of autoimmune syndrome in setting of COVID  Will do screening / diagnostic panel first  Check - ESR, CRP, Anti CCP, RF, ANA - also add CMET / CBC - will return to our office next week for labs  Add Clobetasol 0.05% cream BID PRN for extremities /  trunk, avoid sensitive skin area, - higher potency if possible psoriasis causing skin plaque / and inc itching.  Anticipate if confirm positive screening for rheumatological condition or autoimmune process, will pursue referral as discussed today to Dr Estanislado Pandy [facility] in [place])  Meds ordered this encounter  Medications  . clobetasol cream (TEMOVATE) 0.05 %    Sig: Apply 1 application topically 2 (two) times daily. For up to 1-2 weeks then as needed. Do not use on sensitive skin.    Dispense:  30 g    Refill:  1    Follow-up: - Return as needed  Patient verbalizes understanding with the above medical recommendations including the limitation of remote medical advice.  Specific follow-up and call-back criteria were given for patient to follow-up or seek medical care more urgently if needed.   - Time spent in direct consultation with patient on phone: 12 minutes   Nobie Putnam, [facility] 04/24/2019, 9:03 AM

## 2019-04-28 ENCOUNTER — Other Ambulatory Visit: Payer: PRIVATE HEALTH INSURANCE

## 2019-04-28 ENCOUNTER — Other Ambulatory Visit: Payer: Self-pay

## 2019-04-28 DIAGNOSIS — M2550 Pain in unspecified joint: Secondary | ICD-10-CM

## 2019-05-01 DIAGNOSIS — L988 Other specified disorders of the skin and subcutaneous tissue: Secondary | ICD-10-CM

## 2019-05-01 DIAGNOSIS — M2550 Pain in unspecified joint: Secondary | ICD-10-CM

## 2019-05-01 LAB — COMPLETE METABOLIC PANEL WITH GFR
AG Ratio: 1.6 (calc) (ref 1.0–2.5)
ALT: 10 U/L (ref 6–29)
AST: 11 U/L (ref 10–35)
Albumin: 3.9 g/dL (ref 3.6–5.1)
Alkaline phosphatase (APISO): 44 U/L (ref 31–125)
BUN: 13 mg/dL (ref 7–25)
CO2: 27 mmol/L (ref 20–32)
Calcium: 9.1 mg/dL (ref 8.6–10.2)
Chloride: 105 mmol/L (ref 98–110)
Creat: 0.81 mg/dL (ref 0.50–1.10)
GFR, Est African American: 101 mL/min/{1.73_m2} (ref >=60)
GFR, Est Non African American: 87 mL/min/{1.73_m2} (ref >=60)
Globulin: 2.4 g/dL (calc) (ref 1.9–3.7)
Glucose, Bld: 92 mg/dL (ref 65–99)
Potassium: 4.3 mmol/L (ref 3.5–5.3)
Sodium: 141 mmol/L (ref 135–146)
Total Bilirubin: 0.4 mg/dL (ref 0.2–1.2)
Total Protein: 6.3 g/dL (ref 6.1–8.1)

## 2019-05-01 LAB — CBC WITH DIFFERENTIAL/PLATELET
Absolute Monocytes: 672 cells/uL (ref 200–950)
Basophils Absolute: 59 cells/uL (ref 0–200)
Basophils Relative: 0.7 %
Eosinophils Absolute: 1226 cells/uL — ABNORMAL HIGH (ref 15–500)
Eosinophils Relative: 14.6 %
HCT: 36.8 % (ref 35.0–45.0)
Hemoglobin: 12.7 g/dL (ref 11.7–15.5)
Lymphs Abs: 1882 cells/uL (ref 850–3900)
MCH: 30.1 pg (ref 27.0–33.0)
MCHC: 34.5 g/dL (ref 32.0–36.0)
MCV: 87.2 fL (ref 80.0–100.0)
MPV: 9.3 fL (ref 7.5–12.5)
Monocytes Relative: 8 %
Neutro Abs: 4561 cells/uL (ref 1500–7800)
Neutrophils Relative %: 54.3 %
Platelets: 372 10*3/uL (ref 140–400)
RBC: 4.22 10*6/uL (ref 3.80–5.10)
RDW: 11.5 % (ref 11.0–15.0)
Total Lymphocyte: 22.4 %
WBC: 8.4 10*3/uL (ref 3.8–10.8)

## 2019-05-01 LAB — RHEUMATOID FACTOR: Rheumatoid fact SerPl-aCnc: <14 IU/mL (ref <14)

## 2019-05-01 LAB — ANA: Anti Nuclear Antibody (ANA): NEGATIVE

## 2019-05-01 LAB — SEDIMENTATION RATE: Sed Rate: 14 mm/h (ref 0–20)

## 2019-05-01 LAB — C-REACTIVE PROTEIN: CRP: 5.7 mg/L (ref <8.0)

## 2019-05-01 LAB — CYCLIC CITRUL PEPTIDE ANTIBODY, IGG: Cyclic Citrullin Peptide Ab: <16 UNITS

## 2019-05-12 ENCOUNTER — Other Ambulatory Visit: Payer: Self-pay

## 2019-05-12 ENCOUNTER — Ambulatory Visit (INDEPENDENT_AMBULATORY_CARE_PROVIDER_SITE_OTHER): Payer: PRIVATE HEALTH INSURANCE | Admitting: Certified Nurse Midwife

## 2019-05-12 ENCOUNTER — Ambulatory Visit (INDEPENDENT_AMBULATORY_CARE_PROVIDER_SITE_OTHER): Payer: PRIVATE HEALTH INSURANCE

## 2019-05-12 ENCOUNTER — Encounter: Payer: Self-pay | Admitting: Certified Nurse Midwife

## 2019-05-12 ENCOUNTER — Other Ambulatory Visit: Payer: Self-pay | Admitting: Certified Nurse Midwife

## 2019-05-12 VITALS — BP 124/89 | HR 73 | Ht 69.0 in | Wt 277.2 lb

## 2019-05-12 DIAGNOSIS — N939 Abnormal uterine and vaginal bleeding, unspecified: Secondary | ICD-10-CM | POA: Diagnosis not present

## 2019-05-12 DIAGNOSIS — Z8742 Personal history of other diseases of the female genital tract: Secondary | ICD-10-CM

## 2019-05-12 NOTE — Progress Notes (Signed)
GYN ENCOUNTER NOTE  Subjective:       Caitlin Barrett is a 49 y.o. G0P0000 female is here for gynecologic evaluation of the following issues:  1. Repeat u/s for follow up of ovarian cyst seen on 02/05/19 :  Left Ovary measures 4.4 x 3.5 x 3.6 cm. It is not normal in appearance. Hypoechoic  lesion with web like septations. No color Doppler with-in lesion. Measuring at 3.4  x 3.1 x 3.1 cm.   Gynecologic History Patient's last menstrual period was 05/26/2016 (exact date). Contraception: status post hysterectomy Last Pap: 2015. Results were: normal Last mammogram: 02/05/2018. Results were: normal  Obstetric History OB History  Gravida Para Term Preterm AB Living  0 0 0 0 0 0  SAB TAB Ectopic Multiple Live Births  0 0 0 0 0    Past Medical History:  Diagnosis Date  . Allergy    seasonal.  Takes allergy shots at [facility]  . Anemia   . Anxiety   . Arthritis   . Asthma    exercise induced--no inhalers  . Cancer (HCC) 2010   Basal cell skin cancer Lt eye, skin graft above Lt eye  . Depression   . Dysmenorrhea   . Headache   . Hypothyroid    Nodules and Fine needle aspiration  . IBS (irritable bowel syndrome)   . Painful menstrual periods   . Plantar fasciitis   . Sprain of carpal (joint) of wrist     Past Surgical History:  Procedure Laterality Date  . ABDOMINAL HYSTERECTOMY Bilateral 07/02/2016   Procedure: HYSTERECTOMY ABDOMINAL WITH BILATERAL SALPINGECTOMY-MIDLINE INCISION;  Surgeon: Herold Harms, MD;  Location: [facility];  Service: Gynecology;  Laterality: Bilateral;  . CHOLECYSTECTOMY  2009  . HERNIA REPAIR  1974   umbilical  . SKIN GRAFT  2010  . TONSILLECTOMY      Current Outpatient Medications on File Prior to Visit  Medication Sig Dispense Refill  . albuterol (VENTOLIN HFA) 108 (90 Base) MCG/ACT inhaler TAKE 2 PUFFS BY MOUTH EVERY 6 HOURS AS NEEDED FOR WHEEZE OR SHORTNESS OF BREATH 18 g 2  . baclofen (LIORESAL) 10 MG tablet TAKE 1/2 TO 1 TABLET BY  MOUTH AT BEDTIME AS NEEDED FOR MUSCLE SPASM 30 tablet 2  . clobetasol cream (TEMOVATE) 0.05 % Apply 1 application topically 2 (two) times daily. For up to 1-2 weeks then as needed. Do not use on sensitive skin. 30 g 1  . COLLAGEN-BORON-HYALURONIC ACID PO Take by mouth.    . diphenhydrAMINE (BENADRYL ALLERGY) 25 mg capsule Take 25 mg by mouth every 6 (six) hours as needed.    Marland Kitchen EPINEPHrine 0.3 mg/0.3 mL IJ SOAJ injection 0.3 mg as needed.     Marland Kitchen estradiol (ESTRACE) 1 MG tablet Take 1 tablet (1 mg total) by mouth daily. 90 tablet 0  . Loratadine 10 MG CAPS Take by mouth.    . Melatonin 3 MG TABS Take by mouth.    . meloxicam (MOBIC) 15 MG tablet Take 1 tablet (15 mg total) by mouth daily. 30 tablet 0  . Misc Natural Products (OSTEO BI-FLEX ADV TRIPLE ST PO) Take by mouth.    . Multiple Vitamins-Minerals (MULTIVITAMIN WOMEN PO) Take by mouth.    . ondansetron (ZOFRAN) 4 MG tablet Take by mouth.    Marland Kitchen oxymetazoline (AFRIN) 0.05 % nasal spray Place 1 spray into both nostrils 2 (two) times daily.    . Probiotic Product (TRUBIOTICS) CAPS Take 1 capsule by mouth daily.    Marland Kitchen  progesterone (PROMETRIUM) 200 MG capsule Take 1 capsule (200 mg total) by mouth daily. 30 capsule 6  . triamcinolone cream (KENALOG) 0.1 % APPLY THIN FILM TWICE DAILY TO ECZEMA UNTIL CLEAR     No current facility-administered medications on file prior to visit.    Allergies  Allergen Reactions  . Contrast Media [Iodinated Diagnostic Agents]   . Codeine Nausea Only  . Other Hives    Uncoded Allergy. Allergen: contrast dye Uncoded Allergy. Allergen: contrast dye    Social History   Socioeconomic History  . Marital status: Married    Spouse name: Not on file  . Number of children: Not on file  . Years of education: Not on file  . Highest education level: Not on file  Occupational History  . Not on file  Tobacco Use  . Smoking status: Never Smoker  . Smokeless tobacco: Never Used  Substance and Sexual Activity  .  Alcohol use: Not Currently    Comment: rare  . Drug use: No  . Sexual activity: Yes    Birth control/protection: Surgical  Other Topics Concern  . Not on file  Social History Narrative  . Not on file   Social Determinants of Health   Financial Resource Strain:   . Difficulty of Paying Living Expenses: Not on file  Food Insecurity:   . Worried About Programme researcher, broadcasting/film/video in the Last Year: Not on file  . Ran Out of Food in the Last Year: Not on file  Transportation Needs:   . Lack of Transportation (Medical): Not on file  . Lack of Transportation (Non-Medical): Not on file  Physical Activity:   . Days of Exercise per Week: Not on file  . Minutes of Exercise per Session: Not on file  Stress:   . Feeling of Stress : Not on file  Social Connections:   . Frequency of Communication with Friends and Family: Not on file  . Frequency of Social Gatherings with Friends and Family: Not on file  . Attends Religious Services: Not on file  . Active Member of Clubs or Organizations: Not on file  . Attends Banker Meetings: Not on file  . Marital Status: Not on file  Intimate Partner Violence:   . Fear of Current or Ex-Partner: Not on file  . Emotionally Abused: Not on file  . Physically Abused: Not on file  . Sexually Abused: Not on file    Family History  Problem Relation Age of Onset  . Hyperlipidemia Mother   . Hypertension Mother   . Cancer Father        lung CA  . Heart disease Maternal Grandmother   . Stroke Maternal Grandmother   . Hypertension Maternal Grandmother   . Diabetes Maternal Grandmother   . Heart disease Maternal Grandfather   . Diabetes Maternal Grandfather   . Heart attack Maternal Grandfather     The following portions of the patient's history were reviewed and updated as appropriate: allergies, current medications, past family history, past medical history, past social history, past surgical history and problem list.  Review of Systems Review  of Systems - Negative except as mentioned above Review of Systems - General ROS: negative for - chills, fatigue, fever, hot flashes, malaise or night sweats Hematological and Lymphatic ROS: negative for - bleeding problems or swollen lymph nodes Gastrointestinal ROS: negative for - abdominal pain, blood in stools, change in bowel habits and nausea/vomiting Musculoskeletal ROS: negative for - joint pain, muscle pain or  muscular weakness Genito-Urinary ROS: negative for - change in menstrual cycle, dysmenorrhea, dyspareunia, dysuria, genital discharge, genital ulcers, hematuria, incontinence, irregular/heavy menses, nocturia or pelvic painjj  Objective:   BP 124/89   Pulse 73   Ht 5\' 9"  (1.753 m)   Wt 277 lb 3 oz (125.7 kg)   LMP 05/26/2016 (Exact Date)   BMI 40.93 kg/m  CONSTITUTIONAL: Well-developed, well-nourished female in no acute distress.  HENT:  Normocephalic, atraumatic.  NECK: Normal range of motion, supple, no masses.  Normal thyroid.  SKIN: Skin is warm and dry. No rash noted. Not diaphoretic. No erythema. No pallor. NEUROLGIC: Alert and oriented to person, place, and time.  PSYCHIATRIC: Normal mood and affect. Normal behavior. Normal judgment and thought content. CARDIOVASCULAR:Not Examined RESPIRATORY: Not Examined BREASTS: Not Examined ABDOMEN: Soft, non distended; Non tender.  No Organomegaly. PELVIC:not indicated MUSCULOSKELETAL: Normal range of motion. No tenderness.  No cyanosis, clubbing, or edema.  Patient Name: Caitlin Barrett DOB: 1970-11-20 MRN: [id number] ULTRASOUND REPORT  Location: [facility]  Date of Service: 05/12/2019     Indications:Pelvic Pain Findings:   Partial hysterectomy X 3 years ago.  Right Ovary measures 3.0 x 1.5 x 1.6  cm. It is normal in appearance. Left Ovary measures 3.1 x 2.0 x 2.2 cm. It is normal in appearance. Survey of the adnexa demonstrates no adnexal masses. There is no free fluid in the cul de  sac.  Impression: 1. Partial hysterectomy X 3 years ago. 2. Both ovaries were visualized and are WNL. 3. No free fluid seen at this time.   Recommendations: 1.Clinical correlation with the patient's History and Physical Exam.   Jenine M. Marciano Sequin    RDMS   Assessment:   History ovarian cyst   Plan:   Reviewed u/s results with pt. Discussed potential of reoccurrence. Pt verbalizes understanding and agrees to plan of care. Follow up for annual exam within the next few months .   I attest more than 50% of visit spent reviewing history, reviewing u/s results, discussing ovarian cysts and treatment options, developing an plan of care. Face to face time 10 min.   Doreene Burke, CNM

## 2019-05-12 NOTE — Patient Instructions (Signed)
Ovarian Cyst     An ovarian cyst is a fluid-filled sac that forms on an ovary. The ovaries are small organs that produce eggs in women. Various types of cysts can form on the ovaries. Some may cause symptoms and require treatment. Most ovarian cysts go away on their own, are not cancerous (are benign), and do not cause problems. Common types of ovarian cysts include:  Functional (follicle) cysts. ? Occur during the menstrual cycle, and usually go away with the next menstrual cycle if you do not get pregnant. ? Usually cause no symptoms.  Endometriomas. ? Are cysts that form from the tissue that lines the uterus (endometrium). ? Are sometimes called "chocolate cysts" because they become filled with blood that turns brown. ? Can cause pain in the lower abdomen during intercourse and during your period.  Cystadenoma cysts. ? Develop from cells on the outside surface of the ovary. ? Can get very large and cause lower abdomen pain and pain with intercourse. ? Can cause severe pain if they twist or break open (rupture).  Dermoid cysts. ? Are sometimes found in both ovaries. ? May contain different kinds of body tissue, such as skin, teeth, hair, or cartilage. ? Usually do not cause symptoms unless they get very big.  Theca lutein cysts. ? Occur when too much of a certain hormone (human chorionic gonadotropin) is produced and overstimulates the ovaries to produce an egg. ? Are most common after having procedures used to assist with the conception of a baby (in vitro fertilization). What are the causes? Ovarian cysts may be caused by:  Ovarian hyperstimulation syndrome. This is a condition that can develop from taking fertility medicines. It causes multiple large ovarian cysts to form.  Polycystic ovarian syndrome (PCOS). This is a common hormonal disorder that can cause ovarian cysts, as well as problems with your period or fertility. What increases the risk? The following factors may  make you more likely to develop ovarian cysts:  Being overweight or obese.  Taking fertility medicines.  Taking certain forms of hormonal birth control.  Smoking. What are the signs or symptoms? Many ovarian cysts do not cause symptoms. If symptoms are present, they may include:  Pelvic pain or pressure.  Pain in the lower abdomen.  Pain during sex.  Abdominal swelling.  Abnormal menstrual periods.  Increasing pain with menstrual periods. How is this diagnosed? These cysts are commonly found during a routine pelvic exam. You may have tests to find out more about the cyst, such as:  Ultrasound.  X-ray of the pelvis.  CT scan.  MRI.  Blood tests. How is this treated? Many ovarian cysts go away on their own without treatment. Your health care provider may want to check your cyst regularly for 2-3 months to see if it changes. If you are in menopause, it is especially important to have your cyst monitored closely because menopausal women have a higher rate of ovarian cancer. When treatment is needed, it may include:  Medicines to help relieve pain.  A procedure to drain the cyst (aspiration).  Surgery to remove the whole cyst.  Hormone treatment or birth control pills. These methods are sometimes used to help dissolve a cyst. Follow these instructions at home:  Take over-the-counter and prescription medicines only as told by your health care provider.  Do not drive or use heavy machinery while taking prescription pain medicine.  Get regular pelvic exams and Pap tests as often as told by your health care provider.  Return to your normal activities as told by your health care provider. Ask your health care provider what activities are safe for you.  Do not use any products that contain nicotine or tobacco, such as cigarettes and e-cigarettes. If you need help quitting, ask your health care provider.  Keep all follow-up visits as told by your health care provider.  This is important. Contact a health care provider if:  Your periods are late, irregular, or painful, or they stop.  You have pelvic pain that does not go away.  You have pressure on your bladder or trouble emptying your bladder completely.  You have pain during sex.  You have any of the following in your abdomen: ? A feeling of fullness. ? Pressure. ? Discomfort. ? Pain that does not go away. ? Swelling.  You feel generally ill.  You become constipated.  You lose your appetite.  You develop severe acne.  You start to have more body hair and facial hair.  You are gaining weight or losing weight without changing your exercise and eating habits.  You think you may be pregnant. Get help right away if:  You have abdominal pain that is severe or gets worse.  You cannot eat or drink without vomiting.  You suddenly develop a fever.  Your menstrual period is much heavier than usual. This information is not intended to replace advice given to you by your health care provider. Make sure you discuss any questions you have with your health care provider. Document Revised: 06/10/2017 Document Reviewed: 08/14/2015 Elsevier Patient Education  2020 [employer].

## 2019-05-20 NOTE — Progress Notes (Signed)
Office Visit Note  Patient: Caitlin Barrett             Date of Birth: 1970/04/23           MRN: [id number]             PCP: Galen Manila, NP (Inactive) Referring: Saralyn Pilar * Visit Date: 05/22/2019 Occupation: @GUAROCC @  Subjective:  Joint pain and rash (Initial Visit)    History of Present Illness: Caitlin Barrett is a 49 y.o. female seen in consultation per request of her PCP.  According to patient she has had history of joint pain for many years.  Most the joint pain she is experienced is related to injuries.  She states she has been experiencing increased joint pain in the last year.  She has also had history of costochondritis in the past.  She has been prescribed meloxicam and baclofen by her doctor.  She states the pain has been more prominent recently.  She has been in discomfort even at rest.  She has discomfort in all of her joints including her neck and lower back.  She states the worst joints for her have been her right shoulder, left knee and right ankle joint.  She has a stiffness and discomfort in her hands.  She notices intermittent swelling in her hands.  She is also noticed a rash on her left lower extremity recently.  She sees a dermatologist for basal cell carcinoma of the skin but has not seen for the rash yet.  There is history of psoriasis in her sister.  There is no history of any other autoimmune disease in the family.  There is no history of oral ulcers, nasal ulcers, malar rash, photosensitivity, and Raynaud's phenomenon.  Activities of Daily Living:  Patient reports morning stiffness for 1.5 hours.   Patient Denies nocturnal pain.  Difficulty dressing/grooming: Reports Difficulty climbing stairs: Reports Difficulty getting out of chair: Denies Difficulty using hands for taps, buttons, cutlery, and/or writing: Denies  Review of Systems  Constitutional: Positive for fatigue. Negative for night sweats, weight gain and weight loss.  HENT:  Positive for mouth dryness. Negative for mouth sores, trouble swallowing, trouble swallowing and nose dryness.   Eyes: Positive for dryness. Negative for pain, redness and visual disturbance.  Respiratory: Positive for shortness of breath. Negative for cough and difficulty breathing.        H/o asthma  Cardiovascular: Negative for chest pain, palpitations, hypertension, irregular heartbeat and swelling in legs/feet.  Gastrointestinal: Positive for constipation. Negative for blood in stool and diarrhea.  Endocrine: Negative for cold intolerance, heat intolerance and increased urination.  Genitourinary: Negative for difficulty urinating and vaginal dryness.  Musculoskeletal: Positive for arthralgias, gait problem, joint pain, morning stiffness and muscle tenderness. Negative for joint swelling, myalgias, muscle weakness and myalgias.  Skin: Positive for rash and sensitivity to sunlight. Negative for color change, hair loss, skin tightness and ulcers.  Allergic/Immunologic: Negative for susceptible to infections.  Neurological: Negative for dizziness, numbness, memory loss, night sweats and weakness.  Hematological: Negative for bruising/bleeding tendency and swollen glands.  Psychiatric/Behavioral: Positive for depressed mood and sleep disturbance. The patient is nervous/anxious.     PMFS History:  Patient Active Problem List   Diagnosis Date Noted  . Ovarian cyst, left 02/06/2019  . Exercise-induced asthma 04/22/2018  . Monilial vaginitis 02/26/2017  . Vaginal dryness 02/26/2017  . Decreased libido 02/26/2017  . Anxiety with depression 02/26/2017  . Dyspareunia, female 02/26/2017  . S/P TAH (  total abdominal hysterectomy) 07/02/2016  . 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 Nocturia 07/21/2015  . Stress incontinence 07/21/2015  . 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 Allergic rhinitis 06/21/2015  . Obesity (BMI 30-39.9) 06/21/2015    Past Medical  History:  Diagnosis Date  . Allergy    seasonal.  Takes allergy shots at [facility]  . Anemia   . Anxiety   . Arthritis   . Asthma    exercise induced--no inhalers  . Cancer (HCC) 2010   Basal cell skin cancer Lt eye, skin graft above Lt eye  . Depression   . Dysmenorrhea   . Headache   . Hypothyroid    Nodules and Fine needle aspiration  . IBS (irritable bowel syndrome)   . Painful menstrual periods   . Plantar fasciitis   . Sprain of carpal (joint) of wrist     Family History  Problem Relation Age of Onset  . Hyperlipidemia Mother   . Hypertension Mother   . Cancer Father        lung CA  . Heart disease Maternal Grandmother   . Stroke Maternal Grandmother   . Hypertension Maternal Grandmother   . Diabetes Maternal Grandmother   . Heart disease Maternal Grandfather   . Diabetes Maternal Grandfather   . Heart attack Maternal Grandfather    Past Surgical History:  Procedure Laterality Date  . ABDOMINAL HYSTERECTOMY Bilateral 07/02/2016   Procedure: HYSTERECTOMY ABDOMINAL WITH BILATERAL SALPINGECTOMY-MIDLINE INCISION;  Surgeon: Herold Harms, MD;  Location: [facility] ORS;  Service: Gynecology;  Laterality: Bilateral;  . CHOLECYSTECTOMY  2009  . HERNIA REPAIR  1974   umbilical  . SKIN GRAFT  2010  . TONSILLECTOMY     Social History   Social History Narrative  . Not on file   Immunization History  Administered Date(s) Administered  . Influenza Nasal 01/16/2018  . Tdap 06/14/2017     Objective: Vital Signs: BP 139/77 (BP Location: Right Arm, Patient Position: Sitting, Cuff Size: Normal)   Pulse 73   Resp 14   Ht 5\' 8"  (1.727 m)   Wt 274 lb 12.8 oz (124.6 kg)   LMP 05/26/2016 (Exact Date)   BMI 41.78 kg/m    Physical Exam Vitals and nursing note reviewed.  Constitutional:      Appearance: She is well-developed.  HENT:     Head: Normocephalic and atraumatic.  Eyes:     Conjunctiva/sclera: Conjunctivae normal.  Cardiovascular:     Rate and Rhythm:  Normal rate and regular rhythm.     Heart sounds: Normal heart sounds.  Pulmonary:     Effort: Pulmonary effort is normal.     Breath sounds: Normal breath sounds.  Abdominal:     General: Bowel sounds are normal.     Palpations: Abdomen is soft.  Musculoskeletal:     Cervical back: Normal range of motion.  Lymphadenopathy:     Cervical: No cervical adenopathy.  Skin:    General: Skin is warm and dry.     Capillary Refill: Capillary refill takes less than 2 seconds.     Findings: Rash present.     Comments: patch of dry skin with erythema noted on left leg.  Neurological:     Mental Status: She is alert and oriented to person, place, and time.  Psychiatric:        Behavior: Behavior normal.      Musculoskeletal Exam: C-spine, thoracic and lumbar spine with good range of  motion.  She had mild SI joint tenderness.  Shoulder joints, elbow joints, wrist joints, MCPs, PIPs and DIPs been good range of motion with no synovitis.  Hip joints, knee joints, ankles, MTPs and PIPs with good range of motion without synovitis.  She has bilateral first MTP thickening consistent with hallux rigidus.  Some DIP and PIP thickening was also noted.  CDAI Exam: CDAI Score: -- Patient Global: --; Provider Global: -- Swollen: --; Tender: -- Joint Exam 05/22/2019   No joint exam has been documented for this visit   There is currently no information documented on the homunculus. Go to the Rheumatology activity and complete the homunculus joint exam.  Investigation: No additional findings.  Imaging: No results found.  Recent Labs: Lab Results  Component Value Date   WBC 8.4 04/28/2019   HGB 12.7 04/28/2019   PLT 372 04/28/2019   NA 141 04/28/2019   K 4.3 04/28/2019   CL 105 04/28/2019   CO2 27 04/28/2019   GLUCOSE 92 04/28/2019   BUN 13 04/28/2019   CREATININE 0.81 04/28/2019   BILITOT 0.4 04/28/2019   ALKPHOS 47 10/01/2016   AST 11 04/28/2019   ALT 10 04/28/2019   PROT 6.3 04/28/2019    ALBUMIN 4.0 10/01/2016   CALCIUM 9.1 04/28/2019   GFRAA 101 04/28/2019    Speciality Comments: No specialty comments available.  Procedures:  No procedures performed Allergies: Contrast media [iodinated diagnostic agents], Codeine, and Other   Assessment / Plan:     Visit Diagnoses: Polyarthralgia -patient gives history of joint pain for many years.  She states pain has increased in the last few months.  She also gives history of intermittent swelling.  No synovitis was noted on the examination.  04/28/19: ANA-, RF-, CCP-, CRP 5.7, ESR 14  Myalgia-she complains of pain and discomfort in her muscles.  She also complains of hyperalgesia.  She may have a component of myofascial pain.  Chronic right shoulder pain -she has pain and discomfort in her right shoulder joint.  She also gives history of nocturnal pain.  Plan: XR Shoulder Right.  X-ray of the shoulder joint was unremarkable.  Pain in both hands -she complains of pain and discomfort in her bilateral hands with intermittent swelling.  No synovitis was noted on the examination today.  Plan: XR Hand 2 View Right, XR Hand 2 View Left.  X-ray of bilateral hands were unremarkable.  Chronic SI joint pain -she has some tenderness on palpation of the SI joints.  She complains of discomfort in her lower back.  She has seen a chiropractor in the past.  Plan: XR Pelvis 1-2 Views.  SI joint x-ray was unremarkable.  Chronic pain of left knee -she complains of chronic left knee discomfort.  No warmth swelling or effusion was noted.  Plan: XR KNEE 3 VIEW LEFT.  X-ray showed moderate osteoarthritis and moderate chondromalacia patella.  Pain in right ankle and joints of right foot -he complains of discomfort in her right ankle joint.  No warmth swelling or effusion was noted.  Plan: XR Ankle 2 Views Right..  Ankle joint x-ray was unremarkable.  I will obtain labs to evaluate this further.  Skin plaque-she has 2 patches of dry skin mild erythema over her  left thigh which appears to be consistent with eczema.  I have advised her to see her dermatologist.  As there is positive family history of psoriasis.  Morton's neuroma of right foot-in the past it is not very symptomatic currently.  Exercise-induced asthma  Central hypothyroidism  Anxiety with depression-currently not on medications.  History of IBS  Basal cell carcinoma (BCC) of skin of other part of face - 2009 left eyebrow  S/P TAH (total abdominal hysterectomy)  Family history of psoriasis in sister  Orders: Orders Placed This Encounter  Procedures  . XR KNEE 3 VIEW LEFT  . XR Shoulder Right  . XR Ankle 2 Views Right  . XR Hand 2 View Right  . XR Hand 2 View Left  . XR Pelvis 1-2 Views  . CK  . TSH  . Uric acid  . Angiotensin converting enzyme  . HLA-B27 antigen  . 14-3-3 eta Protein   No orders of the defined types were placed in this encounter.   Face-to-face time spent with patient was 50 minutes. Greater than 50% of time was spent in counseling and coordination of care.  Follow-Up Instructions: No follow-ups on file.   Pollyann Savoy, MD  Note - This record has been created using Animal nutritionist.  Chart creation errors have been sought, but may not always  have been located. Such creation errors do not reflect on  the standard of medical care.

## 2019-05-22 ENCOUNTER — Ambulatory Visit: Payer: Self-pay

## 2019-05-22 ENCOUNTER — Encounter: Payer: Self-pay | Admitting: Rheumatology

## 2019-05-22 ENCOUNTER — Other Ambulatory Visit: Payer: Self-pay

## 2019-05-22 ENCOUNTER — Ambulatory Visit: Payer: PRIVATE HEALTH INSURANCE | Admitting: Rheumatology

## 2019-05-22 VITALS — BP 139/77 | HR 73 | Resp 14 | Ht 68.0 in | Wt 274.8 lb

## 2019-05-22 DIAGNOSIS — M791 Myalgia, unspecified site: Secondary | ICD-10-CM

## 2019-05-22 DIAGNOSIS — M25511 Pain in right shoulder: Secondary | ICD-10-CM | POA: Diagnosis not present

## 2019-05-22 DIAGNOSIS — M79641 Pain in right hand: Secondary | ICD-10-CM | POA: Diagnosis not present

## 2019-05-22 DIAGNOSIS — M25571 Pain in right ankle and joints of right foot: Secondary | ICD-10-CM | POA: Diagnosis not present

## 2019-05-22 DIAGNOSIS — J45990 Exercise induced bronchospasm: Secondary | ICD-10-CM

## 2019-05-22 DIAGNOSIS — M533 Sacrococcygeal disorders, not elsewhere classified: Secondary | ICD-10-CM | POA: Diagnosis not present

## 2019-05-22 DIAGNOSIS — C44319 Basal cell carcinoma of skin of other parts of face: Secondary | ICD-10-CM

## 2019-05-22 DIAGNOSIS — M2550 Pain in unspecified joint: Secondary | ICD-10-CM | POA: Diagnosis not present

## 2019-05-22 DIAGNOSIS — G8929 Other chronic pain: Secondary | ICD-10-CM

## 2019-05-22 DIAGNOSIS — Z8719 Personal history of other diseases of the digestive system: Secondary | ICD-10-CM

## 2019-05-22 DIAGNOSIS — M79642 Pain in left hand: Secondary | ICD-10-CM | POA: Diagnosis not present

## 2019-05-22 DIAGNOSIS — Z90710 Acquired absence of both cervix and uterus: Secondary | ICD-10-CM

## 2019-05-22 DIAGNOSIS — M25562 Pain in left knee: Secondary | ICD-10-CM | POA: Diagnosis not present

## 2019-05-22 DIAGNOSIS — F418 Other specified anxiety disorders: Secondary | ICD-10-CM

## 2019-05-22 DIAGNOSIS — Z840 Family history of diseases of the skin and subcutaneous tissue: Secondary | ICD-10-CM

## 2019-05-22 DIAGNOSIS — E038 Other specified hypothyroidism: Secondary | ICD-10-CM

## 2019-05-22 DIAGNOSIS — G5761 Lesion of plantar nerve, right lower limb: Secondary | ICD-10-CM

## 2019-05-22 DIAGNOSIS — L988 Other specified disorders of the skin and subcutaneous tissue: Secondary | ICD-10-CM

## 2019-05-25 NOTE — Progress Notes (Signed)
Labs show hyperthyroidism.  Please forward labs to her PCP.

## 2019-05-26 NOTE — Progress Notes (Signed)
I will discuss labs at the follow-up visit.

## 2019-06-01 ENCOUNTER — Other Ambulatory Visit: Payer: Self-pay

## 2019-06-01 LAB — 14-3-3 ETA PROTEIN: 14-3-3 eta Protein: <0.2 ng/mL (ref <0.2)

## 2019-06-01 LAB — ANGIOTENSIN CONVERTING ENZYME: Angiotensin-Converting Enzyme: 19 U/L (ref 9–67)

## 2019-06-01 LAB — HLA-B27 ANTIGEN: HLA-B27 Antigen: NEGATIVE

## 2019-06-01 LAB — URIC ACID: Uric Acid, Serum: 5.2 mg/dL (ref 2.5–7.0)

## 2019-06-01 LAB — TSH: TSH: 0.13 mIU/L — ABNORMAL LOW

## 2019-06-01 LAB — CK: Total CK: 35 U/L (ref 29–143)

## 2019-06-01 MED ORDER — PROGESTERONE MICRONIZED 200 MG PO CAPS: 200.0000 mg | ORAL_CAPSULE | Freq: Every day | ORAL | 6 refills | Status: DC

## 2019-06-01 NOTE — Telephone Encounter (Signed)
Refill on progesterone refilled per faxed request from [facility] in [place].

## 2019-06-19 NOTE — Progress Notes (Signed)
Office Visit Note  Patient: Caitlin Barrett             Date of Birth: 1970-09-22           MRN: [id number]             PCP: Galen Manila, NP (Inactive) Referring: No ref. provider found Visit Date: 06/24/2019 Occupation: @GUAROCC @  Subjective:  Pain in joints and muscles.   History of Present Illness: Caitlin Barrett is a 49 y.o. female with history of osteoarthritis and myofascial pain.  She gives history of ongoing pain and discomfort.  She states all her muscles are tender.  She continues to have some discomfort in her shoulders, her hands and her left knee joint.  She has difficulty climbing stairs due to left knee joint pain.  She denies any joint swelling.  Activities of Daily Living:  Patient reports morning stiffness for 30 minutes.   Patient Reports nocturnal pain.  Difficulty dressing/grooming: Denies Difficulty climbing stairs: Reports Difficulty getting out of chair: Denies Difficulty using hands for taps, buttons, cutlery, and/or writing: Denies  Review of Systems  Constitutional: Positive for fatigue.  HENT: Positive for mouth dryness. Negative for mouth sores and nose dryness.   Eyes: Positive for itching. Negative for dryness.  Respiratory: Positive for shortness of breath. Negative for difficulty breathing.   Cardiovascular: Positive for swelling in legs/feet. Negative for chest pain and palpitations.  Gastrointestinal: Positive for blood in stool and constipation. Negative for diarrhea.  Endocrine: Negative for increased urination.  Genitourinary: Negative for difficulty urinating and painful urination.  Musculoskeletal: Positive for arthralgias, joint pain, myalgias, morning stiffness, muscle tenderness and myalgias. Negative for joint swelling.  Skin: Positive for rash. Negative for hair loss.  Allergic/Immunologic: Negative for susceptible to infections.  Neurological: Positive for dizziness, numbness, headaches and weakness. Negative for memory  loss.  Hematological: Positive for bruising/bleeding tendency.  Psychiatric/Behavioral: Negative for confusion.    PMFS History:  Patient Active Problem List   Diagnosis Date Noted  . Ovarian cyst, left 02/06/2019  . Exercise-induced asthma 04/22/2018  . Monilial vaginitis 02/26/2017  . Vaginal dryness 02/26/2017  . Decreased libido 02/26/2017  . Anxiety with depression 02/26/2017  . Dyspareunia, female 02/26/2017  . S/P TAH (total abdominal hysterectomy) 07/02/2016  . 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 Nocturia 07/21/2015  . Stress incontinence 07/21/2015  . 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 Allergic rhinitis 06/21/2015  . Obesity (BMI 30-39.9) 06/21/2015    Past Medical History:  Diagnosis Date  . Allergy    seasonal.  Takes allergy shots at [facility]  . Anemia   . Anxiety   . Arthritis   . Asthma    exercise induced--no inhalers  . Cancer (HCC) 2010   Basal cell skin cancer Lt eye, skin graft above Lt eye  . Depression   . Dysmenorrhea   . Headache   . Hypothyroid    Nodules and Fine needle aspiration  . IBS (irritable bowel syndrome)   . Painful menstrual periods   . Plantar fasciitis   . Sprain of carpal (joint) of wrist     Family History  Problem Relation Age of Onset  . Hyperlipidemia Mother   . Hypertension Mother   . Cancer Father        lung CA  . Heart disease Maternal Grandmother   . Stroke Maternal Grandmother   . Hypertension Maternal Grandmother   . Diabetes Maternal Grandmother   .  Heart disease Maternal Grandfather   . Diabetes Maternal Grandfather   . Heart attack Maternal Grandfather    Past Surgical History:  Procedure Laterality Date  . ABDOMINAL HYSTERECTOMY Bilateral 07/02/2016   Procedure: HYSTERECTOMY ABDOMINAL WITH BILATERAL SALPINGECTOMY-MIDLINE INCISION;  Surgeon: Herold Harms, MD;  Location: [facility] ORS;  Service: Gynecology;  Laterality: Bilateral;  . CHOLECYSTECTOMY  2009  .  HERNIA REPAIR  1974   umbilical  . SKIN GRAFT  2010  . TONSILLECTOMY     Social History   Social History Narrative  . Not on file   Immunization History  Administered Date(s) Administered  . Influenza Nasal 01/16/2018  . Tdap 06/14/2017     Objective: Vital Signs: BP (!) 172/99 (BP Location: Left Wrist, Patient Position: Sitting, Cuff Size: Normal)   Pulse 69   Resp 16   Ht 5' 8.5" (1.74 m)   Wt 280 lb (127 kg)   LMP 05/26/2016 (Exact Date)   BMI 41.95 kg/m    Physical Exam Vitals and nursing note reviewed.  Constitutional:      Appearance: She is well-developed.  HENT:     Head: Normocephalic and atraumatic.  Eyes:     Conjunctiva/sclera: Conjunctivae normal.  Cardiovascular:     Rate and Rhythm: Normal rate and regular rhythm.     Heart sounds: Normal heart sounds.  Pulmonary:     Effort: Pulmonary effort is normal.     Breath sounds: Normal breath sounds.  Abdominal:     General: Bowel sounds are normal.     Palpations: Abdomen is soft.  Musculoskeletal:     Cervical back: Normal range of motion.  Lymphadenopathy:     Cervical: No cervical adenopathy.  Skin:    General: Skin is warm and dry.     Capillary Refill: Capillary refill takes less than 2 seconds.  Neurological:     Mental Status: She is alert and oriented to person, place, and time.  Psychiatric:        Behavior: Behavior normal.      Musculoskeletal Exam: C-spine was in good range of motion.  Shoulder joints and elbow joints with good range of motion.  Wrist joint MCPs PIPs DIPs with good range of motion with no synovitis.  She has generalized hyperalgesia and positive tender points.  She has crepitus in her left knee joint without any warmth swelling or effusion.  There is no tenderness over ankle joints or MTPs.  CDAI Exam: CDAI Score: -- Patient Global: --; Provider Global: -- Swollen: --; Tender: -- Joint Exam 06/24/2019   No joint exam has been documented for this visit   There is  currently no information documented on the homunculus. Go to the Rheumatology activity and complete the homunculus joint exam.  Investigation: No additional findings.  Imaging: No results found.  Recent Labs: Lab Results  Component Value Date   WBC 8.4 04/28/2019   HGB 12.7 04/28/2019   PLT 372 04/28/2019   NA 141 04/28/2019   K 4.3 04/28/2019   CL 105 04/28/2019   CO2 27 04/28/2019   GLUCOSE 92 04/28/2019   BUN 13 04/28/2019   CREATININE 0.81 04/28/2019   BILITOT 0.4 04/28/2019   ALKPHOS 47 10/01/2016   AST 11 04/28/2019   ALT 10 04/28/2019   PROT 6.3 04/28/2019   ALBUMIN 4.0 10/01/2016   CALCIUM 9.1 04/28/2019   GFRAA 101 04/28/2019  May 22, 2019 CK 35, TSH 0.13, uric acid 5.2, ACE normal, HLA-B27 negative, 14 3 3  eta  negative  04/28/19: ANA-, RF-, CCP-, CRP 5.7, ESR 14  Speciality Comments: No specialty comments available.  Procedures:  No procedures performed Allergies: Contrast media [iodinated diagnostic agents], Codeine, and Other   Assessment / Plan:     Visit Diagnoses: Myofascial pain-she has generalized pain and discomfort with positive tender points.  Need for regular exercise was emphasized.  Good sleep hygiene was discussed.  I also encouraged water aerobics if available.  Polyarthralgia-she has pain in multiple joints but no synovitis.  Chronic right shoulder pain - X-rays were unremarkable.  Pain in both hands - X-rays are within normal limits.  Chronic SI joint pain - X-rays were unremarkable.  Primary osteoarthritis of left knee - Moderate osteoarthritis and moderate chondromalacia patella.  I have given her a handout on knee joint exercises.  Weight loss diet and exercise was emphasized.  Have given her information on weight loss clinic.  Natural anti-inflammatories was also discussed.  Pain in right ankle and joints of right foot - X-rays were unremarkable.  Morton's neuroma of right foot-she has discomfort off and on.  Skin plaque  -patient states that she was seen by dermatologist to diagnosed the rash as atypical dermatitis by her dermatologist.  She also had hyperkeratosis on her feet.  Basal cell carcinoma (BCC) of skin of other part of face  Exercise-induced asthma  Anxiety with depression  Central hypothyroidism  History of IBS  Family history of psoriasis in sister  Orders: No orders of the defined types were placed in this encounter.  No orders of the defined types were placed in this encounter.   Follow-Up Instructions: Return if symptoms worsen or fail to improve, for Osteoarthritis.   Pollyann Savoy, MD  Note - This record has been created using Animal nutritionist.  Chart creation errors have been sought, but may not always  have been located. Such creation errors do not reflect on  the standard of medical care.

## 2019-06-24 ENCOUNTER — Other Ambulatory Visit: Payer: Self-pay

## 2019-06-24 ENCOUNTER — Encounter: Payer: Self-pay | Admitting: Rheumatology

## 2019-06-24 ENCOUNTER — Ambulatory Visit: Payer: PRIVATE HEALTH INSURANCE | Admitting: Rheumatology

## 2019-06-24 VITALS — BP 172/99 | HR 69 | Resp 16 | Ht 68.5 in | Wt 280.0 lb

## 2019-06-24 DIAGNOSIS — M25571 Pain in right ankle and joints of right foot: Secondary | ICD-10-CM

## 2019-06-24 DIAGNOSIS — M2550 Pain in unspecified joint: Secondary | ICD-10-CM

## 2019-06-24 DIAGNOSIS — Z840 Family history of diseases of the skin and subcutaneous tissue: Secondary | ICD-10-CM

## 2019-06-24 DIAGNOSIS — M25511 Pain in right shoulder: Secondary | ICD-10-CM

## 2019-06-24 DIAGNOSIS — G5761 Lesion of plantar nerve, right lower limb: Secondary | ICD-10-CM

## 2019-06-24 DIAGNOSIS — M79642 Pain in left hand: Secondary | ICD-10-CM

## 2019-06-24 DIAGNOSIS — M7918 Myalgia, other site: Secondary | ICD-10-CM

## 2019-06-24 DIAGNOSIS — C44319 Basal cell carcinoma of skin of other parts of face: Secondary | ICD-10-CM

## 2019-06-24 DIAGNOSIS — L988 Other specified disorders of the skin and subcutaneous tissue: Secondary | ICD-10-CM

## 2019-06-24 DIAGNOSIS — F418 Other specified anxiety disorders: Secondary | ICD-10-CM

## 2019-06-24 DIAGNOSIS — J45990 Exercise induced bronchospasm: Secondary | ICD-10-CM

## 2019-06-24 DIAGNOSIS — G8929 Other chronic pain: Secondary | ICD-10-CM

## 2019-06-24 DIAGNOSIS — E038 Other specified hypothyroidism: Secondary | ICD-10-CM

## 2019-06-24 DIAGNOSIS — M533 Sacrococcygeal disorders, not elsewhere classified: Secondary | ICD-10-CM

## 2019-06-24 DIAGNOSIS — M79641 Pain in right hand: Secondary | ICD-10-CM | POA: Diagnosis not present

## 2019-06-24 DIAGNOSIS — Z8719 Personal history of other diseases of the digestive system: Secondary | ICD-10-CM

## 2019-06-24 DIAGNOSIS — M1712 Unilateral primary osteoarthritis, left knee: Secondary | ICD-10-CM

## 2019-06-24 NOTE — Patient Instructions (Signed)
Journal for Nurse Practitioners, 15(4), [phone]. Retrieved December 30, 2017 from http://clinicalkey.com/nursing">  Knee Exercises Ask your health care provider which exercises are safe for you. Do exercises exactly as told by your health care provider and adjust them as directed. It is normal to feel mild stretching, pulling, tightness, or discomfort as you do these exercises. Stop right away if you feel sudden pain or your pain gets worse. Do not begin these exercises until told by your health care provider. Stretching and range-of-motion exercises These exercises warm up your muscles and joints and improve the movement and flexibility of your knee. These exercises also help to relieve pain and swelling. Knee extension, prone 1. Lie on your abdomen (prone position) on a bed. 2. Place your left / right knee just beyond the edge of the surface so your knee is not on the bed. You can put a towel under your left / right thigh just above your kneecap for comfort. 3. Relax your leg muscles and allow gravity to straighten your knee (extension). You should feel a stretch behind your left / right knee. 4. Hold this position for __________ seconds. 5. Scoot up so your knee is supported between repetitions. Repeat __________ times. Complete this exercise __________ times a day. Knee flexion, active  1. Lie on your back with both legs straight. If this causes back discomfort, bend your left / right knee so your foot is flat on the floor. 2. Slowly slide your left / right heel back toward your buttocks. Stop when you feel a gentle stretch in the front of your knee or thigh (flexion). 3. Hold this position for __________ seconds. 4. Slowly slide your left / right heel back to the starting position. Repeat __________ times. Complete this exercise __________ times a day. Quadriceps stretch, prone  1. Lie on your abdomen on a firm surface, such as a bed or padded floor. 2. Bend your left / right knee and hold  your ankle. If you cannot reach your ankle or pant leg, loop a belt around your foot and grab the belt instead. 3. Gently pull your heel toward your buttocks. Your knee should not slide out to the side. You should feel a stretch in the front of your thigh and knee (quadriceps). 4. Hold this position for __________ seconds. Repeat __________ times. Complete this exercise __________ times a day. Hamstring, supine 1. Lie on your back (supine position). 2. Loop a belt or towel over the ball of your left / right foot. The ball of your foot is on the walking surface, right under your toes. 3. Straighten your left / right knee and slowly pull on the belt to raise your leg until you feel a gentle stretch behind your knee (hamstring). ? Do not let your knee bend while you do this. ? Keep your other leg flat on the floor. 4. Hold this position for __________ seconds. Repeat __________ times. Complete this exercise __________ times a day. Strengthening exercises These exercises build strength and endurance in your knee. Endurance is the ability to use your muscles for a long time, even after they get tired. Quadriceps, isometric This exercise stretches the muscles in front of your thigh (quadriceps) without moving your knee joint (isometric). 1. Lie on your back with your left / right leg extended and your other knee bent. Put a rolled towel or small pillow under your knee if told by your health care provider. 2. Slowly tense the muscles in the front of your left /  right thigh. You should see your kneecap slide up toward your hip or see increased dimpling just above the knee. This motion will push the back of the knee toward the floor. 3. For __________ seconds, hold the muscle as tight as you can without increasing your pain. 4. Relax the muscles slowly and completely. Repeat __________ times. Complete this exercise __________ times a day. Straight leg raises This exercise stretches the muscles in front  of your thigh (quadriceps) and the muscles that move your hips (hip flexors). 1. Lie on your back with your left / right leg extended and your other knee bent. 2. Tense the muscles in the front of your left / right thigh. You should see your kneecap slide up or see increased dimpling just above the knee. Your thigh may even shake a bit. 3. Keep these muscles tight as you raise your leg 4-6 inches (10-15 cm) off the floor. Do not let your knee bend. 4. Hold this position for __________ seconds. 5. Keep these muscles tense as you lower your leg. 6. Relax your muscles slowly and completely after each repetition. Repeat __________ times. Complete this exercise __________ times a day. Hamstring, isometric 1. Lie on your back on a firm surface. 2. Bend your left / right knee about __________ degrees. 3. Dig your left / right heel into the surface as if you are trying to pull it toward your buttocks. Tighten the muscles in the back of your thighs (hamstring) to "dig" as hard as you can without increasing any pain. 4. Hold this position for __________ seconds. 5. Release the tension gradually and allow your muscles to relax completely for __________ seconds after each repetition. Repeat __________ times. Complete this exercise __________ times a day. Hamstring curls If told by your health care provider, do this exercise while wearing ankle weights. Begin with __________ lb weights. Then increase the weight by 1 lb (0.5 kg) increments. Do not wear ankle weights that are more than __________ lb. 1. Lie on your abdomen with your legs straight. 2. Bend your left / right knee as far as you can without feeling pain. Keep your hips flat against the floor. 3. Hold this position for __________ seconds. 4. Slowly lower your leg to the starting position. Repeat __________ times. Complete this exercise __________ times a day. Squats This exercise strengthens the muscles in front of your thigh and knee  (quadriceps). 1. Stand in front of a table, with your feet and knees pointing straight ahead. You may rest your hands on the table for balance but not for support. 2. Slowly bend your knees and lower your hips like you are going to sit in a chair. ? Keep your weight over your heels, not over your toes. ? Keep your lower legs upright so they are parallel with the table legs. ? Do not let your hips go lower than your knees. ? Do not bend lower than told by your health care provider. ? If your knee pain increases, do not bend as low. 3. Hold the squat position for __________ seconds. 4. Slowly push with your legs to return to standing. Do not use your hands to pull yourself to standing. Repeat __________ times. Complete this exercise __________ times a day. Wall slides This exercise strengthens the muscles in front of your thigh and knee (quadriceps). 1. Lean your back against a smooth wall or door, and walk your feet out 18-24 inches (46-61 cm) from it. 2. Place your feet hip-width apart. 3.  Slowly slide down the wall or door until your knees bend __________ degrees. Keep your knees over your heels, not over your toes. Keep your knees in line with your hips. 4. Hold this position for __________ seconds. Repeat __________ times. Complete this exercise __________ times a day. Straight leg raises This exercise strengthens the muscles that rotate the leg at the hip and move it away from your body (hip abductors). 1. Lie on your side with your left / right leg in the top position. Lie so your head, shoulder, knee, and hip line up. You may bend your bottom knee to help you keep your balance. 2. Roll your hips slightly forward so your hips are stacked directly over each other and your left / right knee is facing forward. 3. Leading with your heel, lift your top leg 4-6 inches (10-15 cm). You should feel the muscles in your outer hip lifting. ? Do not let your foot drift forward. ? Do not let your knee  roll toward the ceiling. 4. Hold this position for __________ seconds. 5. Slowly return your leg to the starting position. 6. Let your muscles relax completely after each repetition. Repeat __________ times. Complete this exercise __________ times a day. Straight leg raises This exercise stretches the muscles that move your hips away from the front of the pelvis (hip extensors). 1. Lie on your abdomen on a firm surface. You can put a pillow under your hips if that is more comfortable. 2. Tense the muscles in your buttocks and lift your left / right leg about 4-6 inches (10-15 cm). Keep your knee straight as you lift your leg. 3. Hold this position for __________ seconds. 4. Slowly lower your leg to the starting position. 5. Let your leg relax completely after each repetition. Repeat __________ times. Complete this exercise __________ times a day. This information is not intended to replace advice given to you by your health care provider. Make sure you discuss any questions you have with your health care provider. Document Revised: 12/31/2017 Document Reviewed: 12/31/2017 Elsevier Patient Education  2020 [employer].

## 2019-06-29 ENCOUNTER — Other Ambulatory Visit (HOSPITAL_COMMUNITY)
Admission: RE | Admit: 2019-06-29 | Discharge: 2019-06-29 | Disposition: A | Discharge status: Home / Self Care | Payer: PRIVATE HEALTH INSURANCE | Source: Ambulatory Visit | Attending: Certified Nurse Midwife | Admitting: Certified Nurse Midwife

## 2019-06-29 ENCOUNTER — Ambulatory Visit (INDEPENDENT_AMBULATORY_CARE_PROVIDER_SITE_OTHER): Payer: PRIVATE HEALTH INSURANCE | Admitting: Certified Nurse Midwife

## 2019-06-29 ENCOUNTER — Other Ambulatory Visit: Payer: Self-pay

## 2019-06-29 ENCOUNTER — Encounter: Payer: Self-pay | Admitting: Certified Nurse Midwife

## 2019-06-29 VITALS — BP 131/80 | HR 76 | Ht 68.0 in | Wt 277.1 lb

## 2019-06-29 DIAGNOSIS — N898 Other specified noninflammatory disorders of vagina: Secondary | ICD-10-CM | POA: Insufficient documentation

## 2019-06-29 DIAGNOSIS — N951 Menopausal and female climacteric states: Secondary | ICD-10-CM

## 2019-06-29 DIAGNOSIS — Z01419 Encounter for gynecological examination (general) (routine) without abnormal findings: Secondary | ICD-10-CM | POA: Diagnosis present

## 2019-06-29 DIAGNOSIS — Z124 Encounter for screening for malignant neoplasm of cervix: Secondary | ICD-10-CM | POA: Insufficient documentation

## 2019-06-29 DIAGNOSIS — Z8639 Personal history of other endocrine, nutritional and metabolic disease: Secondary | ICD-10-CM | POA: Diagnosis not present

## 2019-06-29 DIAGNOSIS — Z1231 Encounter for screening mammogram for malignant neoplasm of breast: Secondary | ICD-10-CM

## 2019-06-29 NOTE — Progress Notes (Addendum)
GYNECOLOGY ANNUAL PREVENTATIVE CARE ENCOUNTER NOTE  History:     Caitlin Barrett is a 49 y.o. G0P0000 female here for a routine annual gynecologic exam.  Current complaints: none  Denies abnormal vaginal bleeding, discharge, pelvic pain, problems with intercourse or other gynecologic concerns.     Social Works: Development worker, community Exercise: walks dogs for living Denies smoking, drinking, drugs.   Gynecologic History Patient's last menstrual period was 05/26/2016 (exact date). Contraception: status post hysterectomy Last Pap:2015. Results were: normal  Last mammogram: .01/2018 Results were: normal  Obstetric History OB History  Gravida Para Term Preterm AB Living  0 0 0 0 0 0  SAB TAB Ectopic Multiple Live Births  0 0 0 0 0    Past Medical History:  Diagnosis Date  . Allergy    seasonal.  Takes allergy shots at [facility]  . Anemia   . Anxiety   . Arthritis   . Asthma    exercise induced--no inhalers  . Cancer ([place]) 2010   Basal cell skin cancer Lt eye, skin graft above Lt eye  . Depression   . Dysmenorrhea   . Headache   . Hypothyroid    Nodules and Fine needle aspiration  . IBS (irritable bowel syndrome)   . Painful menstrual periods   . Plantar fasciitis   . Sprain of carpal (joint) of wrist     Past Surgical History:  Procedure Laterality Date  . ABDOMINAL HYSTERECTOMY Bilateral 07/02/2016   Procedure: HYSTERECTOMY ABDOMINAL WITH BILATERAL SALPINGECTOMY-MIDLINE INCISION;  Surgeon: Brayton Mars, MD;  Location: [facility] OR[facility];  Service: Gynecology;  Laterality: Bilateral;  . CHOLECYSTECTOMY  2009  . HERNIA REPAIR  [id number]   umbilical  . SKIN GRAFT  2010  . TONSILLECTOMY      Current Outpatient Medications on File Prior to Visit  Medication Sig Dispense Refill  . albuterol (VENTOLIN HFA) 108 (90 Base) MCG/ACT inhaler TAKE 2 PUFFS BY MOUTH EVERY 6 HOURS AS NEEDED FOR WHEEZE OR SHORTNESS OF BREATH 18 g 2  . baclofen (LIORESAL) 10 MG tablet TAKE 1/2 TO 1 TABLET BY  MOUTH AT BEDTIME AS NEEDED FOR MUSCLE SPASM 30 tablet 2  . clobetasol cream (TEMOVATE) 7.84 % Apply 1 application topically 2 (two) times daily. For up to 1-2 weeks then as needed. Do not use on sensitive skin. 30 g 1  . COLLAGEN-BORON-HYALURONIC ACID PO Take by mouth.    . diphenhydrAMINE (BENADRYL ALLERGY) 25 mg capsule Take 25 mg by mouth every 6 (six) hours as needed.    Marland Kitchen EPINEPHrine 0.3 mg/0.3 mL IJ SOAJ injection 0.3 mg as needed.     Marland Kitchen estradiol (ESTRACE) 1 MG tablet Take 1 tablet (1 mg total) by mouth daily. 90 tablet 0  . Loratadine 10 MG CAPS Take by mouth.    . Melatonin 3 MG TABS Take by mouth at bedtime.     . meloxicam (MOBIC) 15 MG tablet Take 1 tablet (15 mg total) by mouth daily. (Patient taking differently: Take 15 mg by mouth daily as needed. ) 30 tablet 0  . Misc Natural Products (OSTEO BI-FLEX ADV TRIPLE ST PO) Take by mouth.    . Multiple Vitamins-Minerals (MULTIVITAMIN WOMEN PO) Take by mouth.    . ondansetron (ZOFRAN) 4 MG tablet Take by mouth as needed.     Marland Kitchen oxymetazoline (AFRIN) 0.05 % nasal spray Place 1 spray into both nostrils as needed.     . Probiotic Product (TRUBIOTICS) CAPS Take 1 capsule by mouth daily.    Marland Kitchen  progesterone (PROMETRIUM) 200 MG capsule Take 1 capsule (200 mg total) by mouth daily. 30 capsule 6  . triamcinolone cream (KENALOG) 0.1 % APPLY THIN FILM TWICE DAILY TO ECZEMA UNTIL CLEAR    . TURMERIC PO Take by mouth daily.     No current facility-administered medications on file prior to visit.    Allergies  Allergen Reactions  . Contrast Media [Iodinated Diagnostic Agents]   . Codeine Nausea Only  . Other Hives    Uncoded Allergy. Allergen: contrast dye Uncoded Allergy. Allergen: contrast dye    Social History:  reports that she has never smoked. She has never used smokeless tobacco. She reports previous alcohol use. She reports that she does not use drugs.  Family History  Problem Relation Age of Onset  . Hyperlipidemia Mother   .  Hypertension Mother   . Cancer Father        lung CA  . Heart disease Maternal Grandmother   . Stroke Maternal Grandmother   . Hypertension Maternal Grandmother   . Diabetes Maternal Grandmother   . Heart disease Maternal Grandfather   . Diabetes Maternal Grandfather   . Heart attack Maternal Grandfather     The following portions of the patient's history were reviewed and updated as appropriate: allergies, current medications, past family history, past medical history, past social history, past surgical history and problem list.  Review of Systems Pertinent items noted in HPI and remainder of comprehensive ROS otherwise negative.  Physical Exam:  BP 131/80   Pulse 76   Ht 5' 8"  (1.727 m)   Wt 277 lb 1 oz (125.7 kg)   LMP 05/26/2016 (Exact Date)   BMI 42.13 kg/m  CONSTITUTIONAL: Well-developed, well-nourished female in no acute distress.  HENT:  Normocephalic, atraumatic, External right and left ear normal. Oropharynx is clear and moist EYES: Conjunctivae and EOM are normal. Pupils are equal, round, and reactive to light. No scleral icterus.  NECK: Normal range of motion, supple, no masses.  Normal thyroid.  SKIN: Skin is warm and dry. No rash noted. Not diaphoretic. No erythema. No pallor. MUSCULOSKELETAL: Normal range of motion. No tenderness.  No cyanosis, clubbing, or edema.  2+ distal pulses. NEUROLOGIC: Alert and oriented to person, place, and time. Normal reflexes, muscle tone coordination.  PSYCHIATRIC: Normal mood and affect. Normal behavior. Normal judgment and thought content. CARDIOVASCULAR: Normal heart rate noted, regular rhythm RESPIRATORY: Clear to auscultation bilaterally. Effort and breath sounds normal, no problems with respiration noted. BREASTS: Symmetric in size. No masses, tenderness, skin changes, nipple drainage, or lymphadenopathy bilaterally. Performed in the presence of a chaperone. ABDOMEN: Soft, no distention noted.  No tenderness, rebound or  guarding.  PELVIC: Normal appearing external genitalia and urethral meatus; normal appearing vaginal mucosa and cervix.  No abnormal discharge noted.  Pap smear obtained.  Uterus absent no other palpable masses, no adnexal tenderness.   Assessment and Plan:    1. Women's annual routine gynecological examination  Will follow up results of pap smear and manage accordingly. Mammogram scheduled Labs: Vitamin D, Vitamin B12, FSH, estradol Routine preventative health maintenance measures emphasized.Encouraged weight loss and exercise.  Please refer to After Visit Summary for other counseling recommendations.      Philip Aspen, CNM

## 2019-06-29 NOTE — Patient Instructions (Signed)

## 2019-06-30 LAB — FOLLICLE STIMULATING HORMONE: FSH: 12.9 m[IU]/mL

## 2019-06-30 LAB — VITAMIN D 25 HYDROXY (VIT D DEFICIENCY, FRACTURES): Vit D, 25-Hydroxy: 24.3 ng/mL — ABNORMAL LOW (ref 30.0–100.0)

## 2019-06-30 LAB — ESTRADIOL: Estradiol: 178 pg/mL

## 2019-06-30 LAB — VITAMIN B12: Vitamin B-12: 582 pg/mL (ref 232–1245)

## 2019-07-01 ENCOUNTER — Other Ambulatory Visit: Payer: Self-pay | Admitting: Certified Nurse Midwife

## 2019-07-01 LAB — CERVICOVAGINAL ANCILLARY ONLY
Bacterial Vaginitis (gardnerella): NEGATIVE
Candida Glabrata: NEGATIVE
Candida Vaginitis: POSITIVE — AB
Comment: NEGATIVE
Comment: NEGATIVE
Comment: NEGATIVE

## 2019-07-01 MED ORDER — FLUCONAZOLE 150 MG PO TABS: 150.0000 mg | ORAL_TABLET | Freq: Once | ORAL | 0 refills | Status: AC

## 2019-07-02 ENCOUNTER — Other Ambulatory Visit: Payer: Self-pay

## 2019-07-02 MED ORDER — ESTRADIOL 1 MG PO TABS: 1.0000 mg | ORAL_TABLET | Freq: Every day | ORAL | 0 refills | Status: DC

## 2019-07-02 MED ORDER — PROGESTERONE MICRONIZED 200 MG PO CAPS: 200.0000 mg | ORAL_CAPSULE | Freq: Every day | ORAL | 6 refills | Status: DC

## 2019-07-02 NOTE — Telephone Encounter (Signed)
Refills sent per patient request. Mychart message sent to patient

## 2019-07-06 DIAGNOSIS — Z6841 Body Mass Index (BMI) 40.0 and over, adult: Secondary | ICD-10-CM

## 2019-07-06 LAB — CYTOLOGY - PAP
Comment: NEGATIVE
Comment: NEGATIVE
Comment: NEGATIVE
HPV 16: NEGATIVE
HPV 18 / 45: NEGATIVE
High risk HPV: POSITIVE — AB

## 2019-07-10 ENCOUNTER — Ambulatory Visit: Payer: PRIVATE HEALTH INSURANCE | Admitting: Rheumatology

## 2019-08-05 ENCOUNTER — Encounter: Payer: Self-pay | Admitting: Obstetrics and Gynecology

## 2019-08-05 ENCOUNTER — Ambulatory Visit: Payer: PRIVATE HEALTH INSURANCE | Admitting: Rheumatology

## 2019-08-05 ENCOUNTER — Ambulatory Visit (INDEPENDENT_AMBULATORY_CARE_PROVIDER_SITE_OTHER): Payer: PRIVATE HEALTH INSURANCE | Admitting: Obstetrics and Gynecology

## 2019-08-05 ENCOUNTER — Other Ambulatory Visit: Payer: Self-pay

## 2019-08-05 VITALS — BP 144/83 | HR 74 | Ht 68.0 in | Wt 280.2 lb

## 2019-08-05 DIAGNOSIS — R87622 Low grade squamous intraepithelial lesion on cytologic smear of vagina (LGSIL): Secondary | ICD-10-CM

## 2019-08-05 NOTE — Progress Notes (Signed)
    GYNECOLOGY CLINIC COLPOSCOPY PROCEDURE NOTE  49 y.o. G0P0000 here for colposcopy for low-grade squamous intraepithelial neoplasia (LGSIL - encompassing HPV,mild dysplasia,CIN I) vaginal pap smear on 06/29/2019. She is post-hysterectomy. Discussed role for HPV in cervical dysplasia, need for surveillance.  Patient given informed consent, signed copy in the chart, time out was performed.  Placed in lithotomy position. Vagina viewed with speculum and colposcope after application of acetic acid.   Colposcopy adequate? Yes  No visible lesions; no corresponding biopsies obtained.  Patient was given post procedure instructions.  Patient advised on follow up.  will be contacted with results and recommendations.  Routine preventative health maintenance measures emphasized.     Rubie Maid, MD [facility]

## 2019-08-05 NOTE — Progress Notes (Signed)
Pt present to have a colposcopy due to positive pap smear. Pt stated that this is the first time she has had a colposcopy.

## 2019-08-10 ENCOUNTER — Telehealth: Payer: Self-pay | Admitting: Obstetrics and Gynecology

## 2019-08-10 ENCOUNTER — Other Ambulatory Visit: Payer: Self-pay | Admitting: Certified Nurse Midwife

## 2019-08-10 ENCOUNTER — Other Ambulatory Visit: Payer: Self-pay

## 2019-08-10 NOTE — Telephone Encounter (Signed)
Patient is called to make sure Dr. Marcelline Mates got her MyChart message. Informed patient that Dr. Marcelline Mates was out of the office, and that she has 24-48 hours to get back in touch with patient. Patient verbalized understanding.

## 2019-08-11 ENCOUNTER — Other Ambulatory Visit: Payer: Self-pay

## 2019-08-11 MED ORDER — ESTRADIOL 1 MG PO TABS: 1.0000 mg | ORAL_TABLET | Freq: Every day | ORAL | 4 refills | Status: DC

## 2019-08-12 ENCOUNTER — Encounter (INDEPENDENT_AMBULATORY_CARE_PROVIDER_SITE_OTHER): Payer: Self-pay

## 2019-08-13 ENCOUNTER — Other Ambulatory Visit: Payer: Self-pay

## 2019-08-13 ENCOUNTER — Encounter (INDEPENDENT_AMBULATORY_CARE_PROVIDER_SITE_OTHER): Payer: Self-pay | Admitting: Family Medicine

## 2019-08-13 ENCOUNTER — Ambulatory Visit (INDEPENDENT_AMBULATORY_CARE_PROVIDER_SITE_OTHER): Payer: Self-pay | Admitting: Family Medicine

## 2019-08-13 ENCOUNTER — Ambulatory Visit (INDEPENDENT_AMBULATORY_CARE_PROVIDER_SITE_OTHER): Payer: PRIVATE HEALTH INSURANCE | Admitting: Family Medicine

## 2019-08-13 VITALS — BP 138/87 | HR 68 | Temp 97.8°F | Ht 69.0 in | Wt 277.0 lb

## 2019-08-13 DIAGNOSIS — M2550 Pain in unspecified joint: Secondary | ICD-10-CM | POA: Diagnosis not present

## 2019-08-13 DIAGNOSIS — R0602 Shortness of breath: Secondary | ICD-10-CM | POA: Diagnosis not present

## 2019-08-13 DIAGNOSIS — Z780 Asymptomatic menopausal state: Secondary | ICD-10-CM

## 2019-08-13 DIAGNOSIS — Z6841 Body Mass Index (BMI) 40.0 and over, adult: Secondary | ICD-10-CM

## 2019-08-13 DIAGNOSIS — E079 Disorder of thyroid, unspecified: Secondary | ICD-10-CM

## 2019-08-13 DIAGNOSIS — E7849 Other hyperlipidemia: Secondary | ICD-10-CM

## 2019-08-13 DIAGNOSIS — Z0289 Encounter for other administrative examinations: Secondary | ICD-10-CM

## 2019-08-13 DIAGNOSIS — Z1331 Encounter for screening for depression: Secondary | ICD-10-CM

## 2019-08-13 DIAGNOSIS — Z9189 Other specified personal risk factors, not elsewhere classified: Secondary | ICD-10-CM | POA: Diagnosis not present

## 2019-08-13 DIAGNOSIS — R5383 Other fatigue: Secondary | ICD-10-CM

## 2019-08-13 DIAGNOSIS — E559 Vitamin D deficiency, unspecified: Secondary | ICD-10-CM

## 2019-08-13 NOTE — Progress Notes (Signed)
Chief Complaint:   OBESITY [name] (MR# [id number]) is a 49 y.o. female who presents for evaluation and treatment of obesity and related comorbidities. Current BMI is Body mass index is 40.91 kg/m. Caitlin Barrett has been struggling with her weight for many years and has been unsuccessful in either losing weight, maintaining weight loss, or reaching her healthy weight goal.  Caitlin Barrett is currently in the action stage of change and ready to dedicate time achieving and maintaining a healthier weight. Caitlin Barrett is interested in becoming our patient and working on intensive lifestyle modifications including (but not limited to) diet and exercise for weight loss.  Caitlin Barrett provided the following diet recall today: Breakfast:  She sometimes has a sandwich at 10:00 am. Lunch:  Fast food or sandwich/soup with crackers/chip between 2-3 pm. Dinner:  Eats out for dinner, meat and 2 vegetables. Drinking soda again and 1/2 sweet and 1/2 unsweet tea.  Caitlin Barrett's habits were reviewed today and are as follows: Her family eats meals together, she thinks her family will eat healthier with her, she struggles with family and or coworkers weight loss sabotage, her desired weight loss is 80 pounds, she has been heavy most of her life, she started gaining weight when starting school, due to health issues, and due to depression, her heaviest weight ever was 280 pounds, she is a picky eater,  she craves fast food and sweets, she snacks frequently in the evenings, she skips lunch sometimes, she is frequently drinking liquids with calories, she frequently makes poor food choices, she frequently eats larger portions than normal and she struggles with emotional eating.  Depression Screen Caitlin Barrett's Food and Mood (modified PHQ-9) score was 20.  Depression screen PHQ 2/9 08/13/2019  Decreased Interest 3  Down, Depressed, Hopeless 3  PHQ - 2 Score 6  Altered sleeping 3  Tired, decreased energy 2  Change in  appetite 3  Feeling bad or failure about yourself  2  Trouble concentrating 1  Moving slowly or fidgety/restless 2  Suicidal thoughts 1  PHQ-9 Score 20  Difficult doing work/chores Somewhat difficult   Subjective:   1. Other fatigue Caitlin Barrett denies daytime somnolence and admits to waking up still tired. Patent has a history of symptoms of morning fatigue, morning headache and snoring. Caitlin Barrett generally gets 3-5 hours of sleep per night, and states that she has poor quality sleep. Snoring is present. Apneic episodes are present. Epworth Sleepiness Score is 3.  2. SOB (shortness of breath) on exertion Caitlin Barrett notes increasing shortness of breath with exercising and seems to be worsening over time with weight gain. She notes getting out of breath sooner with activity than she used to. This has gotten worse recently. Caitlin Barrett denies shortness of breath at rest or orthopnea.  3. Thyroid disease Caitlin Barrett has history of hyperthyroidism and nodules, status post biopsy.  She is followed by [facility].  4. Polyarthralgia, with myofascial pain Caitlin Barrett is followed by Dr. Estanislado Pandy.  She has a fraternal twin sister with psoriasis.  She hasd COVID in November 2020.  She says that after that, her pain worsened.  5. Vitamin D deficiency Caitlin Barrett's Vitamin D level was 24.3 on 06/29/2019. She is currently taking a multivitamin. She denies nausea, vomiting or muscle weakness.  6. Other hyperlipidemia Caitlin Barrett has hyperlipidemia and has been trying to improve her cholesterol levels with intensive lifestyle modification including a low saturated fat diet, exercise and weight loss. She denies any chest pain, claudication or myalgias.  Lab Results  Component  Value Date   ALT 10 04/28/2019   AST 11 04/28/2019   ALKPHOS 47 10/01/2016   BILITOT 0.4 04/28/2019   Lab Results  Component Value Date   CHOL 193 10/23/2017   HDL 40 (L) 10/23/2017   LDLCALC 128 (H) 10/23/2017   TRIG 133 10/23/2017   CHOLHDL 4.8  10/23/2017   7. Postmenopausal Caitlin Barrett is on hormone replacement therapy.  She is followed by GYN.  8. Depression screening Caitlin Barrett was screened for depression as part of her new patient workup.  PHQ-9 is 20.  9. At risk for constipation Caitlin Barrett is at increased risk for constipation due to inadequate water intake, changes in diet, and/or use of medications such as GLP1 agonists. Caitlin Barrett denies hard, infrequent stools currently.   Assessment/Plan:   1. Other fatigue Caitlin Barrett does feel that her weight is causing her energy to be lower than it should be. Fatigue may be related to obesity, depression or many other causes. Labs will be ordered, and in the meanwhile, Caitlin Barrett will focus on self care including making healthy food choices, increasing physical activity and focusing on stress reduction.  Orders - EKG 12-Lead - Comprehensive metabolic panel - CBC with Differential/Platelet - Hemoglobin A1c - Insulin, random - Anemia panel  2. SOB (shortness of breath) on exertion Caitlin Barrett does not feel that she gets out of breath more easily that she used to when she exercises. Caitlin Barrett's shortness of breath appears to be obesity related and exercise induced. She has agreed to work on weight loss and gradually increase exercise to treat her exercise induced shortness of breath. Will continue to monitor closely.  3. Thyroid disease Will check labs today, as per below.  Orders - TSH - T4, free  4. Polyarthralgia, with myofascial pain Followed by Rheumatology for this problem. Those encounter notes were reviewed.  5. Vitamin D deficiency Low Vitamin D level contributes to fatigue and are associated with obesity, breast, and colon cancer.  6. Other hyperlipidemia Cardiovascular risk and specific lipid/LDL goals reviewed.  We discussed several lifestyle modifications today and Caitlin Barrett will continue to work on diet, exercise and weight loss efforts. Orders and follow up as documented in  patient record.   Counseling Intensive lifestyle modifications are the first line treatment for this issue. . Dietary changes: Increase soluble fiber. Decrease simple carbohydrates. . Exercise changes: Moderate to vigorous-intensity aerobic activity 150 minutes per week if tolerated. . Lipid-lowering medications: see documented in medical record.  Orders - Lipid panel  7. Postmenopausal Will continue to monitor.  8. Depression screening Caitlin Barrett had a positive depression screening. Depression is commonly associated with obesity and often results in emotional eating behaviors. We will monitor this closely and work on CBT to help improve the non-hunger eating patterns. Referral to Psychology may be required if no improvement is seen as she continues in our clinic.  9. At risk for constipation Caitlin Barrett was given approximately 15 minutes of counseling today regarding prevention of constipation. She was encouraged to increase water and fiber intake.   10. Class 3 severe obesity with serious comorbidity and body mass index (BMI) of 40.0 to 44.9 in adult, unspecified obesity type (HCC) Caitlin Barrett is currently in the action stage of change and her goal is to continue with weight loss efforts. I recommend Caitlin Barrett begin the structured treatment plan as follows:  She has agreed to the Category 3 Plan +100 calories.  Exercise goals: Walks dogs for work.   Behavioral modification strategies: increasing lean protein intake, decreasing simple carbohydrates,  increasing vegetables, increasing water intake and decreasing liquid calories.  She was informed of the importance of frequent follow-up visits to maximize her success with intensive lifestyle modifications for her multiple health conditions. She was informed we would discuss her lab results at her next visit unless there is a critical issue that needs to be addressed sooner. Caitlin Barrett agreed to keep her next visit at the agreed upon time to discuss  these results.  Objective:   Blood pressure 138/87, pulse 68, temperature 97.8 F (36.6 C), temperature source Oral, height 5' 9"  (1.753 m), weight 277 lb (125.6 kg), last menstrual period 05/26/2016, SpO2 98 %. Body mass index is 40.91 kg/m.  EKG: Normal sinus rhythm, rate 65 bpm.  Indirect Calorimeter completed today shows a VO2 of 319 and a REE of 2222.  Her calculated basal metabolic rate is 7062 thus her basal metabolic rate is better than expected.  General: Cooperative, alert, well developed, in no acute distress. HEENT: Conjunctivae and lids unremarkable. Cardiovascular: Regular rhythm.  Lungs: Normal work of breathing. Neurologic: No focal deficits.   Lab Results  Component Value Date   CREATININE 0.81 04/28/2019   BUN 13 04/28/2019   NA 141 04/28/2019   K 4.3 04/28/2019   CL 105 04/28/2019   CO2 27 04/28/2019   Lab Results  Component Value Date   ALT 10 04/28/2019   AST 11 04/28/2019   ALKPHOS 47 10/01/2016   BILITOT 0.4 04/28/2019   Lab Results  Component Value Date   HGBA1C 5.3 10/23/2017   HGBA1C 5.0 10/01/2016   Lab Results  Component Value Date   TSH 0.13 (L) 05/22/2019   Lab Results  Component Value Date   CHOL 193 10/23/2017   HDL 40 (L) 10/23/2017   LDLCALC 128 (H) 10/23/2017   TRIG 133 10/23/2017   CHOLHDL 4.8 10/23/2017   Lab Results  Component Value Date   WBC 8.4 04/28/2019   HGB 12.7 04/28/2019   HCT 36.8 04/28/2019   MCV 87.2 04/28/2019   PLT 372 04/28/2019   Lab Results  Component Value Date   FERRITIN 32 04/18/2016   Attestation Statements:   This is the patient's first visit at [facility]. The patient's NEW PATIENT PACKET was reviewed at length. Included in the packet: current and past health history, medications, allergies, ROS, gynecologic history (women only), surgical history, family history, social history, weight history, weight loss surgery history (for those that have had weight loss surgery),  nutritional evaluation, mood and food questionnaire, PHQ9, Epworth questionnaire, sleep habits questionnaire, patient life and health improvement goals questionnaire. These will all be scanned into the patient's chart under media.   During the visit, I independently reviewed the patient's EKG, bioimpedance scale results, and indirect calorimeter results. I used this information to tailor a meal plan for the patient that will help her to lose weight and will improve her obesity-related conditions going forward. I performed a medically necessary appropriate examination and/or evaluation. I discussed the assessment and treatment plan with the patient. The patient was provided an opportunity to ask questions and all were answered. The patient agreed with the plan and demonstrated an understanding of the instructions. Labs were ordered at this visit and will be reviewed at the next visit unless more critical results need to be addressed immediately. Clinical information was updated and documented in the EMR.   I, Water quality scientist, CMA, am acting as Location manager for [employer], DO.  I have reviewed the above documentation for accuracy  and completeness, and I agree with the above. Briscoe Deutscher, DO

## 2019-08-14 LAB — TSH: TSH: 0.081 u[IU]/mL — ABNORMAL LOW (ref 0.450–4.500)

## 2019-08-14 LAB — CBC WITH DIFFERENTIAL/PLATELET
Basophils Absolute: 0.1 10*3/uL (ref 0.0–0.2)
Basos: 1 %
EOS (ABSOLUTE): 1.4 10*3/uL — ABNORMAL HIGH (ref 0.0–0.4)
Eos: 15 %
Hemoglobin: 13.3 g/dL (ref 11.1–15.9)
Immature Grans (Abs): 0 10*3/uL (ref 0.0–0.1)
Immature Granulocytes: 0 %
Lymphocytes Absolute: 2.1 10*3/uL (ref 0.7–3.1)
Lymphs: 21 %
MCH: 30.9 pg (ref 26.6–33.0)
MCHC: 34.2 g/dL (ref 31.5–35.7)
MCV: 91 fL (ref 79–97)
Monocytes Absolute: 0.7 10*3/uL (ref 0.1–0.9)
Monocytes: 7 %
Neutrophils Absolute: 5.6 10*3/uL (ref 1.4–7.0)
Neutrophils: 56 %
Platelets: 373 10*3/uL (ref 150–450)
RBC: 4.3 x10E6/uL (ref 3.77–5.28)
RDW: 11.8 % (ref 11.7–15.4)
WBC: 9.9 10*3/uL (ref 3.4–10.8)

## 2019-08-14 LAB — ANEMIA PANEL
Ferritin: 30 ng/mL (ref 15–150)
Folate, Hemolysate: 392 ng/mL
Folate, RBC: 1008 ng/mL (ref >498)
Hematocrit: 38.9 % (ref 34.0–46.6)
Iron Saturation: 19 % (ref 15–55)
Iron: 64 ug/dL (ref 27–159)
Retic Ct Pct: 1.2 % (ref 0.6–2.6)
Total Iron Binding Capacity: 333 ug/dL (ref 250–450)
UIBC: 269 ug/dL (ref 131–425)
Vitamin B-12: 442 pg/mL (ref 232–1245)

## 2019-08-14 LAB — LIPID PANEL
Chol/HDL Ratio: 4.9 ratio — ABNORMAL HIGH (ref 0.0–4.4)
Cholesterol, Total: 207 mg/dL — ABNORMAL HIGH (ref 100–199)
HDL: 42 mg/dL (ref >39)
LDL Chol Calc (NIH): 136 mg/dL — ABNORMAL HIGH (ref 0–99)
Triglycerides: 159 mg/dL — ABNORMAL HIGH (ref 0–149)
VLDL Cholesterol Cal: 29 mg/dL (ref 5–40)

## 2019-08-14 LAB — COMPREHENSIVE METABOLIC PANEL
ALT: 10 IU/L (ref 0–32)
AST: 12 IU/L (ref 0–40)
Albumin/Globulin Ratio: 1.6 (ref 1.2–2.2)
Albumin: 4.2 g/dL (ref 3.8–4.8)
Alkaline Phosphatase: 55 IU/L (ref 48–121)
BUN/Creatinine Ratio: 17 (ref 9–23)
BUN: 14 mg/dL (ref 6–24)
Bilirubin Total: 0.3 mg/dL (ref 0.0–1.2)
CO2: 24 mmol/L (ref 20–29)
Calcium: 9.5 mg/dL (ref 8.7–10.2)
Chloride: 102 mmol/L (ref 96–106)
Creatinine, Ser: 0.81 mg/dL (ref 0.57–1.00)
GFR calc Af Amer: 101 mL/min/{1.73_m2} (ref >59)
GFR calc non Af Amer: 87 mL/min/{1.73_m2} (ref >59)
Globulin, Total: 2.7 g/dL (ref 1.5–4.5)
Glucose: 91 mg/dL (ref 65–99)
Potassium: 4.6 mmol/L (ref 3.5–5.2)
Sodium: 139 mmol/L (ref 134–144)
Total Protein: 6.9 g/dL (ref 6.0–8.5)

## 2019-08-14 LAB — HEMOGLOBIN A1C
Est. average glucose Bld gHb Est-mCnc: 105 mg/dL
Hgb A1c MFr Bld: 5.3 % (ref 4.8–5.6)

## 2019-08-14 LAB — T4, FREE: Free T4: 1.03 ng/dL (ref 0.82–1.77)

## 2019-08-14 LAB — INSULIN, RANDOM: INSULIN: 19 u[IU]/mL (ref 2.6–24.9)

## 2019-08-27 ENCOUNTER — Ambulatory Visit (INDEPENDENT_AMBULATORY_CARE_PROVIDER_SITE_OTHER): Payer: Self-pay | Admitting: Family Medicine

## 2019-08-27 ENCOUNTER — Encounter (INDEPENDENT_AMBULATORY_CARE_PROVIDER_SITE_OTHER): Payer: Self-pay | Admitting: Family Medicine

## 2019-08-27 ENCOUNTER — Ambulatory Visit (INDEPENDENT_AMBULATORY_CARE_PROVIDER_SITE_OTHER): Payer: PRIVATE HEALTH INSURANCE | Admitting: Family Medicine

## 2019-08-27 ENCOUNTER — Other Ambulatory Visit: Payer: Self-pay

## 2019-08-27 VITALS — BP 136/81 | HR 81 | Temp 98.2°F | Ht 69.0 in | Wt 270.0 lb

## 2019-08-27 DIAGNOSIS — E0520 Thyrotoxicosis with toxic multinodular goiter without thyrotoxic crisis or storm: Secondary | ICD-10-CM | POA: Diagnosis not present

## 2019-08-27 DIAGNOSIS — E8881 Metabolic syndrome: Secondary | ICD-10-CM

## 2019-08-27 DIAGNOSIS — E782 Mixed hyperlipidemia: Secondary | ICD-10-CM

## 2019-08-27 DIAGNOSIS — Z9189 Other specified personal risk factors, not elsewhere classified: Secondary | ICD-10-CM | POA: Diagnosis not present

## 2019-08-27 DIAGNOSIS — Z6839 Body mass index (BMI) 39.0-39.9, adult: Secondary | ICD-10-CM

## 2019-08-31 NOTE — Progress Notes (Signed)
Chief Complaint:   OBESITY Caitlin Barrett is here to discuss her progress with her obesity treatment plan along with follow-up of her obesity related diagnoses. Caitlin Barrett is on the Category 3 Plan +100 calories and states she is following her eating plan approximately 90% of the time. Caitlin Barrett states she is exercising for 0 minutes 0 times per week.  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  Consuming dairy causes diarrhea and nausea if she has it early in the day.  She says Premier Protein was better than dairy.  She has had some hard stool.  Subjective:   1. Insulin resistance Caitlin Barrett has a diagnosis of insulin resistance based on her elevated fasting insulin level >5. She continues to work on diet and exercise to decrease her risk of diabetes.  Lab Results  Component Value Date   INSULIN 19.0 08/13/2019   Lab Results  Component Value Date   HGBA1C 5.3 08/13/2019   2. Mixed hyperlipidemia Caitlin Barrett has hyperlipidemia and has been trying to improve her cholesterol levels with intensive lifestyle modification including a low saturated fat diet, exercise and weight loss. She denies any chest pain, claudication or myalgias.  Lab Results  Component Value Date   ALT 10 08/13/2019   AST 12 08/13/2019   ALKPHOS 55 08/13/2019   BILITOT 0.3 08/13/2019   Lab Results  Component Value Date   CHOL 207 (H) 08/13/2019   HDL 42 08/13/2019   LDLCALC 136 (H) 08/13/2019   TRIG 159 (H) 08/13/2019   CHOLHDL 4.9 (H) 08/13/2019   3. Toxic multinodul goiter Caitlin Barrett is followed [facility] at [facility].  She is having no symptoms.  Lab Results  Component Value Date   TSH 0.081 (L) 08/13/2019   Assessment/Plan:   1. Insulin resistance Caitlin Barrett will continue to work on  weight loss, exercise, and decreasing simple carbohydrates to help decrease the risk of diabetes. Caitlin Barrett agreed to follow-up with [place] as directed to closely monitor her progress.  2. Mixed hyperlipidemia Cardiovascular risk and specific lipid/LDL goals reviewed.  We discussed several lifestyle modifications today and Caitlin Barrett will continue to work on diet, exercise and weight loss efforts. Orders and follow up as documented in patient record.   Counseling Intensive lifestyle modifications are the first line treatment for this issue. . Dietary changes: Increase soluble fiber. Decrease simple carbohydrates. . Exercise changes: Moderate to vigorous-intensity aerobic activity 150 minutes per week if tolerated. . Lipid-lowering medications: see documented in medical record.  3. Toxic multinodul goiter Will continue to follow along with Endocrinology.  4. At risk for diarrhea Caitlin Barrett was given approximately 15 minutes of diarrhea prevention counseling today. She is 49 y.o. female and has risk factors for diarrhea including medications and changes in diet. We discussed intensive lifestyle modifications today with an emphasis on specific weight loss instructions including dietary strategies.   Repetitive spaced learning was employed today to elicit superior memory formation and behavioral change.  5. Class 2 severe obesity with serious comorbidity and body mass index (BMI) of 39.0 to 39.9 in adult, unspecified obesity type (HCC) Caitlin Barrett is currently in the action stage of change. As such, her goal is to continue with weight loss efforts. She has agreed to the Category 3 Plan.   Exercise goals: As is.  Behavioral modification strategies: increasing lean protein intake, increasing water intake and increasing high fiber foods.  Caitlin Barrett has agreed to follow-up with our clinic in 2 weeks. She was informed of the importance of frequent follow-up visits to maximize her success with intensive lifestyle  modifications for her multiple health conditions.   Objective:   Blood pressure 136/81, pulse 81, temperature 98.2 F (36.8 C), temperature source Oral, height 5' 9"  (1.753 m), weight 270 lb (122.5 kg), last menstrual period 05/26/2016, SpO2 96 %. Body mass index is 39.87 kg/m.  General: Cooperative, alert, well developed, in no acute distress. HEENT: Conjunctivae and lids unremarkable. Cardiovascular: Regular rhythm.  Lungs: Normal work of breathing. Neurologic: No focal deficits.   Lab Results  Component Value Date   CREATININE 0.81 08/13/2019   BUN 14 08/13/2019   NA 139 08/13/2019   K 4.6 08/13/2019   CL 102 08/13/2019   CO2 24 08/13/2019   Lab Results  Component Value Date   ALT 10 08/13/2019   AST 12 08/13/2019   ALKPHOS 55 08/13/2019   BILITOT 0.3 08/13/2019   Lab Results  Component Value Date   HGBA1C 5.3 08/13/2019   HGBA1C 5.3 10/23/2017   HGBA1C 5.0 10/01/2016   Lab Results  Component Value Date   INSULIN 19.0 08/13/2019   Lab Results  Component Value Date   TSH 0.081 (L) 08/13/2019   Lab Results  Component Value Date   CHOL 207 (H) 08/13/2019   HDL 42 08/13/2019   LDLCALC 136 (H) 08/13/2019   TRIG 159 (H) 08/13/2019   CHOLHDL 4.9 (H) 08/13/2019   Lab Results  Component Value Date   WBC 9.9 08/13/2019   HGB 13.3 08/13/2019   HCT 38.9 08/13/2019   MCV 91 08/13/2019   PLT 373 08/13/2019   Lab Results  Component Value Date   IRON 64 08/13/2019   TIBC 333 08/13/2019   FERRITIN 30 08/13/2019   Attestation Statements:   Reviewed by clinician on day of visit: allergies, medications, problem list, medical history, surgical history, family history, social history, and previous encounter notes.  I, Water quality scientist, CMA, am acting as transcriptionist for Briscoe Deutscher, DO  I have reviewed the above documentation for accuracy and completeness, and I agree with the above. Briscoe Deutscher, DO

## 2019-09-17 ENCOUNTER — Encounter (INDEPENDENT_AMBULATORY_CARE_PROVIDER_SITE_OTHER): Payer: Self-pay | Admitting: Adult Health

## 2019-09-17 ENCOUNTER — Ambulatory Visit (INDEPENDENT_AMBULATORY_CARE_PROVIDER_SITE_OTHER): Payer: PRIVATE HEALTH INSURANCE | Admitting: Adult Health

## 2019-09-17 ENCOUNTER — Other Ambulatory Visit: Payer: Self-pay

## 2019-09-17 VITALS — BP 143/77 | HR 68 | Temp 97.9°F | Ht 69.0 in | Wt 263.0 lb

## 2019-09-17 DIAGNOSIS — Z9189 Other specified personal risk factors, not elsewhere classified: Secondary | ICD-10-CM | POA: Diagnosis not present

## 2019-09-17 DIAGNOSIS — Z6838 Body mass index (BMI) 38.0-38.9, adult: Secondary | ICD-10-CM

## 2019-09-17 DIAGNOSIS — E559 Vitamin D deficiency, unspecified: Secondary | ICD-10-CM

## 2019-09-17 DIAGNOSIS — E0520 Thyrotoxicosis with toxic multinodular goiter without thyrotoxic crisis or storm: Secondary | ICD-10-CM

## 2019-09-17 DIAGNOSIS — Z6841 Body Mass Index (BMI) 40.0 and over, adult: Secondary | ICD-10-CM | POA: Insufficient documentation

## 2019-09-17 DIAGNOSIS — E8881 Metabolic syndrome: Secondary | ICD-10-CM | POA: Diagnosis not present

## 2019-09-17 MED ORDER — VITAMIN D (ERGOCALCIFEROL) 1.25 MG (50000 UNIT) PO CAPS: 50000.0000 [IU] | ORAL_CAPSULE | ORAL | 0 refills | Status: DC

## 2019-09-17 NOTE — Progress Notes (Signed)
Chief Complaint:   OBESITY Caitlin Barrett is here to discuss her progress with her obesity treatment plan along with follow-up of her obesity related diagnoses. Caitlin Barrett is on the Category 3 Plan and states she is following her eating plan approximately 90% of the time. Caitlin Barrett states she has increased her dog walking.   Today's visit was #: 3 Starting weight: 277 lbs Starting date: 08/13/2019 Today's weight: 263 lbs Today's date: 09/17/2019 Total lbs lost to date: 14 Total lbs lost since last in-office visit: 7  Interim History: Caitlin Barrett recently traveled to [facility] for 4 days and stayed on plan by grocery shopping at [employer] and making meals in their hotel room. She continues to enjoy the Category 3 meal plan and denies excessive cravings or polyphagia.  Subjective:   Vitamin D deficiency. Caitlin Barrett is on an OTC multivitamin. She is not on prescription strength Vitamin D supplementation.    Ref. Range 06/29/2019 15:27  Vitamin D, 25-Hydroxy Latest Ref Range: 30.0 - 100.0 ng/mL 24.3 (L)   Insulin resistance. Caitlin Barrett has a diagnosis of insulin resistance based on her elevated fasting insulin level >5. She continues to work on diet and exercise to decrease her risk of diabetes. She denies polyphagia. She is not on metformin.  Lab Results  Component Value Date   INSULIN 19.0 08/13/2019   Lab Results  Component Value Date   HGBA1C 5.3 08/13/2019   Toxic multinodul goiter. On 08/13/2019 TSH was 0.081 with a free T4 of 1.03. Caitlin Barrett is not on thyroid medication. She is followed by [facility]. She denies excessive fatigue.  At risk for osteoporosis. Caitlin Barrett is at higher risk of osteopenia and osteoporosis due to Vitamin D deficiency and obesity.   Assessment/Plan:   Vitamin D deficiency. Low Vitamin D level contributes to fatigue and are associated with obesity, breast, and colon cancer. She will start prescription Vitamin D, Ergocalciferol, (DRISDOL) 1.25 MG  (50000 UNIT) CAPS capsule every week #4 with 0 refills and will follow-up for routine testing of Vitamin D every 3 months.   Insulin resistance. [place] will continue to work on weight loss, exercise, and decreasing simple carbohydrates to help decrease the risk of diabetes. Caitlin Barrett agreed to follow-up with [place] as directed to closely monitor her progress. She will continue to follow the Category 3 meal plan and will have labs checked every 3 months.  Toxic multinodul goiter. Caitlin Barrett will continue to follow with [facility], which is typically an annual follow-up.  At risk for osteoporosis. Caitlin Barrett was given approximately 15 minutes of osteoporosis prevention counseling today. Caitlin Barrett is at risk for osteopenia and osteoporosis due to her Vitamin D deficiency. She was encouraged to take her Vitamin D and follow her higher calcium diet and increase strengthening exercise to help strengthen her bones and decrease her risk of osteopenia and osteoporosis.  Repetitive spaced learning was employed today to elicit superior memory formation and behavioral change.  Class 2 severe obesity with serious comorbidity and body mass index (BMI) of 38.0 to 38.9 in adult, unspecified obesity type ([place]).  Caitlin Barrett is currently in the action stage of change. As such, her goal is to continue with weight loss efforts. She has agreed to the Category 3 Plan + 100 extra snack calories.   Exercise goals: Caitlin Barrett will continue her current exercise regimen.   Behavioral modification strategies: increasing lean protein intake, meal planning and cooking strategies, travel eating strategies and planning for success.  Caitlin Barrett has agreed to follow-up with  our clinic in 2 weeks. She was informed of the importance of frequent follow-up visits to maximize her success with intensive lifestyle modifications for her multiple health conditions.   Objective:   Blood pressure (!) 143/77, pulse 68, temperature 97.9 F (36.6  C), temperature source Oral, height 5' 9"  (1.753 m), weight 263 lb (119.3 kg), last menstrual period 05/26/2016, SpO2 99 %. Body mass index is 38.84 kg/m.  General: Cooperative, alert, well developed, in no acute distress. HEENT: Conjunctivae and lids unremarkable. Cardiovascular: Regular rhythm.  Lungs: Normal work of breathing. Neurologic: No focal deficits.   Lab Results  Component Value Date   CREATININE 0.81 08/13/2019   BUN 14 08/13/2019   NA 139 08/13/2019   K 4.6 08/13/2019   CL 102 08/13/2019   CO2 24 08/13/2019   Lab Results  Component Value Date   ALT 10 08/13/2019   AST 12 08/13/2019   ALKPHOS 55 08/13/2019   BILITOT 0.3 08/13/2019   Lab Results  Component Value Date   HGBA1C 5.3 08/13/2019   HGBA1C 5.3 10/23/2017   HGBA1C 5.0 10/01/2016   Lab Results  Component Value Date   INSULIN 19.0 08/13/2019   Lab Results  Component Value Date   TSH 0.081 (L) 08/13/2019   Lab Results  Component Value Date   CHOL 207 (H) 08/13/2019   HDL 42 08/13/2019   LDLCALC 136 (H) 08/13/2019   TRIG 159 (H) 08/13/2019   CHOLHDL 4.9 (H) 08/13/2019   Lab Results  Component Value Date   WBC 9.9 08/13/2019   HGB 13.3 08/13/2019   HCT 38.9 08/13/2019   MCV 91 08/13/2019   PLT 373 08/13/2019   Lab Results  Component Value Date   IRON 64 08/13/2019   TIBC 333 08/13/2019   FERRITIN 30 08/13/2019   Attestation Statements:   Reviewed by clinician on day of visit: allergies, medications, problem list, medical history, surgical history, family history, social history, and previous encounter notes.  I, Michaelene Song, am acting as Location manager for [employer], NP-C   I have reviewed the above documentation for accuracy and completeness, and I agree with the above. -  Esaw Grandchild, NP

## 2019-09-18 ENCOUNTER — Ambulatory Visit
Admission: RE | Admit: 2019-09-18 | Discharge: 2019-09-18 | Disposition: A | Discharge status: Home / Self Care | Payer: PRIVATE HEALTH INSURANCE | Source: Ambulatory Visit | Attending: Family Medicine | Admitting: Family Medicine

## 2019-09-18 VITALS — BP 137/88 | HR 77 | Temp 98.4°F | Resp 14

## 2019-09-18 DIAGNOSIS — M5441 Lumbago with sciatica, right side: Secondary | ICD-10-CM

## 2019-09-18 MED ORDER — CYCLOBENZAPRINE HCL 10 MG PO TABS
10.0000 mg | ORAL_TABLET | Freq: Two times a day (BID) | ORAL | 0 refills | Status: DC | PRN
Start: 2019-09-18 — End: 2020-04-08

## 2019-09-18 MED ORDER — IBUPROFEN 600 MG PO TABS
600.0000 mg | ORAL_TABLET | Freq: Four times a day (QID) | ORAL | 0 refills | Status: DC | PRN
Start: 2019-09-18 — End: 2020-03-31

## 2019-09-18 MED ORDER — PREDNISONE 10 MG (21) PO TBPK
ORAL_TABLET | Freq: Every day | ORAL | 0 refills | Status: AC
Start: 2019-09-18 — End: 2019-09-24

## 2019-09-18 NOTE — ED Triage Notes (Signed)
C/o right hip pain x1 day. Reports it "popped" this morning and felt better for a little while, but pain returned.

## 2019-09-18 NOTE — Discharge Instructions (Addendum)
Take the prescribed ibuprofen as needed for your pain.  Take the muscle relaxer Flexeril as needed for muscle spasm; do not drive, operate machinery, or drink alcohol with this medication as it may make you drowsy.    I have also sent in a steroid for you to take as well. Take 6 tablets on day one, 5 tablets on day two, 4 tablets on day three, 3 tablets on day four, 2 tablets on day five and one tablet on day 6.  Follow up with an orthopedist if your pain is not improving.  Go to the emergency department if you have worsening pain or develop new symptoms such as difficulty with urination, weakness, numbness, loss of control of your bladder or bowels, fever, chills or other concerns.

## 2019-09-18 NOTE — ED Provider Notes (Signed)
[place]   [id number] 09/18/19 Arrival Time: 1457  EX:BMWUX PAIN  SUBJECTIVE: History from: patient. Caitlin Barrett is a 49 y.o. female complains of right low back and right hip pain that began about 3 days ago. Denies a precipitating event or specific injury. Localizes the pain to the right low back and hip. Describes the pain as intermittent and achy in character.  Has tried OTC medications without relief. Symptoms are made worse with twisting and bending. Denies similar symptoms in the past.  Denies fever, chills, erythema, ecchymosis, effusion, weakness, numbness and tingling, saddle paresthesias, loss of bowel or bladder function.      ROS: As per HPI.  All other pertinent ROS negative.     Past Medical History:  Diagnosis Date  . Allergy    seasonal.  Takes allergy shots at [facility]  . Anemia   . Anxiety   . Arthritis   . Asthma    exercise induced--no inhalers  . Back pain   . Cancer ([place]) 2010   Basal cell skin cancer Lt eye, skin graft above Lt eye  . Constipation   . Costochondritis   . Depression   . Dysmenorrhea   . Eczema   . Edema, lower extremity   . GERD (gastroesophageal reflux disease)   . Headache   . Hypothyroid    Nodules and Fine needle aspiration  . IBS (irritable bowel syndrome)   . Myalgia   . Osteoarthritis   . Painful menstrual periods   . Palpitations   . Plantar fasciitis   . Shortness of breath   . Sprain of carpal (joint) of wrist   . Vitamin B 12 deficiency   . Vitamin D deficiency    Past Surgical History:  Procedure Laterality Date  . ABDOMINAL HYSTERECTOMY Bilateral 07/02/2016   Procedure: HYSTERECTOMY ABDOMINAL WITH BILATERAL SALPINGECTOMY-MIDLINE INCISION;  Surgeon: Brayton Mars, MD;  Location: [facility] OR[facility];  Service: Gynecology;  Laterality: Bilateral;  . CHOLECYSTECTOMY  2009  . HERNIA REPAIR  3244   umbilical  . SKIN GRAFT  2010  . TONSILLECTOMY     Allergies  Allergen Reactions  . Contrast Media  [Iodinated Diagnostic Agents]   . Codeine Nausea Only  . Other Hives    Uncoded Allergy. Allergen: contrast dye Uncoded Allergy. Allergen: contrast dye   No current facility-administered medications on file prior to encounter.   Current Outpatient Medications on File Prior to Encounter  Medication Sig Dispense Refill  . albuterol (VENTOLIN HFA) 108 (90 Base) MCG/ACT inhaler TAKE 2 PUFFS BY MOUTH EVERY 6 HOURS AS NEEDED FOR WHEEZE OR SHORTNESS OF BREATH 18 g 2  . baclofen (LIORESAL) 10 MG tablet TAKE 1/2 TO 1 TABLET BY MOUTH AT BEDTIME AS NEEDED FOR MUSCLE SPASM 30 tablet 2  . clobetasol cream (TEMOVATE) 0.10 % Apply 1 application topically 2 (two) times daily. For up to 1-2 weeks then as needed. Do not use on sensitive skin. 30 g 1  . COLLAGEN-BORON-HYALURONIC ACID PO Take by mouth.    . diphenhydrAMINE (BENADRYL ALLERGY) 25 mg capsule Take 25 mg by mouth every 6 (six) hours as needed.    Marland Kitchen EPINEPHrine 0.3 mg/0.3 mL IJ SOAJ injection 0.3 mg as needed.     Marland Kitchen estradiol (ESTRACE) 1 MG tablet Take 1 tablet (1 mg total) by mouth daily. 90 tablet 4  . Loratadine 10 MG CAPS Take by mouth.    . Melatonin 3 MG TABS Take by mouth at bedtime.     Marland Kitchen  meloxicam (MOBIC) 15 MG tablet Take 1 tablet (15 mg total) by mouth daily. (Patient taking differently: Take 15 mg by mouth daily as needed. ) 30 tablet 0  . Misc Natural Products (OSTEO BI-FLEX ADV TRIPLE ST PO) Take by mouth.    . Multiple Vitamins-Minerals (MULTIVITAMIN WOMEN PO) Take by mouth.    . ondansetron (ZOFRAN) 4 MG tablet Take by mouth as needed.     Marland Kitchen oxymetazoline (AFRIN) 0.05 % nasal spray Place 1 spray into both nostrils as needed.     . Probiotic Product (TRUBIOTICS) CAPS Take 1 capsule by mouth daily.    . progesterone (PROMETRIUM) 200 MG capsule Take 1 capsule (200 mg total) by mouth daily. 30 capsule 6  . triamcinolone cream (KENALOG) 0.1 % APPLY THIN FILM TWICE DAILY TO ECZEMA UNTIL CLEAR    . TURMERIC PO Take by mouth daily.    .  Vitamin D, Ergocalciferol, (DRISDOL) 1.25 MG (50000 UNIT) CAPS capsule Take 1 capsule (50,000 Units total) by mouth every 7 (seven) days. 4 capsule 0   Social History   Socioeconomic History  . Marital status: Married    Spouse name: Not on file  . Number of children: Not on file  . Years of education: Not on file  . Highest education level: Not on file  Occupational History  . Occupation: Games developer  Tobacco Use  . Smoking status: Never Smoker  . Smokeless tobacco: Never Used  Vaping Use  . Vaping Use: Never used  Substance and Sexual Activity  . Alcohol use: Not Currently    Comment: rare  . Drug use: No  . Sexual activity: Yes    Birth control/protection: Surgical  Other Topics Concern  . Not on file  Social History Narrative  . Not on file   Social Determinants of Health   Financial Resource Strain:   . Difficulty of Paying Living Expenses:   Food Insecurity:   . Worried About Charity fundraiser in the Last Year:   . Arboriculturist in the Last Year:   Transportation Needs:   . Film/video editor (Medical):   Marland Kitchen Lack of Transportation (Non-Medical):   Physical Activity:   . Days of Exercise per Week:   . Minutes of Exercise per Session:   Stress:   . Feeling of Stress :   Social Connections:   . Frequency of Communication with Friends and Family:   . Frequency of Social Gatherings with Friends and Family:   . Attends Religious Services:   . Active Member of Clubs or Organizations:   . Attends Archivist Meetings:   Marland Kitchen Marital Status:   Intimate Partner Violence:   . Fear of Current or Ex-Partner:   . Emotionally Abused:   Marland Kitchen Physically Abused:   . Sexually Abused:    Family History  Problem Relation Age of Onset  . Hyperlipidemia Mother   . Hypertension Mother   . Sleep apnea Mother   . Obesity Mother   . Cancer Father        lung CA  . Heart disease Maternal Grandmother   . Stroke Maternal Grandmother   . Hypertension  Maternal Grandmother   . Diabetes Maternal Grandmother   . Heart disease Maternal Grandfather   . Diabetes Maternal Grandfather   . Heart attack Maternal Grandfather     OBJECTIVE:  Vitals:   09/18/19 1505  BP: 137/88  Pulse: 77  Resp: 14  Temp: 98.4 F (36.9 C)  SpO2: 97%    General appearance: ALERT; in no acute distress.  Head: NCAT Lungs: Normal respiratory effort CV: radial and pedal pulses 2+ bilaterally. Cap refill < 2 seconds Musculoskeletal:  Inspection: Skin warm, dry, clear and intact without obvious erythema, effusion, or ecchymosis.  Palpation: right low back tender to palpation ROM: limited ROM with twisting  Skin: warm and dry Neurologic: Ambulates without difficulty; Sensation intact about the upper/ lower extremities Psychological: alert and cooperative; normal mood and affect  DIAGNOSTIC STUDIES:  No results found.   ASSESSMENT & PLAN:  1. Acute right-sided low back pain with right-sided sciatica     Prescribed ibuprofen Prescribed prednisone taper Prescribed cyclobenzaprine  Continue conservative management of rest, ice, and gentle stretches Take ibuprofen as needed for pain relief (may cause abdominal discomfort, ulcers, and GI bleeds avoid taking with other NSAIDs) Take cyclobenzaprine at nighttime for symptomatic relief. Avoid driving or operating heavy machinery while using medication. Follow up with PCP if symptoms persist Return or go to the ER if you have any new or worsening symptoms (fever, chills, chest pain, abdominal pain, changes in bowel or bladder habits, pain radiating into lower legs)  Reviewed expectations re: course of current medical issues. Questions answered. Outlined signs and symptoms indicating need for more acute intervention. Patient verbalized understanding. After Visit Summary given.       Faustino Congress, NP 09/18/19 1524

## 2019-10-08 ENCOUNTER — Ambulatory Visit (INDEPENDENT_AMBULATORY_CARE_PROVIDER_SITE_OTHER): Payer: PRIVATE HEALTH INSURANCE | Admitting: Adult Health

## 2019-10-08 ENCOUNTER — Encounter (INDEPENDENT_AMBULATORY_CARE_PROVIDER_SITE_OTHER): Payer: Self-pay | Admitting: Adult Health

## 2019-10-08 ENCOUNTER — Other Ambulatory Visit: Payer: Self-pay

## 2019-10-08 ENCOUNTER — Other Ambulatory Visit (INDEPENDENT_AMBULATORY_CARE_PROVIDER_SITE_OTHER): Payer: Self-pay | Admitting: Adult Health

## 2019-10-08 VITALS — BP 128/77 | HR 68 | Temp 98.0°F | Ht 69.0 in | Wt 260.0 lb

## 2019-10-08 DIAGNOSIS — R632 Polyphagia: Secondary | ICD-10-CM

## 2019-10-08 DIAGNOSIS — Z6838 Body mass index (BMI) 38.0-38.9, adult: Secondary | ICD-10-CM

## 2019-10-08 DIAGNOSIS — E559 Vitamin D deficiency, unspecified: Secondary | ICD-10-CM

## 2019-10-08 NOTE — Progress Notes (Signed)
Chief Complaint:   OBESITY Caitlin Barrett is here to discuss her progress with her obesity treatment plan along with follow-up of her obesity related diagnoses. Caitlin Barrett is on the Category 3 Plan and states she is following her eating plan approximately 80-85% of the time. Caitlin Barrett states she is walking at work.   Today's visit was #: 4 Starting weight: 277 lbs Starting date: 08/13/2019 Today's weight: 260 lbs Today's date: 10/08/2019 Total lbs lost to date: 17 Total lbs lost since last in-office visit: 3  Interim History: Caitlin Barrett states over the last few weeks her right-sided sciatica pain increased and she was put on a course of prednisone and muscle relaxers. She is working greater than 12 hours a day 7 days a week, which has increased her overall stress levels. She has noticed an increased in afternoon cravings due to the increased level of work stress.  Subjective:   Vitamin D deficiency. Caitlin Barrett is on prescription strength Vitamin D supplementation and denies nausea, vomiting, or muscle weakness.    Ref. Range 06/29/2019 15:27  Vitamin D, 25-Hydroxy Latest Ref Range: 30.0 - 100.0 ng/mL 24.3 (L)   Polyphagia. Caitlin Barrett notes an increase in afternoon cravings due to increased work stress. She denies history of seizures or kidney stones. She has been on bupropion in the past and denies medication side effects. Blood pressure is well controlled.  Assessment/Plan:   Vitamin D deficiency. Low Vitamin D level contributes to fatigue and are associated with obesity, breast, and colon cancer. She agrees to continue to take prescription Vitamin D as directed (no medication refill today) and will follow-up for routine testing of Vitamin D at the end of August.  Polyphagia. Caitlin Barrett will increase her water intake and continue to eat on plan consistently. If polyphagia continues, can consider initiating bupropion.  Class 2 severe obesity with serious comorbidity and body mass index  (BMI) of 38.0 to 38.9 in adult, unspecified obesity type (Caitlin Barrett).  Caitlin Barrett is currently in the action stage of change. As such, her goal is to continue with weight loss efforts. She has agreed to the Category 3 Plan.   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  Behavioral modification strategies: increasing lean protein intake, increasing water intake, meal planning and cooking strategies, better snacking choices and planning for success.  Caitlin Barrett has agreed to follow-up with our clinic in 3 weeks. She was informed of the importance of frequent follow-up visits to maximize her success with intensive lifestyle modifications for her multiple health conditions.   Objective:   Blood pressure 128/77, pulse 68, temperature 98 F (36.7 C), temperature source Oral, height 5' 9"  (1.753 m), weight 260 lb (117.9 kg), last menstrual period 05/26/2016, SpO2 97 %. Body mass index is 38.4 kg/m.  General: Cooperative, alert, well developed, in no acute distress. HEENT: Conjunctivae and lids unremarkable. Cardiovascular: Regular rhythm.  Lungs: Normal work of breathing. Neurologic: No focal deficits.   Lab Results  Component Value Date   CREATININE 0.81 08/13/2019   BUN 14 08/13/2019   NA 139 08/13/2019   K 4.6 08/13/2019   CL 102 08/13/2019   CO2 24 08/13/2019   Lab Results  Component Value Date   ALT 10 08/13/2019   AST 12 08/13/2019   ALKPHOS 55 08/13/2019   BILITOT 0.3 08/13/2019   Lab Results  Component Value Date   HGBA1C 5.3 08/13/2019   HGBA1C 5.3 10/23/2017   HGBA1C 5.0 10/01/2016  Lab Results  Component Value Date   INSULIN 19.0 08/13/2019   Lab Results  Component Value Date   TSH 0.081 (L) 08/13/2019   Lab Results  Component Value Date   CHOL 207 (H) 08/13/2019   HDL 42 08/13/2019   LDLCALC 136 (H) 08/13/2019   TRIG 159 (H) 08/13/2019   CHOLHDL 4.9 (H) 08/13/2019   Lab Results  Component Value Date   WBC 9.9 08/13/2019   HGB  13.3 08/13/2019   HCT 38.9 08/13/2019   MCV 91 08/13/2019   PLT 373 08/13/2019   Lab Results  Component Value Date   IRON 64 08/13/2019   TIBC 333 08/13/2019   FERRITIN 30 08/13/2019   Attestation Statements:   Reviewed by clinician on day of visit: allergies, medications, problem list, medical history, surgical history, family history, social history, and previous encounter notes.  Time spent on visit including pre-visit chart review and post-visit charting and care was 29 minutes.   I, Michaelene Song, am acting as Location manager for [employer], NP-C   I have reviewed the above documentation for accuracy and completeness, and I agree with the above. -  Esaw Grandchild, NP

## 2019-10-29 ENCOUNTER — Ambulatory Visit (INDEPENDENT_AMBULATORY_CARE_PROVIDER_SITE_OTHER): Payer: PRIVATE HEALTH INSURANCE | Admitting: Adult Health

## 2019-10-29 ENCOUNTER — Other Ambulatory Visit: Payer: Self-pay

## 2019-10-29 ENCOUNTER — Encounter (INDEPENDENT_AMBULATORY_CARE_PROVIDER_SITE_OTHER): Payer: Self-pay | Admitting: Adult Health

## 2019-10-29 VITALS — BP 152/87 | HR 65 | Temp 97.9°F | Ht 69.0 in | Wt 258.0 lb

## 2019-10-29 DIAGNOSIS — E559 Vitamin D deficiency, unspecified: Secondary | ICD-10-CM | POA: Diagnosis not present

## 2019-10-29 DIAGNOSIS — E8881 Metabolic syndrome: Secondary | ICD-10-CM | POA: Diagnosis not present

## 2019-10-29 DIAGNOSIS — Z6838 Body mass index (BMI) 38.0-38.9, adult: Secondary | ICD-10-CM | POA: Diagnosis not present

## 2019-10-29 NOTE — Progress Notes (Signed)
Chief Complaint:   OBESITY Caitlin Barrett is here to discuss her progress with her obesity treatment plan along with follow-up of her obesity related diagnoses. Caitlin Barrett is on the Category 3 Plan and states she is following her eating plan approximately 85% of the time. Caitlin Barrett states she is walking 5,000 steps daily at work.   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 She will often eat breakfast at home, bring snacks on plan while out, and return home for lunch. She will often eat out in the evening and stay by on plan by ordering protein and vegetables. She brings her scale with her when she eats out - great!  Subjective:   Vitamin D deficiency. Caitlin Barrett is on prescription strength Vitamin D supplementation and denies nausea, vomiting, or muscle weakness.    Ref. Range 06/29/2019 15:27  Vitamin D, 25-Hydroxy Latest Ref Range: 30.0 - 100.0 ng/mL 24.3 (L)   Insulin resistance. Caitlin Barrett has a diagnosis of insulin resistance based on her elevated fasting insulin level >5. She continues to work on diet and exercise to decrease her risk of diabetes. CMP on 08/13/2019 was stable. She reports resolution of polyphagia. She is not on metformin.  Lab Results  Component Value Date   INSULIN 19.0 08/13/2019   Lab Results  Component Value Date   HGBA1C 5.3 08/13/2019   Assessment/Plan:   Vitamin D deficiency. Low Vitamin D level contributes to fatigue and are associated with obesity, breast, and colon cancer. She agrees to continue to take prescription Vitamin D as directed (no medication refill today) and will follow-up for routine testing of Vitamin D every 3 months.  Insulin resistance. Caitlin Barrett will continue to work on weight loss, exercise, and  decreasing simple carbohydrates to help decrease the risk of diabetes. Caitlin Barrett agreed to follow-up with [place] as directed to closely monitor her progress. She will continue to follow the Category 3 meal plan. Labs will be checked every 3 months.  Class 2 severe obesity with serious comorbidity and body mass index (BMI) of 38.0 to 38.9 in adult, unspecified obesity type (Caitlin Barrett).  Caitlin Barrett is currently in the action stage of change. As such, her goal is to continue with weight loss efforts. She has agreed to the Category 3 Plan.   Handout was provided on Protein.  Exercise goals: Caitlin Barrett will continue her current exercise regimen.   Behavioral modification strategies: increasing lean protein intake, meal planning and cooking strategies, travel eating strategies and planning for success.  Caitlin Barrett has agreed to follow-up with our clinic in 3 weeks. She was informed of the importance of frequent follow-up visits to maximize her success with intensive lifestyle modifications for her multiple health conditions.   Objective:   Blood pressure (!) 152/87, pulse 65, temperature 97.9 F (36.6 C), temperature source Oral, height 5' 9"  (1.753 m), weight 258 lb (117 kg), last menstrual period 05/26/2016, SpO2 98 %. Body mass index is 38.1 kg/m.  General: Cooperative, alert, well developed, in no acute distress. HEENT: Conjunctivae and lids unremarkable. Cardiovascular: Regular rhythm.  Lungs: Normal work of breathing. Neurologic: No focal deficits.   Lab Results  Component Value Date   CREATININE 0.81 08/13/2019   BUN 14 08/13/2019   NA 139 08/13/2019   K 4.6  08/13/2019   CL 102 08/13/2019   CO2 24 08/13/2019   Lab Results  Component Value Date   ALT 10 08/13/2019   AST 12 08/13/2019   ALKPHOS 55 08/13/2019   BILITOT 0.3 08/13/2019   Lab Results  Component Value Date   HGBA1C 5.3 08/13/2019   HGBA1C 5.3 10/23/2017   HGBA1C 5.0 10/01/2016   Lab Results  Component Value Date   INSULIN  19.0 08/13/2019   Lab Results  Component Value Date   TSH 0.081 (L) 08/13/2019   Lab Results  Component Value Date   CHOL 207 (H) 08/13/2019   HDL 42 08/13/2019   LDLCALC 136 (H) 08/13/2019   TRIG 159 (H) 08/13/2019   CHOLHDL 4.9 (H) 08/13/2019   Lab Results  Component Value Date   WBC 9.9 08/13/2019   HGB 13.3 08/13/2019   HCT 38.9 08/13/2019   MCV 91 08/13/2019   PLT 373 08/13/2019   Lab Results  Component Value Date   IRON 64 08/13/2019   TIBC 333 08/13/2019   FERRITIN 30 08/13/2019   Attestation Statements:   Reviewed by clinician on day of visit: allergies, medications, problem list, medical history, surgical history, family history, social history, and previous encounter notes.  Time spent on visit including pre-visit chart review and post-visit charting and care was 27 minutes.   I, Michaelene Song, am acting as Location manager for [employer], NP-C   I have reviewed the above documentation for accuracy and completeness, and I agree with the above. -  Esaw Grandchild, NP

## 2019-11-02 ENCOUNTER — Other Ambulatory Visit (INDEPENDENT_AMBULATORY_CARE_PROVIDER_SITE_OTHER): Payer: Self-pay | Admitting: Adult Health

## 2019-11-02 DIAGNOSIS — E559 Vitamin D deficiency, unspecified: Secondary | ICD-10-CM

## 2019-11-25 ENCOUNTER — Other Ambulatory Visit: Payer: Self-pay

## 2019-11-25 ENCOUNTER — Ambulatory Visit (INDEPENDENT_AMBULATORY_CARE_PROVIDER_SITE_OTHER): Payer: PRIVATE HEALTH INSURANCE | Admitting: Adult Health

## 2019-11-25 ENCOUNTER — Encounter (INDEPENDENT_AMBULATORY_CARE_PROVIDER_SITE_OTHER): Payer: Self-pay | Admitting: Adult Health

## 2019-11-25 VITALS — BP 158/80 | HR 58 | Temp 97.7°F | Ht 69.0 in | Wt 252.0 lb

## 2019-11-25 DIAGNOSIS — I10 Essential (primary) hypertension: Secondary | ICD-10-CM | POA: Insufficient documentation

## 2019-11-25 DIAGNOSIS — Z6839 Body mass index (BMI) 39.0-39.9, adult: Secondary | ICD-10-CM

## 2019-11-25 DIAGNOSIS — E8881 Metabolic syndrome: Secondary | ICD-10-CM | POA: Diagnosis not present

## 2019-11-25 DIAGNOSIS — Z9189 Other specified personal risk factors, not elsewhere classified: Secondary | ICD-10-CM | POA: Diagnosis not present

## 2019-11-25 DIAGNOSIS — R030 Elevated blood-pressure reading, without diagnosis of hypertension: Secondary | ICD-10-CM

## 2019-11-25 DIAGNOSIS — E782 Mixed hyperlipidemia: Secondary | ICD-10-CM | POA: Diagnosis not present

## 2019-11-25 DIAGNOSIS — E559 Vitamin D deficiency, unspecified: Secondary | ICD-10-CM | POA: Diagnosis not present

## 2019-11-25 DIAGNOSIS — E7849 Other hyperlipidemia: Secondary | ICD-10-CM | POA: Insufficient documentation

## 2019-11-25 MED ORDER — VITAMIN D (ERGOCALCIFEROL) 1.25 MG (50000 UNIT) PO CAPS: 50000.0000 [IU] | ORAL_CAPSULE | ORAL | 0 refills | Status: DC

## 2019-11-25 NOTE — Progress Notes (Signed)
Chief Complaint:   OBESITY Caitlin Barrett is here to discuss her progress with her obesity treatment plan along with follow-up of her obesity related diagnoses. Caitlin Barrett is on the Category 3 Plan and states she is following her eating plan approximately 90% of the time. Caitlin Barrett states she is walking 20 minutes 7 times per week.  Today's visit was #: 6 Starting weight: 277 lbs Starting date: 08/13/2019 Today's weight: 252 lbs Today's date: 11/25/2019 Total lbs lost to date: 25 Total lbs lost since last in-office visit: 6  Interim History: Caitlin Barrett continues to enjoy the foods and structure of the Category 3 meal plan. She recently acute URI symptoms and has been tested multiples times for COVID-19, all of which were negative. She has been taking a daily decongestant. She is not fasting today, but was instructed to come in fasting at her next office visit.   Subjective:   Vitamin D deficiency. Caitlin Barrett is on Ergocalciferol. No nausea, vomiting, or muscle weakness.    Ref. Range 06/29/2019 15:27  Vitamin D, 25-Hydroxy Latest Ref Range: 30.0 - 100.0 ng/mL 24.3 (L)   Mixed hyperlipidemia. Caitlin Barrett is not on a statin and denies acute cardiac symptoms. She denies tobacco/vape use.  Lab Results  Component Value Date   CHOL 207 (H) 08/13/2019   HDL 42 08/13/2019   LDLCALC 136 (H) 08/13/2019   TRIG 159 (H) 08/13/2019   CHOLHDL 4.9 (H) 08/13/2019   Lab Results  Component Value Date   ALT 10 08/13/2019   AST 12 08/13/2019   ALKPHOS 55 08/13/2019   BILITOT 0.3 08/13/2019   The 10-year ASCVD risk score Caitlin Bussing DC Jr., Caitlin al., Caitlin Barrett) is: 2.1%   Values used to calculate the score:     Age: 27 years     Sex: Female     Is Non-Hispanic African American: No     Diabetic: No     Tobacco smoker: No     Systolic Blood Pressure: 160 mmHg     Is BP treated: No     HDL Cholesterol: 42 mg/dL     Total Cholesterol: 207 mg/dL  Insulin resistance. Caitlin Barrett has a diagnosis of insulin  resistance based on her elevated fasting insulin level >5. She continues to work on diet and exercise to decrease her risk of diabetes. Blood glucose and A1c were both within normal limits at last check; insulin level was elevated. Caitlin Barrett is not on metformin.  Lab Results  Component Value Date   INSULIN 19.0 08/13/2019   Lab Results  Component Value Date   HGBA1C 5.3 08/13/2019   Elevated BP without diagnosis of hypertension. Blood has been elevated at several office visits. Caitlin Barrett reports poor sleep, daily decongestant use (to treat acute URI), and persistent headache. She denies acute cardiac symptoms and denies tobacco/vape use.  BP Readings from Last 3 Encounters:  11/25/19 (!) 158/80  10/29/19 (!) 152/87  10/08/19 128/77   At risk for heart disease. Caitlin Barrett is at a higher than average risk for cardiovascular disease due to hyperlipidemia, elevated blood pressure, and obesity.   Assessment/Plan:   Vitamin D deficiency. Low Vitamin D level contributes to fatigue and are associated with obesity, breast, and colon cancer. She was given a refill on her Vitamin D, Ergocalciferol, (DRISDOL) 1.25 MG (50000 UNIT) CAPS capsule every week #4 with 0 refills and will follow-up for routine testing of Vitamin D at her next office visit.     Mixed hyperlipidemia. Cardiovascular risk and specific lipid/LDL  goals reviewed.  We discussed several lifestyle modifications today and Caitlin Barrett will continue to work on diet, exercise and weight loss efforts. Orders and follow up as documented in patient record. She will limit her intake of saturated fat. Labs will be checked at her next office visit.  Counseling Intensive lifestyle modifications are the first line treatment for this issue.  Dietary changes: Increase soluble fiber. Decrease simple carbohydrates.  Exercise changes: Moderate to vigorous-intensity aerobic activity 150 minutes per week if tolerated.  Lipid-lowering medications: see  documented in medical record.  Insulin resistance. Caitlin Barrett will continue to work on weight loss, exercise, and decreasing simple carbohydrates to help decrease the risk of diabetes. Caitlin Barrett agreed to follow-up with [place] as directed to closely monitor her progress. She will limit her carbohydrate and sugar intake and increase her protein intake. Labs will be checked at her next office visit.  Elevated BP without diagnosis of hypertension. Caitlin Barrett will limit her salt intake. If blood pressure is above goal at her next office visit, will discuss starting an ACE. Labs will be checked at her next office visit. 07/2019 CMP- GFR and K+ level both-WNL.  At risk for heart disease. Caitlin Barrett was given approximately 15 minutes of coronary artery disease prevention counseling today. She is 49 y.o. female and has risk factors for heart disease including obesity. We discussed intensive lifestyle modifications today with an emphasis on specific weight loss instructions and strategies.   Repetitive spaced learning was employed today to elicit superior memory formation and behavioral change.  Class 2 severe obesity with serious comorbidity and body mass index (BMI) of 39.0 to 39.9 in adult, unspecified obesity type (Caitlin Barrett).  Caitlin Barrett is currently in the action stage of change. As such, her goal is to continue with weight loss efforts. She has agreed to the Category 3 Plan.   Exercise goals: Caitlin Barrett will continue her current exercise regimen.   Behavioral modification strategies: increasing lean protein intake, increasing water intake, decreasing sodium intake, meal planning and cooking strategies, better snacking choices and planning for success.  Caitlin Barrett has agreed to follow-up with our clinic fasting in 3 weeks. She was informed of the importance of frequent follow-up visits to maximize her success with intensive lifestyle modifications for her multiple health conditions.   Objective:   Blood pressure (!)  158/80, pulse (!) 58, temperature 97.7 F (36.5 C), height 5' 9"  (1.753 m), weight 252 lb (114.3 kg), last menstrual period 05/26/2016, SpO2 98 %. Body mass index is 37.21 kg/m.  General: Cooperative, alert, well developed, in no acute distress. HEENT: Conjunctivae and lids unremarkable. Cardiovascular: Regular rhythm.  Lungs: Normal work of breathing. Neurologic: No focal deficits.   Lab Results  Component Value Date   CREATININE 0.81 08/13/2019   BUN 14 08/13/2019   NA 139 08/13/2019   K 4.6 08/13/2019   CL 102 08/13/2019   CO2 24 08/13/2019   Lab Results  Component Value Date   ALT 10 08/13/2019   AST 12 08/13/2019   ALKPHOS 55 08/13/2019   BILITOT 0.3 08/13/2019   Lab Results  Component Value Date   HGBA1C 5.3 08/13/2019   HGBA1C 5.3 10/23/2017   HGBA1C 5.0 10/01/2016   Lab Results  Component Value Date   INSULIN 19.0 08/13/2019   Lab Results  Component Value Date   TSH 0.081 (L) 08/13/2019   Lab Results  Component Value Date   CHOL 207 (H) 08/13/2019   HDL 42 08/13/2019   LDLCALC 136 (H) 08/13/2019   TRIG  159 (H) 08/13/2019   CHOLHDL 4.9 (H) 08/13/2019   Lab Results  Component Value Date   WBC 9.9 08/13/2019   HGB 13.3 08/13/2019   HCT 38.9 08/13/2019   MCV 91 08/13/2019   PLT 373 08/13/2019   Lab Results  Component Value Date   IRON 64 08/13/2019   TIBC 333 08/13/2019   FERRITIN 30 08/13/2019   Attestation Statements:   Reviewed by clinician on day of visit: allergies, medications, problem list, medical history, surgical history, family history, social history, and previous encounter notes.  I, Michaelene Song, am acting as Location manager for [employer], NP-C   I have reviewed the above documentation for accuracy and completeness, and I agree with the above. -  Esaw Grandchild, NP

## 2019-11-28 ENCOUNTER — Other Ambulatory Visit (INDEPENDENT_AMBULATORY_CARE_PROVIDER_SITE_OTHER): Payer: Self-pay | Admitting: Adult Health

## 2019-11-28 DIAGNOSIS — E559 Vitamin D deficiency, unspecified: Secondary | ICD-10-CM

## 2019-12-02 ENCOUNTER — Other Ambulatory Visit: Payer: Self-pay

## 2019-12-02 ENCOUNTER — Ambulatory Visit
Admission: RE | Admit: 2019-12-02 | Discharge: 2019-12-02 | Disposition: A | Discharge status: Home / Self Care | Payer: PRIVATE HEALTH INSURANCE | Source: Ambulatory Visit | Attending: Family Medicine | Admitting: Family Medicine

## 2019-12-02 VITALS — BP 136/85 | HR 78 | Temp 98.4°F | Resp 18 | Ht 69.0 in | Wt 255.0 lb

## 2019-12-02 DIAGNOSIS — R079 Chest pain, unspecified: Secondary | ICD-10-CM

## 2019-12-02 HISTORY — DX: COVID-19: U07.1

## 2019-12-02 NOTE — Discharge Instructions (Signed)
Rest.  Tylenol and/or ibuprofen as needed.  If worsens, go to the ER.  If persists and does not improve (but does not worsen), discuss referral to cardiology with Dr. Raliegh Ip.  Take care  Dr. Lacinda Axon

## 2019-12-02 NOTE — ED Triage Notes (Signed)
Patient c/o pressure in her chest that started yesterday. She states she has been having elevated BP. She also reports sinus drainage x 2-3 weeks.

## 2019-12-02 NOTE — ED Provider Notes (Signed)
[facility]    CSN: [id number] Arrival date & time: 12/02/19  1253      History   Chief Complaint Chief Complaint  Patient presents with  . Chest Pain  . Hypertension  . Nasal Congestion   HPI  49 year old female presents with the above complaints.  Patient reports chest pain and pressure for the past 2 to 3 days.  She states that her blood pressure has been elevated as well.  Patient reports ongoing sinus drainage/postnasal drip for the past 2 to 3 weeks.  No relieving factors.  She has taken previously prescribed medications for costochondritis without resolution.  No reports of shortness of breath.  No relieving factors.  No other associated symptoms.  No other complaints.  Past Medical History:  Diagnosis Date  . Allergy    seasonal.  Takes allergy shots at [facility]  . Anemia   . Anxiety   . Arthritis   . Asthma    exercise induced--no inhalers  . Back pain   . Cancer ([place]) 2010   Basal cell skin cancer Lt eye, skin graft above Lt eye  . Constipation   . Costochondritis   . Depression   . Dysmenorrhea   . Eczema   . Edema, lower extremity   . GERD (gastroesophageal reflux disease)   . Headache   . Hypothyroid    Nodules and Fine needle aspiration  . IBS (irritable bowel syndrome)   . Lab test positive for detection of COVID-19 virus    November 2020  . Myalgia   . Osteoarthritis   . Painful menstrual periods   . Palpitations   . Plantar fasciitis   . Shortness of breath   . Sprain of carpal (joint) of wrist   . Vitamin B 12 deficiency   . Vitamin D deficiency     Patient Active Problem List   Diagnosis Date Noted  . Mixed hyperlipidemia 11/25/2019  . Elevated BP without diagnosis of hypertension 11/25/2019  . Polyphagia 10/08/2019  . Vitamin D deficiency 09/17/2019  . Insulin resistance 09/17/2019  . 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 Class 2 severe obesity with serious comorbidity and body mass index (BMI) of 39.0 to 39.9 in  adult [facility]) 09/17/2019  . Exercise-induced asthma 04/22/2018  . Monilial vaginitis 02/26/2017  . Decreased libido 02/26/2017  . Anxiety with depression 02/26/2017  . Dyspareunia, female 02/26/2017  . S/P TAH (total abdominal hysterectomy) 07/02/2016  . 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 Nocturia 07/21/2015  . Stress incontinence 07/21/2015  . 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 Allergic rhinitis 06/21/2015  . Obesity (BMI 30-39.9) 06/21/2015    Past Surgical History:  Procedure Laterality Date  . ABDOMINAL HYSTERECTOMY Bilateral 07/02/2016   Procedure: HYSTERECTOMY ABDOMINAL WITH BILATERAL SALPINGECTOMY-MIDLINE INCISION;  Surgeon: Brayton Mars, MD;  Location: [facility] ORS;  Service: Gynecology;  Laterality: Bilateral;  . CHOLECYSTECTOMY  2009  . HERNIA REPAIR  8127   umbilical  . SKIN GRAFT  2010  . TONSILLECTOMY      OB History    Gravida  0   Para  0   Term  0   Preterm  0   AB  0   Living  0     SAB  0   TAB  0   Ectopic  0   Multiple  0   Live Births  0            Home Medications  Prior to Admission medications   Medication Sig Start Date End Date Taking? Authorizing Provider  albuterol (VENTOLIN HFA) 108 (90 Base) MCG/ACT inhaler TAKE 2 PUFFS BY MOUTH EVERY 6 HOURS AS NEEDED FOR WHEEZE OR SHORTNESS OF BREATH 12/09/18   Mikey College, NP  baclofen (LIORESAL) 10 MG tablet TAKE 1/2 TO 1 TABLET BY MOUTH AT BEDTIME AS NEEDED FOR MUSCLE SPASM 05/02/18   Mikey College, NP  clobetasol cream (TEMOVATE) 6.75 % Apply 1 application topically 2 (two) times daily. For up to 1-2 weeks then as needed. Do not use on sensitive skin. 04/24/19   Karamalegos, Devonne Doughty, DO  COLLAGEN-BORON-HYALURONIC ACID PO Take by mouth.    [provider]  cyclobenzaprine (FLEXERIL) 10 MG tablet Take 1 tablet (10 mg total) by mouth 2 (two) times daily as needed for muscle spasms. 09/18/19   Faustino Congress, NP    diphenhydrAMINE (BENADRYL ALLERGY) 25 mg capsule Take 25 mg by mouth every 6 (six) hours as needed.    [provider]  EPINEPHrine 0.3 mg/0.3 mL IJ SOAJ injection 0.3 mg as needed.  09/25/08   [provider]  estradiol (ESTRACE) 1 MG tablet Take 1 tablet (1 mg total) by mouth daily. 08/11/19   Philip Aspen, CNM  ibuprofen (ADVIL) 600 MG tablet Take 1 tablet (600 mg total) by mouth every 6 (six) hours as needed. 09/18/19   Faustino Congress, NP  Loratadine 10 MG CAPS Take by mouth.    [provider]  Melatonin 3 MG TABS Take by mouth at bedtime.     [provider]  Misc Natural Products (OSTEO BI-FLEX ADV TRIPLE ST PO) Take by mouth.    [provider]  Multiple Vitamins-Minerals (MULTIVITAMIN WOMEN PO) Take by mouth.    [provider]  ondansetron (ZOFRAN) 4 MG tablet Take by mouth as needed.  01/29/19   [provider]  oxymetazoline (AFRIN) 0.05 % nasal spray Place 1 spray into both nostrils as needed.     [provider]  Probiotic Product (TRUBIOTICS) CAPS Take 1 capsule by mouth daily.    [provider]  progesterone (PROMETRIUM) 200 MG capsule Take 1 capsule (200 mg total) by mouth daily. 07/02/19   Philip Aspen, CNM  triamcinolone cream (KENALOG) 0.1 % APPLY THIN FILM TWICE DAILY TO ECZEMA UNTIL CLEAR 12/23/18   [provider]  TURMERIC PO Take by mouth daily.    [provider]  Vitamin D, Ergocalciferol, (DRISDOL) 1.25 MG (50000 UNIT) CAPS capsule Take 1 capsule (50,000 Units total) by mouth every 7 (seven) days. 11/25/19   DanfordBerna Spare, NP    Family History Family History  Problem Relation Age of Onset  . Hyperlipidemia Mother   . Hypertension Mother   . Sleep apnea Mother   . Obesity Mother   . Cancer Father        lung CA  . Heart disease Maternal Grandmother   . Stroke Maternal Grandmother   . Hypertension Maternal Grandmother   . Diabetes Maternal Grandmother   .  Heart disease Maternal Grandfather   . Diabetes Maternal Grandfather   . Heart attack Maternal Grandfather     Social History Social History   Tobacco Use  . Smoking status: Never Smoker  . Smokeless tobacco: Never Used  Vaping Use  . Vaping Use: Never used  Substance Use Topics  . Alcohol use: Not Currently    Comment: rare  . Drug use: No     Allergies  Contrast media [iodinated diagnostic agents], Codeine, and Other   Review of Systems Review of Systems  HENT: Positive for postnasal drip.   Cardiovascular: Positive for chest pain.   Physical Exam Triage Vital Signs ED Triage Vitals  Enc Vitals Group     BP 12/02/19 1312 136/85     Pulse Rate 12/02/19 1312 78     Resp 12/02/19 1312 18     Temp 12/02/19 1312 98.4 F (36.9 C)     Temp Source 12/02/19 1312 Oral     SpO2 12/02/19 1312 98 %     Weight 12/02/19 1310 255 lb (115.7 kg)     Height 12/02/19 1310 5' 9"  (1.753 m)     Head Circumference --      Peak Flow --      Pain Score 12/02/19 1310 6     Pain Loc --      Pain Edu? --      Excl. in [place]? --    Updated Vital Signs BP 136/85 (BP Location: Right Arm)   Pulse 78   Temp 98.4 F (36.9 C) (Oral)   Resp 18   Ht 5' 9"  (1.753 m)   Wt 115.7 kg   LMP 05/26/2016 (Exact Date)   SpO2 98%   BMI 37.66 kg/m   Visual Acuity Right Eye Distance:   Left Eye Distance:   Bilateral Distance:    Right Eye Near:   Left Eye Near:    Bilateral Near:     Physical Exam Vitals and nursing note reviewed.  Constitutional:      General: She is not in acute distress.    Appearance: Normal appearance. She is obese. She is not ill-appearing.  HENT:     Head: Normocephalic and atraumatic.  Eyes:     General:        Right eye: No discharge.        Left eye: No discharge.     Conjunctiva/sclera: Conjunctivae normal.  Cardiovascular:     Rate and Rhythm: Normal rate and regular rhythm.  Pulmonary:     Effort: Pulmonary effort is normal.     Breath sounds: Normal  breath sounds. No wheezing, rhonchi or rales.  Neurological:     Mental Status: She is alert.    UC Treatments / Results  Labs (all labs ordered are listed, but only abnormal results are displayed) Labs Reviewed - No data to display  EKG Interpretation: Normal sinus rhythm at the rate of 64.  Normal axis.  Normal intervals.  No ST-T wave changes.  Normal EKG.  Radiology No results found.  Procedures Procedures (including critical care time)  Medications Ordered in UC Medications - No data to display  Initial Impression / Assessment and Plan / UC Course  I have reviewed the triage vital signs and the nursing notes.  Pertinent labs & imaging results that were available during my care of the patient were reviewed by me and considered in my medical decision making (see chart for details).    49 year old female presents with chest pain.  Normal EKG.  Do not feel the patient is experiencing cardiac chest pain.  Advise rest and over-the-counter Tylenol and ibuprofen.  Advised to go to the hospital if she worsens.  If chest pain persists, she is to discuss referral to cardiology with her primary care physician.  Final Clinical Impressions(s) / UC Diagnoses   Final diagnoses:  Chest pain, unspecified type     Discharge Instructions  Rest.  Tylenol and/or ibuprofen as needed.  If worsens, go to the ER.  If persists and does not improve (but does not worsen), discuss referral to cardiology with Dr. Raliegh Ip.  Take care  Dr. Lacinda Axon    ED Prescriptions    None     PDMP not reviewed this encounter.   Coral Spikes, DO 12/02/19 1600

## 2019-12-15 ENCOUNTER — Other Ambulatory Visit: Payer: Self-pay

## 2019-12-15 ENCOUNTER — Ambulatory Visit (INDEPENDENT_AMBULATORY_CARE_PROVIDER_SITE_OTHER): Payer: PRIVATE HEALTH INSURANCE | Admitting: Adult Health

## 2019-12-15 ENCOUNTER — Encounter (INDEPENDENT_AMBULATORY_CARE_PROVIDER_SITE_OTHER): Payer: Self-pay | Admitting: Adult Health

## 2019-12-15 VITALS — BP 112/62 | HR 62 | Temp 97.7°F | Ht 69.0 in | Wt 252.0 lb

## 2019-12-15 DIAGNOSIS — R030 Elevated blood-pressure reading, without diagnosis of hypertension: Secondary | ICD-10-CM | POA: Diagnosis not present

## 2019-12-15 DIAGNOSIS — E559 Vitamin D deficiency, unspecified: Secondary | ICD-10-CM

## 2019-12-15 DIAGNOSIS — E8881 Metabolic syndrome: Secondary | ICD-10-CM

## 2019-12-15 DIAGNOSIS — Z9189 Other specified personal risk factors, not elsewhere classified: Secondary | ICD-10-CM | POA: Diagnosis not present

## 2019-12-15 DIAGNOSIS — E782 Mixed hyperlipidemia: Secondary | ICD-10-CM | POA: Diagnosis not present

## 2019-12-15 DIAGNOSIS — Z6837 Body mass index (BMI) 37.0-37.9, adult: Secondary | ICD-10-CM

## 2019-12-16 LAB — COMPREHENSIVE METABOLIC PANEL
ALT: 15 IU/L (ref 0–32)
AST: 8 IU/L (ref 0–40)
Albumin/Globulin Ratio: 1.8 (ref 1.2–2.2)
Albumin: 4.3 g/dL (ref 3.8–4.8)
Alkaline Phosphatase: 52 IU/L (ref 44–121)
BUN/Creatinine Ratio: 27 — ABNORMAL HIGH (ref 9–23)
BUN: 17 mg/dL (ref 6–24)
Bilirubin Total: 0.2 mg/dL (ref 0.0–1.2)
CO2: 24 mmol/L (ref 20–29)
Calcium: 9 mg/dL (ref 8.7–10.2)
Chloride: 102 mmol/L (ref 96–106)
Creatinine, Ser: 0.64 mg/dL (ref 0.57–1.00)
GFR calc Af Amer: 123 mL/min/{1.73_m2} (ref >59)
GFR calc non Af Amer: 107 mL/min/{1.73_m2} (ref >59)
Globulin, Total: 2.4 g/dL (ref 1.5–4.5)
Glucose: 94 mg/dL (ref 65–99)
Potassium: 4.5 mmol/L (ref 3.5–5.2)
Sodium: 138 mmol/L (ref 134–144)
Total Protein: 6.7 g/dL (ref 6.0–8.5)

## 2019-12-16 LAB — LIPID PANEL
Chol/HDL Ratio: 5.7 ratio — ABNORMAL HIGH (ref 0.0–4.4)
Cholesterol, Total: 187 mg/dL (ref 100–199)
HDL: 33 mg/dL — ABNORMAL LOW (ref >39)
LDL Chol Calc (NIH): 128 mg/dL — ABNORMAL HIGH (ref 0–99)
Triglycerides: 145 mg/dL (ref 0–149)
VLDL Cholesterol Cal: 26 mg/dL (ref 5–40)

## 2019-12-16 LAB — INSULIN, RANDOM: INSULIN: 13.5 u[IU]/mL (ref 2.6–24.9)

## 2019-12-16 LAB — HEMOGLOBIN A1C
Est. average glucose Bld gHb Est-mCnc: 111 mg/dL
Hgb A1c MFr Bld: 5.5 % (ref 4.8–5.6)

## 2019-12-16 LAB — VITAMIN D 25 HYDROXY (VIT D DEFICIENCY, FRACTURES): Vit D, 25-Hydroxy: 36.7 ng/mL (ref 30.0–100.0)

## 2019-12-17 NOTE — Progress Notes (Signed)
Chief Complaint:   OBESITY Caitlin Barrett is here to discuss her progress with her obesity treatment plan along with follow-up of her obesity related diagnoses. Caitlin Barrett is on the Category 3 Plan and states she is following her eating plan approximately 85% of the time. Caitlin Barrett states she is exercising 0 minutes 0 times per week.  Today's visit was #: 7 Starting weight: 277 lbs Starting date: 08/13/2019 Today's weight: 252 lbs Today's date: 12/15/2019 Total lbs lost to date: 25 Total lbs lost since last in-office visit: 0  Interim History: Caitlin Barrett was seen in the ED on 12/02/2019 for chest pain - vital signs stable and EKG was normal. She was advised to rest and use OTC pain medicines. She has also started OTC omeprazole to help relieve GERD symptoms. She reports minor nasal drainage with intermittent acid reflux/chest pressure. She and her husband will be traveling to [place] today for a vacation.  Subjective:   Elevated BP without diagnosis of hypertension. Blood pressure/HR is at goal today. Caitlin Barrett denies chest pain, dyspnea, or palpitations with exertion. She reports her sister has hypertension and her mother has hypertension and hyperlipidemia. Recent ED visit for chest pain showed vital signs stable and EKG to be normal.  BP Readings from Last 3 Encounters:  12/15/19 112/62  12/02/19 136/85  11/25/19 (!) 158/80   Mixed hyperlipidemia. Total, LDL, and triglycerides were all above goal at last check. Caitlin Barrett is not on a statin. She denies chest pain, dyspnea, or palpitations with exertion. Her mother has hyperlipidemia.  Insulin resistance. Caitlin Barrett has a diagnosis of insulin resistance based on her elevated fasting insulin level >5. She continues to work on diet and exercise to decrease her risk of diabetes. Insulin level was elevated at last check. Caitlin Barrett denies polyphagia and is not on metformin.  Lab Results  Component Value Date   INSULIN 19.0 08/13/2019    Vitamin D deficiency. Caitlin Barrett is on Ergocalciferol. No nausea, vomiting, or muscle weakness.    Ref. Range 06/29/2019 15:27  Vitamin D, 25-Hydroxy Latest Ref Range: 30.0 - 100.0 ng/mL 24.3 (L)   At risk for heart disease. Caitlin Barrett is at a higher than average risk for cardiovascular disease due to hyperlipidemia and obesity.   Assessment/Plan:   Elevated BP without diagnosis of hypertension.  Caitlin Barrett will follow-up with her PCP regarding intermittent chest pressure for a referral to Cardiology. Labs will be checked today. If red flag symptoms develop, she will seek immediate medical assistance. Caitlin Barrett verbalized understanding and agreement.  Mixed hyperlipidemia. Cardiovascular risk and specific lipid/LDL goals reviewed.  We discussed several lifestyle modifications today and Caitlin Barrett will continue to work on diet, exercise and weight loss efforts. Orders and follow up as documented in patient record. Labs will be checked today.  Counseling Intensive lifestyle modifications are the first line treatment for this issue. . Dietary changes: Increase soluble fiber. Decrease simple carbohydrates. . Exercise changes: Moderate to vigorous-intensity aerobic activity 150 minutes per week if tolerated. . Lipid-lowering medications: see documented in medical record.   Insulin resistance. Caitlin Barrett will continue to work on weight loss, exercise, and decreasing simple carbohydrates to help decrease the risk of diabetes. Caitlin Barrett agreed to follow-up with [place] as directed to closely monitor her progress. She will continue to follow the Category 3 meal plan. Labs will be checked today.  Vitamin D deficiency. Low Vitamin D level contributes to fatigue and are associated with obesity, breast, and colon cancer. VITAMIN D 25 Hydroxy (Vit-D Deficiency, Fractures) level will  be checked today.   At risk for heart disease. Caitlin Barrett was given approximately 15 minutes of coronary artery disease prevention counseling  today. She is 49 y.o. female and has risk factors for heart disease including obesity. We discussed intensive lifestyle modifications today with an emphasis on specific weight loss instructions and strategies.   Repetitive spaced learning was employed today to elicit superior memory formation and behavioral change.  Class 2 severe obesity with serious comorbidity and body mass index (BMI) of 37.0 to 37.9 in adult, unspecified obesity type (Caitlin Barrett).  Caitlin Barrett is currently in the action stage of change. As such, her goal is to continue with weight loss efforts. She has agreed to the Category 3 Plan.   Exercise goals: No exercise has been prescribed at this time.  Behavioral modification strategies: increasing lean protein intake, meal planning and cooking strategies, travel eating strategies and planning for success.  Caitlin Barrett has agreed to follow-up with our clinic in 3-4 weeks. She was informed of the importance of frequent follow-up visits to maximize her success with intensive lifestyle modifications for her multiple health conditions.   Caitlin Barrett was informed we would discuss her lab results at her next visit unless there is a critical issue that needs to be addressed sooner. Caitlin Barrett agreed to keep her next visit at the agreed upon time to discuss these results.  Objective:   Blood pressure 112/62, pulse 62, temperature 97.7 F (36.5 C), height 5' 9"  (1.753 m), weight 252 lb (114.3 kg), last menstrual period 05/26/2016, SpO2 98 %. Body mass index is 37.21 kg/m.  General: Cooperative, alert, well developed, in no acute distress. HEENT: Conjunctivae and lids unremarkable. Cardiovascular: Regular rhythm.  Lungs: Normal work of breathing. Neurologic: No focal deficits.   Lab Results  Component Value Date   HGBA1C 5.3 08/13/2019   HGBA1C 5.3 10/23/2017   HGBA1C 5.0 10/01/2016   Lab Results  Component Value Date   INSULIN 19.0 08/13/2019   Lab Results  Component Value Date   TSH  0.081 (L) 08/13/2019   Lab Results  Component Value Date   WBC 9.9 08/13/2019   HGB 13.3 08/13/2019   HCT 38.9 08/13/2019   MCV 91 08/13/2019   PLT 373 08/13/2019   Lab Results  Component Value Date   IRON 64 08/13/2019   TIBC 333 08/13/2019   FERRITIN 30 08/13/2019   Attestation Statements:   Reviewed by clinician on day of visit: allergies, medications, problem list, medical history, surgical history, family history, social history, and previous encounter notes.  I, Michaelene Song, am acting as Location manager for [employer], NP-C   I have reviewed the above documentation for accuracy and completeness, and I agree with the above. -  Esaw Grandchild, NP

## 2019-12-23 ENCOUNTER — Other Ambulatory Visit (INDEPENDENT_AMBULATORY_CARE_PROVIDER_SITE_OTHER): Payer: Self-pay | Admitting: Adult Health

## 2019-12-23 DIAGNOSIS — E559 Vitamin D deficiency, unspecified: Secondary | ICD-10-CM

## 2020-01-06 ENCOUNTER — Other Ambulatory Visit: Payer: Self-pay

## 2020-01-06 ENCOUNTER — Ambulatory Visit (INDEPENDENT_AMBULATORY_CARE_PROVIDER_SITE_OTHER): Payer: PRIVATE HEALTH INSURANCE | Admitting: Adult Health

## 2020-01-06 ENCOUNTER — Encounter (INDEPENDENT_AMBULATORY_CARE_PROVIDER_SITE_OTHER): Payer: Self-pay | Admitting: Adult Health

## 2020-01-06 VITALS — BP 123/85 | HR 63 | Temp 97.5°F | Ht 69.0 in | Wt 247.0 lb

## 2020-01-06 DIAGNOSIS — E559 Vitamin D deficiency, unspecified: Secondary | ICD-10-CM

## 2020-01-06 DIAGNOSIS — Z9189 Other specified personal risk factors, not elsewhere classified: Secondary | ICD-10-CM | POA: Diagnosis not present

## 2020-01-06 DIAGNOSIS — E782 Mixed hyperlipidemia: Secondary | ICD-10-CM | POA: Diagnosis not present

## 2020-01-06 DIAGNOSIS — J45990 Exercise induced bronchospasm: Secondary | ICD-10-CM | POA: Diagnosis not present

## 2020-01-06 DIAGNOSIS — Z6836 Body mass index (BMI) 36.0-36.9, adult: Secondary | ICD-10-CM

## 2020-01-06 MED ORDER — VITAMIN D (ERGOCALCIFEROL) 1.25 MG (50000 UNIT) PO CAPS: 50000.0000 [IU] | ORAL_CAPSULE | ORAL | 0 refills | Status: DC

## 2020-01-06 NOTE — Progress Notes (Signed)
Chief Complaint:   OBESITY Caitlin Barrett is here to discuss her progress with her obesity treatment plan along with follow-up of her obesity related diagnoses. Caitlin Barrett is on the Category 3 Plan and states she is following her eating plan approximately 85% of the time. [name] states she is exercising 0 minutes 0 times per week.  Today's visit was #: 8 Starting weight: 277 lbs Starting date: 08/13/2019 Today's weight: 247 lbs Today's date: 01/06/2020 Total lbs lost to date: 30 Total lbs lost since last in-office visit: 5  Interim History: Caitlin Barrett and her husband traveled to [place] for 6 days. They walked up to 8 miles a day and enjoyed eating out frequently. She reports gaining a few lbs on vacation, however, when she returned home she got back on track. She estimates to drink greater than 100 oz of water a day.  She is down 5 lbs since last OV!  Subjective:   Vitamin D deficiency. 12/15/2019 Vitamin D level 36.7, which is below goal of 50. Fayetta is on Ergocalciferol. No nausea, vomiting, or muscle weakness.    Ref. Range 12/15/2019 08:33  Vitamin D, 25-Hydroxy Latest Ref Range: 30.0 - 100.0 ng/mL 36.7   Mixed hyperlipidemia. 12/15/2019 lipid panel showed total cholesterol 1878, triglycerides 145, HDL 33, and LDL 128 - levels improving! Glinda is not on a statin. ASCVD risk is 1.7%.   Lab Results  Component Value Date   CHOL 187 12/15/2019   HDL 33 (L) 12/15/2019   LDLCALC 128 (H) 12/15/2019   TRIG 145 12/15/2019   CHOLHDL 5.7 (H) 12/15/2019   Lab Results  Component Value Date   ALT 15 12/15/2019   AST 8 12/15/2019   ALKPHOS 52 12/15/2019   BILITOT 0.2 12/15/2019   The 10-year ASCVD risk score [name], et al., 2013) is: 1.7%   Values used to calculate the score:     Age: 49 years     Sex: Female     Is Non-Hispanic African American: No     Diabetic: No     Tobacco smoker: No     Systolic Blood Pressure: 154 mmHg     Is BP treated: No      HDL Cholesterol: 33 mg/dL     Total Cholesterol: 187 mg/dL  Exercise-induced asthma. Caitlin Barrett denies needing Albuterol prior to exercise, but will occasionally use after exercise. She denies tobacco/vape use.  At risk for diabetes mellitus. Caitlin Barrett is at higher than average risk for developing diabetes due to insulin resistance, recent elevated A1c, and obesity.   Assessment/Plan:   Vitamin D deficiency. Low Vitamin D level contributes to fatigue and are associated with obesity, breast, and colon cancer. She was given a refill on her Vitamin D, Ergocalciferol, (DRISDOL) 1.25 MG (50000 UNIT) CAPS capsule every week #4 with 0 refills and will follow-up for routine testing of Vitamin D, at least 2-3 times per year to avoid over-replacement.   Mixed hyperlipidemia. Cardiovascular risk and specific lipid/LDL goals reviewed.  We discussed several lifestyle modifications today and Rogan will continue to work on diet, exercise and weight loss efforts. Orders and follow up as documented in patient record. She will continue to decrease saturated fat and increase exercise in addition to dog walking.  Counseling Intensive lifestyle modifications are the first line treatment for this issue. . Dietary changes: Increase soluble fiber. Decrease simple carbohydrates. . Exercise changes: Moderate to vigorous-intensity aerobic activity 150 minutes per week if tolerated. . Lipid-lowering medications: see  documented in medical record.  Exercise-induced asthma. [name] will use Albuterol PRN symptoms. She will continue to avoid tobacco/vape use.  At risk for diabetes mellitus. Caitlin Barrett was given approximately 15 minutes of diabetes education and counseling today. We discussed intensive lifestyle modifications today with an emphasis on weight loss as well as increasing exercise and decreasing simple carbohydrates in her diet. We also reviewed medication options with an emphasis on risk versus benefit of those  discussed.   Repetitive spaced learning was employed today to elicit superior memory formation and behavioral change.  Class 2 severe obesity with serious comorbidity and body mass index (BMI) of 36.0 to 36.9 in adult, unspecified obesity type ([place]).  [name] is currently in the action stage of change. As such, her goal is to continue with weight loss efforts. She has agreed to the Category 3 Plan.   Exercise goals: Caitlin Barrett will increase daily walking and gym workouts.  Behavioral modification strategies: increasing lean protein intake, decreasing simple carbohydrates, meal planning and cooking strategies and planning for success.  [name] has agreed to follow-up with our clinic in 4 weeks. She was informed of the importance of frequent follow-up visits to maximize her success with intensive lifestyle modifications for her multiple health conditions.   Objective:   Blood pressure 123/85, pulse 63, temperature (!) 97.5 F (36.4 C), height 5' 9"  (1.753 m), weight 247 lb (112 kg), last menstrual period 05/26/2016, SpO2 98 %. Body mass index is 36.48 kg/m.  General: Cooperative, alert, well developed, in no acute distress. HEENT: Conjunctivae and lids unremarkable. Cardiovascular: Regular rhythm.  Lungs: Normal work of breathing. Neurologic: No focal deficits.   Lab Results  Component Value Date   CREATININE 0.64 12/15/2019   BUN 17 12/15/2019   NA 138 12/15/2019   K 4.5 12/15/2019   CL 102 12/15/2019   CO2 24 12/15/2019   Lab Results  Component Value Date   ALT 15 12/15/2019   AST 8 12/15/2019   ALKPHOS 52 12/15/2019   BILITOT 0.2 12/15/2019   Lab Results  Component Value Date   HGBA1C 5.5 12/15/2019   HGBA1C 5.3 08/13/2019   HGBA1C 5.3 10/23/2017   HGBA1C 5.0 10/01/2016   Lab Results  Component Value Date   INSULIN 13.5 12/15/2019   INSULIN 19.0 08/13/2019   Lab Results  Component Value Date   TSH 0.081 (L) 08/13/2019   Lab Results  Component Value Date    CHOL 187 12/15/2019   HDL 33 (L) 12/15/2019   LDLCALC 128 (H) 12/15/2019   TRIG 145 12/15/2019   CHOLHDL 5.7 (H) 12/15/2019   Lab Results  Component Value Date   WBC 9.9 08/13/2019   HGB 13.3 08/13/2019   HCT 38.9 08/13/2019   MCV 91 08/13/2019   PLT 373 08/13/2019   Lab Results  Component Value Date   IRON 64 08/13/2019   TIBC 333 08/13/2019   FERRITIN 30 08/13/2019   Attestation Statements:   Reviewed by clinician on day of visit: allergies, medications, problem list, medical history, surgical history, family history, social history, and previous encounter notes.  I, Michaelene Song, am acting as Location manager for [employer], NP-C   I have reviewed the above documentation for accuracy and completeness, and I agree with the above. -  Kline Bulthuis d. Ladashia Demarinis, NP-C

## 2020-01-12 IMAGING — DX DG RIBS W/ CHEST 3+V*L*
5 series · 5 of 5 positions shown · non-contrast
Comparison: 10/07/2017.

CLINICAL DATA: Chest pain.  No known injury.

EXAM:
LEFT RIBS AND CHEST - 3+ VIEW

[chest pa]
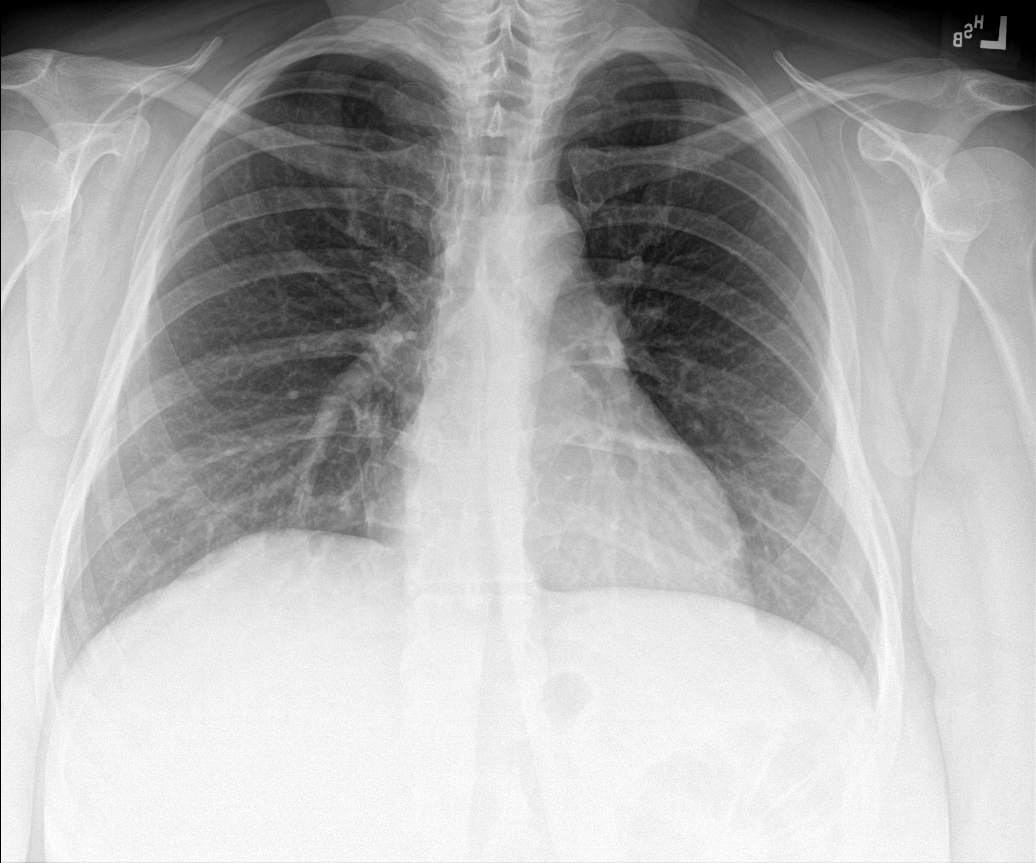

[rib pa (1 of 2)]
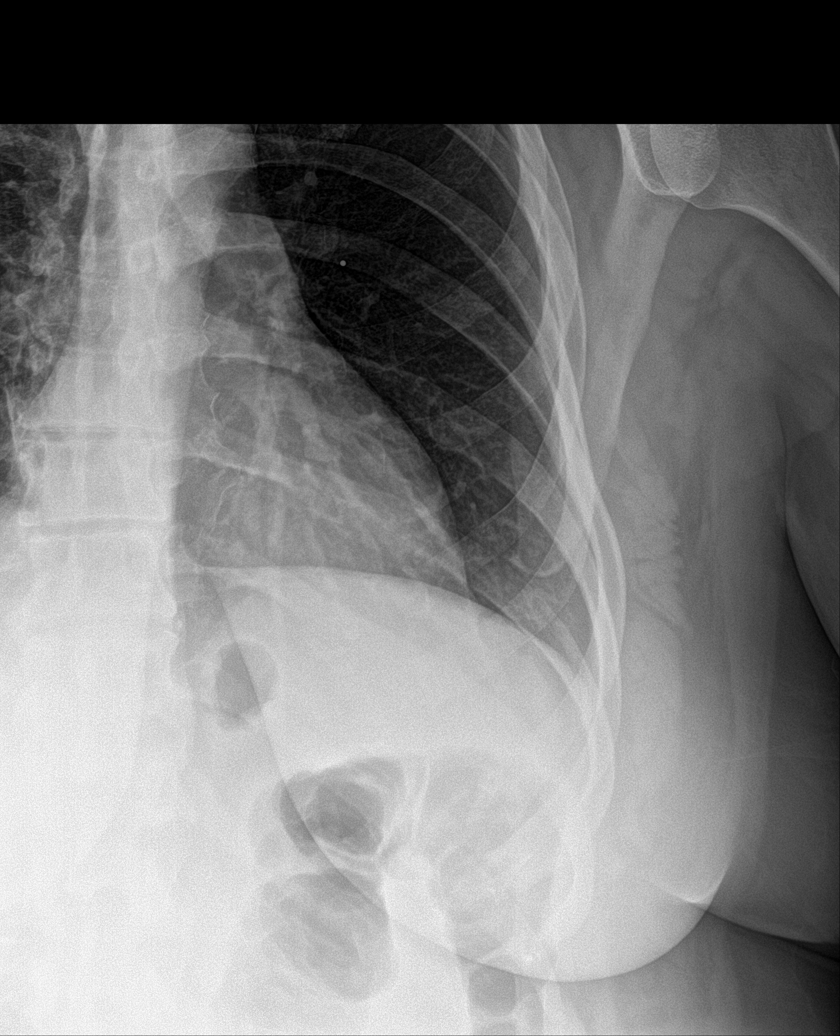

[rib pa (2 of 2)]
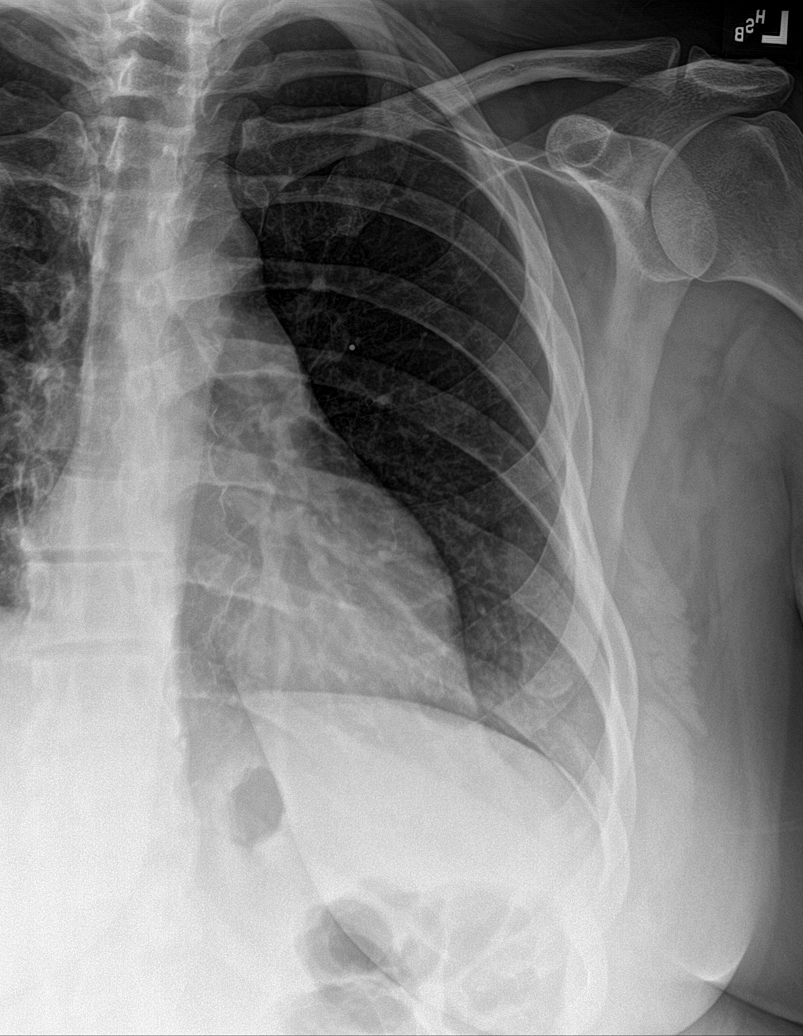

[rib obl (1 of 2)]
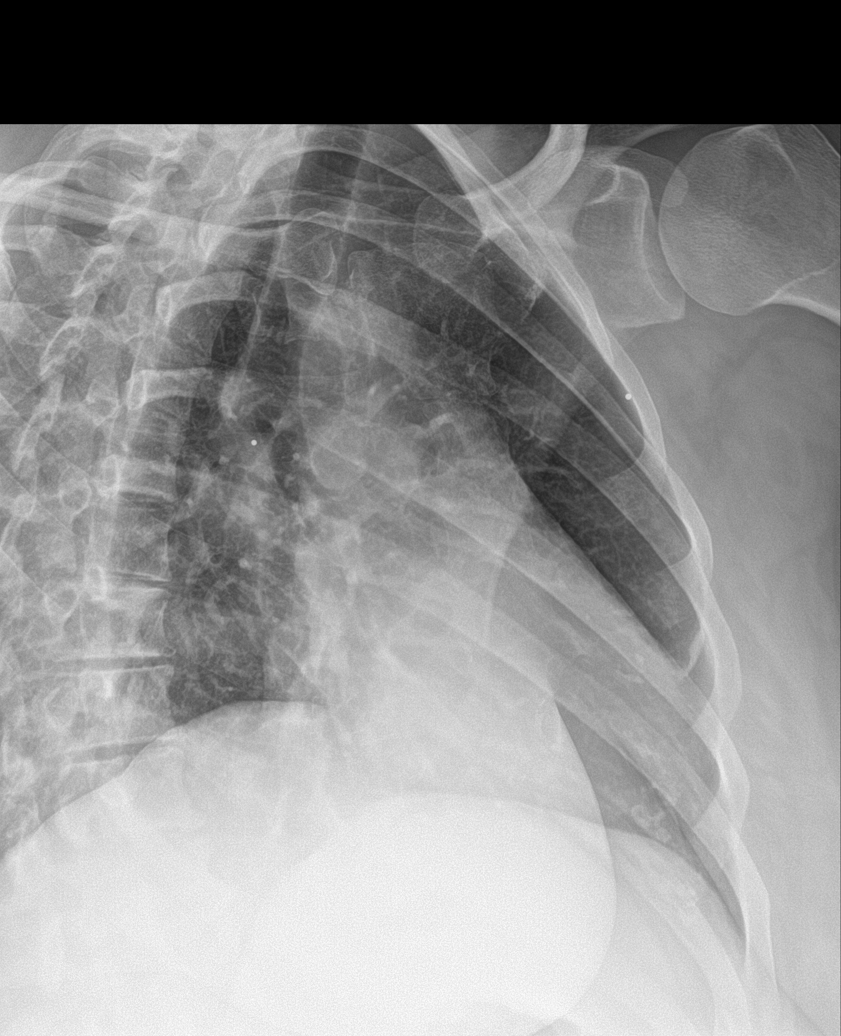

[rib obl (2 of 2)]
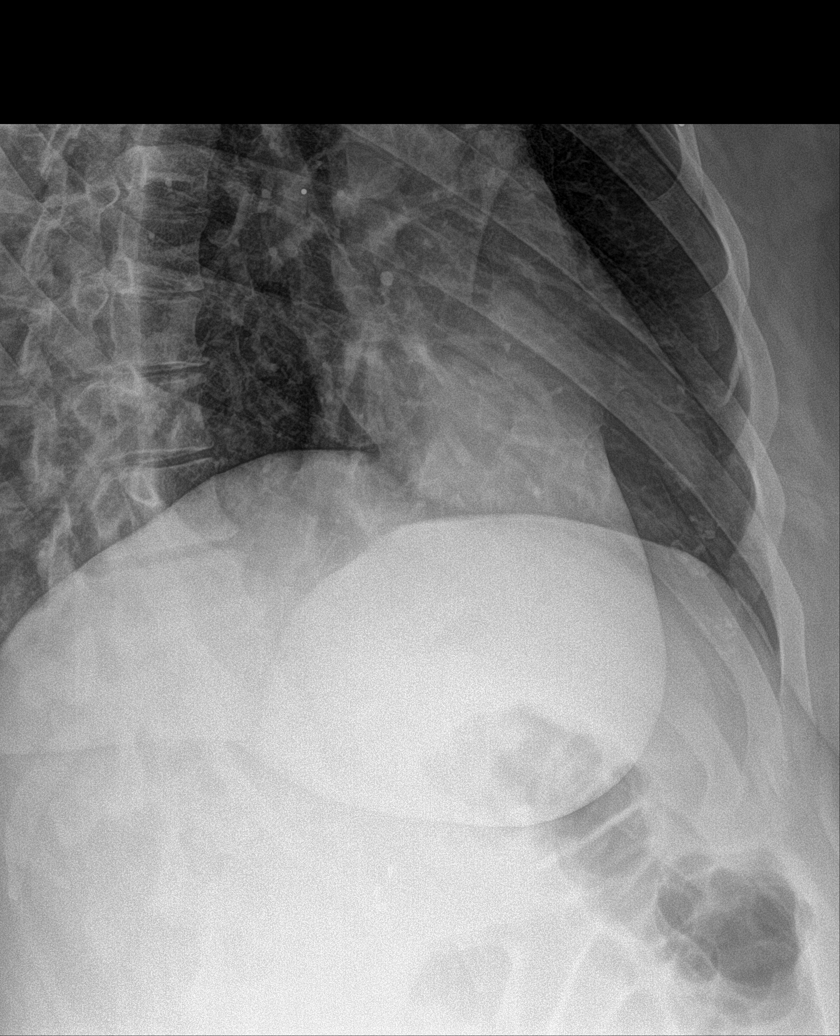

[5 of 5 positions shown; findings below may reference images not displayed]

FINDINGS: Mediastinum hilar structures normal. Lungs are clear. No pleural
effusion or pneumothorax. Heart size normal. No acute bony
abnormality identified. No evidence of displaced rib fracture or
focal bony abnormality.
IMPRESSION: No acute or focal abnormality.

## 2020-01-17 ENCOUNTER — Other Ambulatory Visit (INDEPENDENT_AMBULATORY_CARE_PROVIDER_SITE_OTHER): Payer: Self-pay | Admitting: Adult Health

## 2020-01-17 DIAGNOSIS — E559 Vitamin D deficiency, unspecified: Secondary | ICD-10-CM

## 2020-01-28 ENCOUNTER — Encounter (INDEPENDENT_AMBULATORY_CARE_PROVIDER_SITE_OTHER): Payer: Self-pay | Admitting: Adult Health

## 2020-01-28 ENCOUNTER — Ambulatory Visit (INDEPENDENT_AMBULATORY_CARE_PROVIDER_SITE_OTHER): Payer: PRIVATE HEALTH INSURANCE | Admitting: Adult Health

## 2020-01-28 ENCOUNTER — Other Ambulatory Visit: Payer: Self-pay

## 2020-01-28 VITALS — BP 116/76 | HR 67 | Temp 97.6°F | Ht 69.0 in | Wt 247.0 lb

## 2020-01-28 DIAGNOSIS — M791 Myalgia, unspecified site: Secondary | ICD-10-CM | POA: Insufficient documentation

## 2020-01-28 DIAGNOSIS — E8881 Metabolic syndrome: Secondary | ICD-10-CM

## 2020-01-28 DIAGNOSIS — E559 Vitamin D deficiency, unspecified: Secondary | ICD-10-CM | POA: Diagnosis not present

## 2020-01-28 DIAGNOSIS — R072 Precordial pain: Secondary | ICD-10-CM | POA: Insufficient documentation

## 2020-01-28 DIAGNOSIS — Z9189 Other specified personal risk factors, not elsewhere classified: Secondary | ICD-10-CM

## 2020-01-28 DIAGNOSIS — R079 Chest pain, unspecified: Secondary | ICD-10-CM | POA: Diagnosis not present

## 2020-01-28 DIAGNOSIS — Z6836 Body mass index (BMI) 36.0-36.9, adult: Secondary | ICD-10-CM

## 2020-01-28 DIAGNOSIS — E88819 Insulin resistance, unspecified: Secondary | ICD-10-CM

## 2020-01-28 MED ORDER — VITAMIN D (ERGOCALCIFEROL) 1.25 MG (50000 UNIT) PO CAPS: 50000.0000 [IU] | ORAL_CAPSULE | ORAL | 0 refills | Status: DC

## 2020-01-28 NOTE — Progress Notes (Signed)
Chief Complaint:   OBESITY Caitlin Barrett is here to discuss her progress with her obesity treatment plan along with follow-up of her obesity related diagnoses. [name] is on the Category 3 Plan and states she is following her eating plan approximately 80-85% of the time. [name] states she is exercising 0 minutes 0 times per week.  Today's visit was #: 9 Starting weight: 277 lbs Starting date: 08/13/2019 Today's weight: 247 lbs Today's date: 01/28/2020 Total lbs lost to date: 30 Total lbs lost since last in-office visit: 0  Interim History: Caitlin Barrett received an influenza vaccine on 01/25/2020 and has been feeling fatigued with body aches since then. She has been working long hours, up to 14-16 hours a day, and experienced muscle soreness with increased walking. She would like to incorporate [place] oatmeal into her breakfast routine (190 calories/12 grams protein).  Subjective:   Vitamin D deficiency. Vitamin D level on 12/15/2019 was 36.7, well below goal of 50. Caitlin Barrett is on Ergocalciferol. No nausea, vomiting, or muscle weakness.    Ref. Range 12/15/2019 08:33  Vitamin D, 25-Hydroxy Latest Ref Range: 30.0 - 100.0 ng/mL 36.7   Insulin resistance. Anae has a diagnosis of insulin resistance based on her elevated fasting insulin level >5. She continues to work on diet and exercise to decrease her risk of diabetes. 12/15/2019 blood glucose 94, A1c 5.5 with an insulin level of 13.5, improved from 19.0 on 08/13/2019.  Lab Results  Component Value Date   INSULIN 13.5 12/15/2019   INSULIN 19.0 08/13/2019   Lab Results  Component Value Date   HGBA1C 5.5 12/15/2019   Chest pain, unspecified type. Justice denies first degree family hx of MI, however does have first degree family hx of heart diease. Her mother/sister are on antihypertensives and statin.  Muscle soreness. Caitlin Barrett has experienced increased lower extremity muscle soreness, especially in her bilateral  hamstrings.  At risk for activity intolerance. Caitlin Barrett is at risk of exercise intolerance due to muscle soreness.  Assessment/Plan:   Vitamin D deficiency. Low Vitamin D level contributes to fatigue and are associated with obesity, breast, and colon cancer. She was given a refill on her Vitamin D, Ergocalciferol, (DRISDOL) 1.25 MG (50000 UNIT) CAPS capsule every week  #4 with 0 refills and will follow-up for routine testing of Vitamin D, at least 2-3 times per year to avoid over-replacement.   Insulin resistance. Caitlin Barrett will continue to work on weight loss, exercise, and decreasing simple carbohydrates to help decrease the risk of diabetes. Caitlin Barrett agreed to follow-up with [place] as directed to closely monitor her progress. She will continue to focus on protein at each meal and limit simple carbohydrate intake. Labs will be checked every 3 months.   Chest pain, unspecified type. We recommend follow-up with PCP to discuss symptoms and referral to Cardiology.  Muscle soreness. Caitlin Barrett was instructed to stretch daily, take OTC magnesium supplement (discuss best OTC supplement with pharmacist), and remain well hydrated with water.  At risk for activity intolerance. Caitlin Barrett was given approximately 15 minutes of exercise intolerance counseling today. She is 49 y.o. female and has risk factors exercise intolerance including obesity. We discussed intensive lifestyle modifications today with an emphasis on specific weight loss instructions and strategies. [name] will slowly increase activity as tolerated.  Repetitive spaced learning was employed today to elicit superior memory formation and behavioral change.  Class 2 severe obesity with serious comorbidity and body mass index (BMI) of 36.0 to 36.9 in adult, unspecified obesity type ([place]).  Caitlin Barrett is currently in the action stage of change. As such, her goal is to continue with weight loss efforts. She has agreed to the Category 3 Plan and will  journal 250-350 calories and 25 grams of protein at breakfast..   Exercise goals: Caitlin Barrett walks 3-6 miles a day when working.  Behavioral modification strategies: increasing lean protein intake, no skipping meals, meal planning and cooking strategies, planning for success and keeping a strict food journal.  Caitlin Barrett has agreed to follow-up with our clinic in 4 weeks. She was informed of the importance of frequent follow-up visits to maximize her success with intensive lifestyle modifications for her multiple health conditions.   Objective:   Blood pressure 116/76, pulse 67, temperature 97.6 F (36.4 C), height 5' 9"  (1.753 m), weight 247 lb (112 kg), last menstrual period 05/26/2016, SpO2 97 %. Body mass index is 36.48 kg/m.  General: Cooperative, alert, well developed, in no acute distress. HEENT: Conjunctivae and lids unremarkable. Cardiovascular: Regular rhythm.  Lungs: Normal work of breathing. Neurologic: No focal deficits.   Lab Results  Component Value Date   CREATININE 0.64 12/15/2019   BUN 17 12/15/2019   NA 138 12/15/2019   K 4.5 12/15/2019   CL 102 12/15/2019   CO2 24 12/15/2019   Lab Results  Component Value Date   ALT 15 12/15/2019   AST 8 12/15/2019   ALKPHOS 52 12/15/2019   BILITOT 0.2 12/15/2019   Lab Results  Component Value Date   HGBA1C 5.5 12/15/2019   HGBA1C 5.3 08/13/2019   HGBA1C 5.3 10/23/2017   HGBA1C 5.0 10/01/2016   Lab Results  Component Value Date   INSULIN 13.5 12/15/2019   INSULIN 19.0 08/13/2019   Lab Results  Component Value Date   TSH 0.081 (L) 08/13/2019   Lab Results  Component Value Date   CHOL 187 12/15/2019   HDL 33 (L) 12/15/2019   LDLCALC 128 (H) 12/15/2019   TRIG 145 12/15/2019   CHOLHDL 5.7 (H) 12/15/2019   Lab Results  Component Value Date   WBC 9.9 08/13/2019   HGB 13.3 08/13/2019   HCT 38.9 08/13/2019   MCV 91 08/13/2019   PLT 373 08/13/2019   Lab Results  Component Value Date   IRON 64 08/13/2019    TIBC 333 08/13/2019   FERRITIN 30 08/13/2019   Attestation Statements:   Reviewed by clinician on day of visit: allergies, medications, problem list, medical history, surgical history, family history, social history, and previous encounter notes.  I, Michaelene Song, am acting as Location manager for [employer], NP-C   I have reviewed the above documentation for accuracy and completeness, and I agree with the above. -  Shreyansh Tiffany d. Seward Coran, NP-C

## 2020-02-04 ENCOUNTER — Ambulatory Visit: Payer: Self-pay | Admitting: Family Medicine

## 2020-02-04 NOTE — Telephone Encounter (Signed)
Please see encounter from 01/29/20. Pt calling to schedule appt as advised, possible cardiology referral.   Pt reports CP does not occur daily "Sometimes not even weekly." No associated symptoms. States duration varies. When occurs, mid chest , may radiate to left side, "Not always." Does not have presently. Appt made with Dr. Parks Ranger for next available, 02/08/20. Advised ED if symptoms reoccur, worsening.  Pt verbalizes understanding.   Reason for Disposition  [1] Chest pain(s) lasting a few seconds AND [2] persists > 3 days  Answer Assessment - Initial Assessment Questions 1. LOCATION: "Where does it hurt?"       Mid chest 2. RADIATION: "Does the pain go anywhere else?" (e.g., into neck, jaw, arms, back)     At times to left side 3. ONSET: "When did the chest pain begin?" (Minutes, hours or days)      1 1/2 months ago 4. PATTERN "Does the pain come and go, or has it been constant since it started?"  "Does it get worse with exertion?"      No worsening, does not occur daily, "Not even weekly." 5. DURATION: "How long does it last" (e.g., seconds, minutes, hours)     Varies 6. SEVERITY: "How bad is the pain?"  (e.g., Scale 1-10; mild, moderate, or severe)    - MILD (1-3): doesn't interfere with normal activities     - MODERATE (4-7): interferes with normal activities or awakens from sleep    - SEVERE (8-10): excruciating pain, unable to do any normal activities       SHooting 7. CARDIAC RISK FACTORS: "Do you have any history of heart problems or risk factors for heart disease?" (e.g., angina, prior heart attack; diabetes, high blood pressure, high cholesterol, smoker, or strong family history of heart disease)     *No Answer* 8. PULMONARY RISK FACTORS: "Do you have any history of lung disease?"  (e.g., blood clots in lung, asthma, emphysema, birth control pills)     no 9. CAUSE: "What do you think is causing the chest pain?"     no 10. OTHER SYMPTOMS: "Do you have any other  symptoms?" (e.g., dizziness, nausea, vomiting, sweating, fever, difficulty breathing, cough)       no  Protocols used: CHEST PAIN-A-AH

## 2020-02-08 ENCOUNTER — Other Ambulatory Visit: Payer: Self-pay

## 2020-02-08 ENCOUNTER — Ambulatory Visit: Payer: PRIVATE HEALTH INSURANCE | Admitting: Family Medicine

## 2020-02-08 ENCOUNTER — Encounter: Payer: Self-pay | Admitting: Family Medicine

## 2020-02-08 VITALS — BP 130/50 | HR 79 | Temp 98.0°F | Resp 16 | Ht 69.0 in | Wt 250.0 lb

## 2020-02-08 DIAGNOSIS — R0789 Other chest pain: Secondary | ICD-10-CM | POA: Diagnosis not present

## 2020-02-08 DIAGNOSIS — F411 Generalized anxiety disorder: Secondary | ICD-10-CM

## 2020-02-08 DIAGNOSIS — F3342 Major depressive disorder, recurrent, in full remission: Secondary | ICD-10-CM

## 2020-02-08 NOTE — Progress Notes (Signed)
Subjective:    Patient ID: Caitlin Barrett, female    DOB: 12-26-1970, 49 y.o.   MRN: [id number]  Caitlin Barrett is a 49 y.o. female presenting on 02/08/2020 for Chest Pain (chest pressure -burning --intermitten --onset couple of month--went to [facility] in 11/2019 had EKG done)   HPI   Chest Pressure/Pain Initial visit 12/02/19 [facility], had elevated BP and anxiety, she had chest pain and she went to urgent care, had EKG was reassuring, thought it could be anxiety, heartburn, or costochondritis. She has taken Prilosec as well with some improvement for about 14 days for the burning sensation. - She admits overall improvement now 2 months later - She says now still has a constant mild pressure most of the time - She now in past 3 weeks she has had new problem with episodic sharp pains radiating from Left chest to left arm. She describes radiating sharp pains. Episodic, not related to activity or exertion. - She says no further GERD symptoms or heartburn with burning symptoms. She is off Prilosec. - She has had some elevated BP readings on oral decongestant pill. - Last lipid panel 11/2019 showed LDL 128, HDL 33.  She says her anxiety has been resolved and not affecting her right now.  Admits some episodic nausea. Not related to chest pain.  She is trying to improve exercise.  Maternal grandfather - had heart attack before age 55, and maternal uncle had MI age 18. Her parents do not have heart disease Family history of HTN, HLD. Twin sister has HTN.  Major Depression recurrent chronic in remission Anxiety Currently doing well, these problems are controlled or resolved. No mood or depression Not on medication Anxiety is not affecting her currently  Health Maintenance: UTD on Flu and COVID vaccine, Moderna vaccine need to know dates.  Depression screen [facility] 2/9 02/08/2020 08/13/2019 02/10/2019  Decreased Interest 0 3 1  Down, Depressed, Hopeless 0 3 1  PHQ - 2 Score 0 6 2    Altered sleeping 0 3 2  Tired, decreased energy 0 2 3  Change in appetite 0 3 2  Feeling bad or failure about yourself  0 2 1  Trouble concentrating 0 1 0  Moving slowly or fidgety/restless 0 2 2  Suicidal thoughts 0 1 0  PHQ-9 Score 0 20 12  Difficult doing work/chores Not difficult at all Somewhat difficult Somewhat difficult   GAD 7 : Generalized Anxiety Score 02/08/2020 02/10/2019 04/22/2018  Nervous, Anxious, on Edge 0 2 1  Control/stop worrying 0 2 1  Worry too much - different things 0 2 1  Trouble relaxing 0 2 1  Restless 0 1 0  Easily annoyed or irritable 0 0 1  Afraid - awful might happen 0 2 0  Total GAD 7 Score 0 11 5  Anxiety Difficulty Not difficult at all Somewhat difficult Not difficult at all     Social History   Tobacco Use  . Smoking status: Never Smoker  . Smokeless tobacco: Never Used  Vaping Use  . Vaping Use: Never used  Substance Use Topics  . Alcohol use: Not Currently    Comment: rare  . Drug use: No    Review of Systems Per HPI unless specifically indicated above     Objective:    BP (!) 130/50   Pulse 79   Temp 98 F (36.7 C)   Resp 16   Ht 5\' 9"  (1.753 m)   Wt 250 lb (113.4  kg)   LMP 05/26/2016 (Exact Date)   SpO2 98%   BMI 36.92 kg/m   Wt Readings from Last 3 Encounters:  02/08/20 250 lb (113.4 kg)  01/28/20 247 lb (112 kg)  01/06/20 247 lb (112 kg)    Physical Exam Vitals and nursing note reviewed.  Constitutional:      General: She is not in acute distress.    Appearance: She is well-developed. She is not diaphoretic.     Comments: Well-appearing, comfortable, cooperative  HENT:     Head: Normocephalic and atraumatic.  Eyes:     General:        Right eye: No discharge.        Left eye: No discharge.     Conjunctiva/sclera: Conjunctivae normal.  Neck:     Thyroid: No thyromegaly.  Cardiovascular:     Rate and Rhythm: Normal rate and regular rhythm.     Heart sounds: Normal heart sounds. No murmur heard.    Pulmonary:     Effort: Pulmonary effort is normal. No respiratory distress.     Breath sounds: Normal breath sounds. No wheezing or rales.  Musculoskeletal:        General: Normal range of motion.     Cervical back: Normal range of motion and neck supple.  Lymphadenopathy:     Cervical: No cervical adenopathy.  Skin:    General: Skin is warm and dry.     Findings: No erythema or rash.  Neurological:     Mental Status: She is alert and oriented to person, place, and time.  Psychiatric:        Behavior: Behavior normal.     Comments: Well groomed, good eye contact, normal speech and thoughts       Results for orders placed or performed in visit on 12/15/19  Comprehensive metabolic panel  Result Value Ref Range   Glucose 94 65 - 99 mg/dL   BUN 17 6 - 24 mg/dL   Creatinine, Ser 2.13 0.57 - 1.00 mg/dL   GFR calc non Af Amer 107 >59 mL/min/1.73   GFR calc Af Amer 123 >59 mL/min/1.73   BUN/Creatinine Ratio 27 (H) 9 - 23   Sodium 138 134 - 144 mmol/L   Potassium 4.5 3.5 - 5.2 mmol/L   Chloride 102 96 - 106 mmol/L   CO2 24 20 - 29 mmol/L   Calcium 9.0 8.7 - 10.2 mg/dL   Total Protein 6.7 6.0 - 8.5 g/dL   Albumin 4.3 3.8 - 4.8 g/dL   Globulin, Total 2.4 1.5 - 4.5 g/dL   Albumin/Globulin Ratio 1.8 1.2 - 2.2   Bilirubin Total 0.2 0.0 - 1.2 mg/dL   Alkaline Phosphatase 52 44 - 121 IU/L   AST 8 0 - 40 IU/L   ALT 15 0 - 32 IU/L  Hemoglobin A1c  Result Value Ref Range   Hgb A1c MFr Bld 5.5 4.8 - 5.6 %   Est. average glucose Bld gHb Est-mCnc 111 mg/dL  Insulin, random  Result Value Ref Range   INSULIN 13.5 2.6 - 24.9 uIU/mL  Lipid panel  Result Value Ref Range   Cholesterol, Total 187 100 - 199 mg/dL   Triglycerides 086 0 - 149 mg/dL   HDL 33 (L) >57 mg/dL   VLDL Cholesterol Cal 26 5 - 40 mg/dL   LDL Chol Calc (NIH) 846 (H) 0 - 99 mg/dL   Chol/HDL Ratio 5.7 (H) 0.0 - 4.4 ratio  VITAMIN D 25 Hydroxy (Vit-D Deficiency, Fractures)  Result Value Ref Range   Vit D, 25-Hydroxy  36.7 30.0 - 100.0 ng/mL      Assessment & Plan:   Problem List Items Addressed This Visit    Major depressive disorder, recurrent, in full remission (HCC)    Controlled in remission Not on medication      GAD (generalized anxiety disorder)    Other Visit Diagnoses    Atypical chest pain    -  Primary   Relevant Orders   Ambulatory referral to Cardiology        #atypical recurrent chest pain, left sided radiating, not with exertion, has history of GERD and anxiety but these are controlled now still has symptoms. She has strong fam history of heart disease with MI in maternal grandfather and uncle age 49-50. She has some elevated LDL cholesterol, obesity and risk factors. Requesting cardiac evaluation.  - Referral to [facility] - If ultimately problem is ruled out cardiac cause and has more GERD vs Anxiety component, she may return to our office for follow-up  No orders of the defined types were placed in this encounter.  Orders Placed This Encounter  Procedures  . Ambulatory referral to Cardiology    Referral Priority:   Routine    Referral Type:   Consultation    Referral Reason:   Specialty Services Required    Requested Specialty:   Cardiology    Number of Visits Requested:   1     Follow up plan: Return if symptoms worsen or fail to improve.   Saralyn Pilar, DO [facility] 02/08/2020, 3:23 PM

## 2020-02-08 NOTE — Patient Instructions (Addendum)
Thank you for coming to the office today.  [facility]) HeartCare at [place], [place] [address] Main: [phone]   Referral sent. Stay tuned.  Okay to gradually do mild exercise  Keep an eye on stomach acid etc - we may restart a medicine for this in the future.  If anxiety is worse let me know.   Please schedule a Follow-up Appointment to: Return if symptoms worsen or fail to improve.  If you have any other questions or concerns, please feel free to call the office or send a message through [place]. You may also schedule an earlier appointment if necessary.  Additionally, you may be receiving a survey about your experience at our office within a few days to 1 week by e-mail or mail. We value your feedback.  Nobie Putnam, DO [place]

## 2020-02-09 ENCOUNTER — Other Ambulatory Visit: Payer: Self-pay | Admitting: Nurse Practitioner

## 2020-02-09 DIAGNOSIS — R079 Chest pain, unspecified: Secondary | ICD-10-CM

## 2020-02-09 NOTE — Assessment & Plan Note (Signed)
Controlled in remission Not on medication

## 2020-02-09 NOTE — Telephone Encounter (Signed)
Requested medication (s) are due for refill today: Yes  Requested medication (s) are on the active medication list: Yes  Last refill:  12/09/2018  Future visit scheduled: No  Notes to clinic:  Unable to refill per protocol, expired Rx     Requested Prescriptions  Pending Prescriptions Disp Refills   albuterol (VENTOLIN HFA) 108 (90 Base) MCG/ACT inhaler [Pharmacy Med Name: ALBUTEROL HFA (VENTOLIN) INH] 18 each 2    Sig: TAKE 2 PUFFS BY MOUTH EVERY 6 HOURS AS NEEDED FOR WHEEZE OR SHORTNESS OF BREATH      Pulmonology:  Beta Agonists Failed - 02/09/2020 10:15 AM      Failed - One inhaler should last at least one month. If the patient is requesting refills earlier, contact the patient to check for uncontrolled symptoms.      Passed - Valid encounter within last 12 months    Recent Outpatient Visits           Yesterday Atypical chest pain   [place], DO   9 months ago [place], DO   12 months ago Fatigue, unspecified type   [place], DO   1 year ago Injury of right shoulder, initial encounter   [place], DO   1 year ago Pneumonia of left lower lobe due to infectious organism [facility])   [facility], Devonne Doughty, DO       Future Appointments             In 4 months Bo Merino, MD [place]   In 4 months Rubie Maid, MD [facility]

## 2020-02-12 ENCOUNTER — Encounter: Payer: Self-pay | Admitting: Cardiology

## 2020-02-12 ENCOUNTER — Other Ambulatory Visit: Payer: Self-pay

## 2020-02-12 ENCOUNTER — Ambulatory Visit: Payer: PRIVATE HEALTH INSURANCE | Admitting: Cardiology

## 2020-02-12 VITALS — BP 130/72 | HR 67 | Ht 69.0 in | Wt 249.4 lb

## 2020-02-12 DIAGNOSIS — R072 Precordial pain: Secondary | ICD-10-CM

## 2020-02-12 DIAGNOSIS — E7800 Pure hypercholesterolemia, unspecified: Secondary | ICD-10-CM | POA: Diagnosis not present

## 2020-02-12 DIAGNOSIS — R0600 Dyspnea, unspecified: Secondary | ICD-10-CM

## 2020-02-12 DIAGNOSIS — R0609 Other forms of dyspnea: Secondary | ICD-10-CM

## 2020-02-12 MED ORDER — PREDNISONE 50 MG PO TABS: ORAL_TABLET | ORAL | 0 refills | Status: DC

## 2020-02-12 MED ORDER — PREDNISONE 50 MG PO TABS
ORAL_TABLET | ORAL | 0 refills | Status: DC
Start: 2020-02-12 — End: 2020-03-24

## 2020-02-12 MED ORDER — METOPROLOL TARTRATE 100 MG PO TABS: 100.0000 mg | ORAL_TABLET | Freq: Once | ORAL | 0 refills | Status: DC

## 2020-02-12 NOTE — Patient Instructions (Signed)
Medication Instructions:  Your physician has recommended you make the following change in your medication:   **See CTA testing procedure instructions.  Pick up the Prednisone and Lopressor from your pharmacy ([facility]), and Benadryl over the counter.   Lab Work: None Ordered If you have labs (blood work) drawn today and your tests are completely normal, you will receive your results only by:  [place] (if you have MyChart) OR  A paper copy in the mail If you have any lab test that is abnormal or we need to change your treatment, we will call you to review the results.   Testing/Procedures:  1.  Your physician has requested that you have an echocardiogram. Echocardiography is a painless test that uses sound waves to create images of your heart. It provides your doctor with information about the size and shape of your heart and how well your hearts chambers and valves are working. This procedure takes approximately one hour. There are no restrictions for this procedure.  2.  Your physician has requested that you have cardiac CT. Cardiac computed tomography (CT) is a painless test that uses an x-ray machine to take clear, detailed pictures of your heart. For further information please visit [email or web address]. Please follow instruction sheet as given.   Your cardiac CT will be scheduled at one of the below locations:   [facility] [address] [place], [place] [address] [phone]   If scheduled at [facility], please arrive at the [facility] main entrance of [facility] 30 minutes prior to test start time. Proceed to the [facility] Radiology Department (first floor) to check-in and test prep.   Please follow these instructions carefully (unless otherwise directed):    On the Night Before the Test:  Be sure to Drink plenty of water.  Do not consume any caffeinated/decaffeinated beverages or chocolate 12 hours prior to your  test.  Do not take any antihistamines 12 hours prior to your test.  If the patient has contrast allergy: ? Patient will need a prescription for Prednisone and very clear instructions (as follows): 1. Prednisone 50 mg - take 13 hours prior to test 2. Take another Prednisone 50 mg 7 hours prior to test 3. Take another Prednisone 50 mg 1 hour prior to test 4. Take Benadryl 50 mg 1 hour prior to test  Patient must complete all four doses of above prophylactic medications.  Patient will need a ride after test due to Benadryl.  On the Day of the Test:  Drink plenty of water. Do not drink any water within one hour of the test.  Do not eat any food 4 hours prior to the test.  You may take your regular medications prior to the test.   Take metoprolol (Lopressor) two hours prior to test.  FEMALES- please wear underwire-free bra if available        After the Test:  Drink plenty of water.  After receiving IV contrast, you may experience a mild flushed feeling. This is normal.  On occasion, you may experience a mild rash up to 24 hours after the test. This is not dangerous. If this occurs, you can take Benadryl 25 mg and increase your fluid intake.  If you experience trouble breathing, this can be serious. If it is severe call 911 IMMEDIATELY. If it is mild, please call our office.    Once we have confirmed authorization from your insurance company, we will call you to set up a date  and time for your test. Based on how quickly your insurance processes prior authorizations requests, please allow up to 4 weeks to be contacted for scheduling your Cardiac CT appointment. Be advised that routine Cardiac CT appointments could be scheduled as many as 8 weeks after your provider has ordered it.  For non-scheduling related questions, please contact the cardiac imaging nurse navigator should you have any questions/concerns: Marchia Bond, Cardiac Imaging Nurse Navigator Burley Saver, Interim  Cardiac Imaging Nurse [place] [facility] Direct Office Dial: [phone]   For scheduling needs, including cancellations and rescheduling, please call [place], [phone] (temporary number).     Follow-Up: At [facility], you and your health needs are our priority.  As part of our continuing mission to provide you with exceptional heart care, we have created designated Provider Care Teams.  These Care Teams include your primary Cardiologist (physician) and Advanced Practice Providers (APPs -  Physician Assistants and Nurse Practitioners) who all work together to provide you with the care you need, when you need it.  We recommend signing up for the patient portal called "MyChart".  Sign up information is provided on this After Visit Summary.  MyChart is used to connect with patients for Virtual Visits (Telemedicine).  Patients are able to view lab/test results, encounter notes, upcoming appointments, etc.  Non-urgent messages can be sent to your provider as well.   To learn more about what you can do with MyChart, go to [email or web address].    Your next appointment:   Follow up after Echo and CTA   The format for your next appointment:   In Person  Provider:   Kate Sable, MD   Other Instructions

## 2020-02-12 NOTE — Progress Notes (Signed)
Cardiology Office Note:    Date:  02/12/2020   ID:  Caitlin Barrett, DOB 1970/08/12, MRN [id number]  PCP:  Smitty Cords, DO  [facility] Cardiologist:  Debbe Odea, MD  [facility] Electrophysiologist:  None   Referring MD: Saralyn Pilar *   Chief Complaint  Patient presents with  . New Patient (Initial Visit)    Ref by Dr. Althea Charon for chest tightness. Meds reviewed by the pt. verbally. Pt. c/o chest tightness with sharp pains that radiate up into her shoulder blades with occas. feeling nauseated.    Caitlin Barrett is a 48 y.o. female who is being seen today for the evaluation of chest pain at the request of Saralyn Pilar *.   History of Present Illness:    Caitlin Barrett is a 49 y.o. female with a hx of anxiety, GERD, hyperlipidemia who presents due to chest pain.  She states having symptoms of chest pain ongoing over the past 2 months.  She describes pain as pressure located at the sternum sometimes radiating to her left shoulder.  Rates pressure as 6 out of 10 in severity when it occurs..  Took PPI with some improvement in symptoms but symptoms still persist.  Also endorses shortness of breath with exertion typically with walking, going up stairs.  Denies any history of smoking.  Past Medical History:  Diagnosis Date  . Allergy    seasonal.  Takes allergy shots at [facility]  . Anemia   . Anxiety   . Arthritis   . Asthma    exercise induced--no inhalers  . Back pain   . Cancer (HCC) 2010   Basal cell skin cancer Lt eye, skin graft above Lt eye  . Constipation   . Costochondritis   . Depression   . Dysmenorrhea   . Eczema   . Edema, lower extremity   . GERD (gastroesophageal reflux disease)   . Headache   . Hypothyroid    Nodules and Fine needle aspiration  . IBS (irritable bowel syndrome)   . Lab test positive for detection of COVID-19 virus    November 2020  . Myalgia   . Osteoarthritis   . Painful menstrual  periods   . Palpitations   . Plantar fasciitis   . Shortness of breath   . Sprain of carpal (joint) of wrist   . Vitamin B 12 deficiency   . Vitamin D deficiency     Past Surgical History:  Procedure Laterality Date  . ABDOMINAL HYSTERECTOMY Bilateral 07/02/2016   Procedure: HYSTERECTOMY ABDOMINAL WITH BILATERAL SALPINGECTOMY-MIDLINE INCISION;  Surgeon: Herold Harms, MD;  Location: [facility] ORS;  Service: Gynecology;  Laterality: Bilateral;  . CHOLECYSTECTOMY  2009  . HERNIA REPAIR  1974   umbilical  . SKIN GRAFT  2010  . TONSILLECTOMY      Current Medications: Current Meds  Medication Sig  . albuterol (VENTOLIN HFA) 108 (90 Base) MCG/ACT inhaler TAKE 2 PUFFS BY MOUTH EVERY 6 HOURS AS NEEDED FOR WHEEZE OR SHORTNESS OF BREATH  . baclofen (LIORESAL) 10 MG tablet TAKE 1/2 TO 1 TABLET BY MOUTH AT BEDTIME AS NEEDED FOR MUSCLE SPASM  . clobetasol cream (TEMOVATE) 0.05 % Apply 1 application topically 2 (two) times daily. For up to 1-2 weeks then as needed. Do not use on sensitive skin.  . COLLAGEN-BORON-HYALURONIC ACID PO Take by mouth.  . cyclobenzaprine (FLEXERIL) 10 MG tablet Take 1 tablet (10 mg total) by mouth 2 (two) times daily as needed for muscle  spasms.  . diphenhydrAMINE (BENADRYL ALLERGY) 25 mg capsule Take 25 mg by mouth every 6 (six) hours as needed.  Marland Kitchen EPINEPHrine 0.3 mg/0.3 mL IJ SOAJ injection 0.3 mg as needed.   Marland Kitchen estradiol (ESTRACE) 1 MG tablet Take 1 tablet (1 mg total) by mouth daily.  Marland Kitchen ibuprofen (ADVIL) 600 MG tablet Take 1 tablet (600 mg total) by mouth every 6 (six) hours as needed.  . Loratadine 10 MG CAPS Take by mouth.  . Melatonin 3 MG TABS Take by mouth at bedtime.   . Misc Natural Products (OSTEO BI-FLEX ADV TRIPLE ST PO) Take by mouth.  . Multiple Vitamins-Minerals (MULTIVITAMIN WOMEN PO) Take by mouth.  . ondansetron (ZOFRAN) 4 MG tablet Take by mouth as needed.   Marland Kitchen oxymetazoline (AFRIN) 0.05 % nasal spray Place 1 spray into both nostrils as needed.    . Probiotic Product (TRUBIOTICS) CAPS Take 1 capsule by mouth daily.  . progesterone (PROMETRIUM) 200 MG capsule Take 1 capsule (200 mg total) by mouth daily.  Marland Kitchen triamcinolone cream (KENALOG) 0.1 % APPLY THIN FILM TWICE DAILY TO ECZEMA UNTIL CLEAR  . TURMERIC PO Take by mouth daily.  . Vitamin D, Ergocalciferol, (DRISDOL) 1.25 MG (50000 UNIT) CAPS capsule Take 1 capsule (50,000 Units total) by mouth every 7 (seven) days.     Allergies:   Contrast media [iodinated diagnostic agents], Codeine, and Other   Social History   Socioeconomic History  . Marital status: Married    Spouse name: Not on file  . Number of children: Not on file  . Years of education: Not on file  . Highest education level: Not on file  Occupational History  . Occupation: Psychologist, prison and probation services  Tobacco Use  . Smoking status: Never Smoker  . Smokeless tobacco: Never Used  Vaping Use  . Vaping Use: Never used  Substance and Sexual Activity  . Alcohol use: Not Currently    Comment: rare  . Drug use: No  . Sexual activity: Yes    Birth control/protection: Surgical  Other Topics Concern  . Not on file  Social History Narrative  . Not on file   Social Determinants of Health   Financial Resource Strain:   . Difficulty of Paying Living Expenses: Not on file  Food Insecurity:   . Worried About Programme researcher, broadcasting/film/video in the Last Year: Not on file  . Ran Out of Food in the Last Year: Not on file  Transportation Needs:   . Lack of Transportation (Medical): Not on file  . Lack of Transportation (Non-Medical): Not on file  Physical Activity:   . Days of Exercise per Week: Not on file  . Minutes of Exercise per Session: Not on file  Stress:   . Feeling of Stress : Not on file  Social Connections:   . Frequency of Communication with Friends and Family: Not on file  . Frequency of Social Gatherings with Friends and Family: Not on file  . Attends Religious Services: Not on file  . Active Member of Clubs or  Organizations: Not on file  . Attends Banker Meetings: Not on file  . Marital Status: Not on file     Family History: The patient's family history includes Cancer in her father; Diabetes in her maternal grandfather and maternal grandmother; Heart attack in her maternal grandfather; Heart disease in her maternal grandfather and maternal grandmother; Hyperlipidemia in her mother; Hypertension in her maternal grandmother and mother; Obesity in her mother; Sleep apnea in  her mother; Stroke in her maternal grandmother.  ROS:   Please see the history of present illness.     All other systems reviewed and are negative.  EKGs/Labs/Other Studies Reviewed:    The following studies were reviewed today:   EKG:  EKG is  ordered today.  The ekg ordered today demonstrates normal sinus rhythm, normal EKG  Recent Labs: 08/13/2019: Hemoglobin 13.3; Platelets 373; TSH 0.081 12/15/2019: ALT 15; BUN 17; Creatinine, Ser 0.64; Potassium 4.5; Sodium 138  Recent Lipid Panel    Component Value Date/Time   CHOL 187 12/15/2019 0833   TRIG 145 12/15/2019 0833   HDL 33 (L) 12/15/2019 0833   CHOLHDL 5.7 (H) 12/15/2019 0833   CHOLHDL 4.8 10/23/2017 0830   VLDL 19 10/01/2016 0835   LDLCALC 128 (H) 12/15/2019 0833   LDLCALC 128 (H) 10/23/2017 0830     Risk Assessment/Calculations:      Physical Exam:    VS:  BP 130/72 (BP Location: Right Arm, Patient Position: Sitting, Cuff Size: Large)   Pulse 67   Ht 5\' 9"  (1.753 m)   Wt 249 lb 6 oz (113.1 kg)   LMP 05/26/2016 (Exact Date)   SpO2 98%   BMI 36.83 kg/m     Wt Readings from Last 3 Encounters:  02/12/20 249 lb 6 oz (113.1 kg)  02/08/20 250 lb (113.4 kg)  01/28/20 247 lb (112 kg)     GEN:  Well nourished, well developed in no acute distress HEENT: Normal NECK: No JVD; No carotid bruits LYMPHATICS: No lymphadenopathy CARDIAC: RRR, no murmurs, rubs, gallops RESPIRATORY:  Clear to auscultation without rales, wheezing or rhonchi   ABDOMEN: Soft, non-tender, non-distended MUSCULOSKELETAL:  No edema; No deformity  SKIN: Warm and dry NEUROLOGIC:  Alert and oriented x 3 PSYCHIATRIC:  Normal affect   ASSESSMENT:    1. Precordial pain   2. Dyspnea on exertion   3. Pure hypercholesterolemia    PLAN:    In order of problems listed above:  1. Patient with chest pain, risk factors of hyperlipidemia.  Plan for echocardiogram and coronary CTA to evaluate presence of CAD. 2. Dyspnea on exertion, echocardiogram as above.  If above testing is normal, symptoms could be attributed to deconditioning. 3. Hyperlipidemia, 10-year ASCVD risk 1.9%.  Not in statin benefit group.  Low-cholesterol diet advised.  Follow-up after echo and coronary CTA.  Shared Decision Making/Informed Consent       Medication Adjustments/Labs and Tests Ordered: Current medicines are reviewed at length with the patient today.  Concerns regarding medicines are outlined above.  Orders Placed This Encounter  Procedures  . CT CORONARY MORPH W/CTA COR W/SCORE W/CA W/CM &/OR WO/CM  . CT CORONARY FRACTIONAL FLOW RESERVE DATA PREP  . CT CORONARY FRACTIONAL FLOW RESERVE FLUID ANALYSIS  . EKG 12-Lead  . ECHOCARDIOGRAM COMPLETE   Meds ordered this encounter  Medications  . metoprolol tartrate (LOPRESSOR) 100 MG tablet    Sig: Take 1 tablet (100 mg total) by mouth once for 1 dose. Take 2 hours prior to your CT scan.    Dispense:  1 tablet    Refill:  0  . DISCONTD: predniSONE (DELTASONE) 50 MG tablet    Sig: Take 1 tablet by mouth 13 hours prior to test.  Take 1 tablet by mouth  7 hours prior to test. Take 1 tablet by mouth  1 hour prior to test.    Dispense:  3 tablet    Refill:  0  . predniSONE (DELTASONE)  50 MG tablet    Sig: Take 1 tablet by mouth 13 hours prior to test. Take 1 tablet by mouth  7 hours prior to test. Take 1 tablet by mouth  1 hour prior to test.    Dispense:  3 tablet    Refill:  0    Patient Instructions  Medication  Instructions:  Your physician has recommended you make the following change in your medication:   **See CTA testing procedure instructions.  Pick up the Prednisone and Lopressor from your pharmacy ([facility]), and Benadryl over the counter.   Lab Work: None Ordered If you have labs (blood work) drawn today and your tests are completely normal, you will receive your results only by: [place] MyChart Message (if you have MyChart) OR . A paper copy in the mail If you have any lab test that is abnormal or we need to change your treatment, we will call you to review the results.   Testing/Procedures:  1.  Your physician has requested that you have an echocardiogram. Echocardiography is a painless test that uses sound waves to create images of your heart. It provides your doctor with information about the size and shape of your heart and how well your heart's chambers and valves are working. This procedure takes approximately one hour. There are no restrictions for this procedure.  2.  Your physician has requested that you have cardiac CT. Cardiac computed tomography (CT) is a painless test that uses an x-ray machine to take clear, detailed pictures of your heart. For further information please visit [email or web address] Please follow instruction sheet as given.   Your cardiac CT will be scheduled at one of the below locations:   [facility] [address] [place], [place] [address] [phone]   If scheduled at [facility], please arrive at the [facility] main entrance of [facility] 30 minutes prior to test start time. Proceed to the [facility] Radiology Department (first floor) to check-in and test prep.   Please follow these instructions carefully (unless otherwise directed):    On the Night Before the Test: . Be sure to Drink plenty of water. . Do not consume any caffeinated/decaffeinated beverages or chocolate 12 hours prior to your test. . Do not  take any antihistamines 12 hours prior to your test. . If the patient has contrast allergy: ? Patient will need a prescription for Prednisone and very clear instructions (as follows): 1. Prednisone 50 mg - take 13 hours prior to test 2. Take another Prednisone 50 mg 7 hours prior to test 3. Take another Prednisone 50 mg 1 hour prior to test 4. Take Benadryl 50 mg 1 hour prior to test . Patient must complete all four doses of above prophylactic medications. . Patient will need a ride after test due to Benadryl.  On the Day of the Test: . Drink plenty of water. Do not drink any water within one hour of the test. . Do not eat any food 4 hours prior to the test. . You may take your regular medications prior to the test.  . Take metoprolol (Lopressor) two hours prior to test. . FEMALES- please wear underwire-free bra if available        After the Test: . Drink plenty of water. . After receiving IV contrast, you may experience a mild flushed feeling. This is normal. . On occasion, you may experience a mild rash up to 24 hours after the test. This is not dangerous. If  this occurs, you can take Benadryl 25 mg and increase your fluid intake. . If you experience trouble breathing, this can be serious. If it is severe call 911 IMMEDIATELY. If it is mild, please call our office.    Once we have confirmed authorization from your insurance company, we will call you to set up a date and time for your test. Based on how quickly your insurance processes prior authorizations requests, please allow up to 4 weeks to be contacted for scheduling your Cardiac CT appointment. Be advised that routine Cardiac CT appointments could be scheduled as many as 8 weeks after your provider has ordered it.  For non-scheduling related questions, please contact the cardiac imaging nurse navigator should you have any questions/concerns: Rockwell Alexandria, Cardiac Imaging Nurse Navigator Mitzi Hansen, Interim Cardiac Imaging Nurse  Navigator [facility] Direct Office Dial: [phone]   For scheduling needs, including cancellations and rescheduling, please call [place], [phone] (temporary number).     Follow-Up: At [facility], you and your health needs are our priority.  As part of our continuing mission to provide you with exceptional heart care, we have created designated Provider Care Teams.  These Care Teams include your primary Cardiologist (physician) and Advanced Practice Providers (APPs -  Physician Assistants and Nurse Practitioners) who all work together to provide you with the care you need, when you need it.  We recommend signing up for the patient portal called "MyChart".  Sign up information is provided on this After Visit Summary.  MyChart is used to connect with patients for Virtual Visits (Telemedicine).  Patients are able to view lab/test results, encounter notes, upcoming appointments, etc.  Non-urgent messages can be sent to your provider as well.   To learn more about what you can do with MyChart, go to [email or web address].    Your next appointment:   Follow up after Echo and CTA   The format for your next appointment:   In Person  Provider:   Debbe Odea, MD   Other Instructions      Signed, Debbe Odea, MD  02/12/2020 12:50 PM    [facility]

## 2020-02-25 ENCOUNTER — Other Ambulatory Visit: Payer: Self-pay

## 2020-02-25 ENCOUNTER — Ambulatory Visit (INDEPENDENT_AMBULATORY_CARE_PROVIDER_SITE_OTHER): Payer: PRIVATE HEALTH INSURANCE | Admitting: Adult Health

## 2020-02-25 ENCOUNTER — Encounter (INDEPENDENT_AMBULATORY_CARE_PROVIDER_SITE_OTHER): Payer: Self-pay | Admitting: Adult Health

## 2020-02-25 VITALS — BP 146/85 | HR 78 | Temp 97.5°F | Ht 69.0 in | Wt 239.0 lb

## 2020-02-25 DIAGNOSIS — R072 Precordial pain: Secondary | ICD-10-CM

## 2020-02-25 DIAGNOSIS — Z9189 Other specified personal risk factors, not elsewhere classified: Secondary | ICD-10-CM | POA: Diagnosis not present

## 2020-02-25 DIAGNOSIS — Z6835 Body mass index (BMI) 35.0-35.9, adult: Secondary | ICD-10-CM

## 2020-02-25 DIAGNOSIS — E559 Vitamin D deficiency, unspecified: Secondary | ICD-10-CM | POA: Diagnosis not present

## 2020-02-25 DIAGNOSIS — E8881 Metabolic syndrome: Secondary | ICD-10-CM | POA: Diagnosis not present

## 2020-02-25 MED ORDER — VITAMIN D (ERGOCALCIFEROL) 1.25 MG (50000 UNIT) PO CAPS: 50000.0000 [IU] | ORAL_CAPSULE | ORAL | 0 refills | Status: DC

## 2020-02-25 NOTE — Progress Notes (Signed)
Chief Complaint:   OBESITY Caitlin Barrett is here to discuss her progress with her obesity treatment plan along with follow-up of her obesity related diagnoses. Caitlin Barrett is on the Category 3 Plan and states she is following her eating plan approximately 75% of the time. Caitlin Barrett states she is doing cardio 20 minutes 2-3 times per week.  Today's visit was #: 10 Starting weight: 277 lbs Starting date: 08/13/2019 Today's weight: 239 lbs Today's date: 02/25/2020 Total lbs lost to date: 38 Total lbs lost since last in-office visit: 8  Interim History: Caitlin Barrett is tracking daily calorie intake, which is approximately 1600 calories a day and using Cat 3 meal plan foods as a guide. She is also tracking calorie burn per day with [employer], however, is unable to articulate calories burned per day. Her 47 year old silver beagle, "[name]," has been acutely ill the last few days, which has been stressful.  She is quite concerned about her prognosis.  Subjective:   Vitamin D deficiency. Vitamin D level on 12/15/2019 was 36.7, below goal of 50. Caitlin Barrett is on Ergocalciferol. No nausea, vomiting, or muscle weakness.    Ref. Range 12/15/2019 08:33  Vitamin D, 25-Hydroxy Latest Ref Range: 30.0 - 100.0 ng/mL 36.7   Insulin resistance. Caitlin Barrett has a diagnosis of insulin resistance based on her elevated fasting insulin level >5. She continues to work on diet and exercise to decrease her risk of diabetes. Insulin level on 12/15/2019 was improving with normal blood glucose. Caitlin Barrett is not on metformin.  Lab Results  Component Value Date   INSULIN 13.5 12/15/2019   INSULIN 19.0 08/13/2019   Lab Results  Component Value Date   HGBA1C 5.5 12/15/2019   Pericardial pain. Cardiology ordered an Echo and coronary CTA. Caitlin Barrett has started PPI- which has helped reduce sx's.  At risk for depression. Caitlin Barrett is at elevated risk of depression due to acutely sick dog, reduced work schedule, and spending as much  time with "[name]" as possible.  Assessment/Plan:   Vitamin D deficiency. Low Vitamin D level contributes to fatigue and are associated with obesity, breast, and colon cancer. She was given a refill on her Vitamin D, Ergocalciferol, (DRISDOL) 1.25 MG (50000 UNIT) CAPS capsule IU every week #4 with 0 refills and will follow-up for routine testing of Vitamin D, at least 2-3 times per year to avoid over-replacement.   Insulin resistance. Caitlin Barrett will continue to work on weight loss, exercise, and decreasing simple carbohydrates to help decrease the risk of diabetes. Caitlin Barrett agreed to follow-up with [place] as directed to closely monitor her progress. Labs will be checked at her next office visit.  Pericardial pain. Caitlin Barrett will continue her PPI as directed and follow-up with Cardiology as scheduled.  At risk for depression. Caitlin Barrett was given approximately 15 minutes of depression risk counseling today. She has risk factors for depression including acutely sick dog and reduced work schedule. We discussed the importance of a healthy work life balance, a healthy relationship with food and a good support system.  Repetitive spaced learning was employed today to elicit superior memory formation and behavioral change.  Class 2 severe obesity with serious comorbidity and body mass index (BMI) of 35.0 to 35.9 in adult, unspecified obesity type (Caitlin Barrett).  Caitlin Barrett is currently in the action stage of change. As such, her goal is to continue with weight loss efforts. She has agreed to the Category 3 Plan. She will track her intake.  Handout was provided on Recipes.  Exercise goals: Cardio/frequent  walking 6-7 days per week.  Behavioral modification strategies: increasing lean protein intake, meal planning and cooking strategies and planning for success.  Caitlin Barrett has agreed to follow-up with our clinic fasting in 2 weeks. She was informed of the importance of frequent follow-up visits to maximize her success  with intensive lifestyle modifications for her multiple health conditions.   Objective:   Blood pressure (!) 146/85, pulse 78, temperature (!) 97.5 F (36.4 C), height 5' 9"  (1.753 m), weight 239 lb (108.4 kg), last menstrual period 05/26/2016, SpO2 98 %. Body mass index is 35.29 kg/m.  General: Cooperative, alert, well developed, in no acute distress. HEENT: Conjunctivae and lids unremarkable. Cardiovascular: Regular rhythm.  Lungs: Normal work of breathing. Neurologic: No focal deficits.   Lab Results  Component Value Date   CREATININE 0.64 12/15/2019   BUN 17 12/15/2019   NA 138 12/15/2019   K 4.5 12/15/2019   CL 102 12/15/2019   CO2 24 12/15/2019   Lab Results  Component Value Date   ALT 15 12/15/2019   AST 8 12/15/2019   ALKPHOS 52 12/15/2019   BILITOT 0.2 12/15/2019   Lab Results  Component Value Date   HGBA1C 5.5 12/15/2019   HGBA1C 5.3 08/13/2019   HGBA1C 5.3 10/23/2017   HGBA1C 5.0 10/01/2016   Lab Results  Component Value Date   INSULIN 13.5 12/15/2019   INSULIN 19.0 08/13/2019   Lab Results  Component Value Date   TSH 0.081 (L) 08/13/2019   Lab Results  Component Value Date   CHOL 187 12/15/2019   HDL 33 (L) 12/15/2019   LDLCALC 128 (H) 12/15/2019   TRIG 145 12/15/2019   CHOLHDL 5.7 (H) 12/15/2019   Lab Results  Component Value Date   WBC 9.9 08/13/2019   HGB 13.3 08/13/2019   HCT 38.9 08/13/2019   MCV 91 08/13/2019   PLT 373 08/13/2019   Lab Results  Component Value Date   IRON 64 08/13/2019   TIBC 333 08/13/2019   FERRITIN 30 08/13/2019   Attestation Statements:   Reviewed by clinician on day of visit: allergies, medications, problem list, medical history, surgical history, family history, social history, and previous encounter notes.  I, Michaelene Song, am acting as Location manager for [employer], NP-C   I have reviewed the above documentation for accuracy and completeness, and I agree with the above. -  Kayann [address] Scharnhorst, NP-C

## 2020-03-09 ENCOUNTER — Ambulatory Visit (INDEPENDENT_AMBULATORY_CARE_PROVIDER_SITE_OTHER): Payer: PRIVATE HEALTH INSURANCE

## 2020-03-09 ENCOUNTER — Other Ambulatory Visit: Payer: Self-pay

## 2020-03-09 DIAGNOSIS — R072 Precordial pain: Secondary | ICD-10-CM | POA: Diagnosis not present

## 2020-03-09 LAB — ECHOCARDIOGRAM COMPLETE
AR max vel: 2.93 cm2
AV Area VTI: 2.62 cm2
AV Area mean vel: 2.66 cm2
AV Mean grad: 5 mmHg
AV Peak grad: 8.8 mmHg
Ao pk vel: 1.48 m/s
Area-P 1/2: 5.13 cm2
S' Lateral: 3.2 cm
Single Plane A4C EF: 61.8 %

## 2020-03-09 MED ORDER — PERFLUTREN LIPID MICROSPHERE
1.0000 mL | INTRAVENOUS | Status: AC | PRN
  Administered 2020-03-09: 2 mL via INTRAVENOUS

## 2020-03-15 ENCOUNTER — Telehealth: Payer: Self-pay

## 2020-03-15 DIAGNOSIS — R072 Precordial pain: Secondary | ICD-10-CM

## 2020-03-15 NOTE — Telephone Encounter (Signed)
Called and spoke with patient and reviewed her Echo results as requested through her [place].  I also ordered a Myoview as requested by Dr. Garen Lah, as her CTA was denied by insurance. We reviewed instructions and I sent her a copy through her Mychart. Patient verbalized understanding and agreed with plan.

## 2020-03-23 ENCOUNTER — Other Ambulatory Visit: Payer: Self-pay

## 2020-03-23 ENCOUNTER — Encounter
Admission: RE | Admit: 2020-03-23 | Discharge: 2020-03-23 | Disposition: A | Discharge status: Home / Self Care | Payer: PRIVATE HEALTH INSURANCE | Source: Ambulatory Visit | Attending: Cardiology | Admitting: Cardiology

## 2020-03-23 DIAGNOSIS — R072 Precordial pain: Secondary | ICD-10-CM | POA: Insufficient documentation

## 2020-03-23 LAB — NM MYOCAR MULTI W/SPECT W/WALL MOTION / EF
LV dias vol: 137 mL (ref 46–106)
LV sys vol: 40 mL
Peak HR: 95 {beats}/min
Percent HR: 54 %
Rest HR: 54 {beats}/min
SDS: 0
SRS: 9
SSS: 5
TID: 0.94

## 2020-03-23 MED ORDER — REGADENOSON 0.4 MG/5ML IV SOLN
0.4000 mg | Freq: Once | INTRAVENOUS | Status: AC
  Administered 2020-03-23: 0.4 mg via INTRAVENOUS

## 2020-03-23 MED ORDER — TECHNETIUM TC 99M TETROFOSMIN IV KIT
30.0000 | PACK | Freq: Once | INTRAVENOUS | Status: AC | PRN
  Administered 2020-03-23: 31.98 via INTRAVENOUS

## 2020-03-23 MED ORDER — TECHNETIUM TC 99M TETROFOSMIN IV KIT
10.0000 | PACK | Freq: Once | INTRAVENOUS | Status: AC | PRN
  Administered 2020-03-23: 10.075 via INTRAVENOUS

## 2020-03-24 ENCOUNTER — Encounter: Payer: Self-pay | Admitting: Cardiology

## 2020-03-24 ENCOUNTER — Ambulatory Visit: Payer: PRIVATE HEALTH INSURANCE | Admitting: Cardiology

## 2020-03-24 VITALS — BP 110/78 | HR 62 | Ht 69.0 in | Wt 247.5 lb

## 2020-03-24 DIAGNOSIS — R0609 Other forms of dyspnea: Secondary | ICD-10-CM

## 2020-03-24 DIAGNOSIS — R0600 Dyspnea, unspecified: Secondary | ICD-10-CM | POA: Diagnosis not present

## 2020-03-24 DIAGNOSIS — E7800 Pure hypercholesterolemia, unspecified: Secondary | ICD-10-CM | POA: Diagnosis not present

## 2020-03-24 DIAGNOSIS — Z6836 Body mass index (BMI) 36.0-36.9, adult: Secondary | ICD-10-CM

## 2020-03-24 NOTE — Progress Notes (Signed)
Cardiology Office Note:    Date:  03/24/2020   ID:  Caitlin Barrett, DOB 1970-06-03, MRN [id number]  PCP:  Smitty Cords, DO  [facility] Cardiologist:  Debbe Odea, MD  [facility] Electrophysiologist:  None   Referring MD: Saralyn Pilar *   Chief Complaint  Patient presents with  . Follow-up    Echo & Stress test; c/o pressure & pain in chest and feeling tired.     History of Present Illness:    Caitlin Barrett is a 49 y.o. female with a hx of anxiety, GERD, hyperlipidemia who presents for follow-up.  She was last seen due to chest pain and shortness of breath.  Shortness of breath associated with exertion.  Due to symptoms, echo and coronary CTA was ordered to evaluate presence of CAD.  Coronary CTA was not approved by her insurance, she had a Lexiscan Myoview performed instead.  She now presents for results.  She still has occasional nonspecific chest discomfort and shortness of breath.  She goes to the weight loss program in [place], has lost about 40 pounds over the past 6 months.   Past Medical History:  Diagnosis Date  . Allergy    seasonal.  Takes allergy shots at [facility]  . Anemia   . Anxiety   . Arthritis   . Asthma    exercise induced--no inhalers  . Back pain   . Cancer (HCC) 2010   Basal cell skin cancer Lt eye, skin graft above Lt eye  . Constipation   . Costochondritis   . Depression   . Dysmenorrhea   . Eczema   . Edema, lower extremity   . GERD (gastroesophageal reflux disease)   . Headache   . Hypothyroid    Nodules and Fine needle aspiration  . IBS (irritable bowel syndrome)   . Lab test positive for detection of COVID-19 virus    November 2020  . Myalgia   . Osteoarthritis   . Painful menstrual periods   . Palpitations   . Plantar fasciitis   . Shortness of breath   . Sprain of carpal (joint) of wrist   . Vitamin B 12 deficiency   . Vitamin D deficiency     Past Surgical History:  Procedure  Laterality Date  . ABDOMINAL HYSTERECTOMY Bilateral 07/02/2016   Procedure: HYSTERECTOMY ABDOMINAL WITH BILATERAL SALPINGECTOMY-MIDLINE INCISION;  Surgeon: Herold Harms, MD;  Location: [facility] ORS;  Service: Gynecology;  Laterality: Bilateral;  . CHOLECYSTECTOMY  2009  . HERNIA REPAIR  1974   umbilical  . SKIN GRAFT  2010  . TONSILLECTOMY      Current Medications: Current Meds  Medication Sig  . albuterol (VENTOLIN HFA) 108 (90 Base) MCG/ACT inhaler TAKE 2 PUFFS BY MOUTH EVERY 6 HOURS AS NEEDED FOR WHEEZE OR SHORTNESS OF BREATH  . baclofen (LIORESAL) 10 MG tablet TAKE 1/2 TO 1 TABLET BY MOUTH AT BEDTIME AS NEEDED FOR MUSCLE SPASM  . clobetasol cream (TEMOVATE) 0.05 % Apply 1 application topically 2 (two) times daily. For up to 1-2 weeks then as needed. Do not use on sensitive skin.  . COLLAGEN-BORON-HYALURONIC ACID PO Take by mouth.  . cyclobenzaprine (FLEXERIL) 10 MG tablet Take 1 tablet (10 mg total) by mouth 2 (two) times daily as needed for muscle spasms.  . diphenhydrAMINE (BENADRYL) 25 mg capsule Take 25 mg by mouth every 6 (six) hours as needed.  Marland Kitchen EPINEPHrine 0.3 mg/0.3 mL IJ SOAJ injection 0.3 mg as needed.   Marland Kitchen  estradiol (ESTRACE) 1 MG tablet Take 1 tablet (1 mg total) by mouth daily.  Marland Kitchen ibuprofen (ADVIL) 600 MG tablet Take 1 tablet (600 mg total) by mouth every 6 (six) hours as needed.  . Loratadine 10 MG CAPS Take by mouth.  . Melatonin 3 MG TABS Take by mouth at bedtime.   . Misc Natural Products (OSTEO BI-FLEX ADV TRIPLE ST PO) Take by mouth.  . Multiple Vitamins-Minerals (MULTIVITAMIN WOMEN PO) Take by mouth.  . ondansetron (ZOFRAN) 4 MG tablet Take by mouth as needed.   Marland Kitchen oxymetazoline (AFRIN) 0.05 % nasal spray Place 1 spray into both nostrils as needed.   . Probiotic Product (TRUBIOTICS) CAPS Take 1 capsule by mouth daily.  . progesterone (PROMETRIUM) 200 MG capsule Take 1 capsule (200 mg total) by mouth daily.  Marland Kitchen triamcinolone cream (KENALOG) 0.1 % APPLY THIN FILM  TWICE DAILY TO ECZEMA UNTIL CLEAR  . TURMERIC PO Take by mouth daily.  . Vitamin D, Ergocalciferol, (DRISDOL) 1.25 MG (50000 UNIT) CAPS capsule Take 1 capsule (50,000 Units total) by mouth every 7 (seven) days.     Allergies:   Contrast media [iodinated diagnostic agents], Codeine, and Other   Social History   Socioeconomic History  . Marital status: Married    Spouse name: Not on file  . Number of children: Not on file  . Years of education: Not on file  . Highest education level: Not on file  Occupational History  . Occupation: Psychologist, prison and probation services  Tobacco Use  . Smoking status: Never Smoker  . Smokeless tobacco: Never Used  Vaping Use  . Vaping Use: Never used  Substance and Sexual Activity  . Alcohol use: Not Currently    Comment: rare  . Drug use: No  . Sexual activity: Yes    Birth control/protection: Surgical  Other Topics Concern  . Not on file  Social History Narrative  . Not on file   Social Determinants of Health   Financial Resource Strain: Not on file  Food Insecurity: Not on file  Transportation Needs: Not on file  Physical Activity: Not on file  Stress: Not on file  Social Connections: Not on file     Family History: The patient's family history includes Cancer in her father; Diabetes in her maternal grandfather and maternal grandmother; Heart attack in her maternal grandfather; Heart disease in her maternal grandfather and maternal grandmother; Hyperlipidemia in her mother; Hypertension in her maternal grandmother and mother; Obesity in her mother; Sleep apnea in her mother; Stroke in her maternal grandmother.  ROS:   Please see the history of present illness.     All other systems reviewed and are negative.  EKGs/Labs/Other Studies Reviewed:    The following studies were reviewed today:   EKG:  EKG is  ordered today.  The ekg ordered today demonstrates normal sinus rhythm, normal EKG  Recent Labs: 08/13/2019: Hemoglobin 13.3; Platelets 373;  TSH 0.081 12/15/2019: ALT 15; BUN 17; Creatinine, Ser 0.64; Potassium 4.5; Sodium 138  Recent Lipid Panel    Component Value Date/Time   CHOL 187 12/15/2019 0833   TRIG 145 12/15/2019 0833   HDL 33 (L) 12/15/2019 0833   CHOLHDL 5.7 (H) 12/15/2019 0833   CHOLHDL 4.8 10/23/2017 0830   VLDL 19 10/01/2016 0835   LDLCALC 128 (H) 12/15/2019 0833   LDLCALC 128 (H) 10/23/2017 0830     Risk Assessment/Calculations:      Physical Exam:    VS:  BP 110/78 (BP Location: Left Arm,  Patient Position: Sitting, Cuff Size: Large)   Pulse 62   Ht 5\' 9"  (1.753 m)   Wt 247 lb 8 oz (112.3 kg)   LMP 05/26/2016 (Exact Date)   SpO2 99%   BMI 36.55 kg/m     Wt Readings from Last 3 Encounters:  03/24/20 247 lb 8 oz (112.3 kg)  02/25/20 239 lb (108.4 kg)  02/12/20 249 lb 6 oz (113.1 kg)     GEN:  Well nourished, well developed in no acute distress HEENT: Normal NECK: No JVD; No carotid bruits LYMPHATICS: No lymphadenopathy CARDIAC: RRR, no murmurs, rubs, gallops RESPIRATORY:  Clear to auscultation without rales, wheezing or rhonchi  ABDOMEN: Soft, non-tender, non-distended MUSCULOSKELETAL:  No edema; No deformity  SKIN: Warm and dry NEUROLOGIC:  Alert and oriented x 3 PSYCHIATRIC:  Normal affect   ASSESSMENT:    1. Dyspnea on exertion   2. Pure hypercholesterolemia   3. BMI 36.0-36.9,adult    PLAN:    In order of problems listed above:  1. chest pain, dyspnea on exertion.  Risk factors of hyperlipidemia.  Echocardiogram showed normal systolic and diastolic function, EF 55 to 60%.  Lexiscan Myoview with no evidence for ischemia.  Patient made aware of results.  Symptoms of shortness of breath likely from deconditioning.  Graduated exercising, weight loss advised.  If symptoms improve with weight loss and exercising, continue reassurance.  Will consider reordering coronary CTA/left heart cath if patient has worsening or persistent symptoms. 2. Obesity, low-calorie diet, weight loss  advised.  3. Hyperlipidemia, 10-year ASCVD risk 1.9%.  Not in statin benefit group.  Low-cholesterol diet advised.  Follow-up in 3 months  Shared Decision Making/Informed Consent       Medication Adjustments/Labs and Tests Ordered: Current medicines are reviewed at length with the patient today.  Concerns regarding medicines are outlined above.  Orders Placed This Encounter  Procedures  . EKG 12-Lead   No orders of the defined types were placed in this encounter.   Patient Instructions  Medication Instructions:  Your physician recommends that you continue on your current medications as directed. Please refer to the Current Medication list given to you today.  *If you need a refill on your cardiac medications before your next appointment, please call your pharmacy*   Lab Work: None Ordered If you have labs (blood work) drawn today and your tests are completely normal, you will receive your results only by: Marland Kitchen MyChart Message (if you have MyChart) OR . A paper copy in the mail If you have any lab test that is abnormal or we need to change your treatment, we will call you to review the results.   Testing/Procedures: None Ordered   Follow-Up: At [facility], you and your health needs are our priority.  As part of our continuing mission to provide you with exceptional heart care, we have created designated Provider Care Teams.  These Care Teams include your primary Cardiologist (physician) and Advanced Practice Providers (APPs -  Physician Assistants and Nurse Practitioners) who all work together to provide you with the care you need, when you need it.  We recommend signing up for the patient portal called "MyChart".  Sign up information is provided on this After Visit Summary.  MyChart is used to connect with patients for Virtual Visits (Telemedicine).  Patients are able to view lab/test results, encounter notes, upcoming appointments, etc.  Non-urgent messages can be sent to  your provider as well.   To learn more about what you can  do with MyChart, go to [email or web address].    Your next appointment:   3 month(s)  The format for your next appointment:   In Person  Provider:   Debbe Odea, MD   Other Instructions      Signed, Debbe Odea, MD  03/24/2020 1:02 PM    [facility]

## 2020-03-24 NOTE — Patient Instructions (Addendum)
Medication Instructions:  Your physician recommends that you continue on your current medications as directed. Please refer to the Current Medication list given to you today.  *If you need a refill on your cardiac medications before your next appointment, please call your pharmacy*   Lab Work: None Ordered If you have labs (blood work) drawn today and your tests are completely normal, you will receive your results only by: Marland Kitchen MyChart Message (if you have MyChart) OR . A paper copy in the mail If you have any lab test that is abnormal or we need to change your treatment, we will call you to review the results.   Testing/Procedures: None Ordered   Follow-Up: At [facility], you and your health needs are our priority.  As part of our continuing mission to provide you with exceptional heart care, we have created designated Provider Care Teams.  These Care Teams include your primary Cardiologist (physician) and Advanced Practice Providers (APPs -  Physician Assistants and Nurse Practitioners) who all work together to provide you with the care you need, when you need it.  We recommend signing up for the patient portal called "MyChart".  Sign up information is provided on this After Visit Summary.  MyChart is used to connect with patients for Virtual Visits (Telemedicine).  Patients are able to view lab/test results, encounter notes, upcoming appointments, etc.  Non-urgent messages can be sent to your provider as well.   To learn more about what you can do with MyChart, go to [email or web address].    Your next appointment:   3 month(s)  The format for your next appointment:   In Person  Provider:   Kate Sable, MD   Other Instructions

## 2020-03-31 ENCOUNTER — Ambulatory Visit (INDEPENDENT_AMBULATORY_CARE_PROVIDER_SITE_OTHER): Payer: PRIVATE HEALTH INSURANCE | Admitting: Adult Health

## 2020-03-31 ENCOUNTER — Encounter (INDEPENDENT_AMBULATORY_CARE_PROVIDER_SITE_OTHER): Payer: Self-pay | Admitting: Adult Health

## 2020-03-31 ENCOUNTER — Ambulatory Visit
Admission: EM | Admit: 2020-03-31 | Discharge: 2020-03-31 | Disposition: A | Discharge status: Home / Self Care | Payer: PRIVATE HEALTH INSURANCE | Attending: Emergency Medicine | Admitting: Emergency Medicine

## 2020-03-31 ENCOUNTER — Other Ambulatory Visit: Payer: Self-pay

## 2020-03-31 VITALS — BP 127/80 | HR 69 | Temp 97.8°F | Ht 69.0 in | Wt 237.0 lb

## 2020-03-31 DIAGNOSIS — R5383 Other fatigue: Secondary | ICD-10-CM

## 2020-03-31 DIAGNOSIS — M25551 Pain in right hip: Secondary | ICD-10-CM | POA: Diagnosis not present

## 2020-03-31 DIAGNOSIS — Z9189 Other specified personal risk factors, not elsewhere classified: Secondary | ICD-10-CM | POA: Diagnosis not present

## 2020-03-31 DIAGNOSIS — S29019A Strain of muscle and tendon of unspecified wall of thorax, initial encounter: Secondary | ICD-10-CM

## 2020-03-31 DIAGNOSIS — E88819 Insulin resistance, unspecified: Secondary | ICD-10-CM

## 2020-03-31 DIAGNOSIS — E8881 Metabolic syndrome: Secondary | ICD-10-CM

## 2020-03-31 DIAGNOSIS — M25531 Pain in right wrist: Secondary | ICD-10-CM

## 2020-03-31 DIAGNOSIS — S161XXA Strain of muscle, fascia and tendon at neck level, initial encounter: Secondary | ICD-10-CM | POA: Diagnosis not present

## 2020-03-31 DIAGNOSIS — E559 Vitamin D deficiency, unspecified: Secondary | ICD-10-CM

## 2020-03-31 DIAGNOSIS — E782 Mixed hyperlipidemia: Secondary | ICD-10-CM

## 2020-03-31 DIAGNOSIS — Z6835 Body mass index (BMI) 35.0-35.9, adult: Secondary | ICD-10-CM

## 2020-03-31 MED ORDER — METHOCARBAMOL 500 MG PO TABS: 500.0000 mg | ORAL_TABLET | Freq: Two times a day (BID) | ORAL | 0 refills | Status: DC

## 2020-03-31 MED ORDER — IBUPROFEN 600 MG PO TABS: 600.0000 mg | ORAL_TABLET | Freq: Four times a day (QID) | ORAL | 0 refills | Status: DC | PRN
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
## 2020-03-31 NOTE — Discharge Instructions (Addendum)
Take the ibuprofen, 600 mg every 6 hours with food, on a schedule for the next 2 days and then as needed.  Take the methocarbamol, 1000 mg every 6 hours with food, on a schedule for the next 2 days and then as needed.  Follow-up with the neck, back, and shoulder range of motion exercises At discharge.  Apply moist heat to the affected areas for 20 minutes at a time 2-3 times a day.  If your symptoms continue, or worsen, return for reevaluation or see your primary care provider.
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
## 2020-04-01 LAB — VITAMIN D 25 HYDROXY (VIT D DEFICIENCY, FRACTURES): Vit D, 25-Hydroxy: 27.6 ng/mL — ABNORMAL LOW (ref 30.0–100.0)

## 2020-04-01 LAB — HEMOGLOBIN A1C
Est. average glucose Bld gHb Est-mCnc: 105 mg/dL
Hgb A1c MFr Bld: 5.3 % (ref 4.8–5.6)

## 2020-04-01 LAB — CBC
Hematocrit: 40.9 % (ref 34.0–46.6)
Hemoglobin: 14.2 g/dL (ref 11.1–15.9)
MCH: 30.5 pg (ref 26.6–33.0)
MCHC: 34.7 g/dL (ref 31.5–35.7)
MCV: 88 fL (ref 79–97)
Platelets: 394 10*3/uL (ref 150–450)
RBC: 4.65 x10E6/uL (ref 3.77–5.28)
RDW: 11.3 % — ABNORMAL LOW (ref 11.7–15.4)
WBC: 9.8 10*3/uL (ref 3.4–10.8)

## 2020-04-01 LAB — COMPREHENSIVE METABOLIC PANEL
ALT: 10 IU/L (ref 0–32)
AST: 8 IU/L (ref 0–40)
Albumin/Globulin Ratio: 1.9 (ref 1.2–2.2)
Albumin: 4.3 g/dL (ref 3.8–4.8)
Alkaline Phosphatase: 51 IU/L (ref 44–121)
BUN/Creatinine Ratio: 18 (ref 9–23)
BUN: 16 mg/dL (ref 6–24)
Bilirubin Total: 0.3 mg/dL (ref 0.0–1.2)
CO2: 24 mmol/L (ref 20–29)
Calcium: 9.6 mg/dL (ref 8.7–10.2)
Chloride: 102 mmol/L (ref 96–106)
Creatinine, Ser: 0.89 mg/dL (ref 0.57–1.00)
GFR calc Af Amer: 89 mL/min/{1.73_m2} (ref >59)
GFR calc non Af Amer: 77 mL/min/{1.73_m2} (ref >59)
Globulin, Total: 2.3 g/dL (ref 1.5–4.5)
Glucose: 94 mg/dL (ref 65–99)
Potassium: 4.6 mmol/L (ref 3.5–5.2)
Sodium: 140 mmol/L (ref 134–144)
Total Protein: 6.6 g/dL (ref 6.0–8.5)

## 2020-04-01 LAB — LIPID PANEL
Chol/HDL Ratio: 5.2 ratio — ABNORMAL HIGH (ref 0.0–4.4)
Cholesterol, Total: 194 mg/dL (ref 100–199)
HDL: 37 mg/dL — ABNORMAL LOW (ref >39)
LDL Chol Calc (NIH): 136 mg/dL — ABNORMAL HIGH (ref 0–99)
Triglycerides: 117 mg/dL (ref 0–149)
VLDL Cholesterol Cal: 21 mg/dL (ref 5–40)

## 2020-04-01 LAB — INSULIN, RANDOM: INSULIN: 12.4 u[IU]/mL (ref 2.6–24.9)

## 2020-04-02 ENCOUNTER — Other Ambulatory Visit (INDEPENDENT_AMBULATORY_CARE_PROVIDER_SITE_OTHER): Payer: Self-pay | Admitting: Adult Health

## 2020-04-02 DIAGNOSIS — E559 Vitamin D deficiency, unspecified: Secondary | ICD-10-CM

## 2020-04-04 DIAGNOSIS — R5383 Other fatigue: Secondary | ICD-10-CM | POA: Insufficient documentation

## 2020-04-04 NOTE — Progress Notes (Signed)
Chief Complaint:   OBESITY Caitlin Barrett is here to discuss her progress with her obesity treatment plan along with follow-up of her obesity related diagnoses. [name] is on the Category 3 Plan and states she is following her eating plan approximately 80% of the time. Noor states she is exercising 0 minutes 0 times per week.  Today's visit was #: 11 Starting weight: 277 lbs Starting date: 08/13/2019 Today's weight: 237 lbs Today's date: 03/31/2020 Total lbs lost to date: 40 lbs Total lbs lost since last in-office visit: 2 lbs  Interim History: Zaiya was seen by cardiology recently and ECHO/stress test were normal. Her insurance would not cover the ordered coronary CTA. She continues to experience dyspnea on exertion. She has a follow up with cardiology in 3 months.  Subjective:   1. Vitamin D deficiency Caitlin Barrett's Vitamin D level was 36.7 on 12/15/2019. She is currently taking prescription vitamin D 50,000 IU each week. She denies nausea, vomiting or muscle weakness.  Ref. Range 12/15/2019 08:33  Vitamin D, 25-Hydroxy Latest Ref Range: 30.0 - 100.0 ng/mL 36.7   2. Insulin resistance Caitlin Barrett has a diagnosis of insulin resistance based on her elevated fasting insulin level >5. She continues to work on diet and exercise to decrease her risk of diabetes. 12/15/2019 blood glucose 94, A1c 5.5, and insulin level above 13.5.  Lab Results  Component Value Date   INSULIN 12.4 03/31/2020   INSULIN 13.5 12/15/2019   INSULIN 19.0 08/13/2019   Lab Results  Component Value Date   HGBA1C 5.3 03/31/2020    3. Mixed hyperlipidemia Caitlin Barrett has hyperlipidemia and has been trying to improve her cholesterol levels with intensive lifestyle modification including a low saturated fat diet, exercise and weight loss. She denies any chest pain, claudication or myalgias. 12/15/2019 LDL above goal, HDL is low. Her mother and twin sister (paternal) both have HTN and HLD and her twin sister has  anemia.   Ref. Range 12/15/2019 08:33  Total CHOL/HDL Ratio Latest Ref Range: 0.0 - 4.4 ratio 5.7 (H)  Cholesterol, Total Latest Ref Range: 100 - 199 mg/dL 187  HDL Cholesterol Latest Ref Range: >39 mg/dL 33 (L)  Triglycerides Latest Ref Range: 0 - 149 mg/dL 145  VLDL Cholesterol Cal Latest Ref Range: 5 - 40 mg/dL 26  LDL Chol Calc (NIH) Latest Ref Range: 0 - 99 mg/dL 128 (H)    4. Fatigue, unspecified type Caitlin Barrett has a history of anemia and is not on replacement therapy. Her paternal twin sister has anemia, been treated with ferrous sulfate.   5. At risk for heart disease Caitlin Barrett is at a higher than average risk for cardiovascular disease due to obesity and hyperlipidemia.  Assessment/Plan:   1. Vitamin D deficiency Low Vitamin D level contributes to fatigue and are associated with obesity, breast, and colon cancer. She agrees to continue to take prescription Vitamin D @50 ,000 IU every week and will follow-up for routine testing of Vitamin D, at least 2-3 times per year to avoid over-replacement. Check labs today.  - VITAMIN D 25 Hydroxy (Vit-D Deficiency, Fractures)  2. Insulin resistance Caitlin Barrett will continue to work on weight loss, exercise, and decreasing simple carbohydrates to help decrease the risk of diabetes. Caitlin Barrett agreed to follow-up with [place] as directed to closely monitor her progress. Check labs today.  - Hemoglobin A1c - Insulin, random  3. Mixed hyperlipidemia Cardiovascular risk and specific lipid/LDL goals reviewed.  We discussed several lifestyle modifications today and Mehr will continue to work on  diet, exercise and weight loss efforts. Orders and follow up as documented in patient record. Check labs today.   Counseling Intensive lifestyle modifications are the first line treatment for this issue. . Dietary changes: Increase soluble fiber. Decrease simple carbohydrates. . Exercise changes: Moderate to vigorous-intensity aerobic activity 150 minutes per  week if tolerated. . Lipid-lowering medications: see documented in medical record.   - Comprehensive metabolic panel - Lipid panel  4. Fatigue, unspecified type Caitlin Barrett does feel that her weight is causing her energy to be lower than it should be. Fatigue may be related to obesity, depression or many other causes. Labs will be ordered, and in the meanwhile, Ryenn will focus on self care including making healthy food choices, increasing physical activity and focusing on stress reduction. Check labs today.  - CBC  5. At risk for heart disease [name] was given approximately 15 minutes of coronary artery disease prevention counseling today. She is 50 y.o. female and has risk factors for heart disease including obesity. We discussed intensive lifestyle modifications today with an emphasis on specific weight loss instructions and strategies.   Repetitive spaced learning was employed today to elicit superior memory formation and behavioral change.  6. Class 2 severe obesity with serious comorbidity and body mass index (BMI) of 35.0 to 35.9 in adult, unspecified obesity type Caitlin Barrett) Caitlin Barrett is currently in the action stage of change. As such, her goal is to continue with weight loss efforts. She has agreed to the Category 3 Plan.   Exercise goals: Increase cardiovascular exercise in addition to regular walking.  Behavioral modification strategies: increasing lean protein intake, meal planning and cooking strategies and planning for success.  Caitlin Barrett has agreed to follow-up with our clinic in 4 weeks. She was informed of the importance of frequent follow-up visits to maximize her success with intensive lifestyle modifications for her multiple health conditions.   [name] was informed we would discuss her lab results at her next visit unless there is a critical issue that needs to be addressed sooner. Caitlin Barrett agreed to keep her next visit at the agreed upon time to discuss these  results.  Objective:   Blood pressure 127/80, pulse 69, temperature 97.8 F (36.6 C), temperature source Oral, height 5' 9"  (1.753 m), weight 237 lb (107.5 kg), last menstrual period 05/26/2016, SpO2 95 %. Body mass index is 35 kg/m.  General: Cooperative, alert, well developed, in no acute distress. HEENT: Conjunctivae and lids unremarkable. Cardiovascular: Regular rhythm.  Lungs: Normal work of breathing. Neurologic: No focal deficits.   Lab Results  Component Value Date   CREATININE 0.89 03/31/2020   BUN 16 03/31/2020   NA 140 03/31/2020   K 4.6 03/31/2020   CL 102 03/31/2020   CO2 24 03/31/2020   Lab Results  Component Value Date   ALT 10 03/31/2020   AST 8 03/31/2020   ALKPHOS 51 03/31/2020   BILITOT 0.3 03/31/2020   Lab Results  Component Value Date   HGBA1C 5.3 03/31/2020   HGBA1C 5.5 12/15/2019   HGBA1C 5.3 08/13/2019   HGBA1C 5.3 10/23/2017   HGBA1C 5.0 10/01/2016   Lab Results  Component Value Date   INSULIN 12.4 03/31/2020   INSULIN 13.5 12/15/2019   INSULIN 19.0 08/13/2019   Lab Results  Component Value Date   TSH 0.081 (L) 08/13/2019   Lab Results  Component Value Date   CHOL 194 03/31/2020   HDL 37 (L) 03/31/2020   LDLCALC 136 (H) 03/31/2020   TRIG 117 03/31/2020  CHOLHDL 5.2 (H) 03/31/2020   Lab Results  Component Value Date   WBC 9.8 03/31/2020   HGB 14.2 03/31/2020   HCT 40.9 03/31/2020   MCV 88 03/31/2020   PLT 394 03/31/2020   Lab Results  Component Value Date   IRON 64 08/13/2019   TIBC 333 08/13/2019   FERRITIN 30 08/13/2019    Attestation Statements:   Reviewed by clinician on day of visit: allergies, medications, problem list, medical history, surgical history, family history, social history, and previous encounter notes.   Coral Ceo, am acting as Location manager for Mina Marble, NP.  I have reviewed the above documentation for accuracy and completeness, and I agree with the above. -  Keavon Sensing d. Almina Schul,  NP-C

## 2020-04-06 ENCOUNTER — Ambulatory Visit: Payer: PRIVATE HEALTH INSURANCE | Admitting: Family Medicine

## 2020-04-08 ENCOUNTER — Encounter: Payer: Self-pay | Admitting: Family Medicine

## 2020-04-08 ENCOUNTER — Ambulatory Visit: Payer: PRIVATE HEALTH INSURANCE | Admitting: Family Medicine

## 2020-04-08 ENCOUNTER — Other Ambulatory Visit: Payer: Self-pay

## 2020-04-08 VITALS — BP 137/64 | HR 74 | Ht 69.0 in | Wt 250.0 lb

## 2020-04-08 DIAGNOSIS — M542 Cervicalgia: Secondary | ICD-10-CM

## 2020-04-08 DIAGNOSIS — M25511 Pain in right shoulder: Secondary | ICD-10-CM | POA: Diagnosis not present

## 2020-04-08 DIAGNOSIS — S134XXA Sprain of ligaments of cervical spine, initial encounter: Secondary | ICD-10-CM

## 2020-04-08 DIAGNOSIS — M545 Low back pain, unspecified: Secondary | ICD-10-CM | POA: Diagnosis not present

## 2020-04-08 MED ORDER — METHOCARBAMOL 500 MG PO TABS
500.0000 mg | ORAL_TABLET | Freq: Every day | ORAL | 1 refills | Status: DC | PRN
Start: 2020-04-08 — End: 2020-06-17

## 2020-04-08 MED ORDER — CYCLOBENZAPRINE HCL 10 MG PO TABS
10.0000 mg | ORAL_TABLET | Freq: Every evening | ORAL | 1 refills | Status: DC | PRN
Start: 2020-04-08 — End: 2020-06-17
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
## 2020-04-21 ENCOUNTER — Other Ambulatory Visit: Payer: Self-pay

## 2020-04-21 ENCOUNTER — Ambulatory Visit (INDEPENDENT_AMBULATORY_CARE_PROVIDER_SITE_OTHER): Payer: PRIVATE HEALTH INSURANCE | Admitting: Adult Health

## 2020-04-21 ENCOUNTER — Encounter (INDEPENDENT_AMBULATORY_CARE_PROVIDER_SITE_OTHER): Payer: Self-pay | Admitting: Adult Health

## 2020-04-21 VITALS — BP 135/83 | HR 60 | Temp 97.7°F | Ht 69.0 in | Wt 242.0 lb

## 2020-04-21 DIAGNOSIS — Z9189 Other specified personal risk factors, not elsewhere classified: Secondary | ICD-10-CM

## 2020-04-21 DIAGNOSIS — E7849 Other hyperlipidemia: Secondary | ICD-10-CM | POA: Diagnosis not present

## 2020-04-21 DIAGNOSIS — E559 Vitamin D deficiency, unspecified: Secondary | ICD-10-CM

## 2020-04-21 DIAGNOSIS — Z6835 Body mass index (BMI) 35.0-35.9, adult: Secondary | ICD-10-CM

## 2020-04-21 DIAGNOSIS — S161XXS Strain of muscle, fascia and tendon at neck level, sequela: Secondary | ICD-10-CM

## 2020-04-21 DIAGNOSIS — E8881 Metabolic syndrome: Secondary | ICD-10-CM

## 2020-04-21 MED ORDER — VITAMIN D (ERGOCALCIFEROL) 1.25 MG (50000 UNIT) PO CAPS: 50000.0000 [IU] | ORAL_CAPSULE | ORAL | 0 refills | Status: DC

## 2020-04-25 DIAGNOSIS — S161XXA Strain of muscle, fascia and tendon at neck level, initial encounter: Secondary | ICD-10-CM | POA: Insufficient documentation
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
## 2020-05-01 ENCOUNTER — Other Ambulatory Visit: Payer: Self-pay | Admitting: Certified Nurse Midwife

## 2020-05-05 ENCOUNTER — Ambulatory Visit (INDEPENDENT_AMBULATORY_CARE_PROVIDER_SITE_OTHER): Payer: PRIVATE HEALTH INSURANCE | Admitting: Adult Health

## 2020-05-12 ENCOUNTER — Other Ambulatory Visit: Payer: Self-pay

## 2020-05-12 ENCOUNTER — Ambulatory Visit (INDEPENDENT_AMBULATORY_CARE_PROVIDER_SITE_OTHER): Payer: PRIVATE HEALTH INSURANCE | Admitting: Adult Health

## 2020-05-12 ENCOUNTER — Encounter (INDEPENDENT_AMBULATORY_CARE_PROVIDER_SITE_OTHER): Payer: Self-pay | Admitting: Adult Health

## 2020-05-12 VITALS — BP 126/80 | HR 69 | Temp 98.1°F | Ht 69.0 in | Wt 241.0 lb

## 2020-05-12 DIAGNOSIS — Z6835 Body mass index (BMI) 35.0-35.9, adult: Secondary | ICD-10-CM | POA: Diagnosis not present

## 2020-05-12 DIAGNOSIS — E559 Vitamin D deficiency, unspecified: Secondary | ICD-10-CM

## 2020-05-12 DIAGNOSIS — E782 Mixed hyperlipidemia: Secondary | ICD-10-CM

## 2020-05-16 NOTE — Progress Notes (Signed)
Chief Complaint:   OBESITY Caitlin Barrett is here to discuss her progress with her obesity treatment plan along with follow-up of her obesity related diagnoses. Caitlin Barrett is on the Category 3 Plan and states she is following her eating plan approximately 85% of the time. Caitlin Barrett states she is doing 0 minutes 0 times per week.  Today's visit was #: 13 Starting weight: 277 lbs Starting date: 08/13/2019 Today's weight: 241 lbs Today's date: 05/12/2020 Total lbs lost to date: 36 lbs Total lbs lost since last in-office visit: 1 lb  Interim History: Caitlin Barrett's dog, [name], is not feeling well again. antibiotic therapy has been re-started. Caitlin Barrett has been typically consuming the following per day: Breakfast- premier protein cereal every morning- 1 serving 180 cal/20 g protein Lunch- 4 oz meat sandwich with fruit Dinner- lean protein with vegetables.  She downloaded [employer] fitness app, but has yet to use it.   Subjective:   1. Vitamin D deficiency Caitlin Barrett's Vitamin D level was 27.6 on 03/31/2020, which is well below goal of 50. She is currently taking prescription vitamin D 50,000 IU every 3 days. She denies nausea, vomiting or muscle weakness.   Ref. Range 03/31/2020 08:14  Vitamin D, 25-Hydroxy Latest Ref Range: 30.0 - 100.0 ng/mL 27.6 (L)   2. Mixed hyperlipidemia Caitlin Barrett has increased her fish and baked chicken intake. She has reduced red meat intake to 1 time a week maximum.  Lab Results  Component Value Date   ALT 10 03/31/2020   AST 8 03/31/2020   ALKPHOS 51 03/31/2020   BILITOT 0.3 03/31/2020   Lab Results  Component Value Date   CHOL 194 03/31/2020   HDL 37 (L) 03/31/2020   LDLCALC 136 (H) 03/31/2020   TRIG 117 03/31/2020   CHOLHDL 5.2 (H) 03/31/2020    Assessment/Plan:   1. Vitamin D deficiency Low Vitamin D level contributes to fatigue and are associated with obesity, breast, and colon cancer. She agrees to continue to take prescription Vitamin D @50 ,000 IU every 3  days and will follow-up for routine testing of Vitamin D, at least 2-3 times per year to avoid over-replacement.  2. Mixed hyperlipidemia Cardiovascular risk and specific lipid/LDL goals reviewed.  We discussed several lifestyle modifications today and Caitlin Barrett will continue to work on diet, exercise and weight loss efforts. Orders and follow up as documented in patient record. Continue to reduce Saturated fat and increase regular exercise.  Counseling Intensive lifestyle modifications are the first line treatment for this issue. . Dietary changes: Increase soluble fiber. Decrease simple carbohydrates. . Exercise changes: Moderate to vigorous-intensity aerobic activity 150 minutes per week if tolerated. . Lipid-lowering medications: see documented in medical record.  3. Class 2 severe obesity with serious comorbidity and body mass index (BMI) of 35.0 to 35.9 in adult, unspecified obesity type Caitlin Barrett) Caitlin Barrett is currently in the action stage of change. As such, her goal is to continue with weight loss efforts. She has agreed to the Category 3 Plan.   Exercise goals: As is- walking 2-2.5 miles a day with job and start resistence training bands 2-3 times a week.  Behavioral modification strategies: increasing lean protein intake, increasing water intake, no skipping meals, meal planning and cooking strategies and planning for success.  Caitlin Barrett has agreed to follow-up with our clinic in 3 weeks. She was informed of the importance of frequent follow-up visits to maximize her success with intensive lifestyle modifications for her multiple health conditions.   Objective:   Blood pressure 126/80, pulse  69, temperature 98.1 F (36.7 C), height 5' 9"  (1.753 m), weight 241 lb (109.3 kg), last menstrual period 05/26/2016, SpO2 99 %. Body mass index is 35.59 kg/m.  General: Cooperative, alert, well developed, in no acute distress. HEENT: Conjunctivae and lids unremarkable. Cardiovascular: Regular  rhythm.  Lungs: Normal work of breathing. Neurologic: No focal deficits.   Lab Results  Component Value Date   CREATININE 0.89 03/31/2020   BUN 16 03/31/2020   NA 140 03/31/2020   K 4.6 03/31/2020   CL 102 03/31/2020   CO2 24 03/31/2020   Lab Results  Component Value Date   ALT 10 03/31/2020   AST 8 03/31/2020   ALKPHOS 51 03/31/2020   BILITOT 0.3 03/31/2020   Lab Results  Component Value Date   HGBA1C 5.3 03/31/2020   HGBA1C 5.5 12/15/2019   HGBA1C 5.3 08/13/2019   HGBA1C 5.3 10/23/2017   HGBA1C 5.0 10/01/2016   Lab Results  Component Value Date   INSULIN 12.4 03/31/2020   INSULIN 13.5 12/15/2019   INSULIN 19.0 08/13/2019   Lab Results  Component Value Date   TSH 0.081 (L) 08/13/2019   Lab Results  Component Value Date   CHOL 194 03/31/2020   HDL 37 (L) 03/31/2020   LDLCALC 136 (H) 03/31/2020   TRIG 117 03/31/2020   CHOLHDL 5.2 (H) 03/31/2020   Lab Results  Component Value Date   WBC 9.8 03/31/2020   HGB 14.2 03/31/2020   HCT 40.9 03/31/2020   MCV 88 03/31/2020   PLT 394 03/31/2020   Lab Results  Component Value Date   IRON 64 08/13/2019   TIBC 333 08/13/2019   FERRITIN 30 08/13/2019    Attestation Statements:   Reviewed by clinician on day of visit: allergies, medications, problem list, medical history, surgical history, family history, social history, and previous encounter notes.  Time spent on visit including pre-visit chart review and post-visit care and charting was 35 minutes.   Coral Ceo, am acting as Location manager for Mina Marble, NP.  I have reviewed the above documentation for accuracy and completeness, and I agree with the above. -  Sanaia Jasso d. Virgel Haro, NP-C

## 2020-06-09 ENCOUNTER — Other Ambulatory Visit: Payer: Self-pay

## 2020-06-09 ENCOUNTER — Encounter (INDEPENDENT_AMBULATORY_CARE_PROVIDER_SITE_OTHER): Payer: Self-pay | Admitting: Adult Health

## 2020-06-09 ENCOUNTER — Ambulatory Visit (INDEPENDENT_AMBULATORY_CARE_PROVIDER_SITE_OTHER): Payer: PRIVATE HEALTH INSURANCE | Admitting: Adult Health

## 2020-06-09 VITALS — BP 126/80 | HR 69 | Temp 97.4°F | Ht 69.0 in | Wt 245.0 lb

## 2020-06-09 DIAGNOSIS — Z6836 Body mass index (BMI) 36.0-36.9, adult: Secondary | ICD-10-CM

## 2020-06-09 DIAGNOSIS — E8881 Metabolic syndrome: Secondary | ICD-10-CM | POA: Diagnosis not present

## 2020-06-09 DIAGNOSIS — E559 Vitamin D deficiency, unspecified: Secondary | ICD-10-CM | POA: Diagnosis not present

## 2020-06-09 DIAGNOSIS — Z9189 Other specified personal risk factors, not elsewhere classified: Secondary | ICD-10-CM | POA: Diagnosis not present

## 2020-06-09 DIAGNOSIS — E079 Disorder of thyroid, unspecified: Secondary | ICD-10-CM | POA: Insufficient documentation

## 2020-06-09 MED ORDER — VITAMIN D (ERGOCALCIFEROL) 1.25 MG (50000 UNIT) PO CAPS: 50000.0000 [IU] | ORAL_CAPSULE | ORAL | 0 refills | Status: DC

## 2020-06-10 LAB — COMPREHENSIVE METABOLIC PANEL
ALT: 23 IU/L (ref 0–32)
AST: 21 IU/L (ref 0–40)
Albumin/Globulin Ratio: 1.5 (ref 1.2–2.2)
Albumin: 4.7 g/dL (ref 3.8–4.8)
Alkaline Phosphatase: 111 IU/L (ref 44–121)
BUN/Creatinine Ratio: 17 (ref 9–23)
BUN: 11 mg/dL (ref 6–24)
Bilirubin Total: 0.5 mg/dL (ref 0.0–1.2)
CO2: 19 mmol/L — ABNORMAL LOW (ref 20–29)
Calcium: 9.5 mg/dL (ref 8.7–10.2)
Chloride: 101 mmol/L (ref 96–106)
Creatinine, Ser: 0.66 mg/dL (ref 0.57–1.00)
Globulin, Total: 3.2 g/dL (ref 1.5–4.5)
Glucose: 93 mg/dL (ref 65–99)
Potassium: 4.4 mmol/L (ref 3.5–5.2)
Sodium: 140 mmol/L (ref 134–144)
Total Protein: 7.9 g/dL (ref 6.0–8.5)
eGFR: 109 mL/min/{1.73_m2} (ref >59)

## 2020-06-10 LAB — LIPID PANEL
Chol/HDL Ratio: 3.9 ratio (ref 0.0–4.4)
Cholesterol, Total: 246 mg/dL — ABNORMAL HIGH (ref 100–199)
HDL: 63 mg/dL (ref >39)
LDL Chol Calc (NIH): 168 mg/dL — ABNORMAL HIGH (ref 0–99)
Triglycerides: 89 mg/dL (ref 0–149)
VLDL Cholesterol Cal: 15 mg/dL (ref 5–40)

## 2020-06-10 LAB — INSULIN, RANDOM: INSULIN: 8.9 u[IU]/mL (ref 2.6–24.9)

## 2020-06-10 LAB — VITAMIN D 25 HYDROXY (VIT D DEFICIENCY, FRACTURES): Vit D, 25-Hydroxy: 49.2 ng/mL (ref 30.0–100.0)
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
## 2020-06-13 ENCOUNTER — Ambulatory Visit: Payer: PRIVATE HEALTH INSURANCE | Admitting: Family Medicine

## 2020-06-13 NOTE — Progress Notes (Signed)
Chief Complaint:   OBESITY Caitlin Barrett is here to discuss her progress with her obesity treatment plan along with follow-up of her obesity related diagnoses. Caitlin Barrett is on the Category 3 Plan and states she is following her eating plan approximately 85% of the time. Caitlin Barrett states she is walking and bands 30 minutes 2 times per week.  Today's visit was #: 14 Starting weight: 277 lbs Starting date: 08/13/2019 Today's weight: 245 lbs Today's date: 06/09/2020 Total lbs lost to date: 32 lbs Total lbs lost since last in-office visit: 0  Interim History: In the past several weeks, Alexei traveled to [facility] for a girls weekend, and then to [facility]. She has eaten off plan during travel. She will be home for the next 4 weeks. Will resume consistent meal plan/prep.  Subjective:   1. Vitamin D deficiency Caitlin Barrett's Vitamin D level was well below goal of 50, at 27.6 on 03/31/2020. She is currently taking prescription vitamin D 50,000 IU two times a week. Increased from weekly at the end of January due to sub- therapeutic Vit D levels despite months of weekly prescription Vit D.   Ref. Range 03/31/2020 08:14  Vitamin D, 25-Hydroxy Latest Ref Range: 30.0 - 100.0 ng/mL 27.6 (L)   2. Thyroid disease Caitlin Barrett's TSH suppressed, however she has been off levothyroxine since 2017. She has an [place] with endocrinology at [facility] on 06/21/2020. Her labs with endocrinology on 04/14/2020 resulted TSH 0.017, Free T4 0.64, and T3 3.0.  3. Insulin resistance Caitlin Barrett's insulin level is slightly improving at last three checks. She denies polyphagia. She is not on Metformin.   4. At risk for osteoporosis Caitlin Barrett is at higher risk of osteopenia and osteoporosis due to Vitamin D deficiency.   Assessment/Plan:   1. Vitamin D deficiency Low Vitamin D level contributes to fatigue and are associated with obesity, breast, and colon cancer. She agrees to continue to take prescription Vitamin D @50 ,000 IU  every week and will follow-up for routine testing of Vitamin D, at least 2-3 times per year to avoid over-replacement. Check labs today.  - Vitamin D, Ergocalciferol, (DRISDOL) 1.25 MG (50000 UNIT) CAPS capsule; Take 1 capsule (50,000 Units total) by mouth every 3 (three) days.  Dispense: 8 capsule; Refill: 0  2. Thyroid disease Patient with long-standing hypothyroidism, on levothyroxine therapy. She appears euthyroid. Orders and follow up as documented in patient record. Follow up with Endocrinology.  Counseling . Good thyroid control is important for overall health. Supratherapeutic thyroid levels are dangerous and will not improve weight loss results. . The correct way to take levothyroxine is fasting, with water, separated by at least 30 minutes from breakfast, and separated by more than 4 hours from calcium, iron, multivitamins, acid reflux medications (PPIs).   3. Insulin resistance Caitlin Barrett will continue to work on weight loss, exercise, and decreasing simple carbohydrates to help decrease the risk of diabetes. Caitlin Barrett agreed to follow-up with [place] as directed to closely monitor her progress. Check labs at next OV.  4. At risk for osteoporosis Caitlin Barrett was given approximately 15 minutes of osteoporosis prevention counseling today. Caitlin Barrett is at risk for osteopenia and osteoporosis due to her Vitamin D deficiency. She was encouraged to take her Vitamin D and follow her higher calcium diet and increase strengthening exercise to help strengthen her bones and decrease her risk of osteopenia and osteoporosis.  Repetitive spaced learning was employed today to elicit superior memory formation and behavioral change.  5. Class 2 severe obesity with  serious comorbidity and body mass index (BMI) of 36.0 to 36.9 in adult, unspecified obesity type [facility]) Caitlin Barrett is currently in the action stage of change. As such, her goal is to continue with weight loss efforts. She has agreed to the Category 3 Plan.    Check fasting labs at next OV.  Exercise goals: As is  Behavioral modification strategies: increasing lean protein intake, increasing water intake, meal planning and cooking strategies, better snacking choices and planning for success.  Caitlin Barrett has agreed to follow-up with our clinic in 4 weeks. She was informed of the importance of frequent follow-up visits to maximize her success with intensive lifestyle modifications for her multiple health conditions.   Objective:   Blood pressure 126/80, pulse 69, temperature (!) 97.4 F (36.3 C), height 5' 9"  (1.753 m), weight 245 lb (111.1 kg), last menstrual period 05/26/2016, SpO2 98 %. Body mass index is 36.18 kg/m.  General: Cooperative, alert, well developed, in no acute distress. HEENT: Conjunctivae and lids unremarkable. Cardiovascular: Regular rhythm.  Lungs: Normal work of breathing. Neurologic: No focal deficits.   Lab Results  Component Value Date   CREATININE 0.66 06/09/2020   BUN 11 06/09/2020   NA 140 06/09/2020   K 4.4 06/09/2020   CL 101 06/09/2020   CO2 19 (L) 06/09/2020   Lab Results  Component Value Date   ALT 23 06/09/2020   AST 21 06/09/2020   ALKPHOS 111 06/09/2020   BILITOT 0.5 06/09/2020   Lab Results  Component Value Date   HGBA1C 5.3 03/31/2020   HGBA1C 5.5 12/15/2019   HGBA1C 5.3 08/13/2019   HGBA1C 5.3 10/23/2017   HGBA1C 5.0 10/01/2016   Lab Results  Component Value Date   INSULIN 8.9 06/09/2020   INSULIN 12.4 03/31/2020   INSULIN 13.5 12/15/2019   INSULIN 19.0 08/13/2019   Lab Results  Component Value Date   TSH 0.081 (L) 08/13/2019   Lab Results  Component Value Date   CHOL 246 (H) 06/09/2020   HDL 63 06/09/2020   LDLCALC 168 (H) 06/09/2020   TRIG 89 06/09/2020   CHOLHDL 3.9 06/09/2020   Lab Results  Component Value Date   WBC 9.8 03/31/2020   HGB 14.2 03/31/2020   HCT 40.9 03/31/2020   MCV 88 03/31/2020   PLT 394 03/31/2020   Lab Results  Component Value Date   IRON  64 08/13/2019   TIBC 333 08/13/2019   FERRITIN 30 08/13/2019    Attestation Statements:   Reviewed by clinician on day of visit: allergies, medications, problem list, medical history, surgical history, family history, social history, and previous encounter notes.  Coral Ceo, am acting as Location manager for Mina Marble, NP.  I have reviewed the above documentation for accuracy and completeness, and I agree with the above. -  Jelisa [place] d. Anyssa Sharpless, NP-C

## 2020-06-17 ENCOUNTER — Other Ambulatory Visit: Payer: Self-pay

## 2020-06-17 ENCOUNTER — Ambulatory Visit (INDEPENDENT_AMBULATORY_CARE_PROVIDER_SITE_OTHER): Payer: PRIVATE HEALTH INSURANCE | Admitting: Family Medicine

## 2020-06-17 ENCOUNTER — Encounter: Payer: Self-pay | Admitting: Family Medicine

## 2020-06-17 VITALS — BP 119/80 | HR 80 | Ht 69.0 in | Wt 248.8 lb

## 2020-06-17 DIAGNOSIS — M25551 Pain in right hip: Secondary | ICD-10-CM

## 2020-06-17 DIAGNOSIS — M542 Cervicalgia: Secondary | ICD-10-CM

## 2020-06-17 DIAGNOSIS — M545 Low back pain, unspecified: Secondary | ICD-10-CM | POA: Diagnosis not present

## 2020-06-17 DIAGNOSIS — G8929 Other chronic pain: Secondary | ICD-10-CM

## 2020-06-17 DIAGNOSIS — M25511 Pain in right shoulder: Secondary | ICD-10-CM | POA: Insufficient documentation

## 2020-06-17 MED ORDER — CYCLOBENZAPRINE HCL 10 MG PO TABS: 10.0000 mg | ORAL_TABLET | Freq: Every evening | ORAL | 2 refills | Status: DC | PRN

## 2020-06-17 MED ORDER — METHOCARBAMOL 500 MG PO TABS: 500.0000 mg | ORAL_TABLET | Freq: Every day | ORAL | 2 refills | Status: DC | PRN

## 2020-06-17 NOTE — Patient Instructions (Addendum)
Thank you for coming to the office today.  Right Hip X-ray first come first serve, walk in no apt needed Mon-Thurs 8am-430, limited 12-1 lunch hour  We can send results 1-2 days after, if arthritis then may consider injection vs ortho. If no arthritis or limited, then likely just muscular keep on meds and PT  Nicole Kindred PT when ready   Please schedule a Follow-up Appointment to: Return in about 6 weeks (around 07/29/2020), or if symptoms worsen or fail to improve, for right hip pain.  If you have any other questions or concerns, please feel free to call the office or send a message through [place]. You may also schedule an earlier appointment if necessary.  Additionally, you may be receiving a survey about your experience at our office within a few days to 1 week by e-mail or mail. We value your feedback.  Nobie Putnam, DO [place]
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
## 2020-06-21 ENCOUNTER — Ambulatory Visit
Admission: RE | Admit: 2020-06-21 | Discharge: 2020-06-21 | Disposition: A | Discharge status: Home / Self Care | Payer: PRIVATE HEALTH INSURANCE | Source: Ambulatory Visit | Attending: Family Medicine | Admitting: Family Medicine

## 2020-06-21 ENCOUNTER — Ambulatory Visit
Admission: RE | Admit: 2020-06-21 | Discharge: 2020-06-21 | Disposition: A | Discharge status: Home / Self Care | Payer: PRIVATE HEALTH INSURANCE | Source: Home / Self Care | Attending: Family Medicine | Admitting: Family Medicine

## 2020-06-21 ENCOUNTER — Other Ambulatory Visit: Payer: Self-pay

## 2020-06-21 DIAGNOSIS — G8929 Other chronic pain: Secondary | ICD-10-CM | POA: Diagnosis present

## 2020-06-21 DIAGNOSIS — M25551 Pain in right hip: Secondary | ICD-10-CM | POA: Diagnosis present

## 2020-06-22 ENCOUNTER — Ambulatory Visit: Payer: PRIVATE HEALTH INSURANCE | Admitting: Rheumatology

## 2020-06-22 ENCOUNTER — Encounter: Payer: Self-pay | Admitting: Rheumatology

## 2020-06-22 VITALS — BP 139/92 | HR 66 | Resp 16 | Ht 69.0 in | Wt 252.0 lb

## 2020-06-22 DIAGNOSIS — M7061 Trochanteric bursitis, right hip: Secondary | ICD-10-CM | POA: Diagnosis not present

## 2020-06-22 DIAGNOSIS — G5761 Lesion of plantar nerve, right lower limb: Secondary | ICD-10-CM

## 2020-06-22 DIAGNOSIS — F418 Other specified anxiety disorders: Secondary | ICD-10-CM

## 2020-06-22 DIAGNOSIS — M533 Sacrococcygeal disorders, not elsewhere classified: Secondary | ICD-10-CM

## 2020-06-22 DIAGNOSIS — M25571 Pain in right ankle and joints of right foot: Secondary | ICD-10-CM

## 2020-06-22 DIAGNOSIS — M1712 Unilateral primary osteoarthritis, left knee: Secondary | ICD-10-CM

## 2020-06-22 DIAGNOSIS — Z840 Family history of diseases of the skin and subcutaneous tissue: Secondary | ICD-10-CM

## 2020-06-22 DIAGNOSIS — G8929 Other chronic pain: Secondary | ICD-10-CM

## 2020-06-22 DIAGNOSIS — M79641 Pain in right hand: Secondary | ICD-10-CM

## 2020-06-22 DIAGNOSIS — M25511 Pain in right shoulder: Secondary | ICD-10-CM

## 2020-06-22 DIAGNOSIS — M2550 Pain in unspecified joint: Secondary | ICD-10-CM

## 2020-06-22 DIAGNOSIS — C44319 Basal cell carcinoma of skin of other parts of face: Secondary | ICD-10-CM

## 2020-06-22 DIAGNOSIS — M7918 Myalgia, other site: Secondary | ICD-10-CM

## 2020-06-22 DIAGNOSIS — M7062 Trochanteric bursitis, left hip: Secondary | ICD-10-CM

## 2020-06-22 DIAGNOSIS — J45990 Exercise induced bronchospasm: Secondary | ICD-10-CM

## 2020-06-22 DIAGNOSIS — E038 Other specified hypothyroidism: Secondary | ICD-10-CM

## 2020-06-22 DIAGNOSIS — M545 Low back pain, unspecified: Secondary | ICD-10-CM

## 2020-06-22 DIAGNOSIS — L988 Other specified disorders of the skin and subcutaneous tissue: Secondary | ICD-10-CM

## 2020-06-22 DIAGNOSIS — Z8719 Personal history of other diseases of the digestive system: Secondary | ICD-10-CM

## 2020-06-22 NOTE — Patient Instructions (Signed)
Iliotibial Band Syndrome Rehab Ask your health care provider which exercises are safe for you. Do exercises exactly as told by your health care provider and adjust them as directed. It is normal to feel mild stretching, pulling, tightness, or discomfort as you do these exercises. Stop right away if you feel sudden pain or your pain gets significantly worse. Do not begin these exercises until told by your health care provider. Stretching and range-of-motion exercises These exercises warm up your muscles and joints and improve the movement and flexibility of your hip and pelvis. Quadriceps stretch, prone 1. Lie on your abdomen (prone position) on a firm surface, such as a bed or padded floor. 2. Bend your left / right knee and reach back to hold your ankle or pant leg. If you cannot reach your ankle or pant leg, loop a belt around your foot and grab the belt instead. 3. Gently pull your heel toward your buttocks. Your knee should not slide out to the side. You should feel a stretch in the front of your thigh and knee (quadriceps). 4. Hold this position for __________ seconds. Repeat __________ times. Complete this exercise __________ times a day.   Iliotibial band stretch An iliotibial band is a strong band of muscle tissue that runs from the outer side of your hip to the outer side of your thigh and knee. 1. Lie on your side with your left / right leg in the top position. 2. Bend both of your knees and grab your left / right ankle. Stretch out your bottom arm to help you balance. 3. Slowly bring your top knee back so your thigh goes behind your trunk. 4. Slowly lower your top leg toward the floor until you feel a gentle stretch on the outside of your left / right hip and thigh. If you do not feel a stretch and your knee will not fall farther, place the heel of your other foot on top of your knee and pull your knee down toward the floor with your foot. 5. Hold this position for __________  seconds. Repeat __________ times. Complete this exercise __________ times a day.   Strengthening exercises These exercises build strength and endurance in your hip and pelvis. Endurance is the ability to use your muscles for a long time, even after they get tired. Straight leg raises, side-lying This exercise strengthens the muscles that rotate the leg at the hip and move it away from your body (hip abductors). 1. Lie on your side with your left / right leg in the top position. Lie so your head, shoulder, hip, and knee line up. You may bend your bottom knee to help you balance. 2. Roll your hips slightly forward so your hips are stacked directly over each other and your left / right knee is facing forward. 3. Tense the muscles in your outer thigh and lift your top leg 4-6 inches (10-15 cm). 4. Hold this position for __________ seconds. 5. Slowly lower your leg to return to the starting position. Let your muscles relax completely before doing another repetition. Repeat __________ times. Complete this exercise __________ times a day.   Leg raises, prone This exercise strengthens the muscles that move the hips backward (hip extensors). 1. Lie on your abdomen (prone position) on your bed or a firm surface. You can put a pillow under your hips if that is more comfortable for your lower back. 2. Bend your left / right knee so your foot is straight up in the air.  3. Squeeze your buttocks muscles and lift your left / right thigh off the bed. Do not let your back arch. 4. Tense your thigh muscle as hard as you can without increasing any knee pain. 5. Hold this position for __________ seconds. 6. Slowly lower your leg to return to the starting position and allow it to relax completely. Repeat __________ times. Complete this exercise __________ times a day. Hip hike 1. Stand sideways on a bottom step. Stand on your left / right leg with your other foot unsupported next to the step. You can hold on to a  railing or wall for balance if needed. 2. Keep your knees straight and your torso square. Then lift your left / right hip up toward the ceiling. 3. Slowly let your left / right hip lower toward the floor, past the starting position. Your foot should get closer to the floor. Do not lean or bend your knees. Repeat __________ times. Complete this exercise __________ times a day. This information is not intended to replace advice given to you by your health care provider. Make sure you discuss any questions you have with your health care provider. Document Revised: 05/20/2019 Document Reviewed: 05/20/2019 Elsevier Patient Education  2021 [place]. Back Exercises The following exercises strengthen the muscles that help to support the trunk and back. They also help to keep the lower back flexible. Doing these exercises can help to prevent back pain or lessen existing pain.  If you have back pain or discomfort, try doing these exercises 2-3 times each day or as told by your health care provider.  As your pain improves, do them once each day, but increase the number of times that you repeat the steps for each exercise (do more repetitions).  To prevent the recurrence of back pain, continue to do these exercises once each day or as told by your health care provider. Do exercises exactly as told by your health care provider and adjust them as directed. It is normal to feel mild stretching, pulling, tightness, or discomfort as you do these exercises, but you should stop right away if you feel sudden pain or your pain gets worse. Exercises Single knee to chest Repeat these steps 3-5 times for each leg: 6. Lie on your back on a firm bed or the floor with your legs extended. 7. Bring one knee to your chest. Your other leg should stay extended and in contact with the floor. 8. Hold your knee in place by grabbing your knee or thigh with both hands and hold. 9. Pull on your knee until you feel a gentle  stretch in your lower back or buttocks. 10. Hold the stretch for 10-30 seconds. 11. Slowly release and straighten your leg. Pelvic tilt Repeat these steps 5-10 times: 6. Lie on your back on a firm bed or the floor with your legs extended. 7. Bend your knees so they are pointing toward the ceiling and your feet are flat on the floor. 8. Tighten your lower abdominal muscles to press your lower back against the floor. This motion will tilt your pelvis so your tailbone points up toward the ceiling instead of pointing to your feet or the floor. 9. With gentle tension and even breathing, hold this position for 5-10 seconds. Cat-cow Repeat these steps until your lower back becomes more flexible: 7. Get into a hands-and-knees position on a firm surface. Keep your hands under your shoulders, and keep your knees under your hips. You may place padding under  your knees for comfort. 8. Let your head hang down toward your chest. Contract your abdominal muscles and point your tailbone toward the floor so your lower back becomes rounded like the back of a cat. 9. Hold this position for 5 seconds. 10. Slowly lift your head, let your abdominal muscles relax and point your tailbone up toward the ceiling so your back forms a sagging arch like the back of a cow. 11. Hold this position for 5 seconds.   Press-ups Repeat these steps 5-10 times: 4. Lie on your abdomen (face-down) on the floor. 5. Place your palms near your head, about shoulder-width apart. 6. Keeping your back as relaxed as possible and keeping your hips on the floor, slowly straighten your arms to raise the top half of your body and lift your shoulders. Do not use your back muscles to raise your upper torso. You may adjust the placement of your hands to make yourself more comfortable. 7. Hold this position for 5 seconds while you keep your back relaxed. 8. Slowly return to lying flat on the floor.   Bridges Repeat these steps 10 times: 1. Lie on  your back on a firm surface. 2. Bend your knees so they are pointing toward the ceiling and your feet are flat on the floor. Your arms should be flat at your sides, next to your body. 3. Tighten your buttocks muscles and lift your buttocks off the floor until your waist is at almost the same height as your knees. You should feel the muscles working in your buttocks and the back of your thighs. If you do not feel these muscles, slide your feet 1-2 inches farther away from your buttocks. 4. Hold this position for 3-5 seconds. 5. Slowly lower your hips to the starting position, and allow your buttocks muscles to relax completely. If this exercise is too easy, try doing it with your arms crossed over your chest.   Abdominal crunches Repeat these steps 5-10 times: 1. Lie on your back on a firm bed or the floor with your legs extended. 2. Bend your knees so they are pointing toward the ceiling and your feet are flat on the floor. 3. Cross your arms over your chest. 4. Tip your chin slightly toward your chest without bending your neck. 5. Tighten your abdominal muscles and slowly raise your trunk (torso) high enough to lift your shoulder blades a tiny bit off the floor. Avoid raising your torso higher than that because it can put too much stress on your low back and does not help to strengthen your abdominal muscles. 6. Slowly return to your starting position. Back lifts Repeat these steps 5-10 times: 1. Lie on your abdomen (face-down) with your arms at your sides, and rest your forehead on the floor. 2. Tighten the muscles in your legs and your buttocks. 3. Slowly lift your chest off the floor while you keep your hips pressed to the floor. Keep the back of your head in line with the curve in your back. Your eyes should be looking at the floor. 4. Hold this position for 3-5 seconds. 5. Slowly return to your starting position. Contact a health care provider if:  Your back pain or discomfort gets much  worse when you do an exercise.  Your worsening back pain or discomfort does not lessen within 2 hours after you exercise. If you have any of these problems, stop doing these exercises right away. Do not do them again unless your health care provider  says that you can. Get help right away if:  You develop sudden, severe back pain. If this happens, stop doing the exercises right away. Do not do them again unless your health care provider says that you can. This information is not intended to replace advice given to you by your health care provider. Make sure you discuss any questions you have with your health care provider. Document Revised: 07/17/2018 Document Reviewed: 12/12/2017 Elsevier Patient Education  [place].

## 2020-06-27 ENCOUNTER — Ambulatory Visit: Payer: PRIVATE HEALTH INSURANCE | Admitting: Cardiology

## 2020-06-28 ENCOUNTER — Encounter (INDEPENDENT_AMBULATORY_CARE_PROVIDER_SITE_OTHER): Payer: Self-pay | Admitting: Adult Health

## 2020-06-30 ENCOUNTER — Telehealth (INDEPENDENT_AMBULATORY_CARE_PROVIDER_SITE_OTHER): Payer: PRIVATE HEALTH INSURANCE | Admitting: Adult Health

## 2020-06-30 ENCOUNTER — Other Ambulatory Visit: Payer: Self-pay

## 2020-06-30 ENCOUNTER — Encounter (INDEPENDENT_AMBULATORY_CARE_PROVIDER_SITE_OTHER): Payer: Self-pay | Admitting: Adult Health

## 2020-06-30 DIAGNOSIS — E559 Vitamin D deficiency, unspecified: Secondary | ICD-10-CM

## 2020-06-30 DIAGNOSIS — J029 Acute pharyngitis, unspecified: Secondary | ICD-10-CM

## 2020-06-30 DIAGNOSIS — Z6836 Body mass index (BMI) 36.0-36.9, adult: Secondary | ICD-10-CM | POA: Diagnosis not present

## 2020-06-30 MED ORDER — VITAMIN D (ERGOCALCIFEROL) 1.25 MG (50000 UNIT) PO CAPS: 50000.0000 [IU] | ORAL_CAPSULE | ORAL | 0 refills | Status: DC

## 2020-07-04 NOTE — Progress Notes (Signed)
TeleHealth Visit:  Due to the COVID-19 pandemic, this visit was completed with telemedicine (audio/video) technology to reduce patient and provider exposure as well as to preserve personal protective equipment.   Caitlin Barrett has verbally consented to this TeleHealth visit. The patient is located at home, the provider is located at the [facility] office. The participants in this visit include the listed provider and patient. The visit was conducted today via video.   Chief Complaint: OBESITY Caitlin Barrett is here to discuss her progress with her obesity treatment plan along with follow-up of her obesity related diagnoses. Caitlin Barrett is on the Category 3 Plan and states she is following her eating plan approximately 85% of the time. Caitlin Barrett states she is not currently exercising.  Today's visit was #: 15 Starting weight: 277 lbs Starting date: 08/13/2019  Interim History: Caitlin Barrett is experiencing pharyngitis, fever (highest temp 100.37F oral), fatigue, dull headache. She tested negative on home antigen test and will test again today.  She feels that she has maintained her weight since last OV- home weight 242 lbs. Last in office weight 245 lbs on 06/09/2020.  Subjective:   1. Vitamin D deficiency Caitlin Barrett's Vitamin D level was 49.2 on 06/09/2020. She is currently taking prescription vitamin D 50,000 IU twice a week. She denies nausea, vomiting or muscle weakness. Due to subtherapeutic Vit D despite replacement, prescription Vit D was increased from 1 to 2 twice a week. Her Vit D level slightly improved.  2. Pharyngitis, unspecified etiology Caitlin Barrett is experiencing pharyngitis, fever (highest temp 100.37F oral), fatigue, dull headache. She tested negative on home antigen terst and will test again today.  Assessment/Plan:   1. Vitamin D deficiency Low Vitamin D level contributes to fatigue and are associated with obesity, breast, and colon cancer. She agrees to continue to take  prescription Vitamin D @50 ,000 IU twice a week and will follow-up for routine testing of Vitamin D, at least 2-3 times per year to avoid over-replacement. One more round at increased prescription Vit D dose.  - Vitamin D, Ergocalciferol, (DRISDOL) 1.25 MG (50000 UNIT) CAPS capsule; Take 1 capsule (50,000 Units total) by mouth every 3 (three) days.  Dispense: 8 capsule; Refill: 0  2. Pharyngitis, unspecified etiology Recommend 2nd home COVID antigen test. Increase fluids/rest and OTC acetaminophen.   3. Class 2 severe obesity with serious comorbidity and body mass index (BMI) of 36.0 to 36.9 in adult, unspecified obesity type Caitlin Barrett) Caitlin Barrett is currently in the action stage of change. As such, her goal is to continue with weight loss efforts. She has agreed to the Category 3 Plan.   Exercise goals: No exercise has been prescribed at this time.  Behavioral modification strategies: increasing lean protein intake, decreasing simple carbohydrates, increasing water intake and no skipping meals.  Caitlin Barrett has agreed to follow-up with our clinic in 4 weeks. She was informed of the importance of frequent follow-up visits to maximize her success with intensive lifestyle modifications for her multiple health conditions.  Objective:   VITALS: Per patient if applicable, see vitals. GENERAL: Alert and in no acute distress. CARDIOPULMONARY: No increased WOB. Speaking in clear sentences.  PSYCH: Pleasant and cooperative. Speech normal rate and rhythm. Affect is appropriate. Insight and judgement are appropriate. Attention is focused, linear, and appropriate.  NEURO: Oriented as arrived to appointment on time with no prompting.   Lab Results  Component Value Date   CREATININE 0.66 06/09/2020   BUN 11 06/09/2020   NA 140 06/09/2020   K  4.4 06/09/2020   CL 101 06/09/2020   CO2 19 (L) 06/09/2020   Lab Results  Component Value Date   ALT 23 06/09/2020   AST 21 06/09/2020   ALKPHOS 111 06/09/2020    BILITOT 0.5 06/09/2020   Lab Results  Component Value Date   HGBA1C 5.3 03/31/2020   HGBA1C 5.5 12/15/2019   HGBA1C 5.3 08/13/2019   HGBA1C 5.3 10/23/2017   HGBA1C 5.0 10/01/2016   Lab Results  Component Value Date   INSULIN 8.9 06/09/2020   INSULIN 12.4 03/31/2020   INSULIN 13.5 12/15/2019   INSULIN 19.0 08/13/2019   Lab Results  Component Value Date   TSH 0.081 (L) 08/13/2019   Lab Results  Component Value Date   CHOL 246 (H) 06/09/2020   HDL 63 06/09/2020   LDLCALC 168 (H) 06/09/2020   TRIG 89 06/09/2020   CHOLHDL 3.9 06/09/2020   Lab Results  Component Value Date   WBC 9.8 03/31/2020   HGB 14.2 03/31/2020   HCT 40.9 03/31/2020   MCV 88 03/31/2020   PLT 394 03/31/2020   Lab Results  Component Value Date   IRON 64 08/13/2019   TIBC 333 08/13/2019   FERRITIN 30 08/13/2019    Attestation Statements:   Reviewed by clinician on day of visit: allergies, medications, problem list, medical history, surgical history, family history, social history, and previous encounter notes.  Time spent on visit including pre-visit chart review and post-visit charting and care was 30 minutes.   Coral Ceo, am acting as Location manager for Mina Marble, NP.  I have reviewed the above documentation for accuracy and completeness, and I agree with the above. - Jahnya Trindade d. Schelly Chuba, NP-C

## 2020-07-05 ENCOUNTER — Encounter: Payer: PRIVATE HEALTH INSURANCE | Admitting: Certified Nurse Midwife

## 2020-07-05 ENCOUNTER — Encounter: Payer: Self-pay | Admitting: Obstetrics and Gynecology

## 2020-07-05 ENCOUNTER — Other Ambulatory Visit: Payer: Self-pay

## 2020-07-05 ENCOUNTER — Ambulatory Visit (INDEPENDENT_AMBULATORY_CARE_PROVIDER_SITE_OTHER): Payer: PRIVATE HEALTH INSURANCE | Admitting: Obstetrics and Gynecology

## 2020-07-05 VITALS — BP 133/88 | HR 77 | Ht 69.0 in | Wt 254.9 lb

## 2020-07-05 DIAGNOSIS — N951 Menopausal and female climacteric states: Secondary | ICD-10-CM | POA: Diagnosis not present

## 2020-07-05 DIAGNOSIS — Z1231 Encounter for screening mammogram for malignant neoplasm of breast: Secondary | ICD-10-CM

## 2020-07-05 DIAGNOSIS — E785 Hyperlipidemia, unspecified: Secondary | ICD-10-CM

## 2020-07-05 DIAGNOSIS — Z1211 Encounter for screening for malignant neoplasm of colon: Secondary | ICD-10-CM | POA: Diagnosis not present

## 2020-07-05 DIAGNOSIS — Z01419 Encounter for gynecological examination (general) (routine) without abnormal findings: Secondary | ICD-10-CM

## 2020-07-05 DIAGNOSIS — Z124 Encounter for screening for malignant neoplasm of cervix: Secondary | ICD-10-CM

## 2020-07-05 DIAGNOSIS — E038 Other specified hypothyroidism: Secondary | ICD-10-CM

## 2020-07-05 DIAGNOSIS — N952 Postmenopausal atrophic vaginitis: Secondary | ICD-10-CM

## 2020-07-05 NOTE — Progress Notes (Signed)
GYNECOLOGY ANNUAL PHYSICAL EXAM PROGRESS NOTE  Subjective:    Caitlin Barrett is a 50 y.o. G0P0000 menopausal female who presents for an annual exam. The patient has complaints of intermittent vaginal burning, likley due to dryness.  She is using coconut oil which helps. The patient participates in regular exercise: no. Has the patient ever been transfused or tattooed?: no.    Gynecologic History:  Patient's last menstrual period was 05/26/2016 (exact date). Contraception: status post hysterectomy History of STI's: Denies Last Pap: 06/29/2019. Results were: normal.  LGSIL (vagina). S/p normal colposcopy. Denies/Notes h/o abnormal pap smears. Last mammogram: 02/05/2018. Results were: normal   OB History  Gravida Para Term Preterm AB Living  0 0 0 0 0 0  SAB IAB Ectopic Multiple Live Births  0 0 0 0 0    Past Medical History:  Diagnosis Date  . Allergy    seasonal.  Takes allergy shots at [facility]  . Anemia   . Anxiety   . Arthritis   . Asthma    exercise induced--no inhalers  . Back pain   . Cancer ([place]) 2010   Basal cell skin cancer Lt eye, skin graft above Lt eye  . Constipation   . Costochondritis   . Depression   . Dysmenorrhea   . Eczema   . Edema, lower extremity   . GERD (gastroesophageal reflux disease)   . Headache   . Hypothyroid    Nodules and Fine needle aspiration  . IBS (irritable bowel syndrome)   . Lab test positive for detection of COVID-19 virus    November 2020  . Myalgia   . Osteoarthritis   . Painful menstrual periods   . Palpitations   . Plantar fasciitis   . Shortness of breath   . Sprain of carpal (joint) of wrist   . Vitamin B 12 deficiency   . Vitamin D deficiency     Past Surgical History:  Procedure Laterality Date  . ABDOMINAL HYSTERECTOMY Bilateral 07/02/2016   Procedure: HYSTERECTOMY ABDOMINAL WITH BILATERAL SALPINGECTOMY-MIDLINE INCISION;  Surgeon: Brayton Mars, MD;  Location: [facility] OR[facility];  Service: Gynecology;   Laterality: Bilateral;  . CHOLECYSTECTOMY  2009  . fibromalga    . HERNIA REPAIR  3009   umbilical  . SKIN GRAFT  2010  . TONSILLECTOMY      Family History  Problem Relation Age of Onset  . Hyperlipidemia Mother   . Hypertension Mother   . Sleep apnea Mother   . Obesity Mother   . Cancer Father        lung CA  . Heart disease Maternal Grandmother   . Stroke Maternal Grandmother   . Hypertension Maternal Grandmother   . Diabetes Maternal Grandmother   . Heart disease Maternal Grandfather   . Diabetes Maternal Grandfather   . Heart attack Maternal Grandfather     Social History   Socioeconomic History  . Marital status: Married    Spouse name: Not on file  . Number of children: Not on file  . Years of education: Not on file  . Highest education level: Not on file  Occupational History  . Occupation: Games developer  Tobacco Use  . Smoking status: Never Smoker  . Smokeless tobacco: Never Used  Vaping Use  . Vaping Use: Never used  Substance and Sexual Activity  . Alcohol use: Yes    Comment: rare  . Drug use: No  . Sexual activity: Yes    Birth control/protection:  Surgical  Other Topics Concern  . Not on file  Social History Narrative  . Not on file   Social Determinants of Health   Financial Resource Strain: Not on file  Food Insecurity: Not on file  Transportation Needs: Not on file  Physical Activity: Not on file  Stress: Not on file  Social Connections: Not on file  Intimate Partner Violence: Not on file    Current Outpatient Medications on File Prior to Visit  Medication Sig Dispense Refill  . albuterol (VENTOLIN HFA) 108 (90 Base) MCG/ACT inhaler TAKE 2 PUFFS BY MOUTH EVERY 6 HOURS AS NEEDED FOR WHEEZE OR SHORTNESS OF BREATH 18 each 2  . clobetasol cream (TEMOVATE) 0.93 % Apply 1 application topically 2 (two) times daily. For up to 1-2 weeks then as needed. Do not use on sensitive skin. 30 g 1  . COLLAGEN-BORON-HYALURONIC ACID PO Take by  mouth.    . cyclobenzaprine (FLEXERIL) 10 MG tablet Take 1 tablet (10 mg total) by mouth at bedtime as needed for muscle spasms. 30 tablet 2  . diphenhydrAMINE (BENADRYL) 25 mg capsule Take 25 mg by mouth every 6 (six) hours as needed.    Marland Kitchen EPINEPHrine 0.3 mg/0.3 mL IJ SOAJ injection 0.3 mg as needed.     Marland Kitchen estradiol (ESTRACE) 1 MG tablet Take 1 tablet (1 mg total) by mouth daily. 90 tablet 4  . Loratadine 10 MG CAPS Take by mouth.    . methocarbamol (ROBAXIN) 500 MG tablet Take 1-2 tablets (500-1,000 mg total) by mouth daily as needed for muscle spasms. 60 tablet 2  . Misc Natural Products (OSTEO BI-FLEX ADV TRIPLE ST PO) Take by mouth.    . Multiple Vitamins-Minerals (MULTIVITAMIN WOMEN PO) Take by mouth.    Marland Kitchen omeprazole (PRILOSEC) 40 MG capsule Take 40 mg by mouth as needed.    . ondansetron (ZOFRAN) 4 MG tablet Take by mouth as needed.     Marland Kitchen oxymetazoline (AFRIN) 0.05 % nasal spray Place 1 spray into both nostrils as needed.     . Probiotic Product (TRUBIOTICS) CAPS Take 1 capsule by mouth daily.    . progesterone (PROMETRIUM) 200 MG capsule TAKE 1 CAPSULE BY MOUTH EVERY DAY 90 capsule 0  . triamcinolone cream (KENALOG) 0.1 % APPLY THIN FILM TWICE DAILY TO ECZEMA UNTIL CLEAR    . TURMERIC PO Take by mouth daily.    . Vitamin D, Ergocalciferol, (DRISDOL) 1.25 MG (50000 UNIT) CAPS capsule Take 1 capsule (50,000 Units total) by mouth every 3 (three) days. 8 capsule 0   No current facility-administered medications on file prior to visit.    Allergies  Allergen Reactions  . Contrast Media [Iodinated Diagnostic Agents]   . Codeine Nausea Only  . Other Hives    Uncoded Allergy. Allergen: contrast dye Uncoded Allergy. Allergen: contrast dye      Review of Systems Constitutional: negative for chills, fatigue, fevers and sweats Eyes: negative for irritation, redness and visual disturbance Ears, nose, mouth, throat, and face: negative for hearing loss, nasal congestion, snoring and  tinnitus Respiratory: negative for asthma, cough, sputum Cardiovascular: negative for chest pain, dyspnea, exertional chest pressure/discomfort, irregular heart beat, palpitations and syncope Gastrointestinal: negative for abdominal pain, change in bowel habits, nausea and vomiting Genitourinary: negative for abnormal menstrual periods, genital lesions, sexual problems and vaginal discharge, dysuria and urinary incontinence. Positive for vaginal burning.  Integument/breast: negative for breast lump, breast tenderness and nipple discharge Hematologic/lymphatic: negative for bleeding and easy bruising Musculoskeletal:negative for back pain  and muscle weakness Neurological: negative for dizziness, headaches, vertigo and weakness Endocrine: negative for diabetic symptoms including polydipsia, polyuria and skin dryness Allergic/Immunologic: negative for hay fever and urticaria        Objective:  Blood pressure 133/88, pulse 77, height 5' 9"  (1.753 m), weight 254 lb 14.4 oz (115.6 kg), last menstrual period 05/26/2016. Body mass index is 37.64 kg/m.  General Appearance:    Alert, cooperative, no distress, appears stated age, moderate obesity  Head:    Normocephalic, without obvious abnormality, atraumatic  Eyes:    PERRL, conjunctiva/corneas clear, EOM's intact, both eyes  Ears:    Normal external ear canals, both ears  Nose:   Nares normal, septum midline, mucosa normal, no drainage or sinus tenderness  Throat:   Lips, mucosa, and tongue normal; teeth and gums normal  Neck:   Supple, symmetrical, trachea midline, no adenopathy; thyroid: no enlargement/tenderness/nodules; no carotid bruit or JVD  Back:     Symmetric, no curvature, ROM normal, no CVA tenderness  Lungs:     Clear to auscultation bilaterally, respirations unlabored  Chest Wall:    No tenderness or deformity   Heart:    Regular rate and rhythm, S1 and S2 normal, no murmur, rub or gallop  Breast Exam:    No tenderness, masses, or  nipple abnormality  Abdomen:     Soft, non-tender, bowel sounds active all four quadrants, no masses, no organomegaly.    Genitalia:    Pelvic:external genitalia normal, vagina without lesions, discharge, or tenderness, mild atrophy present.  Rectovaginal septum  normal. Cervix normal in appearance, no cervical motion tenderness, no adnexal masses or tenderness.  Uterus normal size, shape, mobile, regular contours, nontender.  Rectal:    Normal external sphincter.  No hemorrhoids appreciated. Internal exam not done.   Extremities:   Extremities normal, atraumatic, no cyanosis or edema  Pulses:   2+ and symmetric all extremities  Skin:   Skin color, texture, turgor normal, no rashes or lesions  Lymph nodes:   Cervical, supraclavicular, and axillary nodes normal  Neurologic:   CNII-XII intact, normal strength, sensation and reflexes throughout   .  Labs:  Lab Results  Component Value Date   WBC 9.8 03/31/2020   HGB 14.2 03/31/2020   HCT 40.9 03/31/2020   MCV 88 03/31/2020   PLT 394 03/31/2020    Lab Results  Component Value Date   CREATININE 0.66 06/09/2020   BUN 11 06/09/2020   NA 140 06/09/2020   K 4.4 06/09/2020   CL 101 06/09/2020   CO2 19 (L) 06/09/2020    Lab Results  Component Value Date   ALT 23 06/09/2020   AST 21 06/09/2020   ALKPHOS 111 06/09/2020   BILITOT 0.5 06/09/2020    Lab Results  Component Value Date   TSH 0.081 (L) 08/13/2019    Lab Results  Component Value Date   CHOL 246 (H) 06/09/2020   HDL 63 06/09/2020   LDLCALC 168 (H) 06/09/2020   TRIG 89 06/09/2020   CHOLHDL 3.9 06/09/2020     Assessment:   1. Encounter for well woman exam with routine gynecological exam   2. Colon cancer screening   3. Breast cancer screening by mammogram   4. Menopausal vasomotor syndrome   5. Pap smear for cervical cancer screening   6. Central hypothyroidism   7. Vaginal atrophy    Plan:     Blood tests: labs up to date.  Breast self exam technique  reviewed and patient  encouraged to perform self-exam monthly. Contraception: status post hysterectomy. Discussed healthy lifestyle modifications. Mammogram ordered.  Pap smear no longer needed.  Had recent LSIL pap of vagina with no prior high grade cervical dysplasia history.  No further paps needed.  Vaginal burning likely secondary to menopause and atrophy, using coconut oil. Offered trial of local estrogen therapy but patient notes she is fine with using the oil.  Vasomotor symptoms managed with estradial and prometrium. Discussed risks of use of long-term hormonal therapy use.  Hypothyroidism managed by PCP. COVID vaccination status: has completed.    Rubie Maid, MD [facility]

## 2020-07-05 NOTE — Progress Notes (Signed)
Annual Exam-Pt stated having itchy nipple and vaginal burning. Mammogram order placed. Pt had COVID vaccines and will send dates via mychart.

## 2020-07-05 NOTE — Patient Instructions (Signed)
Preventive Care 84-48 Years Old, Female Preventive care refers to lifestyle choices and visits with your health care provider that can promote health and wellness. This includes:  A yearly physical exam. This is also called an annual wellness visit.  Regular dental and eye exams.  Immunizations.  Screening for certain conditions.  Healthy lifestyle choices, such as: ? Eating a healthy diet. ? Getting regular exercise. ? Not using drugs or products that contain nicotine and tobacco. ? Limiting alcohol use. What can I expect for my preventive care visit? Physical exam Your health care provider will check your:  Height and weight. These may be used to calculate your BMI (body mass index). BMI is a measurement that tells if you are at a healthy weight.  Heart rate and blood pressure.  Body temperature.  Skin for abnormal spots. Counseling Your health care provider may ask you questions about your:  Past medical problems.  Family's medical history.  Alcohol, tobacco, and drug use.  Emotional well-being.  Home life and relationship well-being.  Sexual activity.  Diet, exercise, and sleep habits.  Work and work Statistician.  Access to firearms.  Method of birth control.  Menstrual cycle.  Pregnancy history. What immunizations do I need? Vaccines are usually given at various ages, according to a schedule. Your health care provider will recommend vaccines for you based on your age, medical history, and lifestyle or other factors, such as travel or where you work.   What tests do I need? Blood tests  Lipid and cholesterol levels. These may be checked every 5 years, or more often if you are over 3 years old.  Hepatitis C test.  Hepatitis B test. Screening  Lung cancer screening. You may have this screening every year starting at age 73 if you have a 30-pack-year history of smoking and currently smoke or have quit within the past 15 years.  Colorectal cancer  screening. ? All adults should have this screening starting at age 52 and continuing until age 17. ? Your health care provider may recommend screening at age 49 if you are at increased risk. ? You will have tests every 1-10 years, depending on your results and the type of screening test.  Diabetes screening. ? This is done by checking your blood sugar (glucose) after you have not eaten for a while (fasting). ? You may have this done every 1-3 years.  Mammogram. ? This may be done every 1-2 years. ? Talk with your health care provider about when you should start having regular mammograms. This may depend on whether you have a family history of breast cancer.  BRCA-related cancer screening. This may be done if you have a family history of breast, ovarian, tubal, or peritoneal cancers.  Pelvic exam and Pap test. ? This may be done every 3 years starting at age 10. ? Starting at age 11, this may be done every 5 years if you have a Pap test in combination with an HPV test. Other tests  STD (sexually transmitted disease) testing, if you are at risk.  Bone density scan. This is done to screen for osteoporosis. You may have this scan if you are at high risk for osteoporosis. Talk with your health care provider about your test results, treatment options, and if necessary, the need for more tests. Follow these instructions at home: Eating and drinking  Eat a diet that includes fresh fruits and vegetables, whole grains, lean protein, and low-fat dairy products.  Take vitamin and mineral supplements  as recommended by your health care provider.  Do not drink alcohol if: ? Your health care provider tells you not to drink. ? You are pregnant, may be pregnant, or are planning to become pregnant.  If you drink alcohol: ? Limit how much you have to 0-1 drink a day. ? Be aware of how much alcohol is in your drink. In the [place] one drink equals one 12 oz bottle of beer (355 mL), one 5 oz glass of  wine (148 mL), or one 1 oz glass of hard liquor (44 mL).   Lifestyle  Take daily care of your teeth and gums. Brush your teeth every morning and night with fluoride toothpaste. Floss one time each day.  Stay active. Exercise for at least 30 minutes 5 or more days each week.  Do not use any products that contain nicotine or tobacco, such as cigarettes, e-cigarettes, and chewing tobacco. If you need help quitting, ask your health care provider.  Do not use drugs.  If you are sexually active, practice safe sex. Use a condom or other form of protection to prevent STIs (sexually transmitted infections).  If you do not wish to become pregnant, use a form of birth control. If you plan to become pregnant, see your health care provider for a prepregnancy visit.  If told by your health care provider, take low-dose aspirin daily starting at age 75.  Find healthy ways to cope with stress, such as: ? Meditation, yoga, or listening to music. ? Journaling. ? Talking to a trusted person. ? Spending time with friends and family. Safety  Always wear your seat belt while driving or riding in a vehicle.  Do not drive: ? If you have been drinking alcohol. Do not ride with someone who has been drinking. ? When you are tired or distracted. ? While texting.  Wear a helmet and other protective equipment during sports activities.  If you have firearms in your house, make sure you follow all gun safety procedures. What's next?  Visit your health care provider once a year for an annual wellness visit.  Ask your health care provider how often you should have your eyes and teeth checked.  Stay up to date on all vaccines. This information is not intended to replace advice given to you by your health care provider. Make sure you discuss any questions you have with your health care provider. Document Revised: 12/15/2019 Document Reviewed: 11/21/2017 Elsevier Patient Education  2021 [place] Breast self-awareness is knowing how your breasts look and feel. Doing breast self-awareness is important. It allows you to catch a breast problem early while it is still small and can be treated. All women should do breast self-awareness, including women who have had breast implants. Tell your doctor if you notice a change in your breasts. What you need:  A mirror.  A well-lit room. How to do a breast self-exam A breast self-exam is one way to learn what is normal for your breasts and to check for changes. To do a breast self-exam: Look for changes 1. Take off all the clothes above your waist. 2. Stand in front of a mirror in a room with good lighting. 3. Put your hands on your hips. 4. Push your hands down. 5. Look at your breasts and nipples in the mirror to see if one breast or nipple looks different from the other. Check to see if: ? The shape of one breast is different. ? The size of  one breast is different. ? There are wrinkles, dips, and bumps in one breast and not the other. 6. Look at each breast for changes in the skin, such as: ? Redness. ? Scaly areas. 7. Look for changes in your nipples, such as: ? Liquid around the nipples. ? Bleeding. ? Dimpling. ? Redness. ? A change in where the nipples are.   Feel for changes 1. Lie on your back on the floor. 2. Feel each breast. To do this, follow these steps: ? Pick a breast to feel. ? Put the arm closest to that breast above your head. ? Use your other arm to feel the nipple area of your breast. Feel the area with the pads of your three middle fingers by making small circles with your fingers. For the first circle, press lightly. For the second circle, press harder. For the third circle, press even harder. ? Keep making circles with your fingers at the different pressures as you move down your breast. Stop when you feel your ribs. ? Move your fingers a little toward the center of your body. ? Start making  circles with your fingers again, this time going up until you reach your collarbone. ? Keep making up-and-down circles until you reach your armpit. Remember to keep using the three pressures. ? Feel the other breast in the same way. 3. Sit or stand in the tub or shower. 4. With soapy water on your skin, feel each breast the same way you did in step 2 when you were lying on the floor.   Write down what you find Writing down what you find can help you remember what to tell your doctor. Write down:  What is normal for each breast.  Any changes you find in each breast, including: ? The kind of changes you find. ? Whether you have pain. ? Size and location of any lumps.  When you last had your menstrual period. General tips  Check your breasts every month.  If you are breastfeeding, the best time to check your breasts is after you feed your baby or after you use a breast pump.  If you get menstrual periods, the best time to check your breasts is 5-7 days after your menstrual period is over.  With time, you will become comfortable with the self-exam, and you will begin to know if there are changes in your breasts. Contact a doctor if you:  See a change in the shape or size of your breasts or nipples.  See a change in the skin of your breast or nipples, such as red or scaly skin.  Have fluid coming from your nipples that is not normal.  Find a lump or thick area that was not there before.  Have pain in your breasts.  Have any concerns about your breast health. Summary  Breast self-awareness includes looking for changes in your breasts, as well as feeling for changes within your breasts.  Breast self-awareness should be done in front of a mirror in a well-lit room.  You should check your breasts every month. If you get menstrual periods, the best time to check your breasts is 5-7 days after your menstrual period is over.  Let your doctor know of any changes you see in your  breasts, including changes in size, changes on the skin, pain or tenderness, or fluid from your nipples that is not normal. This information is not intended to replace advice given to you by your health care provider. Make sure  you discuss any questions you have with your health care provider. Document Revised: 10/29/2017 Document Reviewed: 10/29/2017 Elsevier Patient Education  [place].

## 2020-07-09 ENCOUNTER — Encounter: Payer: Self-pay | Admitting: Obstetrics and Gynecology

## 2020-07-18 ENCOUNTER — Encounter: Payer: Self-pay | Admitting: Cardiology

## 2020-07-18 ENCOUNTER — Ambulatory Visit: Payer: PRIVATE HEALTH INSURANCE | Admitting: Cardiology

## 2020-07-18 ENCOUNTER — Other Ambulatory Visit: Payer: Self-pay

## 2020-07-18 VITALS — BP 118/88 | HR 91 | Ht 68.5 in | Wt 251.0 lb

## 2020-07-18 DIAGNOSIS — Z6837 Body mass index (BMI) 37.0-37.9, adult: Secondary | ICD-10-CM | POA: Diagnosis not present

## 2020-07-18 DIAGNOSIS — E7800 Pure hypercholesterolemia, unspecified: Secondary | ICD-10-CM | POA: Diagnosis not present

## 2020-07-18 DIAGNOSIS — R072 Precordial pain: Secondary | ICD-10-CM
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
## 2020-07-18 NOTE — Patient Instructions (Signed)
Medication Instructions:   1. Your physician recommends that you continue on your current medications as directed. Please refer to the Current Medication list given to you today.  *If you need a refill on your cardiac medications before your next appointment, please call your pharmacy*   Lab Work:  1. None Ordered   If you have labs (blood work) drawn today and your tests are completely normal, you will receive your results only by: Marland Kitchen MyChart Message (if you have MyChart) OR . A paper copy in the mail If you have any lab test that is abnormal or we need to change your treatment, we will call you to review the results.   Testing/Procedures:  1. None Ordered   Follow-Up: At [facility], you and your health needs are our priority.  As part of our continuing mission to provide you with exceptional heart care, we have created designated Provider Care Teams.  These Care Teams include your primary Cardiologist (physician) and Advanced Practice Providers (APPs -  Physician Assistants and Nurse Practitioners) who all work together to provide you with the care you need, when you need it.  We recommend signing up for the patient portal called "MyChart".  Sign up information is provided on this After Visit Summary.  MyChart is used to connect with patients for Virtual Visits (Telemedicine).  Patients are able to view lab/test results, encounter notes, upcoming appointments, etc.  Non-urgent messages can be sent to your provider as well.   To learn more about what you can do with MyChart, go to [email or web address].    Your next appointment:   12 month(s)  The format for your next appointment:   In Person  Provider:   You may see Kate Sable, MD or one of the following Advanced Practice Providers on your designated Care Team:    Murray Hodgkins, NP  Christell Faith, PA-C  Marrianne Mood, PA-C  Cadence [place], [place]

## 2020-07-25 ENCOUNTER — Encounter: Payer: Self-pay | Admitting: Gastroenterology

## 2020-07-28 ENCOUNTER — Other Ambulatory Visit: Payer: Self-pay

## 2020-07-28 ENCOUNTER — Ambulatory Visit (INDEPENDENT_AMBULATORY_CARE_PROVIDER_SITE_OTHER): Payer: PRIVATE HEALTH INSURANCE | Admitting: Adult Health

## 2020-07-28 ENCOUNTER — Encounter (INDEPENDENT_AMBULATORY_CARE_PROVIDER_SITE_OTHER): Payer: Self-pay | Admitting: Adult Health

## 2020-07-28 VITALS — BP 120/82 | HR 74 | Temp 97.7°F | Ht 69.0 in | Wt 244.0 lb

## 2020-07-28 DIAGNOSIS — E559 Vitamin D deficiency, unspecified: Secondary | ICD-10-CM | POA: Diagnosis not present

## 2020-07-28 DIAGNOSIS — Z9189 Other specified personal risk factors, not elsewhere classified: Secondary | ICD-10-CM | POA: Diagnosis not present

## 2020-07-28 DIAGNOSIS — E8881 Metabolic syndrome: Secondary | ICD-10-CM

## 2020-07-28 DIAGNOSIS — E782 Mixed hyperlipidemia: Secondary | ICD-10-CM

## 2020-07-28 DIAGNOSIS — Z6836 Body mass index (BMI) 36.0-36.9, adult: Secondary | ICD-10-CM

## 2020-07-29 ENCOUNTER — Other Ambulatory Visit (INDEPENDENT_AMBULATORY_CARE_PROVIDER_SITE_OTHER): Payer: Self-pay | Admitting: Adult Health

## 2020-07-29 ENCOUNTER — Encounter (INDEPENDENT_AMBULATORY_CARE_PROVIDER_SITE_OTHER): Payer: Self-pay | Admitting: Adult Health

## 2020-07-29 DIAGNOSIS — E559 Vitamin D deficiency, unspecified: Secondary | ICD-10-CM

## 2020-07-29 LAB — VITAMIN D 25 HYDROXY (VIT D DEFICIENCY, FRACTURES): Vit D, 25-Hydroxy: 47.8 ng/mL (ref 30.0–100.0)

## 2020-07-29 MED ORDER — VITAMIN D (ERGOCALCIFEROL) 1.25 MG (50000 UNIT) PO CAPS
50000.0000 [IU] | ORAL_CAPSULE | ORAL | 0 refills | Status: DC
Start: 2020-07-29 — End: 2020-08-25

## 2020-08-01 ENCOUNTER — Other Ambulatory Visit: Payer: Self-pay | Admitting: Certified Nurse Midwife

## 2020-08-01 NOTE — Progress Notes (Signed)
Chief Complaint:   OBESITY Caitlin Barrett is here to discuss her progress with her obesity treatment plan along with follow-up of her obesity related diagnoses. Caitlin Barrett is on the Category 3 Plan and states she is following her eating plan approximately 80% of the time. Caitlin Barrett states she is walking 30 minutes 3 times per week.  Today's visit was #: 10 Starting weight: 277 lbs Starting date: 08/13/2019 Today's weight: 244 lbs Today's date: 07/28/2020 Total lbs lost to date: 33 Total lbs lost since last in-office visit: 1  Interim History: Caitlin Barrett was seen by cardiology on 07/18/2020- ECHO/lexiscan Myoview both "unrevealing". Cardiology does not recommend statin therapy at this time. She has been focusing on low inflammation foods and regular exercise.  Subjective:   1. Vitamin D deficiency Discussed labs with patient today. Caitlin Barrett's Vitamin D level was 49.2 on 06/09/2020. She completed 2 rounds of increased ergocalciferol due to sub-therapeutic Vit D with only once a week course.  2. Insulin resistance Discussed labs with patient today. Caitlin Barrett's insulin level is steadily improving. 06/09/2020 insulin was 8.9 with normal BG at 93.  Lab Results  Component Value Date   INSULIN 8.9 06/09/2020   INSULIN 12.4 03/31/2020   INSULIN 13.5 12/15/2019   INSULIN 19.0 08/13/2019   Lab Results  Component Value Date   HGBA1C 5.3 03/31/2020    3. Mixed hyperlipidemia Discussed labs with patient today. ASCVD risk score is 1%. Caitlin Barrett's 06/09/2020 labs revealed total and LDL levels are both elevated. Her HDL did also increased to 63. She was recently seen by cardiology- work-up negative. Statin therapy is not recommended at this time.  Lab Results  Component Value Date   CHOL 246 (H) 06/09/2020   HDL 63 06/09/2020   LDLCALC 168 (H) 06/09/2020   TRIG 89 06/09/2020   CHOLHDL 3.9 06/09/2020   Lab Results  Component Value Date   ALT 23 06/09/2020   AST 21 06/09/2020   ALKPHOS 111  06/09/2020   BILITOT 0.5 06/09/2020   The 10-year ASCVD risk score [name], et al., 2013) is: 1%   Values used to calculate the score:     Age: 50 years     Sex: Female     Is Non-Hispanic African American: No     Diabetic: No     Tobacco smoker: No     Systolic Blood Pressure: 831 mmHg     Is BP treated: No     HDL Cholesterol: 63 mg/dL     Total Cholesterol: 246 mg/dL  4. At risk for osteoporosis Caitlin Barrett is at higher risk of osteopenia and osteoporosis due to Vitamin D deficiency and obesity.  Assessment/Plan:   1. Vitamin D deficiency Low Vitamin D level contributes to fatigue and are associated with obesity, breast, and colon cancer. She agrees to continue to take prescription Vitamin D @50 ,000 IU, pending lab results, and will follow-up for routine testing of Vitamin D, at least 2-3 times per year to avoid over-replacement. Check labs today.  - VITAMIN D 25 Hydroxy (Vit-D Deficiency, Fractures)  2. Insulin resistance Caitlin Barrett will continue to work on weight loss, exercise, and decreasing simple carbohydrates to help decrease the risk of diabetes. Caitlin Barrett agreed to follow-up with [place] as directed to closely monitor her progress. Continue category 3 and regular walking. Check labs in June.  3. Mixed hyperlipidemia Cardiovascular risk and specific lipid/LDL goals reviewed.  We discussed several lifestyle modifications today and Caitlin Barrett will continue to work on diet, exercise and weight  loss efforts. Orders and follow up as documented in patient record. Decrease saturated fats. Never use tobacco/vape. Continue walking. Monitor labs.  Counseling Intensive lifestyle modifications are the first line treatment for this issue. . Dietary changes: Increase soluble fiber. Decrease simple carbohydrates. . Exercise changes: Moderate to vigorous-intensity aerobic activity 150 minutes per week if tolerated. . Lipid-lowering medications: see documented in medical record.  4. At risk  for osteoporosis Caitlin Barrett was given approximately 15 minutes of osteoporosis prevention counseling today. Caitlin Barrett is at risk for osteopenia and osteoporosis due to her Vitamin D deficiency. She was encouraged to take her Vitamin D and follow her higher calcium diet and increase strengthening exercise to help strengthen her bones and decrease her risk of osteopenia and osteoporosis.  Repetitive spaced learning was employed today to elicit superior memory formation and behavioral change.  5. Class 2 severe obesity with serious comorbidity and body mass index (BMI) of 36.0 to 36.9 in adult, unspecified obesity type [facility])  Caitlin Barrett is currently in the action stage of change. As such, her goal is to continue with weight loss efforts. She has agreed to the Category 3 Plan.   Exercise goals: As is  Behavioral modification strategies: increasing lean protein intake, decreasing simple carbohydrates, meal planning and cooking strategies and planning for success.  Caitlin Barrett has agreed to follow-up with our clinic in 4 weeks. She was informed of the importance of frequent follow-up visits to maximize her success with intensive lifestyle modifications for her multiple health conditions.   Objective:   Blood pressure 120/82, pulse 74, temperature 97.7 F (36.5 C), height 5' 9"  (1.753 m), weight 244 lb (110.7 kg), last menstrual period 05/26/2016, SpO2 98 %. Body mass index is 36.03 kg/m.  General: Cooperative, alert, well developed, in no acute distress. HEENT: Conjunctivae and lids unremarkable. Cardiovascular: Regular rhythm.  Lungs: Normal work of breathing. Neurologic: No focal deficits.   Lab Results  Component Value Date   CREATININE 0.66 06/09/2020   BUN 11 06/09/2020   NA 140 06/09/2020   K 4.4 06/09/2020   CL 101 06/09/2020   CO2 19 (L) 06/09/2020   Lab Results  Component Value Date   ALT 23 06/09/2020   AST 21 06/09/2020   ALKPHOS 111 06/09/2020   BILITOT 0.5 06/09/2020   Lab  Results  Component Value Date   HGBA1C 5.3 03/31/2020   HGBA1C 5.5 12/15/2019   HGBA1C 5.3 08/13/2019   HGBA1C 5.3 10/23/2017   HGBA1C 5.0 10/01/2016   Lab Results  Component Value Date   INSULIN 8.9 06/09/2020   INSULIN 12.4 03/31/2020   INSULIN 13.5 12/15/2019   INSULIN 19.0 08/13/2019   Lab Results  Component Value Date   TSH 0.081 (L) 08/13/2019   Lab Results  Component Value Date   CHOL 246 (H) 06/09/2020   HDL 63 06/09/2020   LDLCALC 168 (H) 06/09/2020   TRIG 89 06/09/2020   CHOLHDL 3.9 06/09/2020   Lab Results  Component Value Date   WBC 9.8 03/31/2020   HGB 14.2 03/31/2020   HCT 40.9 03/31/2020   MCV 88 03/31/2020   PLT 394 03/31/2020   Lab Results  Component Value Date   IRON 64 08/13/2019   TIBC 333 08/13/2019   FERRITIN 30 08/13/2019    Attestation Statements:   Reviewed by clinician on day of visit: allergies, medications, problem list, medical history, surgical history, family history, social history, and previous encounter notes.  Coral Ceo, CMA, am acting as transcriptionist for Mina Marble, NP.  I have reviewed the above documentation for accuracy and completeness, and I agree with the above. -  Fayez Sturgell d. Mahayla Haddaway, NP-C

## 2020-08-02 NOTE — Telephone Encounter (Signed)
Roselyn Reef ,   This pt last saw Dr. Marcelline Mates. Please send to her. I know I have seen her in the past but because Dr. Marcelline Mates did her annual please send to her.   Thanks

## 2020-08-02 NOTE — Telephone Encounter (Signed)
I thought I had already received a refill request on this patient but I will refill it again.

## 2020-08-02 NOTE — Telephone Encounter (Signed)
Please advise on refill.

## 2020-08-25 ENCOUNTER — Other Ambulatory Visit: Payer: Self-pay

## 2020-08-25 ENCOUNTER — Ambulatory Visit (INDEPENDENT_AMBULATORY_CARE_PROVIDER_SITE_OTHER): Payer: PRIVATE HEALTH INSURANCE | Admitting: Adult Health

## 2020-08-25 ENCOUNTER — Encounter (INDEPENDENT_AMBULATORY_CARE_PROVIDER_SITE_OTHER): Payer: Self-pay

## 2020-08-25 VITALS — BP 120/85 | HR 72 | Temp 98.0°F | Ht 69.0 in | Wt 244.0 lb

## 2020-08-25 DIAGNOSIS — Z9189 Other specified personal risk factors, not elsewhere classified: Secondary | ICD-10-CM

## 2020-08-25 DIAGNOSIS — E559 Vitamin D deficiency, unspecified: Secondary | ICD-10-CM | POA: Diagnosis not present

## 2020-08-25 DIAGNOSIS — Z6836 Body mass index (BMI) 36.0-36.9, adult: Secondary | ICD-10-CM

## 2020-08-25 MED ORDER — VITAMIN D (ERGOCALCIFEROL) 1.25 MG (50000 UNIT) PO CAPS: ORAL_CAPSULE | ORAL | 0 refills | Status: DC

## 2020-08-30 NOTE — Progress Notes (Signed)
Chief Complaint:   OBESITY Caitlin Barrett is here to discuss her progress with her obesity treatment plan along with follow-up of her obesity related diagnoses. Caitlin Barrett is on the Category 3 Plan and states she is following her eating plan approximately 85% of the time. Caitlin Barrett states she is walking 30 minutes 3 times per week.  Today's visit was #: 55 Starting weight: 277 lbs Starting date: 08/13/2019 Today's weight: 244 lbs Today's date: 08/25/2020 Total lbs lost to date: 33 Total lbs lost since last in-office visit: 0  Interim History: [name] has maintained her weight the last 4 weeks.  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  Of Note- her dig "[name]" is doing much better!  Subjective:   1. Vitamin D deficiency Caitlin Barrett's Vitamin D level was 47.8 on 07/28/2020- below goal of 50. She is currently taking prescription vitamin D 50,000 IU each week. She denies nausea, vomiting or muscle weakness.  2. At risk for osteoporosis Caitlin Barrett is at higher risk of osteopenia and osteoporosis due to Vitamin D deficiency and obesity.  Assessment/Plan:   1. Vitamin D deficiency Low Vitamin D level contributes to fatigue and are associated with obesity, breast, and colon cancer. She agrees to continue to take prescription Vitamin D @50 ,000 IU every week and will follow-up for routine testing of Vitamin D, at least 2-3 times per year to avoid over-replacement. -Check labs in July 2022. - Vitamin D, Ergocalciferol, (DRISDOL) 1.25 MG (50000 UNIT) CAPS capsule; One capsule by mouth once per week  Dispense: 4 capsule; Refill: 0  2. At risk for osteoporosis Caitlin Barrett was given approximately 15 minutes of osteoporosis prevention counseling today. Caitlin Barrett is at risk for osteopenia and osteoporosis due to her Vitamin D deficiency. She was encouraged to take her Vitamin D and follow her higher calcium diet and increase strengthening exercise to help  strengthen her bones and decrease her risk of osteopenia and osteoporosis.  Repetitive spaced learning was employed today to elicit superior memory formation and behavioral change.  3. Class 2 severe obesity with serious comorbidity and body mass index (BMI) of 36.0 to 36.9 in adult, unspecified obesity type [facility])  Caitlin Barrett is currently in the action stage of change. As such, her goal is to continue with weight loss efforts. She has agreed to the Category 3 Plan.   Recommend 5 minutes of daily stretching. Check fasting labs and IC at next OV.  Exercise goals: As is  Behavioral modification strategies: increasing lean protein intake, decreasing simple carbohydrates, increasing water intake, meal planning and cooking strategies, keeping healthy foods in the home and planning for success.  Caitlin Barrett has agreed to follow-up with our clinic in 4 weeks, with fasting labs and IC. She was informed of the importance of frequent follow-up visits to maximize her success with intensive lifestyle modifications for her multiple health conditions.   Objective:   Blood pressure 120/85, pulse 72, temperature 98 F (36.7 C), height 5' 9"  (1.753 m), weight 244 lb (110.7 kg), last menstrual period 05/26/2016, SpO2 97 %. Body mass index is 36.03 kg/m.  General: Cooperative, alert, well developed, in no acute distress. HEENT: Conjunctivae and lids unremarkable. Cardiovascular: Regular rhythm.  Lungs: Normal work of breathing. Neurologic: No focal deficits.   Lab Results  Component Value Date   CREATININE 0.66 06/09/2020   BUN 11 06/09/2020   NA 140 06/09/2020   K 4.4 06/09/2020   CL 101 06/09/2020   CO2  19 (L) 06/09/2020   Lab Results  Component Value Date   ALT 23 06/09/2020   AST 21 06/09/2020   ALKPHOS 111 06/09/2020   BILITOT 0.5 06/09/2020   Lab Results  Component Value Date   HGBA1C 5.3 03/31/2020   HGBA1C 5.5 12/15/2019   HGBA1C 5.3 08/13/2019   HGBA1C 5.3 10/23/2017   HGBA1C 5.0  10/01/2016   Lab Results  Component Value Date   INSULIN 8.9 06/09/2020   INSULIN 12.4 03/31/2020   INSULIN 13.5 12/15/2019   INSULIN 19.0 08/13/2019   Lab Results  Component Value Date   TSH 0.081 (L) 08/13/2019   Lab Results  Component Value Date   CHOL 246 (H) 06/09/2020   HDL 63 06/09/2020   LDLCALC 168 (H) 06/09/2020   TRIG 89 06/09/2020   CHOLHDL 3.9 06/09/2020   Lab Results  Component Value Date   WBC 9.8 03/31/2020   HGB 14.2 03/31/2020   HCT 40.9 03/31/2020   MCV 88 03/31/2020   PLT 394 03/31/2020   Lab Results  Component Value Date   IRON 64 08/13/2019   TIBC 333 08/13/2019   FERRITIN 30 08/13/2019    Attestation Statements:   Reviewed by clinician on day of visit: allergies, medications, problem list, medical history, surgical history, family history, social history, and previous encounter notes.  Coral Ceo, CMA, am acting as transcriptionist for Mina Marble, NP.  I have reviewed the above documentation for accuracy and completeness, and I agree with the above. -  Asuncion Tapscott d. Midge Momon, NP-C

## 2020-09-11 ENCOUNTER — Other Ambulatory Visit: Payer: Self-pay | Admitting: Certified Nurse Midwife

## 2020-09-19 ENCOUNTER — Ambulatory Visit
Admission: EM | Admit: 2020-09-19 | Discharge: 2020-09-19 | Disposition: A | Discharge status: Home / Self Care | Payer: No Typology Code available for payment source | Attending: Emergency Medicine | Admitting: Emergency Medicine

## 2020-09-19 ENCOUNTER — Encounter: Payer: Self-pay | Admitting: Emergency Medicine

## 2020-09-19 ENCOUNTER — Ambulatory Visit: Payer: Self-pay

## 2020-09-19 ENCOUNTER — Other Ambulatory Visit: Payer: Self-pay

## 2020-09-19 DIAGNOSIS — B349 Viral infection, unspecified: Secondary | ICD-10-CM | POA: Diagnosis not present

## 2020-09-19 NOTE — ED Triage Notes (Addendum)
Pt presents today with nasal congestion, scratchy throat and low grade fever x 5 days. No cough or SOB. +fatigue +headache  Home Covid test 6/25 and 6/27; both negative.

## 2020-09-19 NOTE — Discharge Instructions (Addendum)
Your COVID test is pending.  You should self quarantine until the test result is back.    Take Tylenol or ibuprofen as needed for fever or discomfort.  Rest and keep yourself hydrated.    Follow-up with your primary care provider if your symptoms are not improving.

## 2020-09-19 NOTE — ED Provider Notes (Signed)
8, [facility]    CSN: [id number] Arrival date & time: 09/19/20  1402      History   Chief Complaint Chief Complaint  Patient presents with   Nasal Congestion   Sore Throat    HPI Caitlin Barrett is a 50 y.o. female.  Patient presents with 5-day history of fatigue, fever, headache, congestion, sore throat, mild cough.  T-max 99.5.  She denies shortness of breath, vomiting, diarrhea, rash, or other symptoms.  Treatment at home with Claritin D.  Patient reports 2 negative at-home COVID tests since symptoms started.  Her medical history includes allergic rhinitis, asthma, hypothyroidism, obesity.  The history is provided by the patient and medical records.   Past Medical History:  Diagnosis Date   Allergy    seasonal.  Takes allergy shots at [facility]   Anemia    Anxiety    Arthritis    Asthma    exercise induced--no inhalers   Back pain    Cancer ([place]) 2010   Basal cell skin cancer Lt eye, skin graft above Lt eye   Constipation    Costochondritis    Depression    Dysmenorrhea    Eczema    Edema, lower extremity    GERD (gastroesophageal reflux disease)    Headache    Hypothyroid    Nodules and Fine needle aspiration   IBS (irritable bowel syndrome)    Lab test positive for detection of COVID-19 virus    November 2020   Myalgia    Osteoarthritis    Painful menstrual periods    Palpitations    Plantar fasciitis    Shortness of breath    Sprain of carpal (joint) of wrist    Vitamin B 12 deficiency    Vitamin D deficiency     Patient Active Problem List   Diagnosis Date Noted   Thyroid disease 06/09/2020   Cervical strain 04/25/2020   Fatigue 04/04/2020   GAD (generalized anxiety disorder) 02/08/2020   Pericardial pain 01/28/2020   Muscle soreness 01/28/2020   Mixed hyperlipidemia 11/25/2019   Elevated BP without diagnosis of hypertension 11/25/2019   Polyphagia 10/08/2019   Vitamin D deficiency 09/17/2019   Insulin resistance 09/17/2019    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   Class 2 severe obesity with serious comorbidity and body mass index (BMI) of 36.0 to 36.9 in adult [facility]) 09/17/2019   Exercise-induced asthma 04/22/2018   Monilial vaginitis 02/26/2017   Decreased libido 02/26/2017   Major depressive disorder, recurrent, in full remission ([place]) 02/26/2017   Dyspareunia, female 02/26/2017   S/P TAH (total abdominal hysterectomy) 07/02/2016   Nevus of abdominal wall 06/11/2016   Morton's neuralgia 07/21/2015   Nocturia 07/21/2015   Stress incontinence 07/21/2015   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   Allergic rhinitis 06/21/2015   Obesity (BMI 30-39.9) 06/21/2015    Past Surgical History:  Procedure Laterality Date   ABDOMINAL HYSTERECTOMY Bilateral 07/02/2016   Procedure: HYSTERECTOMY ABDOMINAL WITH BILATERAL SALPINGECTOMY-MIDLINE INCISION;  Surgeon: Brayton Mars, MD;  Location: [facility] ORS;  Service: Gynecology;  Laterality: Bilateral;   CHOLECYSTECTOMY  2009   fibromalga     HERNIA REPAIR  0865   umbilical   SKIN GRAFT  7846   TONSILLECTOMY      OB History     Gravida  0   Para  0   Term  0   Preterm  0   AB  0   Living  0      SAB  0   IAB  0   Ectopic  0   Multiple  0   Live Births  0            Home Medications    Prior to Admission medications   Medication Sig Start Date End Date Taking? Authorizing Provider  albuterol (VENTOLIN HFA) 108 (90 Base) MCG/ACT inhaler TAKE 2 PUFFS BY MOUTH EVERY 6 HOURS AS NEEDED FOR WHEEZE OR SHORTNESS OF BREATH 02/10/20   Karamalegos, Devonne Doughty, DO  clobetasol cream (TEMOVATE) 5.42 % Apply 1 application topically 2 (two) times daily. For up to 1-2 weeks then as needed. Do not use on sensitive skin. 04/24/19   Karamalegos, Devonne Doughty, DO  COLLAGEN-BORON-HYALURONIC ACID PO Take by mouth.    [provider]  cyclobenzaprine (FLEXERIL) 10 MG tablet Take 1 tablet (10 mg total) by mouth at bedtime as needed for muscle spasms. 06/17/20    Karamalegos, Devonne Doughty, DO  diphenhydrAMINE (BENADRYL) 25 mg capsule Take 25 mg by mouth every 6 (six) hours as needed.    [provider]  EPINEPHrine 0.3 mg/0.3 mL IJ SOAJ injection 0.3 mg as needed.  09/25/08   [provider]  estradiol (ESTRACE) 1 MG tablet TAKE 1 TABLET BY MOUTH EVERY DAY 09/12/20   Rubie Maid, MD  Loratadine 10 MG CAPS Take by mouth.    [provider]  methocarbamol (ROBAXIN) 500 MG tablet Take 1-2 tablets (500-1,000 mg total) by mouth daily as needed for muscle spasms. 06/17/20   Karamalegos, Devonne Doughty, DO  Misc Natural Products (OSTEO BI-FLEX ADV TRIPLE ST PO) Take by mouth.    [provider]  Multiple Vitamins-Minerals (MULTIVITAMIN WOMEN PO) Take by mouth.    [provider]  omeprazole (PRILOSEC) 40 MG capsule Take 40 mg by mouth as needed.    [provider]  ondansetron (ZOFRAN) 4 MG tablet Take by mouth as needed.  01/29/19   [provider]  oxymetazoline (AFRIN) 0.05 % nasal spray Place 1 spray into both nostrils as needed.     [provider]  Probiotic Product (TRUBIOTICS) CAPS Take 1 capsule by mouth daily.    [provider]  progesterone (PROMETRIUM) 200 MG capsule TAKE 1 CAPSULE BY MOUTH EVERY DAY 08/02/20   Rubie Maid, MD  triamcinolone cream (KENALOG) 0.1 % APPLY THIN FILM TWICE DAILY TO ECZEMA UNTIL CLEAR 12/23/18   [provider]  TURMERIC PO Take by mouth daily.    [provider]  Vitamin D, Ergocalciferol, (DRISDOL) 1.25 MG (50000 UNIT) CAPS capsule One capsule by mouth once per week 08/25/20   Mina Marble D, NP    Family History Family History  Problem Relation Age of Onset   Hyperlipidemia Mother    Hypertension Mother    Sleep apnea Mother    Obesity Mother    Cancer Father        lung CA   Heart disease Maternal Grandmother    Stroke Maternal Grandmother    Hypertension Maternal Grandmother    Diabetes Maternal Grandmother    Heart  disease Maternal Grandfather    Diabetes Maternal Grandfather    Heart attack Maternal Grandfather     Social History Social History   Tobacco Use   Smoking status: Never   Smokeless tobacco: Never  Vaping Use   Vaping Use: Never used  Substance Use Topics   Alcohol use: Yes    Comment: rare   Drug use: No     Allergies  Contrast media [iodinated diagnostic agents], Codeine, and Other   Review of Systems Review of Systems  Constitutional:  Positive for fatigue and fever. Negative for chills.  HENT:  Positive for congestion and sore throat. Negative for ear pain.   Respiratory:  Positive for cough. Negative for shortness of breath.   Cardiovascular:  Negative for chest pain and palpitations.  Gastrointestinal:  Negative for abdominal pain, diarrhea and vomiting.  Skin:  Negative for color change and rash.  Neurological:  Positive for headaches. Negative for dizziness and syncope.  All other systems reviewed and are negative.   Physical Exam Triage Vital Signs ED Triage Vitals  Enc Vitals Group     BP      Pulse      Resp      Temp      Temp src      SpO2      Weight      Height      Head Circumference      Peak Flow      Pain Score      Pain Loc      Pain Edu?      Excl. in [place]?    No data found.  Updated Vital Signs BP 123/69 (BP Location: Left Arm)   Pulse 88   Temp 98.3 F (36.8 C) (Oral)   Resp 16   LMP 05/26/2016 (Exact Date)   SpO2 97%   Visual Acuity Right Eye Distance:   Left Eye Distance:   Bilateral Distance:    Right Eye Near:   Left Eye Near:    Bilateral Near:     Physical Exam Vitals and nursing note reviewed.  Constitutional:      General: She is not in acute distress.    Appearance: She is well-developed. She is not ill-appearing.  HENT:     Head: Normocephalic and atraumatic.     Right Ear: Tympanic membrane normal.     Left Ear: Tympanic membrane normal.     Nose: Nose normal.     Mouth/Throat:     Mouth: Mucous  membranes are moist.     Pharynx: Oropharynx is clear.  Eyes:     Conjunctiva/sclera: Conjunctivae normal.  Cardiovascular:     Rate and Rhythm: Normal rate and regular rhythm.     Heart sounds: Normal heart sounds.  Pulmonary:     Effort: Pulmonary effort is normal. No respiratory distress.     Breath sounds: Normal breath sounds.  Abdominal:     Palpations: Abdomen is soft.     Tenderness: There is no abdominal tenderness.  Musculoskeletal:     Cervical back: Neck supple.  Skin:    General: Skin is warm and dry.  Neurological:     General: No focal deficit present.     Mental Status: She is alert and oriented to person, place, and time.     Gait: Gait normal.  Psychiatric:        Mood and Affect: Mood normal.        Behavior: Behavior normal.     UC Treatments / Results  Labs (all labs ordered are listed, but only abnormal results are displayed) Labs Reviewed  NOVEL CORONAVIRUS, NAA    EKG   Radiology No results found.  Procedures Procedures (including critical care time)  Medications Ordered in UC Medications - No data to display  Initial Impression / Assessment and Plan / UC Course  I have reviewed the triage vital signs  and the nursing notes.  Pertinent labs & imaging results that were available during my care of the patient were reviewed by me and considered in my medical decision making (see chart for details).   Viral illness.  COVID pending.  Instructed patient to self quarantine per CDC guidelines.  Discussed symptomatic treatment including Tylenol or ibuprofen, rest, hydration.  Instructed patient to follow up with PCP if symptoms are not improving.  Patient agrees to plan of care.    Final Clinical Impressions(s) / UC Diagnoses   Final diagnoses:  Viral illness     Discharge Instructions      Your COVID test is pending.  You should self quarantine until the test result is back.    Take Tylenol or ibuprofen as needed for fever or  discomfort.  Rest and keep yourself hydrated.    Follow-up with your primary care provider if your symptoms are not improving.         ED Prescriptions   None    PDMP not reviewed this encounter.   Sharion Balloon, NP 09/19/20 1426

## 2020-09-20 LAB — SARS-COV-2, NAA 2 DAY TAT

## 2020-09-20 LAB — NOVEL CORONAVIRUS, NAA: SARS-CoV-2, NAA: NOT DETECTED

## 2020-09-22 ENCOUNTER — Other Ambulatory Visit: Payer: Self-pay | Admitting: Family Medicine

## 2020-09-22 DIAGNOSIS — M542 Cervicalgia: Secondary | ICD-10-CM

## 2020-09-22 DIAGNOSIS — M25511 Pain in right shoulder: Secondary | ICD-10-CM

## 2020-09-22 DIAGNOSIS — M545 Low back pain, unspecified: Secondary | ICD-10-CM

## 2020-09-22 NOTE — Telephone Encounter (Signed)
Requested medication (s) are due for refill today:  yes   Requested medication (s) are on the active medication list: yes   Last refill:  08/19/2020  Future visit scheduled:  yes   Notes to clinic:  this refill cannot be delegated    Requested Prescriptions  Pending Prescriptions Disp Refills   methocarbamol (ROBAXIN) 500 MG tablet [Pharmacy Med Name: METHOCARBAMOL 500 MG TABLET] 60 tablet 2    Sig: Take 1-2 tablets (500-1,000 mg total) by mouth daily as needed for muscle spasms.      Not Delegated - Analgesics:  Muscle Relaxants Failed - 09/22/2020 10:41 AM      Failed - This refill cannot be delegated      Passed - Valid encounter within last 6 months    Recent Outpatient Visits           3 months ago Chronic right hip pain   [place], DO   5 months ago Acute right-sided low back pain without sciatica   [place], DO   7 months ago Atypical chest pain   [facility] Olin Hauser, DO   1 year ago [place], DO   1 year ago Fatigue, unspecified type   [place], DO       Future Appointments             In 9 months Bo Merino, MD [place]               cyclobenzaprine (FLEXERIL) 10 MG tablet [Pharmacy Med Name: CYCLOBENZAPRINE 10 MG TABLET] 30 tablet 2    Sig: TAKE 1 TABLET BY MOUTH AT BEDTIME AS NEEDED FOR MUSCLE SPASMS      Not Delegated - Analgesics:  Muscle Relaxants Failed - 09/22/2020 10:41 AM      Failed - This refill cannot be delegated      Passed - Valid encounter within last 6 months    Recent Outpatient Visits           3 months ago Chronic right hip pain   [place], DO   5 months ago Acute right-sided low back pain without sciatica   [place], DO   7 months ago Atypical chest pain   [facility] Olin Hauser, DO   1 year ago [place], DO   1 year ago Fatigue, unspecified type   [place], Devonne Doughty, DO       Future Appointments             In 9 months Bo Merino, MD [place] [facility]

## 2020-09-28 ENCOUNTER — Other Ambulatory Visit (INDEPENDENT_AMBULATORY_CARE_PROVIDER_SITE_OTHER): Payer: Self-pay | Admitting: Adult Health

## 2020-09-28 DIAGNOSIS — E559 Vitamin D deficiency, unspecified: Secondary | ICD-10-CM

## 2020-09-28 NOTE — Telephone Encounter (Signed)
Last seen by Valetta Fuller

## 2020-10-03 ENCOUNTER — Other Ambulatory Visit: Payer: Self-pay

## 2020-10-03 ENCOUNTER — Ambulatory Visit (INDEPENDENT_AMBULATORY_CARE_PROVIDER_SITE_OTHER): Payer: No Typology Code available for payment source | Admitting: Bariatrics

## 2020-10-03 ENCOUNTER — Encounter (INDEPENDENT_AMBULATORY_CARE_PROVIDER_SITE_OTHER): Payer: Self-pay | Admitting: Bariatrics

## 2020-10-03 ENCOUNTER — Ambulatory Visit (INDEPENDENT_AMBULATORY_CARE_PROVIDER_SITE_OTHER): Payer: PRIVATE HEALTH INSURANCE | Admitting: Adult Health

## 2020-10-03 VITALS — BP 126/84 | HR 65 | Temp 97.6°F | Ht 69.0 in | Wt 246.0 lb

## 2020-10-03 DIAGNOSIS — Z9189 Other specified personal risk factors, not elsewhere classified: Secondary | ICD-10-CM | POA: Diagnosis not present

## 2020-10-03 DIAGNOSIS — R5383 Other fatigue: Secondary | ICD-10-CM | POA: Diagnosis not present

## 2020-10-03 DIAGNOSIS — Z6841 Body Mass Index (BMI) 40.0 and over, adult: Secondary | ICD-10-CM

## 2020-10-03 DIAGNOSIS — E559 Vitamin D deficiency, unspecified: Secondary | ICD-10-CM

## 2020-10-03 DIAGNOSIS — R0602 Shortness of breath: Secondary | ICD-10-CM | POA: Diagnosis not present

## 2020-10-03 MED ORDER — VITAMIN D (ERGOCALCIFEROL) 1.25 MG (50000 UNIT) PO CAPS: ORAL_CAPSULE | ORAL | 0 refills | Status: DC

## 2020-10-05 NOTE — Progress Notes (Signed)
Chief Complaint:   OBESITY A[place] is here to discuss her progress with her obesity treatment plan along with follow-up of her obesity related diagnoses. Caitlin Barrett is on the Category 2 Plan and the [place] and states she is following her eating plan approximately 85% of the time. Caitlin Barrett states she is walking 30 minutes 3-4 times per week.  Today's visit was #: 18 Starting weight: 277 lbs Starting date: 08/13/2019 Today's weight: 246 lbs Today's date:10/03/2020 Total lbs lost to date: 31 lbs Total lbs lost since last in-office visit: 0  Interim History: Caitlin Barrett is up 2 lbs.  Subjective:   1. Other fatigue Caitlin Barrett's IC range is 1530.   2. SOB (shortness of breath) on exertion Caitlin Barrett's IC range is 1530.   3. Vitamin D deficiency Caitlin Barrett denies muscle weakness, nausea, and vomiting,  4. At risk for activity intolerance Caitlin Barrett is at risk for exercise intolerance due to fatigue and shortness of breath.  Assessment/Plan:   1. Other fatigue Caitlin Barrett does feel that her weight is causing her energy to be lower than it should be. Caitlin Barrett will increase exercise gradually. Fatigue may be related to obesity, depression or many other causes. Labs will be ordered, and in the meanwhile, Caitlin Barrett will focus on self care including making healthy food choices, increasing physical activity and focusing on stress reduction.   2. SOB (shortness of breath) on exertion Caitlin Barrett does feel that she gets out of breath more easily that she used to when she exercises. Caitlin Barrett's shortness of breath appears to be obesity related and exercise induced. She has agreed to work on weight loss and gradually increase exercise to treat her exercise induced shortness of breath. Will continue to monitor closely.   3. Vitamin D deficiency Low Vitamin D level contributes to fatigue and are associated with obesity, breast, and colon cancer. She agrees to continue to take prescription Vitamin D  50,000 IU every week. We will refill her medications for 30 today with no refills. and will follow-up for routine testing of Vitamin D, at least 2-3 times per year to avoid over-replacement.  - Vitamin D, Ergocalciferol, (DRISDOL) 1.25 MG (50000 UNIT) CAPS capsule; One capsule by mouth once per week  Dispense: 4 capsule; Refill: 0  4. At risk for activity intolerance Caitlin Barrett was given approximately 15 minutes of exercise intolerance counseling today. She is 50 y.o. female and has risk factors exercise intolerance including obesity. We discussed intensive lifestyle modifications today with an emphasis on specific weight loss instructions and strategies. Caitlin Barrett will slowly increase activity as tolerated.  Repetitive spaced learning was employed today to elicit superior memory formation and behavioral change.   5. Obesity, current BMI 36.4 Caitlin Barrett is currently in the action stage of change. As such, her goal is to continue with weight loss efforts. She has agreed to the Category 3 Plan and [place].  Caitlin Barrett will continue meal planning and intentional eating.  Exercise goals:  Caitlin Barrett is walking her dogs. She is using weight at the house and bands for exercise were given.  Behavioral modification strategies: increasing lean protein intake, decreasing simple carbohydrates, increasing vegetables, increasing water intake, decreasing eating out, no skipping meals, meal planning and cooking strategies, keeping healthy foods in the home, and planning for success.  Caitlin Barrett has agreed to follow-up with our clinic in 3 weeks. She was informed of the importance of frequent follow-up visits to maximize her success with intensive lifestyle modifications for her multiple health conditions.   Caitlin Barrett was informed  we would discuss her lab results at her next visit unless there is a critical issue that needs to be addressed sooner. Caitlin Barrett agreed to keep her next visit at the agreed upon time to discuss  these results.  Objective:   Blood pressure 126/84, pulse 65, temperature 97.6 F (36.4 C), height 5' 9"  (1.753 m), weight 246 lb (111.6 kg), last menstrual period 05/26/2016, SpO2 98 %. Body mass index is 36.33 kg/m.  General: Cooperative, alert, well developed, in no acute distress. HEENT: Conjunctivae and lids unremarkable. Cardiovascular: Regular rhythm.  Lungs: Normal work of breathing. Neurologic: No focal deficits.   Lab Results  Component Value Date   CREATININE 0.66 06/09/2020   BUN 11 06/09/2020   NA 140 06/09/2020   K 4.4 06/09/2020   CL 101 06/09/2020   CO2 19 (L) 06/09/2020   Lab Results  Component Value Date   ALT 23 06/09/2020   AST 21 06/09/2020   ALKPHOS 111 06/09/2020   BILITOT 0.5 06/09/2020   Lab Results  Component Value Date   HGBA1C 5.3 03/31/2020   HGBA1C 5.5 12/15/2019   HGBA1C 5.3 08/13/2019   HGBA1C 5.3 10/23/2017   HGBA1C 5.0 10/01/2016   Lab Results  Component Value Date   INSULIN 8.9 06/09/2020   INSULIN 12.4 03/31/2020   INSULIN 13.5 12/15/2019   INSULIN 19.0 08/13/2019   Lab Results  Component Value Date   TSH 0.081 (L) 08/13/2019   Lab Results  Component Value Date   CHOL 246 (H) 06/09/2020   HDL 63 06/09/2020   LDLCALC 168 (H) 06/09/2020   TRIG 89 06/09/2020   CHOLHDL 3.9 06/09/2020   Lab Results  Component Value Date   VD25OH 47.8 07/28/2020   VD25OH 49.2 06/09/2020   VD25OH 27.6 (L) 03/31/2020   Lab Results  Component Value Date   WBC 9.8 03/31/2020   HGB 14.2 03/31/2020   HCT 40.9 03/31/2020   MCV 88 03/31/2020   PLT 394 03/31/2020   Lab Results  Component Value Date   IRON 64 08/13/2019   TIBC 333 08/13/2019   FERRITIN 30 08/13/2019   Attestation Statements:   Reviewed by clinician on day of visit: allergies, medications, problem list, medical history, surgical history, family history, social history, and previous encounter notes.  I, Caitlin Barrett, RMA, am acting as Location manager for [employer],  DO.   I have reviewed the above documentation for accuracy and completeness, and I agree with the above. Caitlin Lesch, DO

## 2020-10-10 ENCOUNTER — Encounter (INDEPENDENT_AMBULATORY_CARE_PROVIDER_SITE_OTHER): Payer: Self-pay | Admitting: Bariatrics

## 2020-10-31 ENCOUNTER — Other Ambulatory Visit (INDEPENDENT_AMBULATORY_CARE_PROVIDER_SITE_OTHER): Payer: Self-pay | Admitting: Bariatrics

## 2020-10-31 DIAGNOSIS — E559 Vitamin D deficiency, unspecified: Secondary | ICD-10-CM

## 2020-10-31 NOTE — Telephone Encounter (Signed)
Last OV with Dr Owens Shark

## 2020-11-01 ENCOUNTER — Encounter (INDEPENDENT_AMBULATORY_CARE_PROVIDER_SITE_OTHER): Payer: Self-pay | Admitting: Adult Health

## 2020-11-03 ENCOUNTER — Telehealth (INDEPENDENT_AMBULATORY_CARE_PROVIDER_SITE_OTHER): Payer: No Typology Code available for payment source | Admitting: Adult Health

## 2020-11-03 ENCOUNTER — Other Ambulatory Visit: Payer: Self-pay

## 2020-11-03 ENCOUNTER — Encounter (INDEPENDENT_AMBULATORY_CARE_PROVIDER_SITE_OTHER): Payer: Self-pay | Admitting: Adult Health

## 2020-11-03 DIAGNOSIS — E559 Vitamin D deficiency, unspecified: Secondary | ICD-10-CM | POA: Diagnosis not present

## 2020-11-03 DIAGNOSIS — R110 Nausea: Secondary | ICD-10-CM

## 2020-11-03 DIAGNOSIS — J069 Acute upper respiratory infection, unspecified: Secondary | ICD-10-CM | POA: Diagnosis not present

## 2020-11-03 DIAGNOSIS — Z6841 Body Mass Index (BMI) 40.0 and over, adult: Secondary | ICD-10-CM

## 2020-11-03 MED ORDER — ONDANSETRON 8 MG PO TBDP: 8.0000 mg | ORAL_TABLET | Freq: Three times a day (TID) | ORAL | 0 refills | Status: DC | PRN

## 2020-11-03 MED ORDER — VITAMIN D (ERGOCALCIFEROL) 1.25 MG (50000 UNIT) PO CAPS: ORAL_CAPSULE | ORAL | 0 refills | Status: DC

## 2020-11-07 DIAGNOSIS — J069 Acute upper respiratory infection, unspecified: Secondary | ICD-10-CM | POA: Insufficient documentation

## 2020-11-07 DIAGNOSIS — R110 Nausea: Secondary | ICD-10-CM | POA: Insufficient documentation

## 2020-11-07 NOTE — Progress Notes (Signed)
TeleHealth Visit:  Due to the COVID-19 pandemic, this visit was completed with telemedicine (audio/video) technology to reduce patient and provider exposure as well as to preserve personal protective equipment.   Caitlin Barrett has verbally consented to this TeleHealth visit. The patient is located at home, the provider is located at the [facility] office. The participants in this visit include the listed provider and patient. The visit was conducted today via MyChart Video.   Chief Complaint: OBESITY Caitlin Barrett is here to discuss her progress with her obesity treatment plan along with follow-up of her obesity related diagnoses. Caitlin Barrett is on the Category 3 Plan or the [place] and states she is following her eating plan approximately 90% of the time. Caitlin Barrett states she is doing 0 minutes 0 times per week.  Today's visit was #: 39 Starting weight: 277 lbs Starting date: 08/13/2019  Interim History: Caitlin Barrett is currently experiencing cough, headache, pharyngitis, nasal drainage, nausea, and diarrhea symptoms present for 5 days. She tested on the 3rd day of symptoms, her home COVID-19 antigen test was negative. She will retest today.  Of note: Due to decreased RMR on 10/03/2020 at 1530, she was converted from Category 3 to Category 2 meal plan or Pescatarian.   Subjective:   1. Vitamin D deficiency Caitlin Barrett's last Vit D level was 47.8 on 07/28/2020. She is on prescription Vit D, and she denies nausea, vomiting, or muscle weakness.  2. Nausea without vomiting Caitlin Barrett has been experiencing nausea the past 5 days, making it difficult to eat.  3. Upper respiratory tract infection, unspecified type Caitlin Barrett is currently experiencing cough, headache, pharyngitis, nasal drainage, nausea, and diarrhea symptoms present for 5 days. She tested on the 3rd day of symptoms, her home COVID-19 antigen test was negative. She will retest today.  Assessment/Plan:   1. Vitamin D  deficiency Low Vitamin D level contributes to fatigue and are associated with obesity, breast, and colon cancer. We will refill prescription Vitamin D 50,000 IU every week for 1 month. Caitlin Barrett will follow-up for routine testing of Vitamin D, at least 2-3 times per year to avoid over-replacement.  - Vitamin D, Ergocalciferol, (DRISDOL) 1.25 MG (50000 UNIT) CAPS capsule; One capsule by mouth once per week  Dispense: 4 capsule; Refill: 0  2. Nausea without vomiting Caitlin Barrett agreed to start Zofran 8 mg every 8 hours as needed with no refills, and she will continue a bland diet.  - ondansetron (ZOFRAN-ODT) 8 MG disintegrating tablet; Take 1 tablet (8 mg total) by mouth every 8 (eight) hours as needed for nausea or vomiting.  Dispense: 20 tablet; Refill: 0  3. Upper respiratory tract infection, unspecified type Caitlin Barrett is to remain home with her acute symptoms. She will retest today with her home antigen test. If her test is positive she will follow the CDC recommendations and guidelines.   4. Obesity, current BMI 36.4 Caitlin Barrett is currently in the action stage of change. As such, her goal is to continue with weight loss efforts. She has agreed to the Category 2 Plan or the [place].   Caitlin Barrett will retest with a home antigen COVID-19 test. She is to follow a bland diet. When she is feeling better she is to follow the Category 2/Pescatarian plan.  Exercise goals: No exercise has been prescribed at this time due to acute illness.  Behavioral modification strategies: increasing lean protein intake, decreasing simple carbohydrates, meal planning and cooking strategies, keeping healthy foods in the home, and planning for success.  Caitlin Barrett has agreed to follow-up with our clinic in 3 weeks. She was informed of the importance of frequent follow-up visits to maximize her success with intensive lifestyle modifications for her multiple health conditions.  Objective:   VITALS: Per patient if  applicable, see vitals. GENERAL: Alert and in no acute distress. CARDIOPULMONARY: No increased WOB. Speaking in clear sentences.  PSYCH: Pleasant and cooperative. Speech normal rate and rhythm. Affect is appropriate. Insight and judgement are appropriate. Attention is focused, linear, and appropriate.  NEURO: Oriented as arrived to appointment on time with no prompting.   Lab Results  Component Value Date   CREATININE 0.66 06/09/2020   BUN 11 06/09/2020   NA 140 06/09/2020   K 4.4 06/09/2020   CL 101 06/09/2020   CO2 19 (L) 06/09/2020   Lab Results  Component Value Date   ALT 23 06/09/2020   AST 21 06/09/2020   ALKPHOS 111 06/09/2020   BILITOT 0.5 06/09/2020   Lab Results  Component Value Date   HGBA1C 5.3 03/31/2020   HGBA1C 5.5 12/15/2019   HGBA1C 5.3 08/13/2019   HGBA1C 5.3 10/23/2017   HGBA1C 5.0 10/01/2016   Lab Results  Component Value Date   INSULIN 8.9 06/09/2020   INSULIN 12.4 03/31/2020   INSULIN 13.5 12/15/2019   INSULIN 19.0 08/13/2019   Lab Results  Component Value Date   TSH 0.081 (L) 08/13/2019   Lab Results  Component Value Date   CHOL 246 (H) 06/09/2020   HDL 63 06/09/2020   LDLCALC 168 (H) 06/09/2020   TRIG 89 06/09/2020   CHOLHDL 3.9 06/09/2020   Lab Results  Component Value Date   VD25OH 47.8 07/28/2020   VD25OH 49.2 06/09/2020   VD25OH 27.6 (L) 03/31/2020   Lab Results  Component Value Date   WBC 9.8 03/31/2020   HGB 14.2 03/31/2020   HCT 40.9 03/31/2020   MCV 88 03/31/2020   PLT 394 03/31/2020   Lab Results  Component Value Date   IRON 64 08/13/2019   TIBC 333 08/13/2019   FERRITIN 30 08/13/2019    Attestation Statements:   Reviewed by clinician on day of visit: allergies, medications, problem list, medical history, surgical history, family history, social history, and previous encounter notes.   Wilhemena Durie, am acting as transcriptionist for Mina Marble, NP.  I have reviewed the above documentation for accuracy  and completeness, and I agree with the above. - Deklyn Trachtenberg d. Aly Seidenberg, NP-C

## 2020-11-09 ENCOUNTER — Ambulatory Visit: Payer: Self-pay

## 2020-11-15 ENCOUNTER — Telehealth: Payer: Self-pay | Admitting: Obstetrics and Gynecology

## 2020-11-15 DIAGNOSIS — Z8742 Personal history of other diseases of the female genital tract: Secondary | ICD-10-CM

## 2020-11-15 DIAGNOSIS — R102 Pelvic and perineal pain: Secondary | ICD-10-CM

## 2020-11-15 MED ORDER — FLUCONAZOLE 150 MG PO TABS: 150.0000 mg | ORAL_TABLET | Freq: Once | ORAL | 3 refills | Status: AC

## 2020-11-15 NOTE — Telephone Encounter (Signed)
I prescribed Diflucan to her pharmacy with a few refills.  Also, I will order the ultrasound as urgent, but if she feels it needs to be stat due to her symptoms, she would need to go to the Emergency Room to be evaluated and can receive a scan at that time, as there is a shortage of outpatient ultrasonographers.   Dr. Marcelline Mates

## 2020-11-15 NOTE — Telephone Encounter (Signed)
Dr. Marcelline Mates: Please see if she has tried something else first (such as Monistat over the counter). if not, try this. If so, I'll send something in. Also, I can order an urgent ultrasound and discuss with her through mychart and she can see me on the 8th.   I called the patient and Caitlin Barrett stated she has had this yeast infection on and off for about a month now.  Caitlin Barrett states she has tried the [employer] 3 day inserts twice already.  Her last dose was taken on 8/12.  Caitlin Barrett states it did nothing for her.  Caitlin Barrett would like something to be called in to [facility] in [place].  Also, Caitlin Barrett is interested in having the ultrasound done but states due to her work schedule she can only do something tomorrow afternoon.  I spoke with Sam and Sam suggested if you put the order in as STAT she can try to get them to schedule it for Caitlin Barrett for tomorrow as long as you put the end date for tomorrow.  I just told Caitlin Barrett I would try to see if we can get her scheduled for tomorrow but it may be a few days depending on scheduling.  Patient stated she would like to still keep the appointment with DR. Evans just incase nothing helps and she can be seen sooner rather than later.  Please advise.

## 2020-11-16 ENCOUNTER — Other Ambulatory Visit: Payer: Self-pay

## 2020-11-16 ENCOUNTER — Encounter: Payer: Self-pay | Admitting: Emergency Medicine

## 2020-11-16 ENCOUNTER — Ambulatory Visit: Payer: Self-pay

## 2020-11-16 ENCOUNTER — Ambulatory Visit
Admission: EM | Admit: 2020-11-16 | Discharge: 2020-11-16 | Disposition: A | Discharge status: Home / Self Care | Payer: No Typology Code available for payment source | Attending: Family Medicine | Admitting: Family Medicine

## 2020-11-16 DIAGNOSIS — R110 Nausea: Secondary | ICD-10-CM | POA: Diagnosis not present

## 2020-11-16 DIAGNOSIS — M545 Low back pain, unspecified: Secondary | ICD-10-CM | POA: Diagnosis not present

## 2020-11-16 DIAGNOSIS — B373 Candidiasis of vulva and vagina: Secondary | ICD-10-CM | POA: Insufficient documentation

## 2020-11-16 DIAGNOSIS — N898 Other specified noninflammatory disorders of vagina: Secondary | ICD-10-CM | POA: Diagnosis not present

## 2020-11-16 DIAGNOSIS — R1030 Lower abdominal pain, unspecified: Secondary | ICD-10-CM | POA: Insufficient documentation

## 2020-11-16 LAB — POCT URINALYSIS DIP (DEVICE)
Bilirubin Urine: NEGATIVE
Glucose, UA: NEGATIVE mg/dL
Ketones, ur: NEGATIVE mg/dL
Leukocytes,Ua: NEGATIVE
Nitrite: NEGATIVE
Protein, ur: NEGATIVE mg/dL
Specific Gravity, Urine: 1.02 (ref 1.005–1.030)
Urobilinogen, UA: 0.2 mg/dL (ref 0.0–1.0)
pH: 6.5 (ref 5.0–8.0)

## 2020-11-16 MED ORDER — DICLOFENAC SODIUM 75 MG PO TBEC
75.0000 mg | DELAYED_RELEASE_TABLET | Freq: Two times a day (BID) | ORAL | 0 refills | Status: DC | PRN
Start: 2020-11-16 — End: 2021-07-21

## 2020-11-16 NOTE — ED Triage Notes (Signed)
Pt c/o bilateral lower abdominal pain and lower back pain. Started about 5 days ago. She states she has also had green vaginal discharge. She was treated for a yeast infection yesterday.

## 2020-11-16 NOTE — ED Provider Notes (Signed)
[facility]    CSN: [id number] Arrival date & time: 11/16/20  1859  History   Chief Complaint Chief Complaint  Patient presents with   Abdominal Pain   HPI  50 year old female presents with the above complaint.  Patient reports 5-day history of pelvic pain and lower abdominal pain.  She now reports associated low back pain.  It has been worsening as of Friday.  Denies urinary symptoms.  She does note recent vaginal discharge.  She was recently prescribed Diflucan.  She endorses nausea but no vomiting.  She has an upcoming ultrasound scheduled.  Pain is 5/10 in severity.  No fever.  No relieving factors.  She has taken over-the-counter analgesics without relief.   Past Medical History:  Diagnosis Date   Allergy    seasonal.  Takes allergy shots at [facility]   Anemia    Anxiety    Arthritis    Asthma    exercise induced--no inhalers   Back pain    Cancer ([place]) 2010   Basal cell skin cancer Lt eye, skin graft above Lt eye   Constipation    Costochondritis    Depression    Dysmenorrhea    Eczema    Edema, lower extremity    GERD (gastroesophageal reflux disease)    Headache    Hypothyroid    Nodules and Fine needle aspiration   IBS (irritable bowel syndrome)    Lab test positive for detection of COVID-19 virus    November 2020   Myalgia    Osteoarthritis    Painful menstrual periods    Palpitations    Plantar fasciitis    Shortness of breath    Sprain of carpal (joint) of wrist    Vitamin B 12 deficiency    Vitamin D deficiency     Patient Active Problem List   Diagnosis Date Noted   Nausea without vomiting 11/07/2020   Upper respiratory tract infection 11/07/2020   Thyroid disease 06/09/2020   Cervical strain 04/25/2020   Fatigue 04/04/2020   GAD (generalized anxiety disorder) 02/08/2020   Pericardial pain 01/28/2020   Muscle soreness 01/28/2020   Mixed hyperlipidemia 11/25/2019   Elevated BP without diagnosis of hypertension 11/25/2019    Polyphagia 10/08/2019   Vitamin D deficiency 09/17/2019   Insulin resistance 09/17/2019   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   Class 2 severe obesity with serious comorbidity and body mass index (BMI) of 36.0 to 36.9 in adult [facility]) 09/17/2019   Exercise-induced asthma 04/22/2018   Monilial vaginitis 02/26/2017   Decreased libido 02/26/2017   Major depressive disorder, recurrent, in full remission ([place]) 02/26/2017   Dyspareunia, female 02/26/2017   S/P TAH (total abdominal hysterectomy) 07/02/2016   Nevus of abdominal wall 06/11/2016   Morton's neuralgia 07/21/2015   Nocturia 07/21/2015   Stress incontinence 07/21/2015   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   Allergic rhinitis 06/21/2015   Obesity (BMI 30-39.9) 06/21/2015    Past Surgical History:  Procedure Laterality Date   ABDOMINAL HYSTERECTOMY Bilateral 07/02/2016   Procedure: HYSTERECTOMY ABDOMINAL WITH BILATERAL SALPINGECTOMY-MIDLINE INCISION;  Surgeon: Brayton Mars, MD;  Location: [facility] ORS;  Service: Gynecology;  Laterality: Bilateral;   CHOLECYSTECTOMY  2009   fibromalga     HERNIA REPAIR  6606   umbilical   SKIN GRAFT  3016   TONSILLECTOMY      OB History     Gravida  0   Para  0   Term  0   Preterm  0   AB  0   Living  0      SAB  0   IAB  0   Ectopic  0   Multiple  0   Live Births  0            Home Medications    Prior to Admission medications   Medication Sig Start Date End Date Taking? Authorizing Provider  albuterol (VENTOLIN HFA) 108 (90 Base) MCG/ACT inhaler TAKE 2 PUFFS BY MOUTH EVERY 6 HOURS AS NEEDED FOR WHEEZE OR SHORTNESS OF BREATH 02/10/20  Yes Karamalegos, Alexander J, DO  COLLAGEN-BORON-HYALURONIC ACID PO Take by mouth.   Yes [provider]  diclofenac (VOLTAREN) 75 MG EC tablet Take 1 tablet (75 mg total) by mouth 2 (two) times daily as needed for moderate pain. 11/16/20  Yes Aleen Marston G, DO  EPINEPHrine 0.3 mg/0.3 mL IJ SOAJ injection 0.3 mg as needed.   09/25/08  Yes [provider]  estradiol (ESTRACE) 1 MG tablet TAKE 1 TABLET BY MOUTH EVERY DAY 09/12/20  Yes Rubie Maid, MD  Loratadine 10 MG CAPS Take by mouth.   Yes [provider]  Misc Natural Products (OSTEO BI-FLEX ADV TRIPLE ST PO) Take by mouth.   Yes [provider]  Multiple Vitamins-Minerals (MULTIVITAMIN WOMEN PO) Take by mouth.   Yes [provider]  omeprazole (PRILOSEC) 40 MG capsule Take 40 mg by mouth as needed.   Yes [provider]  ondansetron (ZOFRAN-ODT) 8 MG disintegrating tablet Take 1 tablet (8 mg total) by mouth every 8 (eight) hours as needed for nausea or vomiting. 11/03/20  Yes Danford, Valetta Fuller D, NP  Probiotic Product (TRUBIOTICS) CAPS Take 1 capsule by mouth daily.   Yes [provider]  progesterone (PROMETRIUM) 200 MG capsule TAKE 1 CAPSULE BY MOUTH EVERY DAY 08/02/20  Yes Rubie Maid, MD  TURMERIC PO Take by mouth daily.   Yes [provider]  Vitamin D, Ergocalciferol, (DRISDOL) 1.25 MG (50000 UNIT) CAPS capsule One capsule by mouth once per week 11/03/20  Yes Danford, Valetta Fuller D, NP  clobetasol cream (TEMOVATE) 5.80 % Apply 1 application topically 2 (two) times daily. For up to 1-2 weeks then as needed. Do not use on sensitive skin. 04/24/19   Karamalegos, Devonne Doughty, DO  oxymetazoline (AFRIN) 0.05 % nasal spray Place 1 spray into both nostrils as needed.     [provider]  triamcinolone cream (KENALOG) 0.1 % APPLY THIN FILM TWICE DAILY TO ECZEMA UNTIL CLEAR 12/23/18   [provider]    Family History Family History  Problem Relation Age of Onset   Hyperlipidemia Mother    Hypertension Mother    Sleep apnea Mother    Obesity Mother    Cancer Father        lung CA   Heart disease Maternal Grandmother    Stroke Maternal Grandmother    Hypertension Maternal Grandmother    Diabetes Maternal Grandmother    Heart disease Maternal Grandfather    Diabetes Maternal Grandfather     Heart attack Maternal Grandfather     Social History Social History   Tobacco Use   Smoking status: Never   Smokeless tobacco: Never  Vaping Use   Vaping Use: Never used  Substance Use Topics   Alcohol use: Yes    Comment: rare   Drug use: No     Allergies   Contrast media [iodinated diagnostic agents], Codeine, and Other   Review of Systems Review of Systems Per HPI  Physical Exam Triage Vital Signs ED Triage Vitals  Enc Vitals Group     BP 11/16/20 1922 134/86     Pulse Rate 11/16/20 1922 90     Resp 11/16/20 1922 18     Temp 11/16/20 1922 98.9 F (37.2 C)     Temp Source 11/16/20 1922 Oral     SpO2 11/16/20 1922 97 %     Weight 11/16/20 1920 255 lb 11.7 oz (116 kg)     Height 11/16/20 1920 5' 9"  (1.753 m)     Head Circumference --      Peak Flow --      Pain Score 11/16/20 1920 5     Pain Loc --      Pain Edu? --      Excl. in [place]? --    Updated Vital Signs BP 134/86 (BP Location: Left Arm)   Pulse 90   Temp 98.9 F (37.2 C) (Oral)   Resp 18   Ht 5' 9"  (1.753 m)   Wt 116 kg   LMP 05/26/2016 (Exact Date)   SpO2 97%   BMI 37.77 kg/m   Visual Acuity Right Eye Distance:   Left Eye Distance:   Bilateral Distance:    Right Eye Near:   Left Eye Near:    Bilateral Near:     Physical Exam Vitals and nursing note reviewed.  Constitutional:      General: She is not in acute distress.    Appearance: Normal appearance. She is not ill-appearing.  HENT:     Head: Normocephalic and atraumatic.  Cardiovascular:     Rate and Rhythm: Normal rate and regular rhythm.  Pulmonary:     Effort: Pulmonary effort is normal.     Breath sounds: Normal breath sounds. No wheezing, rhonchi or rales.  Abdominal:     General: There is no distension.     Palpations: Abdomen is soft.     Comments: Patient endorsing tenderness in the right lower quadrant and left lower quadrant.  No appreciable rebound or guarding.  Neurological:     Mental Status: She is alert.     UC Treatments / Results  Labs (all labs ordered are listed, but only abnormal results are displayed) Labs Reviewed  POCT URINALYSIS DIP (DEVICE) - Abnormal; Notable for the following components:      Result Value   Hgb urine dipstick TRACE (*)    All other components within normal limits  POCT URINALYSIS DIPSTICK, ED / UC  CERVICOVAGINAL ANCILLARY ONLY    EKG   Radiology No results found.  Procedures Procedures (including critical care time)  Medications Ordered in UC Medications - No data to display  Initial Impression / Assessment and Plan / UC Course  I have reviewed the triage vital signs and the nursing notes.  Pertinent labs & imaging results that were available during my care of the patient were reviewed by me and considered in my medical decision making (see chart for details).    50 year old female presents with lower abdominal pain/pelvic pain.  There is no evidence of acute abdomen.  She is well-appearing.  I do not suspect that this is GI in origin given the lack of other associated symptoms and the fact that it is bilateral.  No evidence of UTI.  Awaiting Aptima swab results. Patient has an upcoming ultrasound.  Needs ultrasound and OB/GYN follow-up.  Diclofenac as directed for pain.  Supportive care.  Final Clinical Impressions(s) / UC Diagnoses   Final  diagnoses:  Lower abdominal pain     Discharge Instructions      Medication as directed.  Call you OB GYN for follow up. I feel that you need the Ultrasound.  No evidence of UTI.   If you worsen (worsening pain, fever, etc), go to the hospital.  Take care  Dr. Lacinda Axon     ED Prescriptions     Medication Sig [place]. Provider   diclofenac (VOLTAREN) 75 MG EC tablet Take 1 tablet (75 mg total) by mouth 2 (two) times daily as needed for moderate pain. 30 tablet Coral Spikes, DO      PDMP not reviewed this encounter.   Coral Spikes, [place] 11/16/20 2028

## 2020-11-16 NOTE — Discharge Instructions (Addendum)
Medication as directed.  Call you OB GYN for follow up. I feel that you need the Ultrasound.  No evidence of UTI.   If you worsen (worsening pain, fever, etc), go to the hospital.  Take care  Dr. Lacinda Axon

## 2020-11-17 LAB — CERVICOVAGINAL ANCILLARY ONLY
Bacterial Vaginitis (gardnerella): NEGATIVE
Candida Glabrata: NEGATIVE
Candida Vaginitis: POSITIVE — AB
Chlamydia: NEGATIVE
Comment: NEGATIVE
Comment: NEGATIVE
Comment: NEGATIVE
Comment: NEGATIVE
Comment: NEGATIVE
Comment: NORMAL
Neisseria Gonorrhea: NEGATIVE
Trichomonas: NEGATIVE

## 2020-11-18 ENCOUNTER — Telehealth (HOSPITAL_COMMUNITY): Payer: Self-pay | Admitting: Emergency Medicine

## 2020-11-18 MED ORDER — FLUCONAZOLE 150 MG PO TABS: 150.0000 mg | ORAL_TABLET | Freq: Once | ORAL | 0 refills | Status: AC

## 2020-11-22 ENCOUNTER — Other Ambulatory Visit: Payer: Self-pay

## 2020-11-22 ENCOUNTER — Encounter: Payer: Self-pay | Admitting: Obstetrics and Gynecology

## 2020-11-22 ENCOUNTER — Ambulatory Visit: Payer: No Typology Code available for payment source | Admitting: Obstetrics and Gynecology

## 2020-11-22 VITALS — BP 143/89 | HR 83 | Ht 69.0 in | Wt 254.3 lb

## 2020-11-22 DIAGNOSIS — B3731 Acute candidiasis of vulva and vagina: Secondary | ICD-10-CM

## 2020-11-22 DIAGNOSIS — R102 Pelvic and perineal pain: Secondary | ICD-10-CM

## 2020-11-22 DIAGNOSIS — B373 Candidiasis of vulva and vagina: Secondary | ICD-10-CM | POA: Diagnosis not present

## 2020-11-22 NOTE — Progress Notes (Signed)
Pt present for yeast infection with pelvic pain. Pt c/o mid/lower back pain, pelvic pain and vaginal itching and burning x 2 weeks. [facility] sent in Diflucan on 11/15/2020 and pt received another rx of diflucan from the walk in clinic on 11/18/2020

## 2020-11-22 NOTE — Progress Notes (Signed)
HPI:      [name] is a 50 y.o. G0P0000 who LMP was Patient's last menstrual period was 05/26/2016 (exact date).  Subjective:   She presents today stating that she believes she has a yeast infection.  Approximately 1 month ago she began having vaginal discharge with burning and itching.  She used over-the-counter antifungals without success.  She then took a Diflucan that Dr. Marcelline Mates prescribed.  She continues to experience itching and discharge.  She presented to urgent care where urine was checked (negative) and cultures were performed showing monilia but no BV.  She presents today for follow-up of this. Additionally she has been experiencing bilateral left greater than right pelvic pain.  She states the pain was very bad the other day but it is slowly improving.  She describes the pain as similar to a previous ovarian cyst that she had. Of significant note patient has had a hysterectomy. She denies new sexual partners and has no STD concerns    Hx: The following portions of the patient's history were reviewed and updated as appropriate:             She  has a past medical history of Allergy, Anemia, Anxiety, Arthritis, Asthma, Back pain, Cancer ([place]) (2010), Constipation, Costochondritis, Depression, Dysmenorrhea, Eczema, Edema, lower extremity, GERD (gastroesophageal reflux disease), Headache, Hypothyroid, IBS (irritable bowel syndrome), Lab test positive for detection of COVID-19 virus, Myalgia, Osteoarthritis, Painful menstrual periods, Palpitations, Plantar fasciitis, Shortness of breath, Sprain of carpal (joint) of wrist, Vitamin B 12 deficiency, and Vitamin D deficiency. She does not have any pertinent problems on file. She  has a past surgical history that includes Cholecystectomy (2009); Tonsillectomy; Skin graft (2010); Hernia repair [phone]); Abdominal hysterectomy (Bilateral, 07/02/2016); and fibromalga. Her family history includes Cancer in her father; Diabetes in her maternal  grandfather, maternal grandmother, and mother; Heart attack in her maternal grandfather; Heart disease in her maternal grandfather and maternal grandmother; Hyperlipidemia in her mother; Hypertension in her maternal grandmother and mother; Obesity in her mother; Sleep apnea in her mother; Stroke in her maternal grandmother. She  reports that she has never smoked. She has never used smokeless tobacco. She reports current alcohol use. She reports that she does not use drugs. She has a current medication list which includes the following prescription(s): albuterol, clobetasol cream, collagen-boron-hyaluronic acid, diclofenac, epinephrine, estradiol, loratadine, misc natural products, multiple vitamins-minerals, omeprazole, ondansetron, oxymetazoline, trubiotics, progesterone, triamcinolone cream, turmeric, and vitamin d (ergocalciferol). She is allergic to contrast media [iodinated diagnostic agents], codeine, and other.       Review of Systems:  Review of Systems  Constitutional: Denied constitutional symptoms, night sweats, recent illness, fatigue, fever, insomnia and weight loss.  Eyes: Denied eye symptoms, eye pain, photophobia, vision change and visual disturbance.  Ears/Nose/Throat/Neck: Denied ear, nose, throat or neck symptoms, hearing loss, nasal discharge, sinus congestion and sore throat.  Cardiovascular: Denied cardiovascular symptoms, arrhythmia, chest pain/pressure, edema, exercise intolerance, orthopnea and palpitations.  Respiratory: Denied pulmonary symptoms, asthma, pleuritic pain, productive sputum, cough, dyspnea and wheezing.  Gastrointestinal: Denied, gastro-esophageal reflux, melena, nausea and vomiting.  Genitourinary: See HPI for additional information.  Musculoskeletal: Denied musculoskeletal symptoms, stiffness, swelling, muscle weakness and myalgia.  Dermatologic: Denied dermatology symptoms, rash and scar.  Neurologic: Denied neurology symptoms, dizziness, headache, neck  pain and syncope.  Psychiatric: Denied psychiatric symptoms, anxiety and depression.  Endocrine: Denied endocrine symptoms including hot flashes and night sweats.   Meds:   Current Outpatient Medications on File Prior to  Visit  Medication Sig Dispense Refill   albuterol (VENTOLIN HFA) 108 (90 Base) MCG/ACT inhaler TAKE 2 PUFFS BY MOUTH EVERY 6 HOURS AS NEEDED FOR WHEEZE OR SHORTNESS OF BREATH 18 each 2   clobetasol cream (TEMOVATE) 6.30 % Apply 1 application topically 2 (two) times daily. For up to 1-2 weeks then as needed. Do not use on sensitive skin. 30 g 1   COLLAGEN-BORON-HYALURONIC ACID PO Take by mouth.     diclofenac (VOLTAREN) 75 MG EC tablet Take 1 tablet (75 mg total) by mouth 2 (two) times daily as needed for moderate pain. 30 tablet 0   EPINEPHrine 0.3 mg/0.3 mL IJ SOAJ injection 0.3 mg as needed.      estradiol (ESTRACE) 1 MG tablet TAKE 1 TABLET BY MOUTH EVERY DAY 90 tablet 4   Loratadine 10 MG CAPS Take by mouth.     Misc Natural Products (OSTEO BI-FLEX ADV TRIPLE ST PO) Take by mouth.     Multiple Vitamins-Minerals (MULTIVITAMIN WOMEN PO) Take by mouth.     omeprazole (PRILOSEC) 40 MG capsule Take 40 mg by mouth as needed.     ondansetron (ZOFRAN-ODT) 8 MG disintegrating tablet Take 1 tablet (8 mg total) by mouth every 8 (eight) hours as needed for nausea or vomiting. 20 tablet 0   oxymetazoline (AFRIN) 0.05 % nasal spray Place 1 spray into both nostrils as needed.      Probiotic Product (TRUBIOTICS) CAPS Take 1 capsule by mouth daily.     progesterone (PROMETRIUM) 200 MG capsule TAKE 1 CAPSULE BY MOUTH EVERY DAY 90 capsule 3   triamcinolone cream (KENALOG) 0.1 % APPLY THIN FILM TWICE DAILY TO ECZEMA UNTIL CLEAR     TURMERIC PO Take by mouth daily.     Vitamin D, Ergocalciferol, (DRISDOL) 1.25 MG (50000 UNIT) CAPS capsule One capsule by mouth once per week 4 capsule 0   No current facility-administered medications on file prior to visit.      Objective:     Vitals:    11/22/20 1505  BP: (!) 143/89  Pulse: 83   Filed Weights   11/22/20 1505  Weight: 254 lb 4.8 oz (115.3 kg)              Patient declined examination today as she was just examined at urgent care and cultures were performed.          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   Class 2 severe obesity with serious comorbidity and body mass index (BMI) of 36.0 to 36.9 in adult [facility]) 09/17/2019   Exercise-induced asthma 04/22/2018   Monilial vaginitis 02/26/2017   Decreased libido 02/26/2017   Major depressive disorder, recurrent, in full remission ([place]) 02/26/2017   Dyspareunia, female 02/26/2017   S/P TAH (total abdominal hysterectomy) 07/02/2016   Nevus of abdominal wall 06/11/2016   Morton's neuralgia 07/21/2015   Nocturia 07/21/2015   Stress incontinence 07/21/2015   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 Monilial vulvovaginitis   2. Pelvic pain in female        Plan:            1.  Advised patient to take her prescription for Diflucan that she received from urgent care.  1 pill followed by 1 pill 3 days later.  2.  Ultrasound ordered to rule out ovarian cyst. Orders Orders Placed This Encounter  Procedures   US PELVIS TRANSVAGINAL NON-OB (TV ONLY)   US PELVIS (TRANSABDOMINAL ONLY)    No orders of the defined types were placed in this encounter.     F/U  No follow-ups  on file. I spent 23 minutes involved in the care of this patient preparing to see the patient by obtaining and reviewing her medical history (including labs, imaging tests and prior procedures), documenting clinical information in the electronic health record (EHR), counseling and coordinating care plans, writing and sending prescriptions, ordering tests or procedures and in direct communicating with the patient and medical staff discussing pertinent items from her history and physical exam.  Finis Bud, M.D. 11/22/2020 3:54 PM

## 2020-11-23 ENCOUNTER — Ambulatory Visit: Payer: No Typology Code available for payment source

## 2020-11-30 ENCOUNTER — Other Ambulatory Visit (INDEPENDENT_AMBULATORY_CARE_PROVIDER_SITE_OTHER): Payer: Self-pay | Admitting: Adult Health

## 2020-11-30 DIAGNOSIS — E559 Vitamin D deficiency, unspecified: Secondary | ICD-10-CM

## 2020-11-30 NOTE — Telephone Encounter (Signed)
LAST APPOINTMENT DATE: 11/03/20 NEXT APPOINTMENT DATE: 12/01/20   [facility] [phone] Lorina Rabon, [place] [address] [place] [address] Phone: [phone] Fax: [phone]  [id number] [place] [id number]: [phone]: [phone] Patient is requesting a refill of the following medications: Pending Prescriptions:                       Disp   Refills   Vitamin D, Ergocalciferol, (DRISDOL) 1.25 *4 caps*0       Sig: TAKE ONE CAPSULE BY MOUTH ONCE PER WEEK   Date last filled: 11/03/20 [facility] Results      Component                Value               Date                      HGBA1C                   5.3                 03/31/2020                HGBA1C                   5.5                 12/15/2019                HGBA1C                   5.3                 08/13/2019           Lab Results      Component                Value               Date                      LDLCALC                  168 (H)             06/09/2020                CREATININE               0.66                06/09/2020           Lab Results      Component                Value               Date                      VD25OH                   47.8                07/28/2020                VD25OH  49.2                06/09/2020                VD25OH                   27.6 (L)            03/31/2020            BP Readings from Last 3 Encounters: 11/22/20 : (!) 143/89 11/16/20 : 134/86 10/03/20 : 126/84

## 2020-12-01 ENCOUNTER — Ambulatory Visit (INDEPENDENT_AMBULATORY_CARE_PROVIDER_SITE_OTHER): Payer: No Typology Code available for payment source | Admitting: Adult Health

## 2020-12-01 ENCOUNTER — Other Ambulatory Visit: Payer: Self-pay

## 2020-12-01 VITALS — BP 128/83 | HR 73 | Temp 97.8°F | Ht 69.0 in | Wt 248.0 lb

## 2020-12-01 DIAGNOSIS — Z9189 Other specified personal risk factors, not elsewhere classified: Secondary | ICD-10-CM | POA: Diagnosis not present

## 2020-12-01 DIAGNOSIS — E8881 Metabolic syndrome: Secondary | ICD-10-CM

## 2020-12-01 DIAGNOSIS — E559 Vitamin D deficiency, unspecified: Secondary | ICD-10-CM | POA: Diagnosis not present

## 2020-12-01 DIAGNOSIS — R4589 Other symptoms and signs involving emotional state: Secondary | ICD-10-CM

## 2020-12-01 DIAGNOSIS — Z6841 Body Mass Index (BMI) 40.0 and over, adult: Secondary | ICD-10-CM

## 2020-12-01 NOTE — Progress Notes (Signed)
Chief Complaint:   OBESITY Caitlin Barrett is here to discuss her progress with her obesity treatment plan along with follow-up of her obesity related diagnoses. Caitlin Barrett is on the Category 2 Plan or the [place] and states she is following her eating plan approximately 75% of the time. Caitlin Barrett states she is not exercising regularly.  Today's visit was #: 20 Starting weight: 277 lbs Starting date: 08/13/2019 Today's weight: 248 lbs Today's date: 12/01/2020 Total lbs lost to date: +2 lbs Total lbs lost since last in-office visit: 29 lbs  Interim History: Caitlin Barrett currently reports intermittent pelvic pain, 3/10- recently seen by Urgent Care (treated for yeast inf) and GYN (imaging ordered). She has a pelvic US scheduled for 12/09/2020. She has a history of chronic yeast infections - she estimates to experience 3-4 yeast infections per year.   Last A1c on 03/31/2020 - 5.3.    Subjective:   1. Insulin resistance On 06/09/2020, insulin level 7.9, much improved from high of 19.0 on 08/13/2019. Her mother was recently diagnosed with T2D at age 52.  Lab Results  Component Value Date   INSULIN 8.9 06/09/2020   INSULIN 12.4 03/31/2020   INSULIN 13.5 12/15/2019   INSULIN 19.0 08/13/2019   Lab Results  Component Value Date   HGBA1C 5.3 03/31/2020   2. Vitamin D deficiency Vitamin D level 47.8 on 07/28/2020.   She is currently taking prescription ergocalciferol 50,000 IU each week. She denies nausea, vomiting or muscle weakness.  Lab Results  Component Value Date   VD25OH 47.8 07/28/2020   VD25OH 49.2 06/09/2020   VD25OH 27.6 (L) 03/31/2020   3. Depressed mood Family history of depression/anxiety She has personal history of depression and anxiety.  She was previously seen/diagnosed with bipolar disorder by a psychiatrist 15 years ago.  She cannot remember what mental health medications she has been on in the past.  Agreeable to [facility] referral.  Not currently on medication.  She denies  SI/HI.  4. At risk for diabetes mellitus Caitlin Barrett is at higher than average risk for developing diabetes due to obesity, insulin resistance, and her mother recently being diagnosed with type 2 diabetes.    Assessment/Plan:   1. Insulin resistance Check labs at next office visit.  Caitlin Barrett will continue to work on weight loss, exercise, and decreasing simple carbohydrates to help decrease the risk of diabetes. Caitlin Barrett agreed to follow-up with [place] as directed to closely monitor her progress.  2. Vitamin D deficiency Low Vitamin D level contributes to fatigue and are associated with obesity, breast, and colon cancer. She agrees to continue to take prescription ergocalciferol @50 ,000 IU every week.   Check labs at next office visit.  3. Depressed mood BH referral placed.  - Ambulatory referral to Psychology  4. At risk for diabetes mellitus Caitlin Barrett was given approximately 15 minutes of diabetes education and counseling today. We discussed intensive lifestyle modifications today with an emphasis on weight loss as well as increasing exercise and decreasing simple carbohydrates in her diet. We also reviewed medication options with an emphasis on risk versus benefit of those discussed.   Repetitive spaced learning was employed today to elicit superior memory formation and behavioral change.   5. Obesity, current BMI 36.6  Caitlin Barrett is currently in the action stage of change. As such, her goal is to continue with weight loss efforts. She has agreed to the Category 2 Plan and the [place].   Exercise goals:  Dog walking.  Behavioral modification strategies: increasing  lean protein intake, decreasing simple carbohydrates, no skipping meals, meal planning and cooking strategies, and planning for success.  Check fasting labs at next office visit.  Caitlin Barrett has agreed to follow-up with our clinic in 4 weeks, fasting for labs. She was informed of the importance of frequent follow-up visits  to maximize her success with intensive lifestyle modifications for her multiple health conditions.   Objective:   Blood pressure 128/83, pulse 73, temperature 97.8 F (36.6 C), height 5' 9"  (1.753 m), weight 248 lb (112.5 kg), last menstrual period 05/26/2016, SpO2 98 %. Body mass index is 36.62 kg/m.  General: Cooperative, alert, well developed, in no acute distress. HEENT: Conjunctivae and lids unremarkable. Cardiovascular: Regular rhythm.  Lungs: Normal work of breathing. Neurologic: No focal deficits.   Lab Results  Component Value Date   CREATININE 0.66 06/09/2020   BUN 11 06/09/2020   NA 140 06/09/2020   K 4.4 06/09/2020   CL 101 06/09/2020   CO2 19 (L) 06/09/2020   Lab Results  Component Value Date   ALT 23 06/09/2020   AST 21 06/09/2020   ALKPHOS 111 06/09/2020   BILITOT 0.5 06/09/2020   Lab Results  Component Value Date   HGBA1C 5.3 03/31/2020   HGBA1C 5.5 12/15/2019   HGBA1C 5.3 08/13/2019   HGBA1C 5.3 10/23/2017   HGBA1C 5.0 10/01/2016   Lab Results  Component Value Date   INSULIN 8.9 06/09/2020   INSULIN 12.4 03/31/2020   INSULIN 13.5 12/15/2019   INSULIN 19.0 08/13/2019   Lab Results  Component Value Date   TSH 0.081 (L) 08/13/2019   Lab Results  Component Value Date   CHOL 246 (H) 06/09/2020   HDL 63 06/09/2020   LDLCALC 168 (H) 06/09/2020   TRIG 89 06/09/2020   CHOLHDL 3.9 06/09/2020   Lab Results  Component Value Date   VD25OH 47.8 07/28/2020   VD25OH 49.2 06/09/2020   VD25OH 27.6 (L) 03/31/2020   Lab Results  Component Value Date   WBC 9.8 03/31/2020   HGB 14.2 03/31/2020   HCT 40.9 03/31/2020   MCV 88 03/31/2020   PLT 394 03/31/2020   Lab Results  Component Value Date   IRON 64 08/13/2019   TIBC 333 08/13/2019   FERRITIN 30 08/13/2019   Attestation Statements:   Reviewed by clinician on day of visit: allergies, medications, problem list, medical history, surgical history, family history, social history, and previous  encounter notes.  I, Water quality scientist, CMA, am acting as Location manager for Mina Marble, NP.  I have reviewed the above documentation for accuracy and completeness, and I agree with the above. -  Caitlin Barrett d. Omair Dettmer, NP-C

## 2020-12-05 ENCOUNTER — Encounter (INDEPENDENT_AMBULATORY_CARE_PROVIDER_SITE_OTHER): Payer: Self-pay | Admitting: Adult Health

## 2020-12-09 ENCOUNTER — Other Ambulatory Visit: Payer: No Typology Code available for payment source

## 2020-12-15 ENCOUNTER — Ambulatory Visit (INDEPENDENT_AMBULATORY_CARE_PROVIDER_SITE_OTHER): Payer: No Typology Code available for payment source

## 2020-12-15 ENCOUNTER — Other Ambulatory Visit: Payer: Self-pay

## 2020-12-15 ENCOUNTER — Other Ambulatory Visit: Payer: Self-pay | Admitting: Family Medicine

## 2020-12-15 DIAGNOSIS — Z8742 Personal history of other diseases of the female genital tract: Secondary | ICD-10-CM

## 2020-12-15 DIAGNOSIS — R102 Pelvic and perineal pain: Secondary | ICD-10-CM

## 2020-12-15 DIAGNOSIS — M545 Low back pain, unspecified: Secondary | ICD-10-CM

## 2020-12-15 DIAGNOSIS — M542 Cervicalgia: Secondary | ICD-10-CM

## 2020-12-15 DIAGNOSIS — M25511 Pain in right shoulder: Secondary | ICD-10-CM

## 2020-12-29 ENCOUNTER — Encounter (INDEPENDENT_AMBULATORY_CARE_PROVIDER_SITE_OTHER): Payer: Self-pay | Admitting: Adult Health

## 2020-12-29 ENCOUNTER — Other Ambulatory Visit: Payer: Self-pay

## 2020-12-29 ENCOUNTER — Ambulatory Visit (INDEPENDENT_AMBULATORY_CARE_PROVIDER_SITE_OTHER): Payer: No Typology Code available for payment source | Admitting: Adult Health

## 2020-12-29 ENCOUNTER — Telehealth: Payer: Self-pay

## 2020-12-29 VITALS — BP 121/81 | HR 79 | Temp 97.8°F | Ht 69.0 in | Wt 246.0 lb

## 2020-12-29 DIAGNOSIS — Z6841 Body Mass Index (BMI) 40.0 and over, adult: Secondary | ICD-10-CM | POA: Diagnosis not present

## 2020-12-29 DIAGNOSIS — Z9189 Other specified personal risk factors, not elsewhere classified: Secondary | ICD-10-CM | POA: Diagnosis not present

## 2020-12-29 DIAGNOSIS — E559 Vitamin D deficiency, unspecified: Secondary | ICD-10-CM | POA: Diagnosis not present

## 2020-12-29 DIAGNOSIS — E8881 Metabolic syndrome: Secondary | ICD-10-CM | POA: Diagnosis not present

## 2020-12-29 MED ORDER — VITAMIN D (ERGOCALCIFEROL) 1.25 MG (50000 UNIT) PO CAPS: ORAL_CAPSULE | ORAL | 0 refills | Status: DC

## 2020-12-29 MED ORDER — RYBELSUS 3 MG PO TABS: 3.0000 mg | ORAL_TABLET | Freq: Every day | ORAL | 0 refills | Status: DC

## 2020-12-29 NOTE — Telephone Encounter (Signed)
Pt. Ready to schedule colonoscopy

## 2020-12-29 NOTE — Progress Notes (Signed)
Chief Complaint:   OBESITY Caitlin Barrett is here to discuss her progress with her obesity treatment plan along with follow-up of her obesity related diagnoses. Caitlin Barrett is on the Category 2 Plan and the [place] and states she is following her eating plan approximately 85% of the time. Caitlin Barrett states she is walking for 30 minutes 2 times per week.  Today's visit was #: 21 Starting weight: 277 lbs Starting date: 08/13/2019 Today's weight: 246 lbs Today's date: 12/29/2020 Total lbs lost to date: 31 lbs Total lbs lost since last in-office visit: 2 lbs  Interim History: Caitlin Barrett is scheduled for ENT surgery - Deviation, turbinate repair - mid December 2022. Even when eating on plan, she will experience breakthrough polyphagia.  Subjective:   1. Insulin resistance She denies family history of MTC or personal history of pancreatitis.. Insulin remains above goal. She prefers oral route versus injection.  2. Vitamin D deficiency She is currently taking prescription ergocalciferol 50,000 IU each week. She denies nausea, vomiting or muscle weakness.  3. At risk for diabetes mellitus Caitlin Barrett is at higher than average risk for developing diabetes due to IR and obesity.   Assessment/Plan:   1. Insulin resistance Check labs today. Start Rybelsus 3 mg daily, as per below.  - Start Semaglutide (RYBELSUS) 3 MG TABS; Take 3 mg by mouth daily.  Dispense: 30 tablet; Refill: 0 - Comprehensive metabolic panel - Hemoglobin A1c - Insulin, random  2. Vitamin D deficiency Refill ergocalciferol 50,000 IU once weekly. Check labs today.  - VITAMIN D 25 Hydroxy (Vit-D Deficiency, Fractures) - Refill Vitamin D, Ergocalciferol, (DRISDOL) 1.25 MG (50000 UNIT) CAPS capsule; TAKE ONE CAPSULE BY MOUTH ONCE PER WEEK  Dispense: 4 capsule; Refill: 0  3. At risk for diabetes mellitus Caitlin Barrett was given approximately 15 minutes of diabetes education and counseling today. We discussed intensive  lifestyle modifications today with an emphasis on weight loss as well as increasing exercise and decreasing simple carbohydrates in her diet. We also reviewed medication options with an emphasis on risk versus benefit of those discussed.   Repetitive spaced learning was employed today to elicit superior memory formation and behavioral change.   4. Obesity, current BMI 36.3  Caitlin Barrett is currently in the action stage of change. As such, her goal is to continue with weight loss efforts. She has agreed to the Category 2 Plan.   Exercise goals:  Increase daily walking.  Behavioral modification strategies: increasing lean protein intake, decreasing simple carbohydrates, meal planning and cooking strategies, keeping healthy foods in the home, and planning for success.  Caitlin Barrett has agreed to follow-up with our clinic in 4 weeks. She was informed of the importance of frequent follow-up visits to maximize her success with intensive lifestyle modifications for her multiple health conditions.   Caitlin Barrett was informed we would discuss her lab results at her next visit unless there is a critical issue that needs to be addressed sooner. Caitlin Barrett agreed to keep her next visit at the agreed upon time to discuss these results.  Objective:   Blood pressure 121/81, pulse 79, temperature 97.8 F (36.6 C), height 5' 9"  (1.753 m), weight 246 lb (111.6 kg), last menstrual period 05/26/2016, SpO2 96 %. Body mass index is 36.33 kg/m.  General: Cooperative, alert, well developed, in no acute distress. HEENT: Conjunctivae and lids unremarkable. Cardiovascular: Regular rhythm.  Lungs: Normal work of breathing. Neurologic: No focal deficits.   Lab Results  Component Value Date   CREATININE 0.66 06/09/2020  BUN 11 06/09/2020   NA 140 06/09/2020   K 4.4 06/09/2020   CL 101 06/09/2020   CO2 19 (L) 06/09/2020   Lab Results  Component Value Date   ALT 23 06/09/2020   AST 21 06/09/2020   ALKPHOS 111  06/09/2020   BILITOT 0.5 06/09/2020   Lab Results  Component Value Date   HGBA1C 5.3 03/31/2020   HGBA1C 5.5 12/15/2019   HGBA1C 5.3 08/13/2019   HGBA1C 5.3 10/23/2017   HGBA1C 5.0 10/01/2016   Lab Results  Component Value Date   INSULIN 8.9 06/09/2020   INSULIN 12.4 03/31/2020   INSULIN 13.5 12/15/2019   INSULIN 19.0 08/13/2019   Lab Results  Component Value Date   TSH 0.081 (L) 08/13/2019   Lab Results  Component Value Date   CHOL 246 (H) 06/09/2020   HDL 63 06/09/2020   LDLCALC 168 (H) 06/09/2020   TRIG 89 06/09/2020   CHOLHDL 3.9 06/09/2020   Lab Results  Component Value Date   VD25OH 47.8 07/28/2020   VD25OH 49.2 06/09/2020   VD25OH 27.6 (L) 03/31/2020   Lab Results  Component Value Date   WBC 9.8 03/31/2020   HGB 14.2 03/31/2020   HCT 40.9 03/31/2020   MCV 88 03/31/2020   PLT 394 03/31/2020   Lab Results  Component Value Date   IRON 64 08/13/2019   TIBC 333 08/13/2019   FERRITIN 30 08/13/2019   Attestation Statements:   Reviewed by clinician on day of visit: allergies, medications, problem list, medical history, surgical history, family history, social history, and previous encounter notes.  I, Water quality scientist, CMA, am acting as Location manager for Mina Marble, NP.  I have reviewed the above documentation for accuracy and completeness, and I agree with the above. -  Cathie Bonnell d. Kathlyne Loud, NP-C

## 2020-12-30 ENCOUNTER — Other Ambulatory Visit: Payer: Self-pay

## 2020-12-30 DIAGNOSIS — Z1211 Encounter for screening for malignant neoplasm of colon: Secondary | ICD-10-CM

## 2020-12-30 LAB — COMPREHENSIVE METABOLIC PANEL
ALT: 11 IU/L (ref 0–32)
AST: 12 IU/L (ref 0–40)
Albumin/Globulin Ratio: 2 (ref 1.2–2.2)
Albumin: 4.5 g/dL (ref 3.8–4.8)
Alkaline Phosphatase: 48 IU/L (ref 44–121)
BUN/Creatinine Ratio: 27 — ABNORMAL HIGH (ref 9–23)
BUN: 22 mg/dL (ref 6–24)
Bilirubin Total: 0.3 mg/dL (ref 0.0–1.2)
CO2: 23 mmol/L (ref 20–29)
Calcium: 9.8 mg/dL (ref 8.7–10.2)
Chloride: 103 mmol/L (ref 96–106)
Creatinine, Ser: 0.83 mg/dL (ref 0.57–1.00)
Globulin, Total: 2.2 g/dL (ref 1.5–4.5)
Glucose: 88 mg/dL (ref 70–99)
Potassium: 4.7 mmol/L (ref 3.5–5.2)
Sodium: 141 mmol/L (ref 134–144)
Total Protein: 6.7 g/dL (ref 6.0–8.5)
eGFR: 87 mL/min/{1.73_m2} (ref >59)

## 2020-12-30 LAB — HEMOGLOBIN A1C
Est. average glucose Bld gHb Est-mCnc: 105 mg/dL
Hgb A1c MFr Bld: 5.3 % (ref 4.8–5.6)

## 2020-12-30 LAB — VITAMIN D 25 HYDROXY (VIT D DEFICIENCY, FRACTURES): Vit D, 25-Hydroxy: 35.9 ng/mL (ref 30.0–100.0)

## 2020-12-30 LAB — INSULIN, RANDOM: INSULIN: 10.1 u[IU]/mL (ref 2.6–24.9)

## 2020-12-30 MED ORDER — PEG 3350-KCL-NA BICARB-NACL 420 G PO SOLR: 4000.0000 mL | Freq: Once | ORAL | 0 refills | Status: AC

## 2020-12-30 NOTE — Progress Notes (Signed)
Gastroenterology Pre-Procedure Review  Request Date: 01/26/2021 Requesting Physician: Dr. Marius Ditch   PATIENT REVIEW QUESTIONS: The patient responded to the following health history questions as indicated:    1. Are you having any GI issues? yes (consitipation) 2. Do you have a personal history of Polyps? yes (14 years agao) 3. Do you have a family history of Colon Cancer or Polyps? yes (mom) 4. Diabetes Mellitus? no 5. Joint replacements in the past 12 months?no 6. Major health problems in the past 3 months?no 7. Any artificial heart valves, MVP, or defibrillator?no    MEDICATIONS & ALLERGIES:    Patient reports the following regarding taking any anticoagulation/antiplatelet therapy:   Plavix, Coumadin, Eliquis, Xarelto, Lovenox, Pradaxa, Brilinta, or Effient? no Aspirin? no  Patient confirms/reports the following medications:  Current Outpatient Medications  Medication Sig Dispense Refill   albuterol (VENTOLIN HFA) 108 (90 Base) MCG/ACT inhaler TAKE 2 PUFFS BY MOUTH EVERY 6 HOURS AS NEEDED FOR WHEEZE OR SHORTNESS OF BREATH 18 each 2   clobetasol cream (TEMOVATE) 1.59 % Apply 1 application topically 2 (two) times daily. For up to 1-2 weeks then as needed. Do not use on sensitive skin. 30 g 1   COLLAGEN-BORON-HYALURONIC ACID PO Take by mouth.     diclofenac (VOLTAREN) 75 MG EC tablet Take 1 tablet (75 mg total) by mouth 2 (two) times daily as needed for moderate pain. 30 tablet 0   EPINEPHrine 0.3 mg/0.3 mL IJ SOAJ injection 0.3 mg as needed.      estradiol (ESTRACE) 1 MG tablet TAKE 1 TABLET BY MOUTH EVERY DAY 90 tablet 4   Loratadine 10 MG CAPS Take by mouth.     Misc Natural Products (OSTEO BI-FLEX ADV TRIPLE ST PO) Take by mouth.     Multiple Vitamins-Minerals (MULTIVITAMIN WOMEN PO) Take by mouth.     omeprazole (PRILOSEC) 40 MG capsule Take 40 mg by mouth as needed.     ondansetron (ZOFRAN-ODT) 8 MG disintegrating tablet Take 1 tablet (8 mg total) by mouth every 8 (eight) hours as  needed for nausea or vomiting. 20 tablet 0   oxymetazoline (AFRIN) 0.05 % nasal spray Place 1 spray into both nostrils as needed.      Probiotic Product (TRUBIOTICS) CAPS Take 1 capsule by mouth daily.     progesterone (PROMETRIUM) 200 MG capsule TAKE 1 CAPSULE BY MOUTH EVERY DAY 90 capsule 3   Semaglutide (RYBELSUS) 3 MG TABS Take 3 mg by mouth daily. 30 tablet 0   triamcinolone cream (KENALOG) 0.1 % APPLY THIN FILM TWICE DAILY TO ECZEMA UNTIL CLEAR     TURMERIC PO Take by mouth daily.     Vitamin D, Ergocalciferol, (DRISDOL) 1.25 MG (50000 UNIT) CAPS capsule TAKE ONE CAPSULE BY MOUTH ONCE PER WEEK 4 capsule 0   No current facility-administered medications for this visit.    Patient confirms/reports the following allergies:  Allergies  Allergen Reactions   Contrast Media [Iodinated Diagnostic Agents]    Codeine Nausea Only   Other Hives    Uncoded Allergy. Allergen: contrast dye Uncoded Allergy. Allergen: contrast dye    No orders of the defined types were placed in this encounter.   AUTHORIZATION INFORMATION Primary Insurance: 1D#: Group #:  Secondary Insurance: 1D#: Group #:  SCHEDULE INFORMATION: Date: 01/26/2021 Time: Location: [facility]

## 2021-01-03 ENCOUNTER — Other Ambulatory Visit: Payer: Self-pay | Admitting: Family Medicine

## 2021-01-03 ENCOUNTER — Ambulatory Visit
Admission: RE | Admit: 2021-01-03 | Discharge: 2021-01-03 | Disposition: A | Discharge status: Home / Self Care | Payer: No Typology Code available for payment source | Source: Ambulatory Visit | Attending: Obstetrics and Gynecology | Admitting: Obstetrics and Gynecology

## 2021-01-03 ENCOUNTER — Other Ambulatory Visit: Payer: Self-pay

## 2021-01-03 DIAGNOSIS — Z1231 Encounter for screening mammogram for malignant neoplasm of breast: Secondary | ICD-10-CM | POA: Insufficient documentation

## 2021-01-03 DIAGNOSIS — M542 Cervicalgia: Secondary | ICD-10-CM

## 2021-01-03 DIAGNOSIS — M25511 Pain in right shoulder: Secondary | ICD-10-CM

## 2021-01-03 DIAGNOSIS — M545 Low back pain, unspecified: Secondary | ICD-10-CM

## 2021-01-03 NOTE — Telephone Encounter (Signed)
Requested medications are due for refill today.  unknown  Requested medications are on the active medications list.  no  Last refill. 09/22/2020 for both  Future visit scheduled.   no  Notes to clinic.  Both medications were discontinued by Dr. Lacinda Axon on 11/16/2020.

## 2021-01-09 ENCOUNTER — Encounter (INDEPENDENT_AMBULATORY_CARE_PROVIDER_SITE_OTHER): Payer: Self-pay | Admitting: Adult Health

## 2021-01-09 NOTE — Telephone Encounter (Signed)
Can you please check the status of PA for pt.

## 2021-01-09 NOTE — Telephone Encounter (Signed)
Prior authorization has been started for Rybelsus. Prior authorization was never received to get this request started. Will notify patient and provider once response is received.

## 2021-01-12 ENCOUNTER — Encounter (INDEPENDENT_AMBULATORY_CARE_PROVIDER_SITE_OTHER): Payer: Self-pay

## 2021-01-16 ENCOUNTER — Encounter: Payer: Self-pay | Admitting: Podiatry

## 2021-01-16 ENCOUNTER — Other Ambulatory Visit: Payer: Self-pay

## 2021-01-16 ENCOUNTER — Ambulatory Visit (INDEPENDENT_AMBULATORY_CARE_PROVIDER_SITE_OTHER): Payer: No Typology Code available for payment source

## 2021-01-16 ENCOUNTER — Other Ambulatory Visit: Payer: Self-pay | Admitting: Podiatry

## 2021-01-16 ENCOUNTER — Ambulatory Visit: Payer: No Typology Code available for payment source | Admitting: Podiatry

## 2021-01-16 DIAGNOSIS — M722 Plantar fascial fibromatosis: Secondary | ICD-10-CM | POA: Diagnosis not present

## 2021-01-16 DIAGNOSIS — M729 Fibroblastic disorder, unspecified: Secondary | ICD-10-CM

## 2021-01-16 DIAGNOSIS — D2371 Other benign neoplasm of skin of right lower limb, including hip: Secondary | ICD-10-CM | POA: Diagnosis not present

## 2021-01-16 DIAGNOSIS — D2372 Other benign neoplasm of skin of left lower limb, including hip: Secondary | ICD-10-CM | POA: Diagnosis not present

## 2021-01-16 MED ORDER — TRIAMCINOLONE ACETONIDE 40 MG/ML IJ SUSP
40.0000 mg | Freq: Once | INTRAMUSCULAR | Status: AC
  Administered 2021-01-16: 40 mg

## 2021-01-16 MED ORDER — METHYLPREDNISOLONE 4 MG PO TBPK: ORAL_TABLET | ORAL | 0 refills | Status: DC

## 2021-01-16 MED ORDER — MELOXICAM 15 MG PO TABS: 15.0000 mg | ORAL_TABLET | Freq: Every day | ORAL | 3 refills | Status: DC

## 2021-01-16 NOTE — Progress Notes (Signed)
Subjective:  Patient ID: Caitlin Barrett, female    DOB: 1971-03-10,  MRN: [id number] HPI Chief Complaint  Patient presents with   Foot Pain    Plantar heel bilateral (L>R) - aching x 2-3 months, pulling sensation in arch x 1 month, tried Tylenol and muscle relaxer at night    Callouses    Sub 5th MPJ bilateral - small, callused areas   New Patient (Initial Visit)    Est pt 2017    50 y.o. female presents with the above complaint.   ROS: Denies fever chills nausea vomiting muscle aches pains calf pain back pain chest pain shortness of breath.  Past Medical History:  Diagnosis Date   Allergy    seasonal.  Takes allergy shots at [facility]   Anemia    Anxiety    Arthritis    Asthma    exercise induced--no inhalers   Back pain    Cancer (HCC) 2010   Basal cell skin cancer Lt eye, skin graft above Lt eye   Constipation    Costochondritis    Depression    Dysmenorrhea    Eczema    Edema, lower extremity    GERD (gastroesophageal reflux disease)    Headache    Hypothyroid    Nodules and Fine needle aspiration   IBS (irritable bowel syndrome)    Lab test positive for detection of COVID-19 virus    November 2020   Myalgia    Osteoarthritis    Painful menstrual periods    Palpitations    Plantar fasciitis    Shortness of breath    Sprain of carpal (joint) of wrist    Vitamin B 12 deficiency    Vitamin D deficiency    Past Surgical History:  Procedure Laterality Date   ABDOMINAL HYSTERECTOMY Bilateral 07/02/2016   Procedure: HYSTERECTOMY ABDOMINAL WITH BILATERAL SALPINGECTOMY-MIDLINE INCISION;  Surgeon: Herold Harms, MD;  Location: [facility] ORS;  Service: Gynecology;  Laterality: Bilateral;   CHOLECYSTECTOMY  2009   fibromalga     HERNIA REPAIR  1974   umbilical   SKIN GRAFT  2010   TONSILLECTOMY      Current Outpatient Medications:    meloxicam (MOBIC) 15 MG tablet, Take 1 tablet (15 mg total) by mouth daily., Disp: 30 tablet, Rfl: 3   methylPREDNISolone  (MEDROL DOSEPAK) 4 MG TBPK tablet, 6 day dose pack - take as directed, Disp: 21 tablet, Rfl: 0   albuterol (VENTOLIN HFA) 108 (90 Base) MCG/ACT inhaler, TAKE 2 PUFFS BY MOUTH EVERY 6 HOURS AS NEEDED FOR WHEEZE OR SHORTNESS OF BREATH, Disp: 18 each, Rfl: 2   clobetasol cream (TEMOVATE) 0.05 %, Apply 1 application topically 2 (two) times daily. For up to 1-2 weeks then as needed. Do not use on sensitive skin., Disp: 30 g, Rfl: 1   COLLAGEN-BORON-HYALURONIC ACID PO, Take by mouth., Disp: , Rfl:    cyclobenzaprine (FLEXERIL) 10 MG tablet, TAKE 1 TABLET BY MOUTH AT BEDTIME AS NEEDED FOR MUSCLE SPASMS., Disp: 30 tablet, Rfl: 2   diclofenac (VOLTAREN) 75 MG EC tablet, Take 1 tablet (75 mg total) by mouth 2 (two) times daily as needed for moderate pain., Disp: 30 tablet, Rfl: 0   EPINEPHrine 0.3 mg/0.3 mL IJ SOAJ injection, 0.3 mg as needed. , Disp: , Rfl:    estradiol (ESTRACE) 1 MG tablet, TAKE 1 TABLET BY MOUTH EVERY DAY, Disp: 90 tablet, Rfl: 4   loratadine (CLARITIN) 10 MG tablet, Take 10 mg by mouth daily as  needed., Disp: , Rfl:    Loratadine 10 MG CAPS, Take by mouth., Disp: , Rfl:    methocarbamol (ROBAXIN) 500 MG tablet, TAKE 1-2 TABLETS (500-1,000 MG TOTAL) BY MOUTH DAILY AS NEEDED FOR MUSCLE SPASMS., Disp: 60 tablet, Rfl: 2   Misc Natural Products (OSTEO BI-FLEX ADV TRIPLE ST PO), Take by mouth., Disp: , Rfl:    Multiple Vitamins-Minerals (MULTIVITAMIN WOMEN PO), Take by mouth., Disp: , Rfl:    omeprazole (PRILOSEC) 40 MG capsule, Take 40 mg by mouth as needed., Disp: , Rfl:    ondansetron (ZOFRAN-ODT) 8 MG disintegrating tablet, Take 1 tablet (8 mg total) by mouth every 8 (eight) hours as needed for nausea or vomiting., Disp: 20 tablet, Rfl: 0   oxymetazoline (AFRIN) 0.05 % nasal spray, Place 1 spray into both nostrils as needed. , Disp: , Rfl:    Probiotic Product (TRUBIOTICS) CAPS, Take 1 capsule by mouth daily., Disp: , Rfl:    progesterone (PROMETRIUM) 200 MG capsule, TAKE 1 CAPSULE BY  MOUTH EVERY DAY, Disp: 90 capsule, Rfl: 3   Semaglutide (RYBELSUS) 3 MG TABS, Take 3 mg by mouth daily., Disp: 30 tablet, Rfl: 0   triamcinolone cream (KENALOG) 0.1 %, APPLY THIN FILM TWICE DAILY TO ECZEMA UNTIL CLEAR, Disp: , Rfl:    TURMERIC PO, Take by mouth daily., Disp: , Rfl:    Vitamin D, Ergocalciferol, (DRISDOL) 1.25 MG (50000 UNIT) CAPS capsule, TAKE ONE CAPSULE BY MOUTH ONCE PER WEEK, Disp: 4 capsule, Rfl: 0  Allergies  Allergen Reactions   Contrast Media [Iodinated Diagnostic Agents]    Codeine Nausea Only   Other Hives    Uncoded Allergy. Allergen: contrast dye Uncoded Allergy. Allergen: contrast dye   Review of Systems Objective:  There were no vitals filed for this visit.  General: Well developed, nourished, in no acute distress, alert and oriented x3   Dermatological: Skin is warm, dry and supple bilateral. Nails x 10 are well maintained; remaining integument appears unremarkable at this time. There are no open sores, no preulcerative lesions, no rash or signs of infection present.  She has a benign skin lesion beneath the fifth metatarsals bilateral left greater than right hyperkeratotic in nature does not appear to be verrucoid nature.  Vascular: Dorsalis Pedis artery and Posterior Tibial artery pedal pulses are 2/4 bilateral with immedate capillary fill time. Pedal hair growth present. No varicosities and no lower extremity edema present bilateral.   Neruologic: Grossly intact via light touch bilateral. Vibratory intact via tuning fork bilateral. Protective threshold with Semmes Wienstein monofilament intact to all pedal sites bilateral. Patellar and Achilles deep tendon reflexes 2+ bilateral. No Babinski or clonus noted bilateral.   Musculoskeletal: No gross boney pedal deformities bilateral. No pain, crepitus, or limitation noted with foot and ankle range of motion bilateral. Muscular strength 5/5 in all groups tested bilateral.  Hallux abductovalgus deformity right  greater than left nontender.  She has pain palpation medial calcaneal tubercles bilateral.  Gait: Unassisted, Nonantalgic.    Radiographs:  Radiographs taken today demonstrate plantar distally right Piceno heel spur soft tissue increase in density plantar fashion calcaneal insertion site.  Mild hallux valgus deformity right greater than left.  Assessment & Plan:   Assessment: Benign skin lesion hyperkeratotic in nature subfifth bilateral as well as pain in limb secondary to Planter fasciitis bilateral.  Hallux valgus right greater than left.  Plan: Discussed etiology pathology conservative surgical therapies.  I injected the bilateral heels today 20 mg Kenalog per milligrams Marcaine point  of maximal tenderness.  Started her on methylprednisolone to be followed by meloxicam and I debrided the reactive hyperkeratotic tissue.  Flexor plantar fascial braces and we will follow-up with her in 1 month or so.     Ladislao Cohenour T. [place], [place]

## 2021-01-16 NOTE — Patient Instructions (Signed)

## 2021-01-25 ENCOUNTER — Encounter: Payer: Self-pay | Admitting: Gastroenterology

## 2021-01-26 ENCOUNTER — Ambulatory Visit
Admission: RE | Admit: 2021-01-26 | Discharge: 2021-01-26 | Disposition: A | Discharge status: Home / Self Care | Payer: PRIVATE HEALTH INSURANCE | Attending: Gastroenterology | Admitting: Gastroenterology

## 2021-01-26 ENCOUNTER — Encounter: Payer: Self-pay | Admitting: Gastroenterology

## 2021-01-26 ENCOUNTER — Encounter
Admission: RE | Disposition: A | Discharge status: Home / Self Care | Payer: Self-pay | Source: Home / Self Care | Attending: Gastroenterology

## 2021-01-26 ENCOUNTER — Other Ambulatory Visit: Payer: Self-pay

## 2021-01-26 ENCOUNTER — Ambulatory Visit: Payer: PRIVATE HEALTH INSURANCE | Admitting: Certified Registered Nurse Anesthetist

## 2021-01-26 DIAGNOSIS — E039 Hypothyroidism, unspecified: Secondary | ICD-10-CM | POA: Diagnosis not present

## 2021-01-26 DIAGNOSIS — Z888 Allergy status to other drugs, medicaments and biological substances status: Secondary | ICD-10-CM | POA: Insufficient documentation

## 2021-01-26 DIAGNOSIS — Z1211 Encounter for screening for malignant neoplasm of colon: Secondary | ICD-10-CM | POA: Diagnosis not present

## 2021-01-26 DIAGNOSIS — K644 Residual hemorrhoidal skin tags: Secondary | ICD-10-CM | POA: Insufficient documentation

## 2021-01-26 DIAGNOSIS — J45909 Unspecified asthma, uncomplicated: Secondary | ICD-10-CM | POA: Diagnosis not present

## 2021-01-26 DIAGNOSIS — Z79899 Other long term (current) drug therapy: Secondary | ICD-10-CM | POA: Diagnosis not present

## 2021-01-26 DIAGNOSIS — Z91041 Radiographic dye allergy status: Secondary | ICD-10-CM | POA: Insufficient documentation

## 2021-01-26 DIAGNOSIS — Z8616 Personal history of COVID-19: Secondary | ICD-10-CM | POA: Diagnosis not present

## 2021-01-26 DIAGNOSIS — K635 Polyp of colon: Secondary | ICD-10-CM | POA: Diagnosis not present

## 2021-01-26 DIAGNOSIS — K589 Irritable bowel syndrome without diarrhea: Secondary | ICD-10-CM | POA: Insufficient documentation

## 2021-01-26 DIAGNOSIS — D123 Benign neoplasm of transverse colon: Secondary | ICD-10-CM | POA: Diagnosis not present

## 2021-01-26 DIAGNOSIS — D124 Benign neoplasm of descending colon: Secondary | ICD-10-CM | POA: Insufficient documentation

## 2021-01-26 DIAGNOSIS — E669 Obesity, unspecified: Secondary | ICD-10-CM | POA: Insufficient documentation

## 2021-01-26 DIAGNOSIS — Z885 Allergy status to narcotic agent status: Secondary | ICD-10-CM | POA: Insufficient documentation

## 2021-01-26 DIAGNOSIS — Z6838 Body mass index (BMI) 38.0-38.9, adult: Secondary | ICD-10-CM | POA: Diagnosis not present

## 2021-01-26 DIAGNOSIS — K219 Gastro-esophageal reflux disease without esophagitis: Secondary | ICD-10-CM | POA: Diagnosis not present

## 2021-01-26 HISTORY — PX: COLONOSCOPY WITH PROPOFOL: SHX5780

## 2021-01-26 SURGERY — COLONOSCOPY WITH PROPOFOL
Anesthesia: General

## 2021-01-26 MED ORDER — LIDOCAINE HCL (CARDIAC) PF 100 MG/5ML IV SOSY
PREFILLED_SYRINGE | INTRAVENOUS | Status: DC | PRN
  Administered 2021-01-26: 50 mg via INTRAVENOUS

## 2021-01-26 MED ORDER — SODIUM CHLORIDE 0.9 % IV SOLN: INTRAVENOUS | Status: DC

## 2021-01-26 MED ORDER — PROPOFOL 500 MG/50ML IV EMUL
INTRAVENOUS | Status: DC | PRN
  Administered 2021-01-26: 180 ug/kg/min via INTRAVENOUS

## 2021-01-26 MED ORDER — LIDOCAINE HCL (PF) 2 % IJ SOLN
INTRAMUSCULAR | Status: AC
  Filled 2021-01-26: qty 5

## 2021-01-26 MED ORDER — PROPOFOL 10 MG/ML IV BOLUS
INTRAVENOUS | Status: DC | PRN
  Administered 2021-01-26: 20 mg via INTRAVENOUS
  Administered 2021-01-26: 60 mg via INTRAVENOUS

## 2021-01-26 MED ORDER — PROPOFOL 500 MG/50ML IV EMUL
INTRAVENOUS | Status: AC
  Filled 2021-01-26: qty 50

## 2021-01-26 NOTE — Transfer of Care (Signed)
Immediate Anesthesia Transfer of Care Note  Patient: Caitlin Barrett  Procedure(s) Performed: COLONOSCOPY WITH PROPOFOL  Patient Location: Endoscopy Unit  Anesthesia Type:General  Level of Consciousness: awake, alert  and oriented  Airway & Oxygen Therapy: Patient Spontanous Breathing  Post-op Assessment: Report given to RN and Post -op Vital signs reviewed and stable  Post vital signs: Reviewed and stable  Last Vitals:  Vitals Value Taken Time  BP 120/76 01/26/21 0857  Temp 35.6 C 01/26/21 0857  Pulse 80 01/26/21 0900  Resp 21 01/26/21 0900  SpO2 98 % 01/26/21 0900  Vitals shown include unvalidated device data.  Last Pain:  Vitals:   01/26/21 0857  TempSrc: Temporal  PainSc:          Complications: No notable events documented.

## 2021-01-26 NOTE — Anesthesia Preprocedure Evaluation (Signed)
Anesthesia Evaluation  Patient identified by MRN, date of birth, ID band Patient awake    Reviewed: Allergy & Precautions, NPO status , Patient's Chart, lab work & pertinent test results  Airway Mallampati: II  TM Distance: >3 FB Neck ROM: full    Dental  (+) Teeth Intact   Pulmonary neg pulmonary ROS, asthma ,    Pulmonary exam normal breath sounds clear to auscultation       Cardiovascular Exercise Tolerance: Good negative cardio ROS Normal cardiovascular exam Rhythm:Regular Rate:Normal     Neuro/Psych  Headaches, Anxiety Depression  Neuromuscular disease negative neurological ROS  negative psych ROS   GI/Hepatic negative GI ROS, Neg liver ROS, GERD  ,  Endo/Other  negative endocrine ROSHypothyroidism   Renal/GU negative Renal ROS  negative genitourinary   Musculoskeletal  (+) Arthritis ,   Abdominal (+) + obese,   Peds negative pediatric ROS (+)  Hematology negative hematology ROS (+) Blood dyscrasia, anemia ,   Anesthesia Other Findings Past Medical History: No date: Allergy     Comment:  seasonal.  Takes allergy shots at [facility] No date: Anemia No date: Anxiety No date: Arthritis No date: Asthma     Comment:  exercise induced--no inhalers No date: Back pain 2010: Cancer ([place])     Comment:  Basal cell skin cancer Lt eye, skin graft above Lt eye No date: Constipation No date: Costochondritis No date: Depression No date: Dysmenorrhea No date: Eczema No date: Edema, lower extremity No date: GERD (gastroesophageal reflux disease) No date: Headache No date: Hypothyroid     Comment:  Nodules and Fine needle aspiration No date: IBS (irritable bowel syndrome) No date: Lab test positive for detection of COVID-19 virus     Comment:  November 2020 No date: Myalgia No date: Osteoarthritis No date: Painful menstrual periods No date: Palpitations No date: Plantar fasciitis No date: Shortness of  breath No date: Sprain of carpal (joint) of wrist No date: Vitamin B 12 deficiency No date: Vitamin D deficiency  Past Surgical History: 07/02/2016: ABDOMINAL HYSTERECTOMY; Bilateral     Comment:  Procedure: HYSTERECTOMY ABDOMINAL WITH BILATERAL               SALPINGECTOMY-MIDLINE INCISION;  Surgeon: Brayton Mars, MD;  Location: [facility] ORS;  Service:               Gynecology;  Laterality: Bilateral; 2009: CHOLECYSTECTOMY No date: fibromalga 1974: HERNIA REPAIR     Comment:  umbilical 3748: SKIN GRAFT No date: TONSILLECTOMY  BMI    Body Mass Index: 38.01 kg/m      Reproductive/Obstetrics negative OB ROS                             Anesthesia Physical Anesthesia Plan  ASA: 3  Anesthesia Plan: General   Post-op Pain Management:    Induction: Intravenous  PONV Risk Score and Plan: Propofol infusion and TIVA  Airway Management Planned: Nasal Cannula  Additional Equipment:   Intra-op Plan:   Post-operative Plan:   Informed Consent: I have reviewed the patients History and Physical, chart, labs and discussed the procedure including the risks, benefits and alternatives for the proposed anesthesia with the patient or authorized representative who has indicated his/her understanding and acceptance.     Dental Advisory Given  Plan Discussed with: CRNA and Surgeon  Anesthesia Plan Comments:  Anesthesia Quick Evaluation  

## 2021-01-26 NOTE — Op Note (Signed)
[facility] Gastroenterology Patient Name: Caitlin Barrett Procedure Date: 01/26/2021 8:22 AM MRN: [id number] Account #: [id number] Date of Birth: 11/02/1970 Admit Type: Outpatient Age: 50 Room: [facility] ENDO ROOM 4 Gender: Female Note Status: Finalized Instrument Name: Prentice Docker [id number] Procedure:             Colonoscopy Indications:           Screening for colorectal malignant neoplasm, This is                         the patient's first colonoscopy Providers:             Toney Reil MD, MD Referring MD:          Smitty Cords (Referring MD) Medicines:             General Anesthesia Complications:         No immediate complications. Estimated blood loss: None. Procedure:             Pre-Anesthesia Assessment:                        - Prior to the procedure, a History and Physical was                         performed, and patient medications and allergies were                         reviewed. The patient is competent. The risks and                         benefits of the procedure and the sedation options and                         risks were discussed with the patient. All questions                         were answered and informed consent was obtained.                         Patient identification and proposed procedure were                         verified by the physician, the nurse, the                         anesthesiologist, the anesthetist and the technician                         in the pre-procedure area in the procedure room in the                         endoscopy suite. Mental Status Examination: alert and                         oriented. Airway Examination: normal oropharyngeal                         airway and neck mobility. Respiratory Examination:  clear to auscultation. CV Examination: normal.                         Prophylactic Antibiotics: The patient does not require                          prophylactic antibiotics. Prior Anticoagulants: The                         patient has taken no previous anticoagulant or                         antiplatelet agents. ASA Grade Assessment: III - A                         patient with severe systemic disease. After reviewing                         the risks and benefits, the patient was deemed in                         satisfactory condition to undergo the procedure. The                         anesthesia plan was to use general anesthesia.                         Immediately prior to administration of medications,                         the patient was re-assessed for adequacy to receive                         sedatives. The heart rate, respiratory rate, oxygen                         saturations, blood pressure, adequacy of pulmonary                         ventilation, and response to care were monitored                         throughout the procedure. The physical status of the                         patient was re-assessed after the procedure.                        After obtaining informed consent, the colonoscope was                         passed under direct vision. Throughout the procedure,                         the patient's blood pressure, pulse, and oxygen                         saturations were monitored continuously. The  Colonoscope was introduced through the anus and                         advanced to the the terminal ileum, with                         identification of the appendiceal orifice and IC                         valve. The colonoscopy was performed without                         difficulty. The patient tolerated the procedure well.                         The quality of the bowel preparation was evaluated                         using the BBPS [facility] [employer] [facility] Bowel Preparation Scale) with                         scores of: Right Colon = 3, Transverse Colon = 3 and                          Left Colon = 3 (entire mucosa seen well with no                         residual staining, small fragments of stool or opaque                         liquid). The total BBPS score equals 9. Findings:      The perianal and digital rectal examinations were normal. Pertinent       negatives include normal sphincter tone and no palpable rectal lesions.      Skin tags were found on perianal exam.      The terminal ileum appeared normal.      Four sessile polyps were found in the descending colon and transverse       colon. The polyps were 3 to 5 mm in size. These polyps were removed with       a cold snare. Resection and retrieval were complete. Estimated blood       loss: none.      The retroflexed view of the distal rectum and anal verge was normal and       showed no anal or rectal abnormalities. Impression:            - Perianal skin tags found on perianal exam.                        - The examined portion of the ileum was normal.                        - Four 3 to 5 mm polyps in the descending colon and in                         the transverse colon, removed with a cold snare.  Resected and retrieved.                        - The distal rectum and anal verge are normal on                         retroflexion view. Recommendation:        - Discharge patient to home (with escort).                        - Resume previous diet today.                        - Continue present medications.                        - Await pathology results.                        - Repeat colonoscopy in 3 years for surveillance based                         on pathology results. Procedure Code(s):     --- Professional ---                        [id number], Colonoscopy, flexible; with removal of                         tumor(s), polyp(s), or other lesion(s) by snare                         technique Diagnosis Code(s):     --- Professional ---                        Z12.11, Encounter for  screening for malignant neoplasm                         of colon                        K63.5, Polyp of colon                        K64.4, Residual hemorrhoidal skin tags CPT copyright 2019 American Medical Association. All rights reserved. The codes documented in this report are preliminary and upon coder review may  be revised to meet current compliance requirements. Dr. Libby Maw Toney Reil MD, MD 01/26/2021 8:56:30 AM This report has been signed electronically. Number of Addenda: 0 Note Initiated On: 01/26/2021 8:22 AM Scope Withdrawal Time: 0 hours 15 minutes 17 seconds  Total Procedure Duration: 0 hours 20 minutes 29 seconds  Estimated Blood Loss:  Estimated blood loss: none.      [facility]

## 2021-01-26 NOTE — H&P (Signed)
Caitlin Darby, MD [address]  [place], [place] [address]  Main: [phone]  Fax: [phone] Pager: [phone]  Primary Care Physician:  Olin Hauser, DO Primary Gastroenterologist:  Dr. Cephas Barrett  Pre-Procedure History & Physical: HPI:  Caitlin Barrett is a 50 y.o. female is here for an colonoscopy.   Past Medical History:  Diagnosis Date   Allergy    seasonal.  Takes allergy shots at [facility] [address]   Anemia    Anxiety    Arthritis    Asthma    exercise induced--no inhalers   Back pain    Cancer ([place]) 2010   Basal cell skin cancer Lt eye, skin graft above Lt eye   Constipation    Costochondritis    Depression    Dysmenorrhea    Eczema    Edema, lower extremity    GERD (gastroesophageal reflux disease)    Headache    Hypothyroid    Nodules and Fine needle aspiration   IBS (irritable bowel syndrome)    Lab test positive for detection of COVID-19 virus    November 2020   Myalgia    Osteoarthritis    Painful menstrual periods    Palpitations    Plantar fasciitis    Shortness of breath    Sprain of carpal (joint) of wrist    Vitamin B 12 deficiency    Vitamin D deficiency     Past Surgical History:  Procedure Laterality Date   ABDOMINAL HYSTERECTOMY Bilateral 07/02/2016   Procedure: HYSTERECTOMY ABDOMINAL WITH BILATERAL SALPINGECTOMY-MIDLINE INCISION;  Surgeon: Brayton Mars, MD;  Location: [facility] ORS;  Service: Gynecology;  Laterality: Bilateral;   CHOLECYSTECTOMY  2009   fibromalga     HERNIA REPAIR  8588   umbilical   SKIN GRAFT  5027   TONSILLECTOMY      Prior to Admission medications   Medication Sig Start Date End Date Taking? Authorizing Provider  albuterol (VENTOLIN HFA) 108 (90 Base) MCG/ACT inhaler TAKE 2 PUFFS BY MOUTH EVERY 6 HOURS AS NEEDED FOR WHEEZE OR SHORTNESS OF BREATH 02/10/20  Yes Karamalegos, Devonne Doughty, DO  clobetasol cream (TEMOVATE) 7.41 % Apply 1 application topically 2 (two) times  daily. For up to 1-2 weeks then as needed. Do not use on sensitive skin. 04/24/19  Yes Karamalegos, Alexander J, DO  COLLAGEN-BORON-HYALURONIC ACID PO Take by mouth.   Yes [provider]  cyclobenzaprine (FLEXERIL) 10 MG tablet TAKE 1 TABLET BY MOUTH AT BEDTIME AS NEEDED FOR MUSCLE SPASMS. 01/04/21  Yes Karamalegos, Devonne Doughty, DO  diclofenac (VOLTAREN) 75 MG EC tablet Take 1 tablet (75 mg total) by mouth 2 (two) times daily as needed for moderate pain. 11/16/20  Yes Cook, Jayce G, DO  EPINEPHrine 0.3 mg/0.3 mL IJ SOAJ injection 0.3 mg as needed.  09/25/08  Yes [provider]  estradiol (ESTRACE) 1 MG tablet TAKE 1 TABLET BY MOUTH EVERY DAY 09/12/20  Yes Rubie Maid, MD  loratadine (CLARITIN) 10 MG tablet Take 10 mg by mouth daily as needed. 10/13/20  Yes [provider]  Loratadine 10 MG CAPS Take by mouth.   Yes [provider]  meloxicam (MOBIC) 15 MG tablet Take 1 tablet (15 mg total) by mouth daily. 01/16/21  Yes Hyatt, Max T, DPM  methocarbamol (ROBAXIN) 500 MG tablet TAKE 1-2 TABLETS (500-1,000 MG TOTAL) BY MOUTH DAILY AS NEEDED FOR MUSCLE SPASMS. 01/04/21  Yes Karamalegos, Devonne Doughty, DO  Misc Natural Products (OSTEO BI-FLEX ADV  TRIPLE ST PO) Take by mouth.   Yes [provider]  Multiple Vitamins-Minerals (MULTIVITAMIN WOMEN PO) Take by mouth.   Yes [provider]  omeprazole (PRILOSEC) 40 MG capsule Take 40 mg by mouth as needed.   Yes [provider]  ondansetron (ZOFRAN-ODT) 8 MG disintegrating tablet Take 1 tablet (8 mg total) by mouth every 8 (eight) hours as needed for nausea or vomiting. 11/03/20  Yes Danford, Valetta Fuller D, NP  oxymetazoline (AFRIN) 0.05 % nasal spray Place 1 spray into both nostrils as needed.    Yes [provider]  Probiotic Product (TRUBIOTICS) CAPS Take 1 capsule by mouth daily.   Yes [provider]  progesterone (PROMETRIUM) 200 MG capsule TAKE 1 CAPSULE BY MOUTH EVERY DAY 08/02/20  Yes  Rubie Maid, MD  triamcinolone cream (KENALOG) 0.1 % APPLY THIN FILM TWICE DAILY TO ECZEMA UNTIL CLEAR 12/23/18  Yes [provider]  TURMERIC PO Take by mouth daily.   Yes [provider]  Vitamin D, Ergocalciferol, (DRISDOL) 1.25 MG (50000 UNIT) CAPS capsule TAKE ONE CAPSULE BY MOUTH ONCE PER WEEK 12/29/20  Yes Danford, Katy D, NP  methylPREDNISolone (MEDROL DOSEPAK) 4 MG TBPK tablet 6 day dose pack - take as directed 01/16/21   Hyatt, Max T, DPM  Semaglutide (RYBELSUS) 3 MG TABS Take 3 mg by mouth daily. 12/29/20   Mina Marble D, NP    Allergies as of 12/30/2020 - Review Complete 12/30/2020  Allergen Reaction Noted   Contrast media [iodinated diagnostic agents]  12/20/2014   Codeine Nausea Only 04/22/2017   Other Hives 06/21/2015    Family History  Problem Relation Age of Onset   Diabetes Mother    Hyperlipidemia Mother    Hypertension Mother    Sleep apnea Mother    Obesity Mother    Cancer Father        lung CA   Heart disease Maternal Grandmother    Stroke Maternal Grandmother    Hypertension Maternal Grandmother    Diabetes Maternal Grandmother    Heart disease Maternal Grandfather    Diabetes Maternal Grandfather    Heart attack Maternal Grandfather     Social History   Socioeconomic History   Marital status: Married    Spouse name: Not on file   Number of children: Not on file   Years of education: Not on file   Highest education level: Not on file  Occupational History   Occupation: pet sitter/dog walker  Tobacco Use   Smoking status: Never   Smokeless tobacco: Never  Vaping Use   Vaping Use: Never used  Substance and Sexual Activity   Alcohol use: Yes    Comment: rare   Drug use: No   Sexual activity: Yes    Birth control/protection: Surgical  Other Topics Concern   Not on file  Social History Narrative   Not on file   Social Determinants of Health   Financial Resource Strain: Not on file  Food Insecurity: Not on file   Transportation Needs: Not on file  Physical Activity: Not on file  Stress: Not on file  Social Connections: Not on file  Intimate Partner Violence: Not on file    Review of Systems: See HPI, otherwise negative ROS  Physical Exam: BP (!) 148/87   Pulse 78   Temp (!) 97.2 F (36.2 C) (Temporal)   Resp 16   Ht 5' 8"  (1.727 m)   Wt 113.4 kg   LMP 05/26/2016 (Exact Date)  SpO2 97%   BMI 38.01 kg/m  General:   Alert,  pleasant and cooperative in NAD Head:  Normocephalic and atraumatic. Neck:  Supple; no masses or thyromegaly. Lungs:  Clear throughout to auscultation.    Heart:  Regular rate and rhythm. Abdomen:  Soft, nontender and nondistended. Normal bowel sounds, without guarding, and without rebound.   Neurologic:  Alert and  oriented x4;  grossly normal neurologically.  Impression/Plan: BERANIA PEEDIN is here for an colonoscopy to be performed for colon cancer screening  Risks, benefits, limitations, and alternatives regarding  colonoscopy have been reviewed with the patient.  Questions have been answered.  All parties agreeable.   Sherri Sear, MD  01/26/2021, 7:54 AM

## 2021-01-26 NOTE — Anesthesia Postprocedure Evaluation (Signed)
Anesthesia Post Note  Patient: Caitlin Barrett  Procedure(s) Performed: COLONOSCOPY WITH PROPOFOL  Patient location during evaluation: PACU Anesthesia Type: General Level of consciousness: awake and awake and alert Pain management: pain level controlled Vital Signs Assessment: post-procedure vital signs reviewed and stable Respiratory status: spontaneous breathing and respiratory function stable Cardiovascular status: blood pressure returned to baseline Anesthetic complications: no   No notable events documented.   Last Vitals:  Vitals:   01/26/21 0857 01/26/21 0907  BP: 120/76 (!) 141/86  Pulse: 85 73  Resp: 20 16  Temp: (!) 35.6 C   SpO2: 100% 100%    Last Pain:  Vitals:   01/26/21 0907  TempSrc:   PainSc: 0-No pain                 VAN STAVEREN,Detta Mellin

## 2021-01-27 ENCOUNTER — Encounter: Payer: Self-pay | Admitting: Gastroenterology

## 2021-01-27 LAB — SURGICAL PATHOLOGY

## 2021-01-30 ENCOUNTER — Encounter: Payer: Self-pay | Admitting: Gastroenterology

## 2021-02-01 ENCOUNTER — Encounter (INDEPENDENT_AMBULATORY_CARE_PROVIDER_SITE_OTHER): Payer: Self-pay | Admitting: Adult Health

## 2021-02-01 ENCOUNTER — Ambulatory Visit (INDEPENDENT_AMBULATORY_CARE_PROVIDER_SITE_OTHER): Payer: No Typology Code available for payment source | Admitting: Adult Health

## 2021-02-01 ENCOUNTER — Other Ambulatory Visit: Payer: Self-pay

## 2021-02-01 VITALS — BP 132/80 | HR 69 | Temp 97.6°F | Ht 69.0 in | Wt 249.0 lb

## 2021-02-01 DIAGNOSIS — Z6841 Body Mass Index (BMI) 40.0 and over, adult: Secondary | ICD-10-CM

## 2021-02-01 DIAGNOSIS — E8881 Metabolic syndrome: Secondary | ICD-10-CM

## 2021-02-01 DIAGNOSIS — Z9189 Other specified personal risk factors, not elsewhere classified: Secondary | ICD-10-CM

## 2021-02-01 DIAGNOSIS — E559 Vitamin D deficiency, unspecified: Secondary | ICD-10-CM

## 2021-02-01 MED ORDER — VITAMIN D (ERGOCALCIFEROL) 1.25 MG (50000 UNIT) PO CAPS: ORAL_CAPSULE | ORAL | 0 refills | Status: DC

## 2021-02-01 MED ORDER — METFORMIN HCL 500 MG PO TABS: ORAL_TABLET | ORAL | 0 refills | Status: DC

## 2021-02-01 NOTE — Progress Notes (Signed)
Chief Complaint:   OBESITY Caitlin Barrett is here to discuss her progress with her obesity treatment plan along with follow-up of her obesity related diagnoses. Caitlin Barrett is on the Category 2 Plan and states she is following her eating plan approximately 80% of the time. Caitlin Barrett states she is walking for 30 minutes 2 times per week.  Today's visit was #: 22 Starting weight: 277 lbs Starting date: 08/13/2019 Today's weight: 249 lbs Today's date: 02/01/2021 Total lbs lost to date: 28 lbs Total lbs lost since last in-office visit: 0  Interim History:  Caitlin Barrett ate off plan over the last 1.5 weeks.   -She had a colonoscopy and the prep required an altered. -She received her COVID bivalent booster and experienced SE that lowered her appetite.  Of note:  Caitlin Barrett (dog) 68 years old- currently in good health.  Subjective:   1. Vitamin D deficiency Discussed labs with patient today.  Vitamin D level on 12/29/2020 - 36.9 - below goal of 50. She is currently taking prescription ergocalciferol 50,000 IU each week. She denies nausea, vomiting or muscle weakness.  2. Insulin resistance Worsening.  Discussed labs with patient today.  On 12/29/2020 - BG 88, A1c 5.3, GFR 87, Insulin 10.1 - up from 8.9 on 06/09/20. Insurance denied Rybelsus. Insulin level remains elevated despite lifestyle modification.  3. At risk for diarrhea Caitlin Barrett is at higher risk of diarrhea due starting metformin.  Assessment/Plan:   1. Vitamin D deficiency Refill ergocalciferol 50,000 IU twice weekly, as per below.  - Refill Vitamin D, Ergocalciferol, (DRISDOL) 1.25 MG (50000 UNIT) CAPS capsule; TAKE ONE CAPSULE BY MOUTH TWICE PER WEEK  Dispense: 8 capsule; Refill: 0  2. Insulin resistance Start metformin 500 mg 1/2 tablet with breakfast for 1 week, then increase to 1 full tablet with breakfast.  - Start metFORMIN (GLUCOPHAGE) 500 MG tablet; 1/2 tab with breakfast for one week, then increase to 1 full tab with  breakfast- hold at this dose.  Dispense: 30 tablet; Refill: 0  3. At risk for diarrhea Caitlin Barrett was given approximately 15 minutes of diarrhea prevention counseling today. She is 50 y.o. female and has risk factors for diarrhea including medications and changes in diet. We discussed intensive lifestyle modifications today with an emphasis on specific weight loss instructions including dietary strategies.   Repetitive spaced learning was employed today to elicit superior memory formation and behavioral change.   4. Obesity, current BMI 36.9  Caitlin Barrett is currently in the action stage of change. As such, her goal is to continue with weight loss efforts. She has agreed to the Category 2 Plan.   Exercise goals:  As is.  Behavioral modification strategies: increasing lean protein intake, decreasing simple carbohydrates, meal planning and cooking strategies, keeping healthy foods in the home, and planning for success.  Caitlin Barrett has agreed to follow-up with our clinic in 4 weeks. She was informed of the importance of frequent follow-up visits to maximize her success with intensive lifestyle modifications for her multiple health conditions.   Objective:   Blood pressure 132/80, pulse 69, temperature 97.6 F (36.4 C), height 5' 9"  (1.753 m), weight 249 lb (112.9 kg), last menstrual period 05/26/2016, SpO2 99 %. Body mass index is 36.77 kg/m.  General: Cooperative, alert, well developed, in no acute distress. HEENT: Conjunctivae and lids unremarkable. Cardiovascular: Regular rhythm.  Lungs: Normal work of breathing. Neurologic: No focal deficits.   Lab Results  Component Value Date   CREATININE 0.83 12/29/2020   BUN 22 12/29/2020  NA 141 12/29/2020   K 4.7 12/29/2020   CL 103 12/29/2020   CO2 23 12/29/2020   Lab Results  Component Value Date   ALT 11 12/29/2020   AST 12 12/29/2020   ALKPHOS 48 12/29/2020   BILITOT 0.3 12/29/2020   Lab Results  Component Value Date   HGBA1C 5.3  12/29/2020   HGBA1C 5.3 03/31/2020   HGBA1C 5.5 12/15/2019   HGBA1C 5.3 08/13/2019   HGBA1C 5.3 10/23/2017   Lab Results  Component Value Date   INSULIN 10.1 12/29/2020   INSULIN 8.9 06/09/2020   INSULIN 12.4 03/31/2020   INSULIN 13.5 12/15/2019   INSULIN 19.0 08/13/2019   Lab Results  Component Value Date   TSH 0.081 (L) 08/13/2019   Lab Results  Component Value Date   CHOL 246 (H) 06/09/2020   HDL 63 06/09/2020   LDLCALC 168 (H) 06/09/2020   TRIG 89 06/09/2020   CHOLHDL 3.9 06/09/2020   Lab Results  Component Value Date   VD25OH 35.9 12/29/2020   VD25OH 47.8 07/28/2020   VD25OH 49.2 06/09/2020   Lab Results  Component Value Date   WBC 9.8 03/31/2020   HGB 14.2 03/31/2020   HCT 40.9 03/31/2020   MCV 88 03/31/2020   PLT 394 03/31/2020   Lab Results  Component Value Date   IRON 64 08/13/2019   TIBC 333 08/13/2019   FERRITIN 30 08/13/2019   Attestation Statements:   Reviewed by clinician on day of visit: allergies, medications, problem list, medical history, surgical history, family history, social history, and previous encounter notes.  I, Water quality scientist, CMA, am acting as Location manager for Mina Marble, NP.  I have reviewed the above documentation for accuracy and completeness, and I agree with the above. -  Kimetha Trulson d. Tasnia Spegal, NP-C

## 2021-02-20 ENCOUNTER — Encounter: Payer: Self-pay | Admitting: Unknown Physician Specialty

## 2021-02-21 ENCOUNTER — Other Ambulatory Visit (INDEPENDENT_AMBULATORY_CARE_PROVIDER_SITE_OTHER): Payer: Self-pay | Admitting: Adult Health

## 2021-02-21 DIAGNOSIS — E559 Vitamin D deficiency, unspecified: Secondary | ICD-10-CM

## 2021-02-21 NOTE — Telephone Encounter (Signed)
LOV with Valetta Fuller

## 2021-02-23 ENCOUNTER — Other Ambulatory Visit (INDEPENDENT_AMBULATORY_CARE_PROVIDER_SITE_OTHER): Payer: Self-pay | Admitting: Adult Health

## 2021-02-23 DIAGNOSIS — E8881 Metabolic syndrome: Secondary | ICD-10-CM

## 2021-02-27 ENCOUNTER — Ambulatory Visit: Payer: No Typology Code available for payment source | Admitting: Podiatry

## 2021-02-28 NOTE — Discharge Instructions (Signed)
[facility]  What is Functional Endoscopic Sinus Surgery?  The Surgery involves making the natural openings of the sinuses larger by removing the bony partitions that separate the sinuses from the nasal cavity.  The natural sinus lining is preserved as much as possible to allow the sinuses to resume normal function after the surgery.  In some patients nasal polyps (excessively swollen lining of the sinuses) may be removed to relieve obstruction of the sinus openings.  The surgery is performed through the nose using lighted scopes, which eliminates the need for incisions on the face.  A septoplasty is a different procedure which is sometimes performed with sinus surgery.  It involves straightening the boy partition that separates the two sides of your nose.  A crooked or deviated septum may need repair if is obstructing the sinuses or nasal airflow.  Turbinate reduction is also often performed during sinus surgery.  The turbinates are bony proturberances from the side walls of the nose which swell and can obstruct the nose in patients with sinus and allergy problems.  Their size can be surgically reduced to help relieve nasal obstruction.  What Can Sinus Surgery Do For Me?  Sinus surgery can reduce the frequency of sinus infections requiring antibiotic treatment.  This can provide improvement in nasal congestion, post-nasal drainage, facial pressure and nasal obstruction.  Surgery will NOT prevent you from ever having an infection again, so it usually only for patients who get infections 4 or more times yearly requiring antibiotics, or for infections that do not clear with antibiotics.  It will not cure nasal allergies, so patients with allergies may still require medication to treat their allergies after surgery. Surgery may improve headaches related to sinusitis, however, some people will continue to  require medication to control sinus headaches related to allergies.  Surgery will do nothing for other forms of headache (migraine, tension or cluster).  What Are the Risks of Endoscopic Sinus Surgery?  Current techniques allow surgery to be performed safely with little risk, however, there are rare complications that patients should be aware of.  Because the sinuses are located around the eyes, there is risk of eye injury, including blindness, though again, this would be quite rare. This is usually a result of bleeding behind the eye during surgery, which can effect vision, though there are treatments to protect the vision and prevent permanent injury. More serious complications would include bleeding inside the brain cavity or damage to the brain.This happens when the fluid around the brain leaks out into the sinus cavity.  Again, all of these complications are uncommon, and spinal fluid leaks can be safely managed surgically if they occur.  The most common complication of sinus surgery is bleeding from the nose, which may require packing or cauterization of the nose.  Patients with polyps may experience recurrence of the polyps that would require revision surgery.  Alterations of sense of smell or injury to the tear ducts are also rare complications.   What is the Surgery Like, and what is the Recovery?  The Surgery usually takes a couple of hours to perform, and is usually performed under a general anesthetic (completely asleep).  Patients are usually discharged home after a couple of hours.  Sometimes during surgery it is necessary to pack the nose to control bleeding, and the packing is left in place for 24 - 48 hours, and removed by your surgeon.  If  a septoplasty was performed during the procedure, there is often a splint placed which must be removed after 5-7 days.   Discomfort: Pain is usually mild to moderate, and can be controlled by prescription pain medication or acetaminophen (Tylenol).   Aspirin, Ibuprofen (Advil, Motrin), or Naprosyn (Aleve) should be avoided, as they can cause increased bleeding.  Most patients feel sinus pressure like they have a bad head cold for several days.  Sleeping with your head elevated can help reduce swelling and facial pressure, as can ice packs over the face.  A humidifier may be helpful to keep the mucous and blood from drying in the nose.   Diet: There are no specific diet restrictions, however, you should generally start with clear liquids and a light diet of bland foods because the anesthetic can cause some nausea.  Advance your diet depending on how your stomach feels.  Taking your pain medication with food will often help reduce stomach upset which pain medications can cause.  Nasal Saline Irrigation: It is important to remove blood clots and dried mucous from the nose as it is healing.  This is done by having you irrigate the nose at least 3 - 4 times daily with a salt water solution.  We recommend using NeilMed Sinus Rinse (available at the drug store).  Fill the squeeze bottle with the solution, bend over a sink, and insert the tip of the squeeze bottle into the nose  of an inch.  Point the tip of the squeeze bottle towards the inside corner of the eye on the same side your irrigating.  Squeeze the bottle and gently irrigate the nose.  If you bend forward as you do this, most of the fluid will flow back out of the nose, instead of down your throat.   The solution should be warm, near body temperature, when you irrigate.   Each time you irrigate, you should use a full squeeze bottle.   Note that if you are instructed to use Nasal Steroid Sprays at any time after your surgery, irrigate with saline BEFORE using the steroid spray, so you do not wash it all out of the nose. Another product, Nasal Saline Gel (such as AYR Nasal Saline Gel) can be applied in each nostril 3 - 4 times daily to moisture the nose and reduce scabbing or crusting.  Bleeding:   Bloody drainage from the nose can be expected for several days, and patients are instructed to irrigate their nose frequently with salt water to help remove mucous and blood clots.  The drainage may be dark red or brown, though some fresh blood may be seen intermittently, especially after irrigation.  Do not blow you nose, as bleeding may occur. If you must sneeze, keep your mouth open to allow air to escape through your mouth.  If heavy bleeding occurs: Irrigate the nose with saline to rinse out clots, then spray the nose 3 - 4 times with Afrin Nasal Decongestant Spray.  The spray will constrict the blood vessels to slow bleeding.  Pinch the lower half of your nose shut to apply pressure, and lay down with your head elevated.  Ice packs over the nose may help as well. If bleeding persists despite these measures, you should notify your doctor.  Do not use the Afrin routinely to control nasal congestion after surgery, as it can result in worsening congestion and may affect healing.     Activity: Return to work varies among patients. Most patients will be out  of work at least 5 - 7 days to recover.  Patient may return to work after they are off of narcotic pain medication, and feeling well enough to perform the functions of their job.  Patients must avoid heavy lifting (over 10 pounds) or strenuous physical for 2 weeks after surgery, so your employer may need to assign you to light duty, or keep you out of work longer if light duty is not possible.  NOTE: you should not drive, operate dangerous machinery, do any mentally demanding tasks or make any important legal or financial decisions while on narcotic pain medication and recovering from the general anesthetic.    Call Your Doctor Immediately if You Have Any of the Following: Bleeding that you cannot control with the above measures Loss of vision, double vision, bulging of the eye or black eyes. Fever over 101 degrees Neck stiffness with severe headache,  fever, nausea and change in mental state. You are always encouraged to call anytime with concerns, however, please call with requests for pain medication refills during office hours.  Office Endoscopy: During follow-up visits your doctor will remove any packing or splints that may have been placed and evaluate and clean your sinuses endoscopically.  Topical anesthetic will be used to make this as comfortable as possible, though you may want to take your pain medication prior to the visit.  How often this will need to be done varies from patient to patient.  After complete recovery from the surgery, you may need follow-up endoscopy from time to time, particularly if there is concern of recurrent infection or nasal polyps.

## 2021-03-01 ENCOUNTER — Other Ambulatory Visit: Payer: Self-pay

## 2021-03-01 ENCOUNTER — Ambulatory Visit (INDEPENDENT_AMBULATORY_CARE_PROVIDER_SITE_OTHER): Payer: No Typology Code available for payment source | Admitting: Adult Health

## 2021-03-01 ENCOUNTER — Encounter (INDEPENDENT_AMBULATORY_CARE_PROVIDER_SITE_OTHER): Payer: Self-pay | Admitting: Adult Health

## 2021-03-01 VITALS — BP 138/85 | HR 75 | Temp 97.6°F | Ht 69.0 in | Wt 250.0 lb

## 2021-03-01 DIAGNOSIS — E88819 Insulin resistance, unspecified: Secondary | ICD-10-CM

## 2021-03-01 DIAGNOSIS — Z9189 Other specified personal risk factors, not elsewhere classified: Secondary | ICD-10-CM | POA: Diagnosis not present

## 2021-03-01 DIAGNOSIS — Z6841 Body Mass Index (BMI) 40.0 and over, adult: Secondary | ICD-10-CM

## 2021-03-01 DIAGNOSIS — E559 Vitamin D deficiency, unspecified: Secondary | ICD-10-CM

## 2021-03-01 DIAGNOSIS — E8881 Metabolic syndrome: Secondary | ICD-10-CM | POA: Diagnosis not present

## 2021-03-01 MED ORDER — VITAMIN D (ERGOCALCIFEROL) 1.25 MG (50000 UNIT) PO CAPS: ORAL_CAPSULE | ORAL | 0 refills | Status: DC

## 2021-03-01 MED ORDER — METFORMIN HCL 500 MG PO TABS: ORAL_TABLET | ORAL | 0 refills | Status: DC

## 2021-03-01 NOTE — Progress Notes (Signed)
Chief Complaint:   OBESITY Caitlin Barrett is here to discuss her progress with her obesity treatment plan along with follow-up of her obesity related diagnoses. Caitlin Barrett is on the Category 2 Plan and states she is following her eating plan approximately 85% of the time. Caitlin Barrett states she is walking for 30 minutes 3 times per week.  Today's visit was #: 23 Starting weight: 277 lbs Starting date: 08/13/2019 Today's weight: 250 lbs Today's date: 03/01/2021 Total lbs lost to date: 27 lbs Total lbs lost since last in-office visit: 0  Interim History:  Caitlin Barrett will have nasal septoplasty with submucosal resection of turbinates on 03/03/2021. Out pt procedure at a surgical center.   She has filled her kitchen with easily digestable protein sources. Her husband will be available to aid her in her recovery. 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  Subjective:   1. Vitamin D deficiency On 12/29/2020, vitamin D level - 35.9 - well below goal of 50. She is currently taking prescription ergocalciferol 50,000 IU twice weekly. She denies nausea, vomiting or muscle weakness.  2. Insulin resistance On 12/29/2020, insulin level 10.1, increased from 8.9 on 06/09/2020. She was started on metformin on 02/01/2021, titrated up to full 500 mg tablet with breakfast - tolerating well! If GI Upset occurs s/p nasal septoplasty, then okay to hold Metformin until resolution of sx's.  3. At risk for deficient intake of food The patient is at a higher than average risk of deficient intake of food due to 03/03/2021 nasal septoplasty with submucosal resection of turbinates on 03/03/2021. Increase protein before/after procedure.   Assessment/Plan:   1. Vitamin D deficiency Check labs at next office visit. Refill ergocalciferol 50,000 IU twice weekly, as per below.  - Refill Vitamin D, Ergocalciferol, (DRISDOL) 1.25 MG (50000 UNIT) CAPS capsule; TAKE ONE CAPSULE BY MOUTH TWICE PER  WEEK  Dispense: 8 capsule; Refill: 0  2. Insulin resistance Refill metformin 500 mg with breakfast.  - Refill metFORMIN (GLUCOPHAGE) 500 MG tablet; 1 tab with breakfast  Dispense: 30 tablet; Refill: 0  3. At risk for deficient intake of food Caitlin Barrett was given approximately 15 minutes of deficit intake of food prevention counseling today. Caitlin Barrett is at risk for eating too few calories based on current food recall. She was encouraged to focus on meeting caloric and protein goals according to her recommended meal plan.   4. Obesity, current BMI 37.0  Caitlin Barrett is currently in the action stage of change. As such, her goal is to continue with weight loss efforts. She has agreed to the Category 2 Plan.   Exercise goals:  As is.  Behavioral modification strategies: increasing lean protein intake, decreasing simple carbohydrates, meal planning and cooking strategies, keeping healthy foods in the home, and planning for success.  [name] has agreed to follow-up with our clinic in 4 weeks. She was informed of the importance of frequent follow-up visits to maximize her success with intensive lifestyle modifications for her multiple health conditions.   Objective:   Blood pressure 138/85, pulse 75, temperature 97.6 F (36.4 C), height 5' 9"  (1.753 m), weight 250 lb (113.4 kg), last menstrual period 05/26/2016, SpO2 98 %. Body mass index is 36.92 kg/m.  General: Cooperative, alert, well developed, in no acute distress. HEENT: Conjunctivae and lids unremarkable. Cardiovascular: Regular rhythm.  Lungs: Normal work of breathing. Neurologic: No focal deficits.   Lab Results  Component Value Date   CREATININE 0.83 12/29/2020  BUN 22 12/29/2020   NA 141 12/29/2020   K 4.7 12/29/2020   CL 103 12/29/2020   CO2 23 12/29/2020   Lab Results  Component Value Date   ALT 11 12/29/2020   AST 12 12/29/2020   ALKPHOS 48 12/29/2020   BILITOT 0.3 12/29/2020   Lab Results  Component Value Date    HGBA1C 5.3 12/29/2020   HGBA1C 5.3 03/31/2020   HGBA1C 5.5 12/15/2019   HGBA1C 5.3 08/13/2019   HGBA1C 5.3 10/23/2017   Lab Results  Component Value Date   INSULIN 10.1 12/29/2020   INSULIN 8.9 06/09/2020   INSULIN 12.4 03/31/2020   INSULIN 13.5 12/15/2019   INSULIN 19.0 08/13/2019   Lab Results  Component Value Date   TSH 0.081 (L) 08/13/2019   Lab Results  Component Value Date   CHOL 246 (H) 06/09/2020   HDL 63 06/09/2020   LDLCALC 168 (H) 06/09/2020   TRIG 89 06/09/2020   CHOLHDL 3.9 06/09/2020   Lab Results  Component Value Date   VD25OH 35.9 12/29/2020   VD25OH 47.8 07/28/2020   VD25OH 49.2 06/09/2020   Lab Results  Component Value Date   WBC 9.8 03/31/2020   HGB 14.2 03/31/2020   HCT 40.9 03/31/2020   MCV 88 03/31/2020   PLT 394 03/31/2020   Lab Results  Component Value Date   IRON 64 08/13/2019   TIBC 333 08/13/2019   FERRITIN 30 08/13/2019   Attestation Statements:   Reviewed by clinician on day of visit: allergies, medications, problem list, medical history, surgical history, family history, social history, and previous encounter notes.  I, Water quality scientist, CMA, am acting as Location manager for Mina Marble, NP.  I have reviewed the above documentation for accuracy and completeness, and I agree with the above. -  Shaterica Mcclatchy d. Jaylina Ramdass, NP-C

## 2021-03-03 ENCOUNTER — Encounter: Payer: Self-pay | Admitting: Unknown Physician Specialty

## 2021-03-03 ENCOUNTER — Ambulatory Visit
Admission: RE | Admit: 2021-03-03 | Discharge: 2021-03-03 | Disposition: A | Discharge status: Home / Self Care | Payer: PRIVATE HEALTH INSURANCE | Attending: Unknown Physician Specialty | Admitting: Unknown Physician Specialty

## 2021-03-03 ENCOUNTER — Ambulatory Visit: Payer: PRIVATE HEALTH INSURANCE | Admitting: Anesthesiology

## 2021-03-03 ENCOUNTER — Other Ambulatory Visit: Payer: Self-pay

## 2021-03-03 ENCOUNTER — Encounter
Admission: RE | Disposition: A | Discharge status: Home / Self Care | Payer: Self-pay | Source: Home / Self Care | Attending: Unknown Physician Specialty

## 2021-03-03 DIAGNOSIS — Z6837 Body mass index (BMI) 37.0-37.9, adult: Secondary | ICD-10-CM | POA: Diagnosis not present

## 2021-03-03 DIAGNOSIS — J45909 Unspecified asthma, uncomplicated: Secondary | ICD-10-CM | POA: Insufficient documentation

## 2021-03-03 DIAGNOSIS — J3489 Other specified disorders of nose and nasal sinuses: Secondary | ICD-10-CM | POA: Insufficient documentation

## 2021-03-03 DIAGNOSIS — K219 Gastro-esophageal reflux disease without esophagitis: Secondary | ICD-10-CM | POA: Diagnosis not present

## 2021-03-03 HISTORY — PX: NASAL SEPTOPLASTY W/ TURBINOPLASTY: SHX2070

## 2021-03-03 LAB — GLUCOSE, CAPILLARY: Glucose-Capillary: 104 mg/dL — ABNORMAL HIGH (ref 70–99)

## 2021-03-03 SURGERY — SEPTOPLASTY, NOSE, WITH NASAL TURBINATE REDUCTION
Anesthesia: General | Site: Nose | Laterality: Bilateral

## 2021-03-03 MED ORDER — LIDOCAINE-EPINEPHRINE 1 %-1:100000 IJ SOLN
INTRAMUSCULAR | Status: DC | PRN
  Administered 2021-03-03: 12 mL

## 2021-03-03 MED ORDER — SULFAMETHOXAZOLE-TRIMETHOPRIM 800-160 MG PO TABS: 1.0000 | ORAL_TABLET | Freq: Two times a day (BID) | ORAL | 0 refills | Status: DC

## 2021-03-03 MED ORDER — FENTANYL CITRATE PF 50 MCG/ML IJ SOSY
25.0000 ug | PREFILLED_SYRINGE | INTRAMUSCULAR | Status: DC | PRN
  Administered 2021-03-03: 25 ug via INTRAVENOUS

## 2021-03-03 MED ORDER — ACETAMINOPHEN 10 MG/ML IV SOLN
1000.0000 mg | Freq: Once | INTRAVENOUS | Status: AC
  Administered 2021-03-03: 1000 mg via INTRAVENOUS

## 2021-03-03 MED ORDER — LACTATED RINGERS IV SOLN: INTRAVENOUS | Status: DC

## 2021-03-03 MED ORDER — MIDAZOLAM HCL 5 MG/5ML IJ SOLN
INTRAMUSCULAR | Status: DC | PRN
  Administered 2021-03-03: 2 mg via INTRAVENOUS

## 2021-03-03 MED ORDER — GLYCOPYRROLATE 0.2 MG/ML IJ SOLN
INTRAMUSCULAR | Status: DC | PRN
  Administered 2021-03-03: .1 mg via INTRAVENOUS

## 2021-03-03 MED ORDER — OXYCODONE HCL 5 MG PO TABS: 5.0000 mg | ORAL_TABLET | Freq: Once | ORAL | Status: AC | PRN

## 2021-03-03 MED ORDER — HYDROCODONE-ACETAMINOPHEN 5-300 MG PO TABS: 1.0000 | ORAL_TABLET | ORAL | 0 refills | Status: DC | PRN

## 2021-03-03 MED ORDER — SCOPOLAMINE 1 MG/3DAYS TD PT72
1.0000 | MEDICATED_PATCH | Freq: Once | TRANSDERMAL | Status: DC
  Administered 2021-03-03: 1.5 mg via TRANSDERMAL

## 2021-03-03 MED ORDER — PROPOFOL 10 MG/ML IV BOLUS
INTRAVENOUS | Status: DC | PRN
  Administered 2021-03-03: 170 mg via INTRAVENOUS
  Administered 2021-03-03: 30 mg via INTRAVENOUS

## 2021-03-03 MED ORDER — OXYCODONE HCL 5 MG/5ML PO SOLN
5.0000 mg | Freq: Once | ORAL | Status: AC | PRN
  Administered 2021-03-03: 5 mg via ORAL

## 2021-03-03 MED ORDER — FENTANYL CITRATE (PF) 100 MCG/2ML IJ SOLN
INTRAMUSCULAR | Status: DC | PRN
  Administered 2021-03-03: 100 ug via INTRAVENOUS

## 2021-03-03 MED ORDER — OXYMETAZOLINE HCL 0.05 % NA SOLN
6.0000 | Freq: Once | NASAL | Status: AC
  Administered 2021-03-03: 6 via NASAL

## 2021-03-03 MED ORDER — DEXAMETHASONE SODIUM PHOSPHATE 4 MG/ML IJ SOLN
INTRAMUSCULAR | Status: DC | PRN
  Administered 2021-03-03: 10 mg via INTRAVENOUS

## 2021-03-03 MED ORDER — PHENYLEPHRINE HCL 0.5 % NA SOLN
NASAL | Status: DC | PRN
  Administered 2021-03-03: 30 mL via TOPICAL

## 2021-03-03 MED ORDER — BACITRACIN 500 UNIT/GM EX OINT
TOPICAL_OINTMENT | CUTANEOUS | Status: DC | PRN
  Administered 2021-03-03: 1 via TOPICAL

## 2021-03-03 MED ORDER — ONDANSETRON HCL 4 MG PO TABS: 4.0000 mg | ORAL_TABLET | Freq: Every day | ORAL | 1 refills | Status: AC | PRN

## 2021-03-03 MED ORDER — ONDANSETRON HCL 4 MG/2ML IJ SOLN: 4.0000 mg | Freq: Once | INTRAMUSCULAR | Status: DC | PRN

## 2021-03-03 MED ORDER — ONDANSETRON HCL 4 MG/2ML IJ SOLN
INTRAMUSCULAR | Status: DC | PRN
  Administered 2021-03-03: 4 mg via INTRAVENOUS

## 2021-03-03 MED ORDER — SUCCINYLCHOLINE CHLORIDE 200 MG/10ML IV SOSY
PREFILLED_SYRINGE | INTRAVENOUS | Status: DC | PRN
  Administered 2021-03-03: 100 mg via INTRAVENOUS

## 2021-03-03 MED ORDER — LIDOCAINE HCL (CARDIAC) PF 100 MG/5ML IV SOSY
PREFILLED_SYRINGE | INTRAVENOUS | Status: DC | PRN
  Administered 2021-03-03: 50 mg via INTRAVENOUS

## 2021-03-03 SURGICAL SUPPLY — 22 items
COAG SUCT 10F 3.5MM HAND CTRL (MISCELLANEOUS) ×2 IMPLANT
DRAPE HEAD BAR (DRAPES) ×2 IMPLANT
DRESSING NASL FOAM PST OP SINU (MISCELLANEOUS) IMPLANT
DRSG NASAL FOAM POST OP SINU (MISCELLANEOUS) ×4
ELECT REM PT RETURN 9FT ADLT (ELECTROSURGICAL) ×2
ELECTRODE REM PT RTRN 9FT ADLT (ELECTROSURGICAL) ×1 IMPLANT
GLOVE SURG ENC TEXT LTX SZ7.5 (GLOVE) ×5 IMPLANT
HANDLE YANKAUER SUCT BULB TIP (MISCELLANEOUS) ×2 IMPLANT
KIT TURNOVER KIT A (KITS) ×2 IMPLANT
NDL HYPO 25GX1X1/2 BEV (NEEDLE) ×1 IMPLANT
NEEDLE HYPO 25GX1X1/2 BEV (NEEDLE) ×2 IMPLANT
PACK ENT CUSTOM (PACKS) ×2 IMPLANT
SPLINT NASAL SEPTAL BLV .50 ST (MISCELLANEOUS) ×1 IMPLANT
SPONGE NEURO XRAY DETECT 1X3 (DISPOSABLE) ×2 IMPLANT
STRAP BODY AND KNEE 60X3 (MISCELLANEOUS) ×2 IMPLANT
SUT CHROMIC 3-0 (SUTURE) ×2
SUT CHROMIC 3-0 KS 27XMFL CR (SUTURE) ×1
SUT ETHILON 3-0 KS 30 BLK (SUTURE) ×2 IMPLANT
SUTURE CHRMC 3-0 KS 27XMFL CR (SUTURE) ×1 IMPLANT
SYR 10ML LL (SYRINGE) ×2 IMPLANT
TOWEL OR 17X26 4PK STRL BLUE (TOWEL DISPOSABLE) ×2 IMPLANT
WATER STERILE IRR 250ML POUR (IV SOLUTION) ×2 IMPLANT

## 2021-03-03 NOTE — Op Note (Signed)
PREOPERATIVE DIAGNOSIS:  Chronic nasal obstruction.  POSTOPERATIVE DIAGNOSIS:  Chronic nasal obstruction.  SURGEON:  Roena Malady, M.D.  NAME OF PROCEDURE:  Nasal septoplasty. Submucous resection of inferior turbinates.  OPERATIVE FINDINGS:  Severe nasal septal deformity, hypertrophy of the inferior turbinates.   DESCRIPTION OF THE PROCEDURE:  Caitlin Barrett was identified in the holding area and taken to the operating room and placed in the supine position.  After general endotracheal anesthesia was induced, the table was turned 45 degrees and the patient was placed in a semi-Fowler position.  The nose was then topically anesthetized with Lidocaine, cotton pledgets were placed within each nostril. After approximately 5 minutes, this was removed at which time a local anesthetic of 1% Lidocaine 1:100,000 units of Epinephrine was used to inject the inferior turbinates in the nasal septum. A total of 12 ml was used. Examination of the nose showed a severe left nasal septal deformity and hypertrophied inferior turbinate.  Beginning on the right hand side a hemitransfixion incision was then created on the leading edge of the septum on the right.  A subperichondrial plane was elevated posteriorly on the left and taken back to the perpendicular plate of the ethmoid where subperiosteal plane was elevated posteriorly on the left. A large septal spur was identified on the left hand side impacting on the inferior turbinate.  An inferior rim of cartilage was removed anteriorly with care taken to leave an anterior strut to prevent nasal collapse. With this strut removed the perpendicular plate of the ethmoid was separated from the quadrangular cartilage. The large septal spur was removed.  The septum was then replaced in the midline. Reinspection through each nostril showed excellent reduction of the septal deformity. A left posterior inferior fenestration was then created to allow hematoma drainage.  With  the septoplasty completed, beginning on the left-hand side, a 15 blade was used to incise along the inferior edge of the inferior turbinate. A superior laterally based flap was then elevated. The underlying conchal bone of mucosa was excised using Knight scissors. The flap was then laid back over the turbinate stump and cauterized using suction cautery. In a similar fashion the submucous resection was performed on the right.  With the submucous resection completed bilaterally and no active bleeding, the hemitransfixion incision was then closed using two interrupted 3-0 chromic sutures.  Plastic nasal septal splints were placed within each nostril and affixed to the septum using a 3-0 nylon suture. Stammberger was then used beneath each inferior turbinate for hemostasis.    The patient tolerated the procedure well, was returned to anesthesia, extubated in the operating room, and taken to the recovery room in stable condition.    CULTURES:  None.  SPECIMENS:  None.  ESTIMATED BLOOD LOSS:  25 cc.  Roena Malady  03/03/2021  9:58 AM

## 2021-03-03 NOTE — Anesthesia Procedure Notes (Signed)
Procedure Name: Intubation Date/Time: 03/03/2021 9:20 AM Performed by: Mayme Genta, CRNA Pre-anesthesia Checklist: Patient identified, Emergency Drugs available, Suction available, Patient being monitored and Timeout performed Patient Re-evaluated:Patient Re-evaluated prior to induction Oxygen Delivery Method: Circle system utilized Preoxygenation: Pre-oxygenation with 100% oxygen Induction Type: IV induction Ventilation: Mask ventilation without difficulty Laryngoscope Size: Miller and 2 Grade View: Grade I Tube type: Oral Rae Tube size: 7.0 mm Number of attempts: 1 Placement Confirmation: ETT inserted through vocal cords under direct vision, positive ETCO2 and breath sounds checked- equal and bilateral Tube secured with: Tape Dental Injury: Teeth and Oropharynx as per pre-operative assessment

## 2021-03-03 NOTE — Anesthesia Preprocedure Evaluation (Signed)
Anesthesia Evaluation  Patient identified by MRN, date of birth, ID band Patient awake    Reviewed: Allergy & Precautions, H&P , NPO status , Patient's Chart, lab work & pertinent test results  Airway Mallampati: II  TM Distance: >3 FB Neck ROM: full    Dental no notable dental hx.    Pulmonary asthma ,    Pulmonary exam normal breath sounds clear to auscultation       Cardiovascular Normal cardiovascular exam Rhythm:regular Rate:Normal     Neuro/Psych PSYCHIATRIC DISORDERS    GI/Hepatic GERD  ,  Endo/Other  diabetesMorbid obesity  Renal/GU      Musculoskeletal   Abdominal   Peds  Hematology   Anesthesia Other Findings   Reproductive/Obstetrics                             Anesthesia Physical Anesthesia Plan  ASA: 2  Anesthesia Plan: General ETT   Post-op Pain Management:    Induction:   PONV Risk Score and Plan: 3 and Treatment may vary due to age or medical condition, Ondansetron, Dexamethasone and Scopolamine patch - Pre-op  Airway Management Planned:   Additional Equipment:   Intra-op Plan:   Post-operative Plan:   Informed Consent: I have reviewed the patients History and Physical, chart, labs and discussed the procedure including the risks, benefits and alternatives for the proposed anesthesia with the patient or authorized representative who has indicated his/her understanding and acceptance.     Dental Advisory Given  Plan Discussed with: CRNA  Anesthesia Plan Comments:         Anesthesia Quick Evaluation

## 2021-03-03 NOTE — Anesthesia Postprocedure Evaluation (Signed)
Anesthesia Post Note  Patient: Caitlin Barrett  Procedure(s) Performed: NASAL SEPTOPLASTY WITH SUBMUCOSAL RESECTION OF TURBINATE (Bilateral: Nose)     Patient location during evaluation: PACU Anesthesia Type: General Level of consciousness: awake and alert and oriented Pain management: satisfactory to patient Vital Signs Assessment: post-procedure vital signs reviewed and stable Respiratory status: spontaneous breathing, nonlabored ventilation and respiratory function stable Cardiovascular status: blood pressure returned to baseline and stable Postop Assessment: Adequate PO intake and No signs of nausea or vomiting Anesthetic complications: no   No notable events documented.  Raliegh Ip

## 2021-03-03 NOTE — H&P (Signed)
The patient's history has been reviewed, patient examined, no change in status, stable for surgery.  Questions were answered to the patients satisfaction.  

## 2021-03-03 NOTE — Transfer of Care (Signed)
Immediate Anesthesia Transfer of Care Note  Patient: Caitlin Barrett  Procedure(s) Performed: NASAL SEPTOPLASTY WITH SUBMUCOSAL RESECTION OF TURBINATE (Bilateral: Nose)  Patient Location: PACU  Anesthesia Type: General ETT  Level of Consciousness: awake, alert  and patient cooperative  Airway and Oxygen Therapy: Patient Spontanous Breathing and Patient connected to supplemental oxygen  Post-op Assessment: Post-op Vital signs reviewed, Patient's Cardiovascular Status Stable, Respiratory Function Stable, Patent Airway and No signs of Nausea or vomiting  Post-op Vital Signs: Reviewed and stable  Complications: No notable events documented.

## 2021-03-06 ENCOUNTER — Encounter: Payer: Self-pay | Admitting: Unknown Physician Specialty

## 2021-03-23 ENCOUNTER — Other Ambulatory Visit (INDEPENDENT_AMBULATORY_CARE_PROVIDER_SITE_OTHER): Payer: Self-pay | Admitting: Adult Health

## 2021-03-23 DIAGNOSIS — E559 Vitamin D deficiency, unspecified: Secondary | ICD-10-CM

## 2021-03-25 ENCOUNTER — Other Ambulatory Visit (INDEPENDENT_AMBULATORY_CARE_PROVIDER_SITE_OTHER): Payer: Self-pay | Admitting: Adult Health

## 2021-03-25 DIAGNOSIS — E8881 Metabolic syndrome: Secondary | ICD-10-CM

## 2021-03-27 ENCOUNTER — Encounter (INDEPENDENT_AMBULATORY_CARE_PROVIDER_SITE_OTHER): Payer: Self-pay | Admitting: Adult Health

## 2021-03-28 ENCOUNTER — Encounter (INDEPENDENT_AMBULATORY_CARE_PROVIDER_SITE_OTHER): Payer: Self-pay | Admitting: Adult Health

## 2021-03-28 ENCOUNTER — Other Ambulatory Visit: Payer: Self-pay | Admitting: Family Medicine

## 2021-03-28 DIAGNOSIS — M542 Cervicalgia: Secondary | ICD-10-CM

## 2021-03-28 DIAGNOSIS — M545 Low back pain, unspecified: Secondary | ICD-10-CM

## 2021-03-28 DIAGNOSIS — R079 Chest pain, unspecified: Secondary | ICD-10-CM

## 2021-03-28 DIAGNOSIS — M25511 Pain in right shoulder: Secondary | ICD-10-CM

## 2021-03-28 NOTE — Telephone Encounter (Signed)
Please advise 

## 2021-03-29 ENCOUNTER — Encounter: Payer: Self-pay | Admitting: Podiatry

## 2021-03-29 ENCOUNTER — Ambulatory Visit: Payer: No Typology Code available for payment source | Admitting: Podiatry

## 2021-03-29 ENCOUNTER — Other Ambulatory Visit: Payer: Self-pay

## 2021-03-29 DIAGNOSIS — M722 Plantar fascial fibromatosis: Secondary | ICD-10-CM

## 2021-03-29 NOTE — Progress Notes (Signed)
She presents today for follow-up of Planter fasciitis bilaterally she states that they are not hurting nearly as bad as they were and she takes the meloxicam as she feels needed.  She also wears her braces regularly.  She continues to wear her [facility].  Objective: Vitals stable she is alert and oriented x3 pulses are palpable.  She has no reproducible tenderness on palpation medial calcaneal tubercles bilateral.  Assessment: Well-healing Planter fasciitis at 75% subjective.  Plan: Continue to take the medication on a daily basis until she is 100% healed.  She is to then continue all other conservative therapies including the medication for 1 month after being healed 100%.  Follow-up with me in 6 weeks if necessary otherwise cancel the appointment.

## 2021-03-29 NOTE — Telephone Encounter (Signed)
Requested medication (s) are due for refill today: expired medication   Requested medication (s) are on the active medication list: yes  Last refill:  ventolin- proair- 02/10/20 #18 each 2 refills , flexeril, 01/04/21 #30 2 refills   Future visit scheduled: no   Notes to clinic:  expired medication , not delegated per protocol     Requested Prescriptions  Pending Prescriptions Disp Refills   albuterol (VENTOLIN HFA) 108 (90 Base) MCG/ACT inhaler [Pharmacy Med Name: ALBUTEROL HFA (PROAIR) INHALER] 8.5 each 2    Sig: TAKE 2 PUFFS BY MOUTH EVERY 6 HOURS AS NEEDED FOR WHEEZE OR SHORTNESS OF BREATH     Pulmonology:  Beta Agonists Failed - 03/28/2021 11:27 AM      Failed - One inhaler should last at least one month. If the patient is requesting refills earlier, contact the patient to check for uncontrolled symptoms.      Passed - Valid encounter within last 12 months    Recent Outpatient Visits           9 months ago Chronic right hip pain   [place], DO   11 months ago Acute right-sided low back pain without sciatica   [place], DO   1 year ago Atypical chest pain   [place], DO   1 year ago [place], DO   2 years ago Fatigue, unspecified type   [place], DO       Future Appointments             In 2 months Bo Merino, MD [place]   In 3 months Rubie Maid, MD [facility]   In 3 months Ag[place]-Et[place], Aaron Edelman, MD [facility]             cyclobenzaprine (FLEXERIL) 10 MG tablet [Pharmacy Med Name: CYCLOBENZAPRINE 10 MG TABLET] 30 tablet 2    Sig: TAKE 1 TABLET BY MOUTH AT BEDTIME AS NEEDED FOR MUSCLE SPASMS.     Not Delegated - Analgesics:  Muscle Relaxants Failed - 03/28/2021 11:27 AM       Failed - This refill cannot be delegated      Failed - Valid encounter within last 6 months    Recent Outpatient Visits           9 months ago Chronic right hip pain   [place], DO   11 months ago Acute right-sided low back pain without sciatica   [place], DO   1 year ago Atypical chest pain   [facility] Olin Hauser, DO   1 year ago [place], DO   2 years ago Fatigue, unspecified type   [place], DO       Future Appointments             In 2 months Bo Merino, MD [place]   In 3 months Rubie Maid, MD [facility]   In 3 months Agbor-Etang, Aaron Edelman, MD [facility], [place]

## 2021-03-29 NOTE — Telephone Encounter (Signed)
Called patient to schedule future appt. No answer, mailbox full unable to leave message .

## 2021-03-30 ENCOUNTER — Ambulatory Visit (INDEPENDENT_AMBULATORY_CARE_PROVIDER_SITE_OTHER): Payer: No Typology Code available for payment source | Admitting: Adult Health

## 2021-03-30 ENCOUNTER — Encounter (INDEPENDENT_AMBULATORY_CARE_PROVIDER_SITE_OTHER): Payer: Self-pay | Admitting: Adult Health

## 2021-03-30 VITALS — BP 116/69 | HR 71 | Temp 97.7°F | Ht 69.0 in | Wt 256.0 lb

## 2021-03-30 DIAGNOSIS — Z9889 Other specified postprocedural states: Secondary | ICD-10-CM | POA: Insufficient documentation

## 2021-03-30 DIAGNOSIS — Z9189 Other specified personal risk factors, not elsewhere classified: Secondary | ICD-10-CM

## 2021-03-30 DIAGNOSIS — E8881 Metabolic syndrome: Secondary | ICD-10-CM | POA: Diagnosis not present

## 2021-03-30 DIAGNOSIS — E559 Vitamin D deficiency, unspecified: Secondary | ICD-10-CM

## 2021-03-30 DIAGNOSIS — Z6841 Body Mass Index (BMI) 40.0 and over, adult: Secondary | ICD-10-CM

## 2021-03-30 MED ORDER — VITAMIN D (ERGOCALCIFEROL) 1.25 MG (50000 UNIT) PO CAPS: ORAL_CAPSULE | ORAL | 0 refills | Status: DC

## 2021-03-30 MED ORDER — METFORMIN HCL 500 MG PO TABS: ORAL_TABLET | ORAL | 0 refills | Status: DC

## 2021-03-30 NOTE — Progress Notes (Signed)
Chief Complaint:   OBESITY Caitlin Barrett is here to discuss her progress with her obesity treatment plan along with follow-up of her obesity related diagnoses. Caitlin Barrett is on the Category 2 Plan and states she is following her eating plan approximately 30% of the time. [name] states she is walking for 20 minutes 7 times per week.  Today's visit was #: 24 Starting weight: 277 lbs Starting date: 08/13/2019 Today's weight: 256 lbs Today's date: 03/30/2021 Total lbs lost to date: 21 lbs Total lbs lost since last in-office visit: 0  Interim History:  On 03/03/2021, NASAL SEPTOPLASTY WITH SUBMUCOSAL RESECTION OF TURBINATE (Bilateral: Nose) with Dr. Tami Ribas. Currently experiencing- Numbness in roof of mouth, soreness at incision site. Follow-up today with ENT surgeon - Dr. Tami Ribas She recovered for 1.5 weeks, then resumed work. Foods that she would consume s/p procedure- protein waffles, Greek yogurt, chicken, mashed potatoes.  Subjective:   1. Insulin resistance She is on metformin 500 mg with breakfast - tolerating well.  2. Vitamin D deficiency Vitamin D level on 12/29/2020 - 35.9 - well below goal of 50. She is currently taking prescription ergocalciferol 50,000 IU each week. She denies nausea, vomiting or muscle weakness.  3. Status post nasal septoplasty On 03/03/2021, NASAL SEPTOPLASTY WITH SUBMUCOSAL RESECTION OF TURBINATE (Bilateral: Nose) with Dr. Tami Ribas. Currently experiencing- Numbness in roof of mouth, soreness at incision site. Follow-up today with ENT surgeon - Dr. Tami Ribas She recovered for 1.5 weeks, then resumed work. Foods that she would consume s/p procedure- protein waffles, Greek yogurt, chicken, mashed potatoes.  4. At risk for deficient intake of food Caitlin Barrett is at risk for deficient intake of food due to recent nasal septoplasty with submucosal resection of turbinate.  Assessment/Plan:   1. Insulin resistance Check labs at next office visit. Refill  metformin 500 mg with breakfast.  - Refill metFORMIN (GLUCOPHAGE) 500 MG tablet; 1 tab with breakfast  Dispense: 30 tablet; Refill: 0  2. Vitamin D deficiency Check labs at next office visit. Refill ergocalciferol 50,000 IU once weekly.  - Refill Vitamin D, Ergocalciferol, (DRISDOL) 1.25 MG (50000 UNIT) CAPS capsule; TAKE ONE CAPSULE BY MOUTH TWICE PER WEEK  Dispense: 8 capsule; Refill: 0  3. Status post nasal septoplasty Follow-up with ENT surgeon today.  4. At risk for deficient intake of food Caitlin Barrett was given approximately 15 minutes of deficit intake of food prevention counseling today. Caitlin Barrett is at risk for eating too few calories based on current food recall. She was encouraged to focus on meeting caloric and protein goals according to her recommended meal plan.   5. Obesity, current BMI 37.8  Caitlin Barrett is currently in the action stage of change. As such, her goal is to continue with weight loss efforts. She has agreed to the Category 2 Plan.   Check fasting labs at next office visit.  Exercise goals:  As is.  Behavioral modification strategies: increasing lean protein intake, decreasing simple carbohydrates, meal planning and cooking strategies, keeping healthy foods in the home, and planning for success.  [name] has agreed to follow-up with our clinic in 4 weeks, fasting. She was informed of the importance of frequent follow-up visits to maximize her success with intensive lifestyle modifications for her multiple health conditions.   Objective:   Blood pressure 116/69, pulse 71, temperature 97.7 F (36.5 C), height 5' 9"  (1.753 m), weight 256 lb (116.1 kg), last menstrual period 05/26/2016, SpO2 99 %. Body mass index is 37.8 kg/m.  General: Cooperative, alert, well developed,  in no acute distress. HEENT: Conjunctivae and lids unremarkable. Cardiovascular: Regular rhythm.  Lungs: Normal work of breathing. Neurologic: No focal deficits.   Lab Results  Component  Value Date   CREATININE 0.83 12/29/2020   BUN 22 12/29/2020   NA 141 12/29/2020   K 4.7 12/29/2020   CL 103 12/29/2020   CO2 23 12/29/2020   Lab Results  Component Value Date   ALT 11 12/29/2020   AST 12 12/29/2020   ALKPHOS 48 12/29/2020   BILITOT 0.3 12/29/2020   Lab Results  Component Value Date   HGBA1C 5.3 12/29/2020   HGBA1C 5.3 03/31/2020   HGBA1C 5.5 12/15/2019   HGBA1C 5.3 08/13/2019   HGBA1C 5.3 10/23/2017   Lab Results  Component Value Date   INSULIN 10.1 12/29/2020   INSULIN 8.9 06/09/2020   INSULIN 12.4 03/31/2020   INSULIN 13.5 12/15/2019   INSULIN 19.0 08/13/2019   Lab Results  Component Value Date   TSH 0.081 (L) 08/13/2019   Lab Results  Component Value Date   CHOL 246 (H) 06/09/2020   HDL 63 06/09/2020   LDLCALC 168 (H) 06/09/2020   TRIG 89 06/09/2020   CHOLHDL 3.9 06/09/2020   Lab Results  Component Value Date   VD25OH 35.9 12/29/2020   VD25OH 47.8 07/28/2020   VD25OH 49.2 06/09/2020   Lab Results  Component Value Date   WBC 9.8 03/31/2020   HGB 14.2 03/31/2020   HCT 40.9 03/31/2020   MCV 88 03/31/2020   PLT 394 03/31/2020   Lab Results  Component Value Date   IRON 64 08/13/2019   TIBC 333 08/13/2019   FERRITIN 30 08/13/2019   Attestation Statements:   Reviewed by clinician on day of visit: allergies, medications, problem list, medical history, surgical history, family history, social history, and previous encounter notes.  I, Water quality scientist, CMA, am acting as Location manager for Mina Marble, NP.  I have reviewed the above documentation for accuracy and completeness, and I agree with the above. -  Bradly Sangiovanni d. Simon Llamas, NP-C

## 2021-04-07 ENCOUNTER — Ambulatory Visit (INDEPENDENT_AMBULATORY_CARE_PROVIDER_SITE_OTHER): Payer: No Typology Code available for payment source

## 2021-04-07 ENCOUNTER — Ambulatory Visit
Admission: RE | Admit: 2021-04-07 | Discharge: 2021-04-07 | Disposition: A | Discharge status: Home / Self Care | Payer: No Typology Code available for payment source | Source: Ambulatory Visit | Attending: Medical Oncology | Admitting: Medical Oncology

## 2021-04-07 ENCOUNTER — Other Ambulatory Visit: Payer: Self-pay

## 2021-04-07 VITALS — BP 154/93 | HR 82 | Temp 98.8°F | Resp 15 | Ht 69.0 in | Wt 260.0 lb

## 2021-04-07 DIAGNOSIS — R051 Acute cough: Secondary | ICD-10-CM

## 2021-04-07 DIAGNOSIS — R079 Chest pain, unspecified: Secondary | ICD-10-CM

## 2021-04-07 DIAGNOSIS — R059 Cough, unspecified: Secondary | ICD-10-CM | POA: Diagnosis not present

## 2021-04-07 MED ORDER — BENZONATATE 100 MG PO CAPS
100.0000 mg | ORAL_CAPSULE | Freq: Three times a day (TID) | ORAL | 0 refills | Status: DC
Start: 2021-04-07 — End: 2021-07-21

## 2021-04-07 MED ORDER — ALBUTEROL SULFATE HFA 108 (90 BASE) MCG/ACT IN AERS: INHALATION_SPRAY | RESPIRATORY_TRACT | 0 refills | Status: DC

## 2021-04-07 NOTE — ED Triage Notes (Signed)
Patient c/o cough, nasal congestion, SOB, and chest tightness that started 3 days ago.  Patient states that she took a home covid test today and was negative.  Patient denies fevers.

## 2021-04-07 NOTE — ED Provider Notes (Signed)
[facility]    CSN: [id number] Arrival date & time: 04/07/21  1851      History   Chief Complaint Chief Complaint  Patient presents with   Cough    HPI Caitlin Barrett is a 51 y.o. female.   HPI  Cold Symptoms: Patient reports that they have had symptoms of dry cough, nasal congestion, mild SOB and chest tightness for the past 3 days but has had some symptoms such as mild cough off and on since Christmas. Symptoms are stable. They deny chest pain, fever or vomiting. They have tried OTC cough and cold medication along with ibuprofen for symptoms with mild improvement. No known sick contacts. Negative home COVID-19 test. Of note she had a surgical procedure in early December.    Past Medical History:  Diagnosis Date   Allergy    seasonal.  Takes allergy shots at [facility]   Anemia    Anxiety    Arthritis    Asthma    exercise induced--no inhalers   Back pain    Cancer ([place]) 2010   Basal cell skin cancer Lt eye, skin graft above Lt eye   Constipation    Costochondritis    Depression    Dysmenorrhea    Eczema    Edema, lower extremity    GERD (gastroesophageal reflux disease)    Headache    Hypothyroid    Nodules and Fine needle aspiration   IBS (irritable bowel syndrome)    Lab test positive for detection of COVID-19 virus    November 2020   Myalgia    Osteoarthritis    Painful menstrual periods    Palpitations    Plantar fasciitis    Shortness of breath    Sprain of carpal (joint) of wrist    Vitamin B 12 deficiency    Vitamin D deficiency     Patient Active Problem List   Diagnosis Date Noted   Status post nasal septoplasty 03/30/2021   Colon cancer screening    Polyp of colon    Depressed mood 12/01/2020   Nausea without vomiting 11/07/2020   Upper respiratory tract infection 11/07/2020   Thyroid disease 06/09/2020   Cervical strain 04/25/2020   Fatigue 04/04/2020   GAD (generalized anxiety disorder) 02/08/2020   Pericardial pain  01/28/2020   Muscle soreness 01/28/2020   Mixed hyperlipidemia 11/25/2019   Elevated BP without diagnosis of hypertension 11/25/2019   Polyphagia 10/08/2019   Vitamin D deficiency 09/17/2019   Insulin resistance 09/17/2019   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   Class 3 severe obesity with serious comorbidity and body mass index (BMI) of 40.0 to 44.9 in adult [facility]) 09/17/2019   Exercise-induced asthma 04/22/2018   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   Monilial vaginitis 02/26/2017   Decreased libido 02/26/2017   Major depressive disorder, recurrent, in full remission ([place]) 02/26/2017   Dyspareunia, female 02/26/2017   S/P TAH (total abdominal hysterectomy) 07/02/2016   Nevus of abdominal wall 06/11/2016   Morton's neuralgia 07/21/2015   Nocturia 07/21/2015   Stress incontinence 07/21/2015   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   Allergic rhinitis 06/21/2015   Obesity (BMI 30-39.9) 06/21/2015    Past Surgical History:  Procedure Laterality Date   ABDOMINAL HYSTERECTOMY Bilateral 07/02/2016   Procedure: HYSTERECTOMY ABDOMINAL WITH BILATERAL SALPINGECTOMY-MIDLINE INCISION;  Surgeon: Brayton Mars, MD;  Location: [facility];  Service: Gynecology;  Laterality: Bilateral;   CHOLECYSTECTOMY  2009   COLONOSCOPY WITH PROPOFOL N/A 01/26/2021   Procedure: COLONOSCOPY WITH PROPOFOL;  Surgeon: Lin Landsman, MD;  Location: [facility];  Service: Gastroenterology;  Laterality: N/A;   fibromalga     HERNIA REPAIR  1751   umbilical   NASAL SEPTOPLASTY W/ TURBINOPLASTY Bilateral 03/03/2021   Procedure: NASAL SEPTOPLASTY WITH SUBMUCOSAL RESECTION OF TURBINATE;  Surgeon: Beverly Gust, MD;  Location: [place];  Service: ENT;  Laterality: Bilateral;   SKIN GRAFT  2010   TONSILLECTOMY      OB History     Gravida  0   Para  0   Term  0   Preterm  0   AB  0   Living  0      SAB  0   IAB  0   Ectopic  0   Multiple  0   Live Births  0             Home Medications    Prior to Admission medications   Medication Sig Start Date End Date Taking? Authorizing Provider  albuterol (VENTOLIN HFA) 108 (90 Base) MCG/ACT inhaler TAKE 2 PUFFS BY MOUTH EVERY 6 HOURS AS NEEDED FOR WHEEZE OR SHORTNESS OF BREATH 03/29/21  Yes Karamalegos, Devonne Doughty, DO  clobetasol cream (TEMOVATE) 0.25 % Apply 1 application topically 2 (two) times daily. For up to 1-2 weeks then as needed. Do not use on sensitive skin. 04/24/19   Karamalegos, Devonne Doughty, DO  COLLAGEN-BORON-HYALURONIC ACID PO Take by mouth.    [provider]  cyclobenzaprine (FLEXERIL) 10 MG tablet TAKE 1 TABLET BY MOUTH AT BEDTIME AS NEEDED FOR MUSCLE SPASMS. 03/29/21   Karamalegos, Devonne Doughty, DO  diclofenac (VOLTAREN) 75 MG EC tablet Take 1 tablet (75 mg total) by mouth 2 (two) times daily as needed for moderate pain. 11/16/20   Coral Spikes, DO  EPINEPHrine 0.3 mg/0.3 mL IJ SOAJ injection 0.3 mg as needed.  09/25/08   [provider]  estradiol (ESTRACE) 1 MG tablet TAKE 1 TABLET BY MOUTH EVERY DAY 09/12/20   Rubie Maid, MD  HYDROcodone-Acetaminophen 5-300 MG TABS Take 1-2 tablets by mouth every 4 (four) hours as needed. 03/03/21   Beverly Gust, MD  loratadine (CLARITIN) 10 MG tablet Take 10 mg by mouth daily as needed. 10/13/20   [provider]  Loratadine 10 MG CAPS Take by mouth.    [provider]  meloxicam (MOBIC) 15 MG tablet Take 1 tablet (15 mg total) by mouth daily. 01/16/21   Hyatt, Max T, DPM  metFORMIN (GLUCOPHAGE) 500 MG tablet 1 tab with breakfast 03/30/21   Danford, Valetta Fuller D, NP  methocarbamol (ROBAXIN) 500 MG tablet TAKE 1-2 TABLETS (500-1,000 MG TOTAL) BY MOUTH DAILY AS NEEDED FOR MUSCLE SPASMS. 01/04/21   Karamalegos, Devonne Doughty, DO  Misc Natural Products (OSTEO BI-FLEX ADV TRIPLE ST PO) Take by mouth.    [provider]  Multiple Vitamins-Minerals (MULTIVITAMIN WOMEN PO) Take by mouth.    [provider]  omeprazole  (PRILOSEC) 40 MG capsule Take 40 mg by mouth as needed.    [provider]  ondansetron (ZOFRAN) 4 MG tablet Take 1 tablet (4 mg total) by mouth daily as needed for nausea or vomiting. 03/03/21 03/03/22  Beverly Gust, MD  ondansetron (ZOFRAN-ODT) 8 MG disintegrating tablet Take 1 tablet (8 mg total) by mouth every 8 (eight) hours as needed for nausea or vomiting. 11/03/20   Danford, Valetta Fuller D, NP  oxymetazoline (AFRIN) 0.05 % nasal spray Place 1 spray into both nostrils as needed.  [provider]  Probiotic Product (TRUBIOTICS) CAPS Take 1 capsule by mouth daily.    [provider]  progesterone (PROMETRIUM) 200 MG capsule TAKE 1 CAPSULE BY MOUTH EVERY DAY 08/02/20   Rubie Maid, MD  sulfamethoxazole-trimethoprim (BACTRIM DS) 800-160 MG tablet Take 1 tablet by mouth 2 (two) times daily. 03/03/21   Beverly Gust, MD  triamcinolone cream (KENALOG) 0.1 % APPLY THIN FILM TWICE DAILY TO ECZEMA UNTIL CLEAR 12/23/18   [provider]  TURMERIC PO Take by mouth daily.    [provider]  Vitamin D, Ergocalciferol, (DRISDOL) 1.25 MG (50000 UNIT) CAPS capsule TAKE ONE CAPSULE BY MOUTH TWICE PER WEEK 03/30/21   Danford, Valetta Fuller D, NP    Family History Family History  Problem Relation Age of Onset   Diabetes Mother    Hyperlipidemia Mother    Hypertension Mother    Sleep apnea Mother    Obesity Mother    Cancer Father        lung CA   Heart disease Maternal Grandmother    Stroke Maternal Grandmother    Hypertension Maternal Grandmother    Diabetes Maternal Grandmother    Heart disease Maternal Grandfather    Diabetes Maternal Grandfather    Heart attack Maternal Grandfather     Social History Social History   Tobacco Use   Smoking status: Never   Smokeless tobacco: Never  Vaping Use   Vaping Use: Never used  Substance Use Topics   Alcohol use: Yes    Comment: rare   Drug use: No     Allergies   Contrast media [iodinated contrast media]  and Codeine   Review of Systems Review of Systems  As stated above in HPI Physical Exam Triage Vital Signs ED Triage Vitals  Enc Vitals Group     BP 04/07/21 1900 (!) 154/93     Pulse Rate 04/07/21 1900 82     Resp 04/07/21 1900 15     Temp 04/07/21 1900 98.8 F (37.1 C)     Temp Source 04/07/21 1900 Oral     SpO2 04/07/21 1900 96 %     Weight 04/07/21 1858 260 lb (117.9 kg)     Height 04/07/21 1858 5' 9"  (1.753 m)     Head Circumference --      Peak Flow --      Pain Score 04/07/21 1858 4     Pain Loc --      Pain Edu? --      Excl. in [place]? --    No data found.  Updated Vital Signs BP (!) 154/93 (BP Location: Right Arm)    Pulse 82    Temp 98.8 F (37.1 C) (Oral)    Resp 15    Ht 5' 9"  (1.753 m)    Wt 260 lb (117.9 kg)    LMP 05/26/2016 (Exact Date)    SpO2 96%    BMI 38.40 kg/m   Physical Exam Vitals and nursing note reviewed.  Constitutional:      General: She is not in acute distress.    Appearance: Normal appearance. She is not ill-appearing, toxic-appearing or diaphoretic.  HENT:     Head: Normocephalic and atraumatic.     Right Ear: Tympanic membrane normal.     Left Ear: Tympanic membrane normal.     Nose: Congestion and rhinorrhea present.     Mouth/Throat:     Mouth: Mucous membranes are moist.     Pharynx: Oropharynx is clear. No  oropharyngeal exudate or posterior oropharyngeal erythema.  Eyes:     General:        Right eye: No discharge.        Left eye: No discharge.     Extraocular Movements: Extraocular movements intact.     Conjunctiva/sclera: Conjunctivae normal.     Pupils: Pupils are equal, round, and reactive to light.  Cardiovascular:     Rate and Rhythm: Normal rate and regular rhythm.     Heart sounds: Normal heart sounds.  Pulmonary:     Effort: Pulmonary effort is normal. No respiratory distress.     Breath sounds: Normal breath sounds. No stridor. No wheezing or rhonchi.  Abdominal:     Palpations: Abdomen is soft.   Musculoskeletal:     Cervical back: Normal range of motion and neck supple.  Lymphadenopathy:     Cervical: No cervical adenopathy.  Skin:    General: Skin is warm.     Coloration: Skin is not jaundiced.     Findings: No erythema or rash.  Neurological:     Mental Status: She is alert and oriented to person, place, and time.     UC Treatments / Results  Labs (all labs ordered are listed, but only abnormal results are displayed) Labs Reviewed - No data to display  EKG   Radiology DG Chest 2 View  Result Date: 04/07/2021 CLINICAL DATA:  Cough EXAM: CHEST - 2 VIEW COMPARISON:  03/04/2019 FINDINGS: The heart size and mediastinal contours are within normal limits. Both lungs are clear. The visualized skeletal structures are unremarkable. IMPRESSION: No active cardiopulmonary disease. Electronically Signed   By: Fidela Salisbury M.D.   On: 04/07/2021 19:31    Procedures Procedures (including critical care time)  Medications Ordered in UC Medications - No data to display  Initial Impression / Assessment and Plan / UC Course  I have reviewed the triage vital signs and the nursing notes.  Pertinent labs & imaging results that were available during my care of the patient were reviewed by me and considered in my medical decision making (see chart for details).     New. Appears viral especially with normal chest x ray. Rest, hydration with water, tessalon and albuterol. Discussed red flag sigs and symptoms. Follow up as needed.  Final Clinical Impressions(s) / UC Diagnoses   Final diagnoses:  Acute cough   Discharge Instructions   None    ED Prescriptions   None    PDMP not reviewed this encounter.   Hughie Closs, [place] 04/07/21 1941

## 2021-04-25 ENCOUNTER — Other Ambulatory Visit (INDEPENDENT_AMBULATORY_CARE_PROVIDER_SITE_OTHER): Payer: Self-pay | Admitting: Adult Health

## 2021-04-25 DIAGNOSIS — E559 Vitamin D deficiency, unspecified: Secondary | ICD-10-CM

## 2021-04-27 ENCOUNTER — Other Ambulatory Visit (INDEPENDENT_AMBULATORY_CARE_PROVIDER_SITE_OTHER): Payer: Self-pay | Admitting: Adult Health

## 2021-04-27 DIAGNOSIS — E8881 Metabolic syndrome: Secondary | ICD-10-CM

## 2021-05-03 ENCOUNTER — Encounter (INDEPENDENT_AMBULATORY_CARE_PROVIDER_SITE_OTHER): Payer: Self-pay | Admitting: Adult Health

## 2021-05-03 ENCOUNTER — Ambulatory Visit (INDEPENDENT_AMBULATORY_CARE_PROVIDER_SITE_OTHER): Payer: No Typology Code available for payment source | Admitting: Adult Health

## 2021-05-03 ENCOUNTER — Other Ambulatory Visit: Payer: Self-pay

## 2021-05-03 VITALS — BP 128/79 | HR 76 | Temp 97.5°F | Ht 69.0 in | Wt 256.0 lb

## 2021-05-03 DIAGNOSIS — Z9189 Other specified personal risk factors, not elsewhere classified: Secondary | ICD-10-CM

## 2021-05-03 DIAGNOSIS — E782 Mixed hyperlipidemia: Secondary | ICD-10-CM | POA: Diagnosis not present

## 2021-05-03 DIAGNOSIS — Z6837 Body mass index (BMI) 37.0-37.9, adult: Secondary | ICD-10-CM

## 2021-05-03 DIAGNOSIS — E8881 Metabolic syndrome: Secondary | ICD-10-CM

## 2021-05-03 DIAGNOSIS — Z6841 Body Mass Index (BMI) 40.0 and over, adult: Secondary | ICD-10-CM

## 2021-05-03 DIAGNOSIS — E669 Obesity, unspecified: Secondary | ICD-10-CM

## 2021-05-03 DIAGNOSIS — E559 Vitamin D deficiency, unspecified: Secondary | ICD-10-CM

## 2021-05-03 MED ORDER — METFORMIN HCL 500 MG PO TABS: ORAL_TABLET | ORAL | 0 refills | Status: DC

## 2021-05-03 NOTE — Progress Notes (Signed)
Chief Complaint:   OBESITY Caitlin Barrett is here to discuss her progress with her obesity treatment plan along with follow-up of her obesity related diagnoses. Caitlin Barrett is on the Category 2 Plan and states she is following her eating plan approximately 85% of the time. Caitlin Barrett states she is walking for 30 minutes 4 times per week.  Today's visit was #: 25 Starting weight: 277 lbs Starting date: 08/13/2019 Today's weight: 256 lbs Today's date: 05/03/2021 Total lbs lost to date: 21 lbs Total lbs lost since last in-office visit: 0  Interim History:  Caitlin Barrett has been dog walking plus walking 30 minutes 4 times per week - 1-3 miles per day with these combined activities. Attempted to attend "strength classes" at [facility]- had to cxl due to work conflict.  Subjective:   1. Insulin resistance She is on metformin 500 mg with breakfast - tolerating well.  2. Vitamin D deficiency She is on ergocalciferol twice weekly due to subtherapeutic Vit D level on once weekly ergocalciferol.  3. Mixed hyperlipidemia She has never been on a statin. Family hx of HLD-mother/sister - both on statin therapy. She denies cardiac symptoms at present. She denies tobacco/vape use.  4. At risk for heart disease Caitlin Barrett is at higher than average risk for cardiovascular disease due to hyperlipidemia, IR, and obesity.  Assessment/Plan:   1. Insulin resistance Refill metformin 500 mg with breakfast. Check labs.  - Refill metFORMIN (GLUCOPHAGE) 500 MG tablet; 1 tab with breakfast  Dispense: 30 tablet; Refill: 0 - Comprehensive metabolic panel - Hemoglobin A1c - Insulin, random  2. Vitamin D deficiency Check labs - MyChart patient after labs.  - VITAMIN D 25 Hydroxy (Vit-D Deficiency, Fractures)  3. Mixed hyperlipidemia Check labs.  - Lipid panel  4. At risk for heart disease Caitlin Barrett was given approximately 15 minutes of coronary artery disease prevention counseling today. She is  51 y.o. female and has risk factors for heart disease including obesity. We discussed intensive lifestyle modifications today with an emphasis on specific weight loss instructions and strategies.  Repetitive spaced learning was employed today to elicit superior memory formation and behavioral change.   5. Obesity, current BMI 37.8  Caitlin Barrett is currently in the action stage of change. As such, her goal is to continue with weight loss efforts. She has agreed to the Category 2 Plan.   Exercise goals:  As is.  Behavioral modification strategies: increasing lean protein intake, decreasing simple carbohydrates, increasing water intake, meal planning and cooking strategies, keeping healthy foods in the home, and planning for success.  Caitlin Barrett has agreed to follow-up with our clinic in 4 weeks. She was informed of the importance of frequent follow-up visits to maximize her success with intensive lifestyle modifications for her multiple health conditions.   Caitlin Barrett was informed we would discuss her lab results at her next visit unless there is a critical issue that needs to be addressed sooner. Caitlin Barrett agreed to keep her next visit at the agreed upon time to discuss these results.  Objective:   Blood pressure 128/79, pulse 76, temperature (!) 97.5 F (36.4 C), height 5' 9"  (1.753 m), weight 256 lb (116.1 kg), last menstrual period 05/26/2016, SpO2 98 %. Body mass index is 37.8 kg/m.  General: Cooperative, alert, well developed, in no acute distress. HEENT: Conjunctivae and lids unremarkable. Cardiovascular: Regular rhythm.  Lungs: Normal work of breathing. Neurologic: No focal deficits.   Lab Results  Component Value Date   CREATININE 0.83 12/29/2020  BUN 22 12/29/2020   NA 141 12/29/2020   K 4.7 12/29/2020   CL 103 12/29/2020   CO2 23 12/29/2020   Lab Results  Component Value Date   ALT 11 12/29/2020   AST 12 12/29/2020   ALKPHOS 48 12/29/2020   BILITOT 0.3 12/29/2020   Lab  Results  Component Value Date   HGBA1C 5.3 12/29/2020   HGBA1C 5.3 03/31/2020   HGBA1C 5.5 12/15/2019   HGBA1C 5.3 08/13/2019   HGBA1C 5.3 10/23/2017   Lab Results  Component Value Date   INSULIN 10.1 12/29/2020   INSULIN 8.9 06/09/2020   INSULIN 12.4 03/31/2020   INSULIN 13.5 12/15/2019   INSULIN 19.0 08/13/2019   Lab Results  Component Value Date   TSH 0.081 (L) 08/13/2019   Lab Results  Component Value Date   CHOL 246 (H) 06/09/2020   HDL 63 06/09/2020   LDLCALC 168 (H) 06/09/2020   TRIG 89 06/09/2020   CHOLHDL 3.9 06/09/2020   Lab Results  Component Value Date   VD25OH 35.9 12/29/2020   VD25OH 47.8 07/28/2020   VD25OH 49.2 06/09/2020   Lab Results  Component Value Date   WBC 9.8 03/31/2020   HGB 14.2 03/31/2020   HCT 40.9 03/31/2020   MCV 88 03/31/2020   PLT 394 03/31/2020   Lab Results  Component Value Date   IRON 64 08/13/2019   TIBC 333 08/13/2019   FERRITIN 30 08/13/2019   Attestation Statements:   Reviewed by clinician on day of visit: allergies, medications, problem list, medical history, surgical history, family history, social history, and previous encounter notes.  I, Water quality scientist, CMA, am acting as Location manager for Mina Marble, NP.  I have reviewed the above documentation for accuracy and completeness, and I agree with the above. -  Damarrion Mimbs d. Erma Raiche, NP-C

## 2021-05-04 ENCOUNTER — Encounter (INDEPENDENT_AMBULATORY_CARE_PROVIDER_SITE_OTHER): Payer: Self-pay | Admitting: Adult Health

## 2021-05-04 ENCOUNTER — Other Ambulatory Visit (INDEPENDENT_AMBULATORY_CARE_PROVIDER_SITE_OTHER): Payer: Self-pay | Admitting: Adult Health

## 2021-05-04 DIAGNOSIS — E559 Vitamin D deficiency, unspecified: Secondary | ICD-10-CM

## 2021-05-04 LAB — LIPID PANEL
Chol/HDL Ratio: 4.8 ratio — ABNORMAL HIGH (ref 0.0–4.4)
Cholesterol, Total: 200 mg/dL — ABNORMAL HIGH (ref 100–199)
HDL: 42 mg/dL (ref >39)
LDL Chol Calc (NIH): 137 mg/dL — ABNORMAL HIGH (ref 0–99)
Triglycerides: 117 mg/dL (ref 0–149)
VLDL Cholesterol Cal: 21 mg/dL (ref 5–40)

## 2021-05-04 LAB — COMPREHENSIVE METABOLIC PANEL
ALT: 8 IU/L (ref 0–32)
AST: 12 IU/L (ref 0–40)
Albumin/Globulin Ratio: 2 (ref 1.2–2.2)
Albumin: 4.5 g/dL (ref 3.8–4.8)
Alkaline Phosphatase: 54 IU/L (ref 44–121)
BUN/Creatinine Ratio: 24 — ABNORMAL HIGH (ref 9–23)
BUN: 19 mg/dL (ref 6–24)
Bilirubin Total: <0.2 mg/dL (ref 0.0–1.2)
CO2: 20 mmol/L (ref 20–29)
Calcium: 9.6 mg/dL (ref 8.7–10.2)
Chloride: 100 mmol/L (ref 96–106)
Creatinine, Ser: 0.78 mg/dL (ref 0.57–1.00)
Globulin, Total: 2.2 g/dL (ref 1.5–4.5)
Glucose: 92 mg/dL (ref 70–99)
Potassium: 4.3 mmol/L (ref 3.5–5.2)
Sodium: 140 mmol/L (ref 134–144)
Total Protein: 6.7 g/dL (ref 6.0–8.5)
eGFR: 94 mL/min/{1.73_m2} (ref >59)

## 2021-05-04 LAB — HEMOGLOBIN A1C
Est. average glucose Bld gHb Est-mCnc: 105 mg/dL
Hgb A1c MFr Bld: 5.3 % (ref 4.8–5.6)

## 2021-05-04 LAB — VITAMIN D 25 HYDROXY (VIT D DEFICIENCY, FRACTURES): Vit D, 25-Hydroxy: 50.5 ng/mL (ref 30.0–100.0)

## 2021-05-04 LAB — INSULIN, RANDOM: INSULIN: 17.5 u[IU]/mL (ref 2.6–24.9)

## 2021-05-04 MED ORDER — VITAMIN D (ERGOCALCIFEROL) 1.25 MG (50000 UNIT) PO CAPS: ORAL_CAPSULE | ORAL | 0 refills | Status: DC

## 2021-05-10 ENCOUNTER — Ambulatory Visit: Payer: No Typology Code available for payment source | Admitting: Podiatry

## 2021-05-10 ENCOUNTER — Encounter: Payer: Self-pay | Admitting: Podiatry

## 2021-05-10 ENCOUNTER — Other Ambulatory Visit: Payer: Self-pay

## 2021-05-10 DIAGNOSIS — D2371 Other benign neoplasm of skin of right lower limb, including hip: Secondary | ICD-10-CM | POA: Diagnosis not present

## 2021-05-10 DIAGNOSIS — M722 Plantar fascial fibromatosis: Secondary | ICD-10-CM

## 2021-05-10 NOTE — Progress Notes (Signed)
She presents today for follow-up of her plantar fasciitis she states that is doing okay mostly just depends on her activity level.  Objective: Vital signs are stable she alert and x3 no reproducible pain to palpation me to continue to go so she does have reactive benign skin lesion secondary to hallux valgus deformity and hallux interphalangeal deformities of the bilateral foot right greater than left.  Assessment: Resolving plantar fasciitis hallux valgus with benign hyperkeratotic skin lesions.  Plan: Debrided benign skin lesions bilateral.

## 2021-05-31 ENCOUNTER — Other Ambulatory Visit: Payer: Self-pay

## 2021-05-31 ENCOUNTER — Ambulatory Visit (INDEPENDENT_AMBULATORY_CARE_PROVIDER_SITE_OTHER): Payer: No Typology Code available for payment source | Admitting: Adult Health

## 2021-05-31 ENCOUNTER — Encounter (INDEPENDENT_AMBULATORY_CARE_PROVIDER_SITE_OTHER): Payer: Self-pay | Admitting: Adult Health

## 2021-05-31 VITALS — BP 137/82 | HR 71 | Temp 97.6°F | Ht 69.0 in | Wt 259.0 lb

## 2021-05-31 DIAGNOSIS — E559 Vitamin D deficiency, unspecified: Secondary | ICD-10-CM

## 2021-05-31 DIAGNOSIS — E8881 Metabolic syndrome: Secondary | ICD-10-CM | POA: Diagnosis not present

## 2021-05-31 DIAGNOSIS — E782 Mixed hyperlipidemia: Secondary | ICD-10-CM

## 2021-05-31 DIAGNOSIS — Z6838 Body mass index (BMI) 38.0-38.9, adult: Secondary | ICD-10-CM

## 2021-05-31 DIAGNOSIS — Z9189 Other specified personal risk factors, not elsewhere classified: Secondary | ICD-10-CM

## 2021-05-31 DIAGNOSIS — E669 Obesity, unspecified: Secondary | ICD-10-CM

## 2021-05-31 MED ORDER — VITAMIN D (ERGOCALCIFEROL) 1.25 MG (50000 UNIT) PO CAPS: ORAL_CAPSULE | ORAL | 0 refills | Status: DC

## 2021-05-31 MED ORDER — METFORMIN HCL 500 MG PO TABS: ORAL_TABLET | ORAL | 0 refills | Status: DC

## 2021-05-31 NOTE — Progress Notes (Signed)
? ? ? ?Chief Complaint:  ? ?OBESITY ?Caitlin Barrett is here to discuss her progress with her obesity treatment plan along with follow-up of her obesity related diagnoses. Caitlin Barrett is on the Category 2 Plan and states she is following her eating plan approximately 90% of the time. Caitlin Barrett states she is walking for 30 minutes 2 times per week. ? ?Today's visit was #: 26 ?Starting weight: 277 lbs ?Starting date: 08/13/2019 ?Today's weight: 259 lbs ?Today's date: 05/31/2021 ?Total lbs lost to date: 18 lbs ?Total lbs lost since last in-office visit: 0 ? 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 ?She endorses recent increased polyphagia. ? ?Subjective:  ? ?1. Mixed hyperlipidemia ?Discussed labs with patient today.  ?Since last lipid panel in 11/2019, total 187, HDL 33, LDL 128. ?She decreased red meat and egg intake.   ?On 05/03/2021, lipid panel - total 200, LDL 137, HDL 42. ?ASCVD risk stratification 1.7%. ? ?2. Insulin resistance ?Worsening.  Discussed labs with patient today.  ?Currently on metformin 500 QAM - tolerating well. ?Experiencing increased polyphagia. ?She denies family hx of MTC or personal hx of pancreatitis. ? ?3. Vitamin D deficiency ?Discussed labs with patient today.  ?Vitamin D level on 05/03/2021 - 50.5 - stable. ?She is currently taking prescription ergocalciferol 50,000 IU each week. She denies nausea, vomiting or muscle weakness. ? ?4. At risk for diabetes mellitus ?Caitlin Barrett is at higher than average risk for developing diabetes due to worsening IR and obesity.  ? ?Assessment/Plan:  ? ?1. Mixed hyperlipidemia ?Continue to decrease saturated fat and increase walking. ? ?2. Insulin resistance ?Refill metformin 500 mg at breakfast. ?If polyphagia continues even after increase in protein intake- ? ?Backup 1) increase metformin, 2) Change to GLP-1 therapy. ? ?- Refill metFORMIN (GLUCOPHAGE) 500 MG tablet; 1 tab with breakfast   Dispense: 30 tablet; Refill: 0 ? ?3. Vitamin D deficiency ?Refill ergocalciferol 50,000 IU once weekly. ? ?- Refill Vitamin D, Ergocalciferol, (DRISDOL) 1.25 MG (50000 UNIT) CAPS capsule; TAKE ONE CAPSULE BY MOUTH ONCE PER WEEK  Dispense: 4 capsule; Refill: 0 ? ?4. At risk for diabetes mellitus ?Caitlin Barrett was given approximately 15 minutes of diabetic education and counseling today. We discussed intensive lifestyle modifications today with an emphasis on weight loss as well as increasing exercise and decreasing simple carbohydrates in her diet. We also reviewed medication options with an emphasis on risk versus benefits of those discussed. ? ?Repetitive spaced learning was employed today to elicit superior memory formation and behavioral change. ? ?5. Obesity, current BMI 38.3 ? ?Caitlin Barrett is currently in the action stage of change. As such, her goal is to continue with weight loss efforts. She has agreed to the Category 2 Plan.  ? ?Increase protein at meals and snacks. ? ?Exercise goals:  As is. ? ?Behavioral modification strategies: increasing lean protein intake, decreasing simple carbohydrates, meal planning and cooking strategies, keeping healthy foods in the home, and planning for success. ? ?Caitlin Barrett has agreed to follow-up with our clinic in 4 weeks. She was informed of the importance of frequent follow-up visits to maximize her success with intensive lifestyle modifications for her multiple health conditions.  ? ?Objective:  ? ?Blood pressure 137/82, pulse 71, temperature 97.6 ?F (36.4 ?C), height 5' 9"  (1.753 m), weight 259 lb (117.5 kg), last menstrual period 05/26/2016, SpO2 99 %. ?Body mass index is 38.25 kg/m?. ? ?General: Cooperative, alert, well developed, in no acute distress. ?HEENT: Conjunctivae and  lids unremarkable. ?Cardiovascular: Regular rhythm.  ?Lungs: Normal work of breathing. ?Neurologic: No focal deficits.  ? ?Lab Results  ?Component Value Date  ? CREATININE 0.78 05/03/2021  ? BUN 19  05/03/2021  ? NA 140 05/03/2021  ? K 4.3 05/03/2021  ? CL 100 05/03/2021  ? CO2 20 05/03/2021  ? ?Lab Results  ?Component Value Date  ? ALT 8 05/03/2021  ? AST 12 05/03/2021  ? ALKPHOS 54 05/03/2021  ? BILITOT <0.2 05/03/2021  ? ?Lab Results  ?Component Value Date  ? HGBA1C 5.3 05/03/2021  ? HGBA1C 5.3 12/29/2020  ? HGBA1C 5.3 03/31/2020  ? HGBA1C 5.5 12/15/2019  ? HGBA1C 5.3 08/13/2019  ? ?Lab Results  ?Component Value Date  ? INSULIN 17.5 05/03/2021  ? INSULIN 10.1 12/29/2020  ? INSULIN 8.9 06/09/2020  ? INSULIN 12.4 03/31/2020  ? INSULIN 13.5 12/15/2019  ? ?Lab Results  ?Component Value Date  ? TSH 0.081 (L) 08/13/2019  ? ?Lab Results  ?Component Value Date  ? CHOL 200 (H) 05/03/2021  ? HDL 42 05/03/2021  ? LDLCALC 137 (H) 05/03/2021  ? TRIG 117 05/03/2021  ? CHOLHDL 4.8 (H) 05/03/2021  ? ?Lab Results  ?Component Value Date  ? VD25OH 50.5 05/03/2021  ? VD25OH 35.9 12/29/2020  ? VD25OH 47.8 07/28/2020  ? ?Lab Results  ?Component Value Date  ? WBC 9.8 03/31/2020  ? HGB 14.2 03/31/2020  ? HCT 40.9 03/31/2020  ? MCV 88 03/31/2020  ? PLT 394 03/31/2020  ? ?Lab Results  ?Component Value Date  ? IRON 64 08/13/2019  ? TIBC 333 08/13/2019  ? FERRITIN 30 08/13/2019  ? ?Attestation Statements:  ? ?Reviewed by clinician on day of visit: allergies, medications, problem list, medical history, surgical history, family history, social history, and previous encounter notes. ? ?I, Water quality scientist, CMA, am acting as Location manager for Mina Marble, NP. ? ?I have reviewed the above documentation for accuracy and completeness, and I agree with the above. -  Katria Botts d. Heavyn Yearsley, NP-C ?

## 2021-06-08 NOTE — Progress Notes (Signed)
? ?Office Visit Note ? ?Patient: Caitlin Barrett             ?Date of Birth: [birth year]           ?MRN: [id number]             ?PCP: Smitty Cords, DO ?Referring: Saralyn Pilar * ?Visit Date: 06/21/2021 ?Occupation: @GUAROCC @ ? ?Subjective:  ?Arthritis (Right hip pain, bil knee pain) ? ? ?History of Present Illness: Caitlin Barrett is a 51 y.o. female with history of osteoarthritis and myofascial pain syndrome.  She states that she has been experiencing increased pain in her right hip which she points to the trochanteric region.  She has been also experiencing pain in her bilateral knee joints and bilateral legs.  She states her left knee joint has been swollen off and on.  She was seen at the [facility] sports medicine for the left knee joint pain.  She states they did x-rays of the knee joint and was told that she had some arthritis.  She was also seen by Dr. Al Corpus for ongoing pain and discomfort in her feet.  She was told that she has Planter fasciitis.  She was placed on meloxicam.  She has noticed improvement in her foot pain.  She has not noticed improvement in the knee pain.  Right shoulder joint is better.  She has off-and-on discomfort in her lower back depending on the activity. ? ?Activities of Daily Living:  ?Patient reports morning stiffness for 2 hours.   ?Patient Reports nocturnal pain.  ?Difficulty dressing/grooming: Denies ?Difficulty climbing stairs: Reports ?Difficulty getting out of chair: Reports ?Difficulty using hands for taps, buttons, cutlery, and/or writing: Denies ? ?Review of Systems  ?Constitutional:  Positive for fatigue.  ?HENT:  Positive for mouth dryness.   ?Eyes:  Positive for dryness.  ?Respiratory:  Negative for shortness of breath.   ?Cardiovascular:  Positive for swelling in legs/feet.  ?Gastrointestinal:  Positive for constipation.  ?Endocrine: Positive for heat intolerance.  ?Genitourinary:  Negative for difficulty urinating.  ?Musculoskeletal:  Positive  for joint pain, joint pain, joint swelling, morning stiffness and muscle tenderness. Negative for gait problem.  ?Skin:  Negative for color change, rash and sensitivity to sunlight.  ?Allergic/Immunologic: Positive for susceptible to infections.  ?Neurological:  Positive for weakness.  ?Hematological:  Negative for bruising/bleeding tendency.  ?Psychiatric/Behavioral:  Positive for sleep disturbance.   ? ?PMFS History:  ?Patient Active Problem List  ? Diagnosis Date Noted  ? Status post nasal septoplasty 03/30/2021  ? Colon cancer screening   ? Polyp of colon   ? Depressed mood 12/01/2020  ? Nausea without vomiting 11/07/2020  ? Upper respiratory tract infection 11/07/2020  ? Thyroid disease 06/09/2020  ? Cervical strain 04/25/2020  ? Fatigue 04/04/2020  ? GAD (generalized anxiety disorder) 02/08/2020  ? Pericardial pain 01/28/2020  ? Muscle soreness 01/28/2020  ? Mixed hyperlipidemia 11/25/2019  ? Elevated BP without diagnosis of hypertension 11/25/2019  ? Polyphagia 10/08/2019  ? Vitamin D deficiency 09/17/2019  ? Insulin resistance 09/17/2019  ? 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 Class 3 severe obesity with serious comorbidity and body mass index (BMI) of 40.0 to 44.9 in adult [facility]) 09/17/2019  ? Exercise-induced asthma 04/22/2018  ? 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 Monilial vaginitis 02/26/2017  ? Decreased libido 02/26/2017  ? Major depressive disorder, recurrent, in full remission (HCC) 02/26/2017  ? Dyspareunia, female 02/26/2017  ? S/P TAH (total abdominal hysterectomy) 07/02/2016  ? Nevus of abdominal  wall 06/11/2016  ? 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 Nocturia 07/21/2015  ? Stress incontinence 07/21/2015  ? 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 Allergic rhinitis 06/21/2015  ? Obesity (BMI 30-39.9) 06/21/2015  ?  ?Past Medical History:  ?Diagnosis Date  ? Allergy   ? seasonal.  Takes allergy shots at [facility]  ? Anemia   ? Anxiety   ? Arthritis   ? Asthma   ? exercise induced--no inhalers  ?  Back pain   ? Cancer [facility]) 2010  ? Basal cell skin cancer Lt eye, skin graft above Lt eye  ? Constipation   ? Costochondritis   ? Depression   ? Dysmenorrhea   ? Eczema   ? Edema, lower extremity   ? GERD (gastroesophageal reflux disease)   ? Headache   ? Hypothyroid   ? Nodules and Fine needle aspiration  ? IBS (irritable bowel syndrome)   ? Lab test positive for detection of COVID-19 virus   ? November 2020  ? Myalgia   ? Osteoarthritis   ? Painful menstrual periods   ? Palpitations   ? Plantar fasciitis   ? Shortness of breath   ? Sprain of carpal (joint) of wrist   ? Vitamin B 12 deficiency   ? Vitamin D deficiency   ?  ?Family History  ?Problem Relation Age of Onset  ? Diabetes Mother   ? Hyperlipidemia Mother   ? Hypertension Mother   ? Sleep apnea Mother   ? Obesity Mother   ? Cancer Father   ?     lung CA  ? Heart disease Maternal Grandmother   ? Stroke Maternal Grandmother   ? Hypertension Maternal Grandmother   ? Diabetes Maternal Grandmother   ? Heart disease Maternal Grandfather   ? Diabetes Maternal Grandfather   ? Heart attack Maternal Grandfather   ? ?Past Surgical History:  ?Procedure Laterality Date  ? ABDOMINAL HYSTERECTOMY Bilateral 07/02/2016  ? Procedure: HYSTERECTOMY ABDOMINAL WITH BILATERAL SALPINGECTOMY-MIDLINE INCISION;  Surgeon: Herold Harms, MD;  Location: [facility] ORS;  Service: Gynecology;  Laterality: Bilateral;  ? CHOLECYSTECTOMY  2009  ? COLONOSCOPY WITH PROPOFOL N/A 01/26/2021  ? Procedure: COLONOSCOPY WITH PROPOFOL;  Surgeon: Toney Reil, MD;  Location: [facility];  Service: Gastroenterology;  Laterality: N/A;  ? fibromalga    ? HERNIA REPAIR  1974  ? umbilical  ? NASAL SEPTOPLASTY W/ TURBINOPLASTY Bilateral 03/03/2021  ? Procedure: NASAL SEPTOPLASTY WITH SUBMUCOSAL RESECTION OF TURBINATE;  Surgeon: Linus Salmons, MD;  Location: [facility];  Service: ENT;  Laterality: Bilateral;  ? SKIN GRAFT  2010  ? TONSILLECTOMY    ? ?Social History  ? ?Social History  Narrative  ? Not on file  ? ?Immunization History  ?Administered Date(s) Administered  ? Influenza Inj Mdck Quad Pf 01/29/2019  ? Influenza Nasal 01/16/2018  ? Influenza-Unspecified 01/25/2020  ? Tdap 06/14/2017  ?  ? ?Objective: ?Vital Signs: BP 133/88 (BP Location: Left Arm, Patient Position: Sitting, Cuff Size: Normal)   Pulse 96   Resp 15   Ht 5\' 8"  (1.727 m)   Wt 264 lb (119.7 kg)   LMP 05/26/2016 (Exact Date)   BMI 40.14 kg/m?   ? ?Physical Exam ?Vitals and nursing note reviewed.  ?Constitutional:   ?   Appearance: She is well-developed.  ?HENT:  ?   Head: Normocephalic and atraumatic.  ?Eyes:  ?   Conjunctiva/sclera: Conjunctivae normal.  ?Cardiovascular:  ?   Rate and Rhythm: Normal rate and regular rhythm.  ?  Heart sounds: Normal heart sounds.  ?Pulmonary:  ?   Effort: Pulmonary effort is normal.  ?   Breath sounds: Normal breath sounds.  ?Abdominal:  ?   General: Bowel sounds are normal.  ?   Palpations: Abdomen is soft.  ?Musculoskeletal:  ?   Cervical back: Normal range of motion.  ?Lymphadenopathy:  ?   Cervical: No cervical adenopathy.  ?Skin: ?   General: Skin is warm and dry.  ?   Capillary Refill: Capillary refill takes less than 2 seconds.  ?Neurological:  ?   Mental Status: She is alert and oriented to person, place, and time.  ?Psychiatric:     ?   Behavior: Behavior normal.  ?  ? ?Musculoskeletal Exam: C-spine was in good range of motion.  Shoulder joints, elbow joints, wrist joints with good range of motion.  She had bilateral PIP and DIP thickening.  Hip joints with good range of motion.  She had tenderness on palpation of her right trochanteric bursa.  She had discomfort range of motion of bilateral knee joints especially her left knee joint.  No warmth swelling or effusion was noted.  There was no tenderness over ankles or MTPs.  She had generalized hyperalgesia and positive tender points. ? ?CDAI Exam: ?CDAI Score: -- ?Patient Global: --; Provider Global: -- ?Swollen: --; Tender:  -- ?Joint Exam 06/21/2021  ? ?No joint exam has been documented for this visit  ? ?There is currently no information documented on the homunculus. Go to the Rheumatology activity and complete the homunculus joint ex

## 2021-06-09 ENCOUNTER — Other Ambulatory Visit: Payer: Self-pay | Admitting: Podiatry

## 2021-06-21 ENCOUNTER — Encounter: Payer: Self-pay | Admitting: Rheumatology

## 2021-06-21 ENCOUNTER — Ambulatory Visit: Payer: No Typology Code available for payment source | Admitting: Rheumatology

## 2021-06-21 VITALS — BP 133/88 | HR 96 | Resp 15 | Ht 68.0 in | Wt 264.0 lb

## 2021-06-21 DIAGNOSIS — M1712 Unilateral primary osteoarthritis, left knee: Secondary | ICD-10-CM

## 2021-06-21 DIAGNOSIS — J45990 Exercise induced bronchospasm: Secondary | ICD-10-CM

## 2021-06-21 DIAGNOSIS — Z8719 Personal history of other diseases of the digestive system: Secondary | ICD-10-CM

## 2021-06-21 DIAGNOSIS — E038 Other specified hypothyroidism: Secondary | ICD-10-CM

## 2021-06-21 DIAGNOSIS — F418 Other specified anxiety disorders: Secondary | ICD-10-CM

## 2021-06-21 DIAGNOSIS — M7061 Trochanteric bursitis, right hip: Secondary | ICD-10-CM

## 2021-06-21 DIAGNOSIS — M2550 Pain in unspecified joint: Secondary | ICD-10-CM

## 2021-06-21 DIAGNOSIS — G8929 Other chronic pain: Secondary | ICD-10-CM

## 2021-06-21 DIAGNOSIS — M7062 Trochanteric bursitis, left hip: Secondary | ICD-10-CM

## 2021-06-21 DIAGNOSIS — M25511 Pain in right shoulder: Secondary | ICD-10-CM

## 2021-06-21 DIAGNOSIS — M7918 Myalgia, other site: Secondary | ICD-10-CM | POA: Diagnosis not present

## 2021-06-21 DIAGNOSIS — G5761 Lesion of plantar nerve, right lower limb: Secondary | ICD-10-CM

## 2021-06-21 DIAGNOSIS — M545 Low back pain, unspecified: Secondary | ICD-10-CM

## 2021-06-21 DIAGNOSIS — Z840 Family history of diseases of the skin and subcutaneous tissue: Secondary | ICD-10-CM

## 2021-06-21 DIAGNOSIS — M533 Sacrococcygeal disorders, not elsewhere classified: Secondary | ICD-10-CM

## 2021-06-21 DIAGNOSIS — C44319 Basal cell carcinoma of skin of other parts of face: Secondary | ICD-10-CM

## 2021-06-21 NOTE — Patient Instructions (Addendum)
Back Exercises ?The following exercises strengthen the muscles that help to support the trunk (torso) and back. They also help to keep the lower back flexible. Doing these exercises can help to prevent or lessen existing low back pain. ?If you have back pain or discomfort, try doing these exercises 2-3 times each day or as told by your health care provider. ?As your pain improves, do them once each day, but increase the number of times that you repeat the steps for each exercise (do more repetitions). ?To prevent the recurrence of back pain, continue to do these exercises once each day or as told by your health care provider. ?Do exercises exactly as told by your health care provider and adjust them as directed. It is normal to feel mild stretching, pulling, tightness, or discomfort as you do these exercises, but you should stop right away if you feel sudden pain or your pain gets worse. ?Exercises ?Single knee to chest ?Repeat these steps 3-5 times for each leg: ?Lie on your back on a firm bed or the floor with your legs extended. ?Bring one knee to your chest. Your other leg should stay extended and in contact with the floor. ?Hold your knee in place by grabbing your knee or thigh with both hands and hold. ?Pull on your knee until you feel a gentle stretch in your lower back or buttocks. ?Hold the stretch for 10-30 seconds. ?Slowly release and straighten your leg. ? ?Pelvic tilt ?Repeat these steps 5-10 times: ?Lie on your back on a firm bed or the floor with your legs extended. ?Bend your knees so they are pointing toward the ceiling and your feet are flat on the floor. ?Tighten your lower abdominal muscles to press your lower back against the floor. This motion will tilt your pelvis so your tailbone points up toward the ceiling instead of pointing to your feet or the floor. ?With gentle tension and even breathing, hold this position for 5-10 seconds. ? ?Cat-cow ?Repeat these steps until your lower back becomes  more flexible: ?Get into a hands-and-knees position on a firm bed or the floor. Keep your hands under your shoulders, and keep your knees under your hips. You may place padding under your knees for comfort. ?Let your head hang down toward your chest. Contract your abdominal muscles and point your tailbone toward the floor so your lower back becomes rounded like the back of a cat. ?Hold this position for 5 seconds. ?Slowly lift your head, let your abdominal muscles relax, and point your tailbone up toward the ceiling so your back forms a sagging arch like the back of a cow. ?Hold this position for 5 seconds. ? ?Press-ups ?Repeat these steps 5-10 times: ?Lie on your abdomen (face-down) on a firm bed or the floor. ?Place your palms near your head, about shoulder-width apart. ?Keeping your back as relaxed as possible and keeping your hips on the floor, slowly straighten your arms to raise the top half of your body and lift your shoulders. Do not use your back muscles to raise your upper torso. You may adjust the placement of your hands to make yourself more comfortable. ?Hold this position for 5 seconds while you keep your back relaxed. ?Slowly return to lying flat on the floor. ? ?Bridges ?Repeat these steps 10 times: ?Lie on your back on a firm bed or the floor. ?Bend your knees so they are pointing toward the ceiling and your feet are flat on the floor. Your arms should be flat  at your sides, next to your body. ?Tighten your buttocks muscles and lift your buttocks off the floor until your waist is at almost the same height as your knees. You should feel the muscles working in your buttocks and the back of your thighs. If you do not feel these muscles, slide your feet 1-2 inches (2.5-5 cm) farther away from your buttocks. ?Hold this position for 3-5 seconds. ?Slowly lower your hips to the starting position, and allow your buttocks muscles to relax completely. ?If this exercise is too easy, try doing it with your arms  crossed over your chest. ?Abdominal crunches ?Repeat these steps 5-10 times: ?Lie on your back on a firm bed or the floor with your legs extended. ?Bend your knees so they are pointing toward the ceiling and your feet are flat on the floor. ?Cross your arms over your chest. ?Tip your chin slightly toward your chest without bending your neck. ?Tighten your abdominal muscles and slowly raise your torso high enough to lift your shoulder blades a tiny bit off the floor. Avoid raising your torso higher than that because it can put too much stress on your lower back and does not help to strengthen your abdominal muscles. ?Slowly return to your starting position. ? ?Back lifts ?Repeat these steps 5-10 times: ?Lie on your abdomen (face-down) with your arms at your sides, and rest your forehead on the floor. ?Tighten the muscles in your legs and your buttocks. ?Slowly lift your chest off the floor while you keep your hips pressed to the floor. Keep the back of your head in line with the curve in your back. Your eyes should be looking at the floor. ?Hold this position for 3-5 seconds. ?Slowly return to your starting position. ? ?Contact a health care provider if: ?Your back pain or discomfort gets much worse when you do an exercise. ?Your worsening back pain or discomfort does not lessen within 2 hours after you exercise. ?If you have any of these problems, stop doing these exercises right away. Do not do them again unless your health care provider says that you can. ?Get help right away if: ?You develop sudden, severe back pain. If this happens, stop doing the exercises right away. Do not do them again unless your health care provider says that you can. ?This information is not intended to replace advice given to you by your health care provider. Make sure you discuss any questions you have with your health care provider. ?Document Revised: 09/06/2020 Document Reviewed: 05/25/2020 ?Elsevier Patient Education ? [place]. ? ? ?Iliotibial Band Syndrome Rehab ?Ask your health care provider which exercises are safe for you. Do exercises exactly as told by your health care provider and adjust them as directed. It is normal to feel mild stretching, pulling, tightness, or discomfort as you do these exercises. Stop right away if you feel sudden pain or your pain gets significantly worse. Do not begin these exercises until told by your health care provider. ?Stretching and range-of-motion exercises ?These exercises warm up your muscles and joints and improve the movement and flexibility of your hip and pelvis. ?Quadriceps stretch, prone ? ?Lie on your abdomen (prone position) on a firm surface, such as a bed or padded floor. ?Bend your left / right knee and reach back to hold your ankle or pant leg. If you cannot reach your ankle or pant leg, loop a belt around your foot and grab the belt instead. ?Gently pull your heel toward your buttocks.  Your knee should not slide out to the side. You should feel a stretch in the front of your thigh and knee (quadriceps). ?Hold this position for __________ seconds. ?Repeat __________ times. Complete this exercise __________ times a day. ?Iliotibial band stretch ?An iliotibial band is a strong band of muscle tissue that runs from the outer side of your hip to the outer side of your thigh and knee. ?Lie on your side with your left / right leg in the top position. ?Bend both of your knees and grab your left / right ankle. Stretch out your bottom arm to help you balance. ?Slowly bring your top knee back so your thigh goes behind your trunk. ?Slowly lower your top leg toward the floor until you feel a gentle stretch on the outside of your left / right hip and thigh. If you do not feel a stretch and your knee will not fall farther, place the heel of your other foot on top of your knee and pull your knee down toward the floor with your foot. ?Hold this position for __________ seconds. ?Repeat __________  times. Complete this exercise __________ times a day. ?Strengthening exercises ?These exercises build strength and endurance in your hip and pelvis. Endurance is the ability to use your muscles for a lon

## 2021-06-27 ENCOUNTER — Ambulatory Visit (INDEPENDENT_AMBULATORY_CARE_PROVIDER_SITE_OTHER): Payer: No Typology Code available for payment source | Admitting: Adult Health

## 2021-06-27 ENCOUNTER — Encounter (INDEPENDENT_AMBULATORY_CARE_PROVIDER_SITE_OTHER): Payer: Self-pay | Admitting: Adult Health

## 2021-06-27 VITALS — BP 131/82 | HR 88 | Temp 97.6°F | Ht 69.0 in | Wt 256.0 lb

## 2021-06-27 DIAGNOSIS — Z6841 Body Mass Index (BMI) 40.0 and over, adult: Secondary | ICD-10-CM | POA: Diagnosis not present

## 2021-06-27 DIAGNOSIS — Z9189 Other specified personal risk factors, not elsewhere classified: Secondary | ICD-10-CM | POA: Diagnosis not present

## 2021-06-27 DIAGNOSIS — E8881 Metabolic syndrome: Secondary | ICD-10-CM | POA: Diagnosis not present

## 2021-06-27 DIAGNOSIS — E559 Vitamin D deficiency, unspecified: Secondary | ICD-10-CM

## 2021-06-27 MED ORDER — VITAMIN D (ERGOCALCIFEROL) 1.25 MG (50000 UNIT) PO CAPS: ORAL_CAPSULE | ORAL | 0 refills | Status: DC

## 2021-06-27 MED ORDER — METFORMIN HCL 500 MG PO TABS: ORAL_TABLET | ORAL | 0 refills | Status: DC

## 2021-07-03 ENCOUNTER — Encounter: Payer: Self-pay | Admitting: Rheumatology

## 2021-07-05 NOTE — Progress Notes (Signed)
? ? ? ?Chief Complaint:  ? ?OBESITY ?Caitlin Barrett is here to discuss her progress with her obesity treatment plan along with follow-up of her obesity related diagnoses. Caitlin Barrett is on the Category 2 Plan and states she is following her eating plan approximately 85% of the time. Caitlin Barrett states she is walking/doing Zumba for 30-45 minutes 3 times per week. ? ?Today's visit was #: 8 ?Starting weight: 277 lbs ?Starting date: 08/13/2019 ?Today's weight: 256 lbs ?Today's date: 06/27/2021 ?Total lbs lost to date: 21 lbs ?Total lbs lost since last in-office visit: 3 lbs ? ?Interim History:  ?Caitlin Barrett added in Tai Chi and Zumba classes to her regular exercise regimen. ? ?Bioimpedance:   ?+0.2 muscle mass. ?-3.4 adipose mass. ?No change in water. ? ?Subjective:  ? ?1. Insulin resistance ?She reports an increase in hunger after exercise. ?Discussed risks/benefits of GLP-1 therapy - prefers to remain on biguanide. ? ?2. Vitamin D deficiency ?She is currently taking prescription ergocalciferol 50,000 IU each week. She denies nausea, vomiting or muscle weakness. ? ?3. At risk for diabetes mellitus ?Caitlin Barrett is at higher than average risk for developing diabetes due to IR and obesity.  ? ?Assessment/Plan:  ? ?1. Insulin resistance ?Refill metformin 500 mg daily with breakfast. ? ?- Refill metFORMIN (GLUCOPHAGE) 500 MG tablet; 1 tab with breakfast  Dispense: 30 tablet; Refill: 0 ? ?2. Vitamin D deficiency ?Refill ergocalciferol 50,000 IU once weekly. ? ?- Refill Vitamin D, Ergocalciferol, (DRISDOL) 1.25 MG (50000 UNIT) CAPS capsule; TAKE ONE CAPSULE BY MOUTH ONCE PER WEEK  Dispense: 4 capsule; Refill: 0 ? ?3. At risk for diabetes mellitus ?Caitlin Barrett was given approximately 15 minutes of diabetic education and counseling today. We discussed intensive lifestyle modifications today with an emphasis on weight loss as well as increasing exercise and decreasing simple carbohydrates in her diet. We also reviewed medication options with an  emphasis on risk versus benefits of those discussed. ? ?Repetitive spaced learning was employed today to elicit superior memory formation and behavioral change. ? ?4. Obesity, current BMI 37.8 ? ?Caitlin Barrett is currently in the action stage of change. As such, her goal is to continue with weight loss efforts. She has agreed to the Category 2 Plan.  ? ?Exercise goals:  As is. ? ?Behavioral modification strategies: increasing lean protein intake, decreasing simple carbohydrates, meal planning and cooking strategies, keeping healthy foods in the home, and planning for success. ? ?Caitlin Barrett has agreed to follow-up with our clinic in 4 weeks. She was informed of the importance of frequent follow-up visits to maximize her success with intensive lifestyle modifications for her multiple health conditions.  ? ?Objective:  ? ?Blood pressure 131/82, pulse 88, temperature 97.6 ?F (36.4 ?C), height 5' 9"  (1.753 m), weight 256 lb (116.1 kg), last menstrual period 05/26/2016, SpO2 97 %. ?Body mass index is 37.8 kg/m?. ? ?General: Cooperative, alert, well developed, in no acute distress. ?HEENT: Conjunctivae and lids unremarkable. ?Cardiovascular: Regular rhythm.  ?Lungs: Normal work of breathing. ?Neurologic: No focal deficits.  ? ?Lab Results  ?Component Value Date  ? CREATININE 0.78 05/03/2021  ? BUN 19 05/03/2021  ? NA 140 05/03/2021  ? K 4.3 05/03/2021  ? CL 100 05/03/2021  ? CO2 20 05/03/2021  ? ?Lab Results  ?Component Value Date  ? ALT 8 05/03/2021  ? AST 12 05/03/2021  ? ALKPHOS 54 05/03/2021  ? BILITOT <0.2 05/03/2021  ? ?Lab Results  ?Component Value Date  ? HGBA1C 5.3 05/03/2021  ? HGBA1C 5.3 12/29/2020  ? HGBA1C 5.3  03/31/2020  ? HGBA1C 5.5 12/15/2019  ? HGBA1C 5.3 08/13/2019  ? ?Lab Results  ?Component Value Date  ? INSULIN 17.5 05/03/2021  ? INSULIN 10.1 12/29/2020  ? INSULIN 8.9 06/09/2020  ? INSULIN 12.4 03/31/2020  ? INSULIN 13.5 12/15/2019  ? ?Lab Results  ?Component Value Date  ? TSH 0.081 (L) 08/13/2019  ? ?Lab  Results  ?Component Value Date  ? CHOL 200 (H) 05/03/2021  ? HDL 42 05/03/2021  ? LDLCALC 137 (H) 05/03/2021  ? TRIG 117 05/03/2021  ? CHOLHDL 4.8 (H) 05/03/2021  ? ?Lab Results  ?Component Value Date  ? VD25OH 50.5 05/03/2021  ? VD25OH 35.9 12/29/2020  ? VD25OH 47.8 07/28/2020  ? ?Lab Results  ?Component Value Date  ? WBC 9.8 03/31/2020  ? HGB 14.2 03/31/2020  ? HCT 40.9 03/31/2020  ? MCV 88 03/31/2020  ? PLT 394 03/31/2020  ? ?Lab Results  ?Component Value Date  ? IRON 64 08/13/2019  ? TIBC 333 08/13/2019  ? FERRITIN 30 08/13/2019  ? ?Attestation Statements:  ? ?Reviewed by clinician on day of visit: allergies, medications, problem list, medical history, surgical history, family history, social history, and previous encounter notes. ? ?I, Water quality scientist, CMA, am acting as Location manager for Mina Marble, NP. ? ?I have reviewed the above documentation for accuracy and completeness, and I agree with the above. -  Colene [place] d. Wendell Fiebig, NP-C ?

## 2021-07-07 ENCOUNTER — Other Ambulatory Visit (INDEPENDENT_AMBULATORY_CARE_PROVIDER_SITE_OTHER): Payer: Self-pay | Admitting: Adult Health

## 2021-07-07 ENCOUNTER — Encounter (INDEPENDENT_AMBULATORY_CARE_PROVIDER_SITE_OTHER): Payer: Self-pay | Admitting: Adult Health

## 2021-07-07 ENCOUNTER — Encounter: Payer: No Typology Code available for payment source | Admitting: Obstetrics and Gynecology

## 2021-07-07 DIAGNOSIS — E8881 Metabolic syndrome: Secondary | ICD-10-CM

## 2021-07-10 NOTE — Telephone Encounter (Signed)
Please advise 

## 2021-07-10 NOTE — Telephone Encounter (Signed)
Katy ?

## 2021-07-11 NOTE — Telephone Encounter (Signed)
Please advise 

## 2021-07-12 ENCOUNTER — Encounter (INDEPENDENT_AMBULATORY_CARE_PROVIDER_SITE_OTHER): Payer: Self-pay | Admitting: Adult Health

## 2021-07-12 ENCOUNTER — Other Ambulatory Visit (INDEPENDENT_AMBULATORY_CARE_PROVIDER_SITE_OTHER): Payer: Self-pay | Admitting: Adult Health

## 2021-07-12 DIAGNOSIS — E8881 Metabolic syndrome: Secondary | ICD-10-CM

## 2021-07-12 DIAGNOSIS — E88819 Insulin resistance, unspecified: Secondary | ICD-10-CM

## 2021-07-12 NOTE — Telephone Encounter (Signed)
Katy ?

## 2021-07-16 ENCOUNTER — Other Ambulatory Visit: Payer: Self-pay | Admitting: Family Medicine

## 2021-07-16 DIAGNOSIS — M542 Cervicalgia: Secondary | ICD-10-CM

## 2021-07-16 DIAGNOSIS — M545 Low back pain, unspecified: Secondary | ICD-10-CM

## 2021-07-16 DIAGNOSIS — M25511 Pain in right shoulder: Secondary | ICD-10-CM

## 2021-07-18 NOTE — Telephone Encounter (Signed)
Requested medication (s) are due for refill today: yes ? ?Requested medication (s) are on the active medication list: yes ? ?Last refill:  03/28/21 ? ?Future visit scheduled: no ? ?Notes to clinic:  Unable to refill per protocol, cannot delegate. ? ? ? ?  ?Requested Prescriptions  ?Pending Prescriptions Disp Refills  ? cyclobenzaprine (FLEXERIL) 10 MG tablet [Pharmacy Med Name: CYCLOBENZAPRINE 10 MG TABLET] 30 tablet 2  ?  Sig: TAKE 1 TABLET BY MOUTH AT BEDTIME AS NEEDED FOR MUSCLE SPASMS.  ?  ? Not Delegated - Analgesics:  Muscle Relaxants Failed - 07/16/2021  1:11 PM  ?  ?  Failed - This refill cannot be delegated  ?  ?  Failed - Valid encounter within last 6 months  ?  Recent Outpatient Visits   ? ?      ? 1 year ago Chronic right hip pain  ? [place], DO  ? 1 year ago Acute right-sided low back pain without sciatica  ? [place], DO  ? 1 year ago Atypical chest pain  ? [place], DO  ? 2 years ago Polyarthralgia  ? [place], DO  ? 2 years ago Fatigue, unspecified type  ? [place], DO  ? ?  ?  ?Future Appointments   ? ?        ? In 3 days Ag[place]-Etang, Aaron Edelman, MD [facility]  ? In 2 weeks Rubie Maid, MD [facility]  ? In 5 months Su Monks [facility]  ? ?  ? ? ?  ?  ?  ? ? ?

## 2021-07-21 ENCOUNTER — Encounter: Payer: Self-pay | Admitting: Cardiology

## 2021-07-21 ENCOUNTER — Ambulatory Visit: Payer: No Typology Code available for payment source | Admitting: Cardiology

## 2021-07-21 VITALS — BP 124/86 | HR 89 | Ht 69.0 in | Wt 266.0 lb

## 2021-07-21 DIAGNOSIS — Z6839 Body mass index (BMI) 39.0-39.9, adult: Secondary | ICD-10-CM

## 2021-07-21 DIAGNOSIS — R072 Precordial pain: Secondary | ICD-10-CM | POA: Diagnosis not present

## 2021-07-21 DIAGNOSIS — E7800 Pure hypercholesterolemia, unspecified: Secondary | ICD-10-CM | POA: Diagnosis not present

## 2021-07-21 NOTE — Patient Instructions (Signed)
Medication Instructions:  ? ?1.Your physician recommends that you continue on your current medications as directed. Please refer to the Current Medication list given to you today. ? ?*If you need a refill on your cardiac medications before your next appointment, please call your pharmacy* ? ? ?Lab Work: ? ?None Ordered ? ?If you have labs (blood work) drawn today and your tests are completely normal, you will receive your results only by: ?MyChart Message (if you have MyChart) OR ?A paper copy in the mail ?If you have any lab test that is abnormal or we need to change your treatment, we will call you to review the results. ? ? ?Testing/Procedures: ? ?None Ordered ? ? ?Follow-Up: ?At [facility], you and your health needs are our priority.  As part of our continuing mission to provide you with exceptional heart care, we have created designated Provider Care Teams.  These Care Teams include your primary Cardiologist (physician) and Advanced Practice Providers (APPs -  Physician Assistants and Nurse Practitioners) who all work together to provide you with the care you need, when you need it. ? ?We recommend signing up for the patient portal called "MyChart".  Sign up information is provided on this After Visit Summary.  MyChart is used to connect with patients for Virtual Visits (Telemedicine).  Patients are able to view lab/test results, encounter notes, upcoming appointments, etc.  Non-urgent messages can be sent to your provider as well.   ?To learn more about what you can do with MyChart, go to [email or web address].   ? ?Your next appointment:  AS NEEDED ? ?Important Information About Sugar ? ? ? ? ? ? ?

## 2021-07-21 NOTE — Progress Notes (Signed)
?Cardiology Office Note:   ? ?Date:  07/21/2021  ? ?ID:  Caitlin Barrett, DOB 10-07-1970, MRN [id number] ? ?PCP:  Smitty Cords, DO  ?[facility] Cardiologist:  Debbe Odea, MD  ?[facility] Electrophysiologist:  None  ? ?Referring MD: Saralyn Pilar *  ? ?Chief Complaint  ?Patient presents with  ? OTher  ?  12 month follow up -- Patient c.o chest pain that comes and goes. Meds reviewed verbally with patient.   ? ? ?History of Present Illness:   ? ?Caitlin Barrett is a 51 y.o. female with a hx of anxiety, GERD, hyperlipidemia who presents for follow-up.  ? ?Being seen for hyperlipidemia, previously seen for chest pain.  Echo and Myoview was unrevealing.  She states having occasional left-sided chest discomfort not related with exertion.  Otherwise has no concerns at this time.  He is working on losing weight, started on metformin due to insulin resistance.  Planning on following up with PCP regarding trial of Ozempic / Wegovy. ? ?Prior notes. ?Echo 02/2020 normal systolic and diastolic function, EF 55 to 60% ?Lexiscan Myoview 02/2020, no evidence for ischemia. ? ?Past Medical History:  ?Diagnosis Date  ? Allergy   ? seasonal.  Takes allergy shots at [facility]  ? Anemia   ? Anxiety   ? Arthritis   ? Asthma   ? exercise induced--no inhalers  ? Back pain   ? Cancer [facility]) 2010  ? Basal cell skin cancer Lt eye, skin graft above Lt eye  ? Constipation   ? Costochondritis   ? Depression   ? Dysmenorrhea   ? Eczema   ? Edema, lower extremity   ? GERD (gastroesophageal reflux disease)   ? Headache   ? Hypothyroid   ? Nodules and Fine needle aspiration  ? IBS (irritable bowel syndrome)   ? Lab test positive for detection of COVID-19 virus   ? November 2020  ? Myalgia   ? Osteoarthritis   ? Painful menstrual periods   ? Palpitations   ? Plantar fasciitis   ? Shortness of breath   ? Sprain of carpal (joint) of wrist   ? Vitamin B 12 deficiency   ? Vitamin D deficiency   ? ? ?Past Surgical  History:  ?Procedure Laterality Date  ? ABDOMINAL HYSTERECTOMY Bilateral 07/02/2016  ? Procedure: HYSTERECTOMY ABDOMINAL WITH BILATERAL SALPINGECTOMY-MIDLINE INCISION;  Surgeon: Herold Harms, MD;  Location: [facility] ORS;  Service: Gynecology;  Laterality: Bilateral;  ? CHOLECYSTECTOMY  2009  ? COLONOSCOPY WITH PROPOFOL N/A 01/26/2021  ? Procedure: COLONOSCOPY WITH PROPOFOL;  Surgeon: Toney Reil, MD;  Location[facility] ENDOSCOPY;  Service: Gastroenterology;  Laterality: N/A;  ? fibromalga    ? HERNIA REPAIR  1974  ? umbilical  ? NASAL SEPTOPLASTY W/ TURBINOPLASTY Bilateral 03/03/2021  ? Procedure: NASAL SEPTOPLASTY WITH SUBMUCOSAL RESECTION OF TURBINATE;  Surgeon: Linus Salmons, MD;  Location: [facility];  Service: ENT;  Laterality: Bilateral;  ? SKIN GRAFT  2010  ? TONSILLECTOMY    ? ? ?Current Medications: ?Current Meds  ?Medication Sig  ? albuterol (VENTOLIN HFA) 108 (90 Base) MCG/ACT inhaler TAKE 2 PUFFS BY MOUTH EVERY 6 HOURS AS NEEDED FOR WHEEZE OR SHORTNESS OF BREATH  ? celecoxib (CELEBREX) 100 MG capsule Take 100 mg by mouth daily.  ? clobetasol cream (TEMOVATE) 0.05 % Apply 1 application topically 2 (two) times daily. For up to 1-2 weeks then as needed. Do not use on sensitive skin.  ? COLLAGEN-BORON-HYALURONIC ACID PO Take  by mouth.  ? cyclobenzaprine (FLEXERIL) 10 MG tablet TAKE 1 TABLET BY MOUTH AT BEDTIME AS NEEDED FOR MUSCLE SPASMS.  ? EPINEPHrine 0.3 mg/0.3 mL IJ SOAJ injection 0.3 mg as needed.  ? estradiol (ESTRACE) 1 MG tablet TAKE 1 TABLET BY MOUTH EVERY DAY  ? fluticasone (FLONASE) 50 MCG/ACT nasal spray Place into both nostrils.  ? loratadine (CLARITIN) 10 MG tablet Take 10 mg by mouth daily as needed.  ? Loratadine 10 MG CAPS Take by mouth.  ? metFORMIN (GLUCOPHAGE) 500 MG tablet 1 TAB WITH BREAKFAST  ? Misc Natural Products (OSTEO BI-FLEX ADV TRIPLE ST PO) Take by mouth.  ? Multiple Vitamins-Minerals (MULTIVITAMIN WOMEN PO) Take by mouth.  ? omeprazole (PRILOSEC) 40 MG capsule  Take 40 mg by mouth as needed.  ? ondansetron (ZOFRAN) 4 MG tablet Take 1 tablet (4 mg total) by mouth daily as needed for nausea or vomiting.  ? ondansetron (ZOFRAN-ODT) 8 MG disintegrating tablet Take 1 tablet (8 mg total) by mouth every 8 (eight) hours as needed for nausea or vomiting.  ? oxymetazoline (AFRIN) 0.05 % nasal spray Place 1 spray into both nostrils as needed.   ? Probiotic Product (TRUBIOTICS) CAPS Take 1 capsule by mouth daily.  ? progesterone (PROMETRIUM) 200 MG capsule TAKE 1 CAPSULE BY MOUTH EVERY DAY  ? triamcinolone cream (KENALOG) 0.1 % APPLY THIN FILM TWICE DAILY TO ECZEMA UNTIL CLEAR  ? TURMERIC PO Take by mouth daily.  ? Vitamin D, Ergocalciferol, (DRISDOL) 1.25 MG (50000 UNIT) CAPS capsule TAKE ONE CAPSULE BY MOUTH ONCE PER WEEK  ?  ? ?Allergies:   Iodinated contrast media and Codeine  ? ?Social History  ? ?Socioeconomic History  ? Marital status: Married  ?  Spouse name: Not on file  ? Number of children: Not on file  ? Years of education: Not on file  ? Highest education level: Not on file  ?Occupational History  ? Occupation: Psychologist, prison and probation services  ?Tobacco Use  ? Smoking status: Never  ? Smokeless tobacco: Never  ?Vaping Use  ? Vaping Use: Never used  ?Substance and Sexual Activity  ? Alcohol use: Yes  ?  Comment: rare  ? Drug use: No  ? Sexual activity: Yes  ?  Birth control/protection: Surgical  ?Other Topics Concern  ? Not on file  ?Social History Narrative  ? Not on file  ? ?Social Determinants of Health  ? ?Financial Resource Strain: Not on file  ?Food Insecurity: Not on file  ?Transportation Needs: Not on file  ?Physical Activity: Not on file  ?Stress: Not on file  ?Social Connections: Not on file  ?  ? ?Family History: ?The patient's family history includes Cancer in her father; Diabetes in her maternal grandfather, maternal grandmother, and mother; Heart attack in her maternal grandfather; Heart disease in her maternal grandfather and maternal grandmother; Hyperlipidemia in her  mother; Hypertension in her maternal grandmother and mother; Obesity in her mother; Sleep apnea in her mother; Stroke in her maternal grandmother. ? ?ROS:   ?Please see the history of present illness.    ? All other systems reviewed and are negative. ? ?EKGs/Labs/Other Studies Reviewed:   ? ?The following studies were reviewed today: ? ? ?EKG:  EKG is  ordered today.  The ekg ordered today demonstrates normal sinus rhythm, normal EKG ? ?Recent Labs: ?05/03/2021: ALT 8; BUN 19; Creatinine, Ser 0.78; Potassium 4.3; Sodium 140  ?Recent Lipid Panel ?   ?Component Value Date/Time  ? CHOL 200 (H) 05/03/2021  6213  ? TRIG 117 05/03/2021 0803  ? HDL 42 05/03/2021 0803  ? CHOLHDL 4.8 (H) 05/03/2021 0803  ? CHOLHDL 4.8 10/23/2017 0830  ? VLDL 19 10/01/2016 0835  ? LDLCALC 137 (H) 05/03/2021 0803  ? LDLCALC 128 (H) 10/23/2017 0830  ? ? ? ?Risk Assessment/Calculations:   ? ? ? ?Physical Exam:   ? ?VS:  BP 124/86 (BP Location: Left Arm, Patient Position: Sitting, Cuff Size: Large)   Pulse 89   Ht 5\' 9"  (1.753 m)   Wt 266 lb (120.7 kg)   LMP 05/26/2016 (Exact Date)   SpO2 97%   BMI 39.28 kg/m?    ? ?Wt Readings from Last 3 Encounters:  ?07/21/21 266 lb (120.7 kg)  ?06/27/21 256 lb (116.1 kg)  ?06/21/21 264 lb (119.7 kg)  ?  ? ?GEN:  Well nourished, well developed in no acute distress ?HEENT: Normal ?NECK: No JVD; No carotid bruits ?CARDIAC: RRR, no murmurs, rubs, gallops ?RESPIRATORY:  Clear to auscultation without rales, wheezing or rhonchi  ?ABDOMEN: Soft, non-tender, non-distended ?MUSCULOSKELETAL:  No edema; No deformity  ?SKIN: Warm and dry ?NEUROLOGIC:  Alert and oriented x 3 ?PSYCHIATRIC:  Normal affect  ? ?ASSESSMENT:   ? ?1. Precordial pain   ?2. Pure hypercholesterolemia   ?3. BMI 39.0-39.9,adult   ? ? ?PLAN:   ? ?In order of problems listed above: ? ?chest pain, reproducible with palpation suggesting musculoskeletal etiology.  Prior work-up with echo and Lexiscan Myoview were normal.   ?Hyperlipidemia, ASCVD 1.4%.   Not in statin benefit group, continue low-cholesterol diet. ?Obesity, weight loss advised.  Plans to follow-up with PCP regarding starting Ozempic/Wegovy. ? ?Follow-up as needed ? ?Shared Decision Making/Inform

## 2021-07-25 ENCOUNTER — Ambulatory Visit (INDEPENDENT_AMBULATORY_CARE_PROVIDER_SITE_OTHER): Payer: No Typology Code available for payment source | Admitting: Adult Health

## 2021-07-25 ENCOUNTER — Encounter (INDEPENDENT_AMBULATORY_CARE_PROVIDER_SITE_OTHER): Payer: Self-pay | Admitting: Adult Health

## 2021-07-25 VITALS — BP 124/83 | HR 74 | Temp 97.9°F | Ht 69.0 in | Wt 256.0 lb

## 2021-07-25 DIAGNOSIS — F339 Major depressive disorder, recurrent, unspecified: Secondary | ICD-10-CM

## 2021-07-25 DIAGNOSIS — Z6837 Body mass index (BMI) 37.0-37.9, adult: Secondary | ICD-10-CM

## 2021-07-25 DIAGNOSIS — Z9189 Other specified personal risk factors, not elsewhere classified: Secondary | ICD-10-CM

## 2021-07-25 DIAGNOSIS — R600 Localized edema: Secondary | ICD-10-CM | POA: Diagnosis not present

## 2021-07-25 DIAGNOSIS — E8881 Metabolic syndrome: Secondary | ICD-10-CM | POA: Diagnosis not present

## 2021-07-25 DIAGNOSIS — E669 Obesity, unspecified: Secondary | ICD-10-CM | POA: Diagnosis not present

## 2021-07-25 MED ORDER — DULOXETINE HCL 30 MG PO CPEP: 30.0000 mg | ORAL_CAPSULE | Freq: Every day | ORAL | 0 refills | Status: DC

## 2021-08-03 ENCOUNTER — Ambulatory Visit (INDEPENDENT_AMBULATORY_CARE_PROVIDER_SITE_OTHER): Payer: No Typology Code available for payment source | Admitting: Obstetrics and Gynecology

## 2021-08-03 ENCOUNTER — Encounter: Payer: Self-pay | Admitting: Obstetrics and Gynecology

## 2021-08-03 ENCOUNTER — Other Ambulatory Visit (HOSPITAL_COMMUNITY)
Admission: RE | Admit: 2021-08-03 | Discharge: 2021-08-03 | Disposition: A | Discharge status: Home / Self Care | Payer: No Typology Code available for payment source | Source: Ambulatory Visit | Attending: Obstetrics and Gynecology | Admitting: Obstetrics and Gynecology

## 2021-08-03 VITALS — BP 120/62 | HR 82 | Ht 69.0 in | Wt 263.5 lb

## 2021-08-03 DIAGNOSIS — Z01419 Encounter for gynecological examination (general) (routine) without abnormal findings: Secondary | ICD-10-CM

## 2021-08-03 DIAGNOSIS — N898 Other specified noninflammatory disorders of vagina: Secondary | ICD-10-CM | POA: Diagnosis not present

## 2021-08-03 DIAGNOSIS — Z1231 Encounter for screening mammogram for malignant neoplasm of breast: Secondary | ICD-10-CM

## 2021-08-03 DIAGNOSIS — Z124 Encounter for screening for malignant neoplasm of cervix: Secondary | ICD-10-CM | POA: Insufficient documentation

## 2021-08-03 MED ORDER — FLUCONAZOLE 150 MG PO TABS: 150.0000 mg | ORAL_TABLET | Freq: Once | ORAL | 2 refills | Status: AC

## 2021-08-03 NOTE — Progress Notes (Signed)
? ?ANNUAL PREVENTATIVE CARE GYNECOLOGY  ENCOUNTER NOTE ? ?Subjective:  ?    ? Caitlin Barrett is a 51 y.o. G0P0000 female here for a routine annual gynecologic exam. The patient is sexually active. The patient is taking hormone replacement therapy. Patient denies post-menopausal vaginal bleeding. The patient wears seatbelts: yes. The patient participates in regular exercise: yes. Has the patient ever been transfused or tattooed?: no. The patient reports that there is not domestic violence in her life. ? ?Current complaints: ?1.  She has been having some vaginal discharge x 3 days. She denies itching and odor.  ?  ?Gynecologic History ?Patient's last menstrual period was 05/26/2016 (exact date). ?Contraception: status post hysterectomy ?Last Pap: 06/29/2019. Results were: abnormal (LGSIL).  ?Last mammogram: 01/03/2021. Results were: normal ?Last Colonoscopy: 01/26/2021: Due in 3 years ? ? ? ?Obstetric History ?OB History  ?Gravida Para Term Preterm AB Living  ?0 0 0 0 0 0  ?SAB IAB Ectopic Multiple Live Births  ?0 0 0 0 0  ? ? ?Past Medical History:  ?Diagnosis Date  ? Allergy   ? seasonal.  Takes allergy shots at [facility]  ? Anemia   ? Anxiety   ? Arthritis   ? Asthma   ? exercise induced--no inhalers  ? Back pain   ? Cancer [facility]) 2010  ? Basal cell skin cancer Lt eye, skin graft above Lt eye  ? Constipation   ? Costochondritis   ? Depression   ? Dysmenorrhea   ? Eczema   ? Edema, lower extremity   ? GERD (gastroesophageal reflux disease)   ? Headache   ? Hypothyroid   ? Nodules and Fine needle aspiration  ? IBS (irritable bowel syndrome)   ? Lab test positive for detection of COVID-19 virus   ? November 2020  ? Myalgia   ? Osteoarthritis   ? Painful menstrual periods   ? Palpitations   ? Plantar fasciitis   ? Shortness of breath   ? Sprain of carpal (joint) of wrist   ? Vitamin B 12 deficiency   ? Vitamin D deficiency   ? ? ?Family History  ?Problem Relation Age of Onset  ? Diabetes Mother   ? Hyperlipidemia  Mother   ? Hypertension Mother   ? Sleep apnea Mother   ? Obesity Mother   ? Cancer Father   ?     lung CA  ? Heart disease Maternal Grandmother   ? Stroke Maternal Grandmother   ? Hypertension Maternal Grandmother   ? Diabetes Maternal Grandmother   ? Heart disease Maternal Grandfather   ? Diabetes Maternal Grandfather   ? Heart attack Maternal Grandfather   ? ? ?Past Surgical History:  ?Procedure Laterality Date  ? ABDOMINAL HYSTERECTOMY Bilateral 07/02/2016  ? Procedure: HYSTERECTOMY ABDOMINAL WITH BILATERAL SALPINGECTOMY-MIDLINE INCISION;  Surgeon: Brayton Mars, MD;  Location: [facility] ORS;  Service: Gynecology;  Laterality: Bilateral;  ? CHOLECYSTECTOMY  2009  ? COLONOSCOPY WITH PROPOFOL N/A 01/26/2021  ? Procedure: COLONOSCOPY WITH PROPOFOL;  Surgeon: Lin Landsman, MD;  Location: [facility] ENDOSCOPY;  Service: Gastroenterology;  Laterality: N/A;  ? fibromalga    ? HERNIA REPAIR  4503  ? umbilical  ? NASAL SEPTOPLASTY W/ TURBINOPLASTY Bilateral 03/03/2021  ? Procedure: NASAL SEPTOPLASTY WITH SUBMUCOSAL RESECTION OF TURBINATE;  Surgeon: Beverly Gust, MD;  Location: [place];  Service: ENT;  Laterality: Bilateral;  ? SKIN GRAFT  2010  ? TONSILLECTOMY    ? ? ?Social History  ? ?Socioeconomic  History  ? Marital status: Married  ?  Spouse name: Not on file  ? Number of children: Not on file  ? Years of education: Not on file  ? Highest education level: Not on file  ?Occupational History  ? Occupation: Games developer  ?Tobacco Use  ? Smoking status: Never  ? Smokeless tobacco: Never  ?Vaping Use  ? Vaping Use: Never used  ?Substance and Sexual Activity  ? Alcohol use: Yes  ?  Comment: rare  ? Drug use: No  ? Sexual activity: Yes  ?  Birth control/protection: Surgical  ?Other Topics Concern  ? Not on file  ?Social History Narrative  ? Not on file  ? ?Social Determinants of Health  ? ?Financial Resource Strain: Not on file  ?Food Insecurity: Not on file  ?Transportation Needs: Not on file   ?Physical Activity: Not on file  ?Stress: Not on file  ?Social Connections: Not on file  ?Intimate Partner Violence: Not on file  ? ? ?Current Outpatient Medications on File Prior to Visit  ?Medication Sig Dispense Refill  ? albuterol (VENTOLIN HFA) 108 (90 Base) MCG/ACT inhaler TAKE 2 PUFFS BY MOUTH EVERY 6 HOURS AS NEEDED FOR WHEEZE OR SHORTNESS OF BREATH 8.5 each 0  ? celecoxib (CELEBREX) 100 MG capsule Take 100 mg by mouth daily.    ? clobetasol cream (TEMOVATE) 1.61 % Apply 1 application topically 2 (two) times daily. For up to 1-2 weeks then as needed. Do not use on sensitive skin. 30 g 1  ? COLLAGEN-BORON-HYALURONIC ACID PO Take by mouth.    ? cyclobenzaprine (FLEXERIL) 10 MG tablet TAKE 1 TABLET BY MOUTH AT BEDTIME AS NEEDED FOR MUSCLE SPASMS. 30 tablet 2  ? DULoxetine (CYMBALTA) 30 MG capsule Take 1 capsule (30 mg total) by mouth daily. 30 capsule 0  ? EPINEPHrine 0.3 mg/0.3 mL IJ SOAJ injection 0.3 mg as needed.    ? estradiol (ESTRACE) 1 MG tablet TAKE 1 TABLET BY MOUTH EVERY DAY 90 tablet 4  ? fluticasone (FLONASE) 50 MCG/ACT nasal spray Place into both nostrils.    ? loratadine (CLARITIN) 10 MG tablet Take 10 mg by mouth daily as needed.    ? Loratadine 10 MG CAPS Take by mouth.    ? Misc Natural Products (OSTEO BI-FLEX ADV TRIPLE ST PO) Take by mouth.    ? Multiple Vitamins-Minerals (MULTIVITAMIN WOMEN PO) Take by mouth.    ? omeprazole (PRILOSEC) 40 MG capsule Take 40 mg by mouth as needed.    ? ondansetron (ZOFRAN) 4 MG tablet Take 1 tablet (4 mg total) by mouth daily as needed for nausea or vomiting. 30 tablet 1  ? ondansetron (ZOFRAN-ODT) 8 MG disintegrating tablet Take 1 tablet (8 mg total) by mouth every 8 (eight) hours as needed for nausea or vomiting. 20 tablet 0  ? oxymetazoline (AFRIN) 0.05 % nasal spray Place 1 spray into both nostrils as needed.     ? Probiotic Product (TRUBIOTICS) CAPS Take 1 capsule by mouth daily.    ? progesterone (PROMETRIUM) 200 MG capsule TAKE 1 CAPSULE BY MOUTH  EVERY DAY 90 capsule 3  ? triamcinolone cream (KENALOG) 0.1 % APPLY THIN FILM TWICE DAILY TO ECZEMA UNTIL CLEAR    ? TURMERIC PO Take by mouth daily.    ? Vitamin D, Ergocalciferol, (DRISDOL) 1.25 MG (50000 UNIT) CAPS capsule TAKE ONE CAPSULE BY MOUTH ONCE PER WEEK 4 capsule 0  ? ?No current facility-administered medications on file prior to visit.  ? ? ?  Allergies  ?Allergen Reactions  ? Iodinated Contrast Media Hives  ?  Other reaction(s): Flushing  ? Codeine Nausea Only  ?  Other reaction(s): Dizziness, Vomiting  ? ? ? ? ?Review of Systems ?ROS ?Review of Systems - General ROS: negative for - chills, fatigue, fever, hot flashes, night sweats, weight gain or weight loss ?Psychological ROS: negative for - anxiety, decreased libido, depression, mood swings, physical abuse or sexual abuse ?Ophthalmic ROS: negative for - blurry vision, eye pain or loss of vision ?ENT ROS: negative for - headaches, hearing change, visual changes or vocal changes ?Allergy and Immunology ROS: negative for - hives, itchy/watery eyes or seasonal allergies ?Hematological and Lymphatic ROS: negative for - bleeding problems, bruising, swollen lymph nodes or weight loss ?Endocrine ROS: negative for - galactorrhea, hair pattern changes, hot flashes, malaise/lethargy, mood swings, palpitations, polydipsia/polyuria, skin changes, temperature intolerance or unexpected weight changes ?Breast ROS: negative for - new or changing breast lumps or nipple discharge ?Respiratory ROS: negative for - cough or shortness of breath ?Cardiovascular ROS: negative for - chest pain, irregular heartbeat, palpitations or shortness of breath ?Gastrointestinal ROS: no abdominal pain, change in bowel habits, or black or bloody stools ?Genito-Urinary ROS: no dysuria, trouble voiding, or hematuria ?Musculoskeletal ROS: negative for - joint pain or joint stiffness ?Neurological ROS: negative for - bowel and bladder control changes ?Dermatological ROS: negative for rash and  skin lesion changes ?  ?Objective:  ? ?BP 120/62   Pulse 82   Ht 5' 9"  (1.753 m)   Wt 263 lb 8 oz (119.5 kg)   LMP 05/26/2016 (Exact Date)   BMI 38.91 kg/m?  ?CONSTITUTIONAL: Well-developed, well-nourished fe

## 2021-08-03 NOTE — Patient Instructions (Signed)
Breast Self-Awareness ?Breast self-awareness is knowing how your breasts look and feel. You need to: ?Check your breasts on a regular basis. ?Tell your doctor about any changes. ?Become familiar with the look and feel of your breasts. This can help you catch a breast problem while it is still small and can be treated. You should do breast self-exams even if you have breast implants. ?What you need: ?A mirror. ?A well-lit room. ?A pillow or other soft object. ?How to do a breast self-exam ?Follow these steps to do a breast self-exam: ?Look for changes ? ?Take off all the clothes above your waist. ?Stand in front of a mirror in a room with good lighting. ?Put your hands down at your sides. ?Compare your breasts in the mirror. Look for any difference between them, such as: ?A difference in shape. ?A difference in size. ?Wrinkles, dips, and bumps in one breast and not the other. ?Look at each breast for changes in the skin, such as: ?Redness. ?Scaly areas. ?Skin that has gotten thicker. ?Dimpling. ?Open sores (ulcers). ?Look for changes in your nipples, such as: ?Fluid coming out of a nipple. ?Fluid around a nipple. ?Bleeding. ?Dimpling. ?Redness. ?A nipple that looks pushed in (retracted), or that has changed position. ?Feel for changes ?Lie on your back. ?Feel each breast. To do this: ?Pick a breast to feel. ?Place a pillow under the shoulder closest to that breast. Put the arm closest to that breast behind your head. ?Feel the nipple area of that breast using the hand of your other arm. Feel the area with the pads of your three middle fingers by making small circles with your fingers. Use light, medium, and firm pressure. ?Continue the overlapping circles, moving downward over the breast. Keep making circles with your fingers. Stop when you feel your ribs. ?Start making circles with your fingers again, this time going upward until you reach your collarbone. ?Then, make circles outward across your breast and into your  armpit area. ?Squeeze your nipple. Check for discharge and lumps. ?Repeat these steps to check your other breast. ?Sit or stand in the tub or shower. ?With soapy water on your skin, feel each breast the same way you did when you were lying down. ?Write down what you find ?Writing down what you find can help you remember what to tell your doctor. Write down: ?What is normal for each breast. ?Any changes you find in each breast. These include: ?The kind of changes you find. ?A tender or painful breast. ?Any lump you find. Write down its size and where it is. ?When you last had your monthly period (menstrual cycle). ?General tips ?If you are breastfeeding, the best time to check your breasts is after you feed your baby or after you use a breast pump. ?If you get monthly bleeding, the best time to check your breasts is 5-7 days after your monthly cycle ends. ?With time, you will become comfortable with the self-exam. You will also start to know if there are changes in your breasts. ?Contact a doctor if: ?You see a change in the shape or size of your breasts or nipples. ?You see a change in the skin of your breast or nipples, such as red or scaly skin. ?You have fluid coming from your nipples that is not normal. ?You find a new lump or thick area. ?You have breast pain. ?You have any concerns about your breast health. ?Summary ?Breast self-awareness includes looking for changes in your breasts and feeling for changes   within your breasts. ?You should do breast self-awareness in front of a mirror in a well-lit room. ?If you get monthly periods (menstrual cycles), the best time to check your breasts is 5-7 days after your period ends. ?Tell your doctor about any changes you see in your breasts. Changes include changes in size, changes on the skin, painful or tender breasts, or fluid from your nipples that is not normal. ?This information is not intended to replace advice given to you by your health care provider. Make sure  you discuss any questions you have with your health care provider. ?Document Revised: 01/12/2021 Document Reviewed: 01/12/2021 ?Elsevier Patient Education ? Caitlin Barrett. ?Preventive Care 29-58 Years Old, Female ?Preventive care refers to lifestyle choices and visits with your health care provider that can promote health and wellness. Preventive care visits are also called wellness exams. ?What can I expect for my preventive care visit? ?Counseling ?Your health care provider may ask you questions about your: ?Medical history, including: ?Past medical problems. ?Family medical history. ?Pregnancy history. ?Current health, including: ?Menstrual cycle. ?Method of birth control. ?Emotional well-being. ?Home life and relationship well-being. ?Sexual activity and sexual health. ?Lifestyle, including: ?Alcohol, nicotine or tobacco, and drug use. ?Access to firearms. ?Diet, exercise, and sleep habits. ?Work and work Statistician. ?Sunscreen use. ?Safety issues such as seatbelt and bike helmet use. ?Physical exam ?Your health care provider will check your: ?Height and weight. These may be used to calculate your BMI (body mass index). BMI is a measurement that tells if you are at a healthy weight. ?Waist circumference. This measures the distance around your waistline. This measurement also tells if you are at a healthy weight and may help predict your risk of certain diseases, such as type 2 diabetes and high blood pressure. ?Heart rate and blood pressure. ?Body temperature. ?Skin for abnormal spots. ?What immunizations do I need? ? ?Vaccines are usually given at various ages, according to a schedule. Your health care provider will recommend vaccines for you based on your age, medical history, and lifestyle or other factors, such as travel or where you work. ?What tests do I need? ?Screening ?Your health care provider may recommend screening tests for certain conditions. This may include: ?Lipid and cholesterol  levels. ?Diabetes screening. This is done by checking your blood sugar (glucose) after you have not eaten for a while (fasting). ?Pelvic exam and Pap test. ?Hepatitis B test. ?Hepatitis C test. ?HIV (human immunodeficiency virus) test. ?STI (sexually transmitted infection) testing, if you are at risk. ?Lung cancer screening. ?Colorectal cancer screening. ?Mammogram. Talk with your health care provider about when you should start having regular mammograms. This may depend on whether you have a family history of breast cancer. ?BRCA-related cancer screening. This may be done if you have a family history of breast, ovarian, tubal, or peritoneal cancers. ?Bone density scan. This is done to screen for osteoporosis. ?Talk with your health care provider about your test results, treatment options, and if necessary, the need for more tests. ?Follow these instructions at home: ?Eating and drinking ? ?Eat a diet that includes fresh fruits and vegetables, whole grains, lean protein, and low-fat dairy products. ?Take vitamin and mineral supplements as recommended by your health care provider. ?Do not drink alcohol if: ?Your health care provider tells you not to drink. ?You are pregnant, may be pregnant, or are planning to become pregnant. ?If you drink alcohol: ?Limit how much you have to 0-1 drink a day. ?Know how much alcohol is in your  drink. In the [place] one drink equals one 12 oz bottle of beer (355 mL), one 5 oz glass of wine (148 mL), or one 1? oz glass of hard liquor (44 mL). ?Lifestyle ?Brush your teeth every morning and night with fluoride toothpaste. Floss one time each day. ?Exercise for at least 30 minutes 5 or more days each week. ?Do not use any products that contain nicotine or tobacco. These products include cigarettes, chewing tobacco, and vaping devices, such as e-cigarettes. If you need help quitting, ask your health care provider. ?Do not use drugs. ?If you are sexually active, practice safe sex. Use a  condom or other form of protection to prevent STIs. ?If you do not wish to become pregnant, use a form of birth control. If you plan to become pregnant, see your health care provider for a prepregnancy visit. ?Take

## 2021-08-03 NOTE — Progress Notes (Addendum)
Chief Complaint:   OBESITY Caitlin Barrett is here to discuss her progress with her obesity treatment plan along with follow-up of her obesity related diagnoses. Caitlin Barrett is on the Category 2 Plan and states she is following her eating plan approximately 88% of the time. Caitlin Barrett states she is walking 30 minutes 3 times per week.  Today's visit was #: 28 Starting weight: 277 lbs Starting date: 08/13/2019 Today's weight: 256 lbs Today's date: 07/25/2021 Total lbs lost to date: 21 Total lbs lost since last in-office visit: 0  Interim History:  Caitlin Barrett endorses increased bilateral lower extremity pain for the last few months. Caitlin Barrett is participating in out patient physical therapy for bilateral knee pain/instability, 2 times a week for the last 2 weeks.  She also exercising at home.  Subjective:   1. Bilateral lower extremity edema For the last several months, Caitlin Barrett experiences bilateral lower extremity edema, worsening throughout the day.  She believes this began after Metformin started. She is currently taking Metformin 500 mg 1/2 tablet QD.  2. Insulin resistance Metformin may be causing lower extremity edema. Caitlin Barrett denies family history of MTC or personal history of pancreatitis.  I have discussed risk/benefits of GLP-1 therapy.    3. Depression, recurrent ([facility]) Caitlin Barrett reports increase in depression symptoms. Increase in sadness, hopelessness and fatigue. She denies suicidal ideas, and homicidal ideas.  She has been on Wellbutrin and other prescriptions not tolerating any well.  4. At risk for activity intolerance Caitlin Barrett is at risk for exercise intolerance due to bilateral knee pain and increased depression symptoms.  Assessment/Plan:   1. Bilateral lower extremity edema [name] is encouraged  to wear compression socks, elevate legs when able, and continue with PT exercises. Stop Metformin.   2. Insulin resistance [name] is to stop Metformin.   3.  Depression, recurrent Caitlin Barrett) Referral BH/psychology, discussed red flags symptoms- seek immediate medical assistance. She verbalized understanding and in agreement. Start Cymbalta 30 mg daily, for 1 month with no refills.   Cymbalta aims to treat depression and pain.  - Ambulatory referral to Psychology  -Start- DULoxetine (CYMBALTA) 30 MG capsule; Take 1 capsule (30 mg total) by mouth daily.  Dispense: 30 capsule; Refill: 0  4. At risk for activity intolerance Caitlin Barrett was given approximately 15 minutes of exercise intolerance counseling today. She is 51 y.o. female and has risk factors exercise intolerance including obesity. We discussed intensive lifestyle modifications today with an emphasis on specific weight loss instructions and strategies. Caitlin Barrett will slowly increase activity as tolerated.  Repetitive spaced learning was employed today to elicit superior memory formation and behavioral change.  5. Obesity, current BMI 37.8 Caitlin Barrett is currently in the action stage of change. As such, her goal is to continue with weight loss efforts. She has agreed to the Category 2 Plan.   Caitlin Barrett is to wear compression socks and stop Metformin therapy.  Exercise goals: As is.  Behavioral modification strategies: increasing lean protein intake, decreasing simple carbohydrates, meal planning and cooking strategies, keeping healthy foods in the home, and planning for success.  Caitlin Barrett has agreed to follow-up with our clinic in 2 weeks. She was informed of the importance of frequent follow-up visits to maximize her success with intensive lifestyle modifications for her multiple health conditions.   Objective:   Blood pressure 124/83, pulse 74, temperature 97.9 F (36.6 C), height 5\' 9"  (1.753 m), weight 256 lb (116.1 kg), last menstrual period 05/26/2016, SpO2 98 %. Body mass index is 37.8 kg/m.  General:  Cooperative, alert, well developed, in no acute distress. HEENT: Conjunctivae and lids  unremarkable. Cardiovascular: Regular rhythm.  Lungs: Normal work of breathing. Neurologic: No focal deficits.   Lab Results  Component Value Date   CREATININE 0.78 05/03/2021   BUN 19 05/03/2021   NA 140 05/03/2021   K 4.3 05/03/2021   CL 100 05/03/2021   CO2 20 05/03/2021   Lab Results  Component Value Date   ALT 8 05/03/2021   AST 12 05/03/2021   ALKPHOS 54 05/03/2021   BILITOT <0.2 05/03/2021   Lab Results  Component Value Date   HGBA1C 5.3 05/03/2021   HGBA1C 5.3 12/29/2020   HGBA1C 5.3 03/31/2020   HGBA1C 5.5 12/15/2019   HGBA1C 5.3 08/13/2019   Lab Results  Component Value Date   INSULIN 17.5 05/03/2021   INSULIN 10.1 12/29/2020   INSULIN 8.9 06/09/2020   INSULIN 12.4 03/31/2020   INSULIN 13.5 12/15/2019   Lab Results  Component Value Date   TSH 0.081 (L) 08/13/2019   Lab Results  Component Value Date   CHOL 200 (H) 05/03/2021   HDL 42 05/03/2021   LDLCALC 137 (H) 05/03/2021   TRIG 117 05/03/2021   CHOLHDL 4.8 (H) 05/03/2021   Lab Results  Component Value Date   VD25OH 50.5 05/03/2021   VD25OH 35.9 12/29/2020   VD25OH 47.8 07/28/2020   Lab Results  Component Value Date   WBC 9.8 03/31/2020   HGB 14.2 03/31/2020   HCT 40.9 03/31/2020   MCV 88 03/31/2020   PLT 394 03/31/2020   Lab Results  Component Value Date   IRON 64 08/13/2019   TIBC 333 08/13/2019   FERRITIN 30 08/13/2019   Attestation Statements:   Reviewed by clinician on day of visit: allergies, medications, problem list, medical history, surgical history, family history, social history, and previous encounter notes.  I, Caitlin Barrett, RMA, am acting as transcriptionist for William Hamburger, NP.  I have reviewed the above documentation for accuracy and completeness, and I agree with the above. -  Caitlin Wire d. Keshia Weare, NP-C

## 2021-08-05 ENCOUNTER — Encounter: Payer: Self-pay | Admitting: Obstetrics and Gynecology

## 2021-08-06 DIAGNOSIS — R600 Localized edema: Secondary | ICD-10-CM | POA: Insufficient documentation

## 2021-08-07 LAB — CERVICOVAGINAL ANCILLARY ONLY
Bacterial Vaginitis (gardnerella): NEGATIVE
Candida Glabrata: NEGATIVE
Candida Vaginitis: POSITIVE — AB
Comment: NEGATIVE
Comment: NEGATIVE
Comment: NEGATIVE

## 2021-08-09 ENCOUNTER — Ambulatory Visit (INDEPENDENT_AMBULATORY_CARE_PROVIDER_SITE_OTHER): Payer: No Typology Code available for payment source | Admitting: Adult Health

## 2021-08-09 ENCOUNTER — Encounter (INDEPENDENT_AMBULATORY_CARE_PROVIDER_SITE_OTHER): Payer: Self-pay | Admitting: Adult Health

## 2021-08-09 VITALS — BP 122/80 | HR 67 | Temp 97.6°F | Ht 69.0 in | Wt 254.0 lb

## 2021-08-09 DIAGNOSIS — E559 Vitamin D deficiency, unspecified: Secondary | ICD-10-CM

## 2021-08-09 DIAGNOSIS — F339 Major depressive disorder, recurrent, unspecified: Secondary | ICD-10-CM

## 2021-08-09 DIAGNOSIS — Z6837 Body mass index (BMI) 37.0-37.9, adult: Secondary | ICD-10-CM | POA: Diagnosis not present

## 2021-08-09 DIAGNOSIS — E669 Obesity, unspecified: Secondary | ICD-10-CM

## 2021-08-09 MED ORDER — VITAMIN D (ERGOCALCIFEROL) 1.25 MG (50000 UNIT) PO CAPS: ORAL_CAPSULE | ORAL | 0 refills | Status: DC

## 2021-08-09 MED ORDER — DULOXETINE HCL 30 MG PO CPEP: 30.0000 mg | ORAL_CAPSULE | Freq: Every day | ORAL | 0 refills | Status: DC

## 2021-08-10 NOTE — Progress Notes (Signed)
Chief Complaint:   OBESITY Caitlin Barrett is here to discuss her progress with her obesity treatment plan along with follow-up of her obesity related diagnoses. Caitlin Barrett is on the Category 2 Plan and states she is following her eating plan approximately 90% of the time. Caitlin Barrett states she is walking and doing Zumba for 30 to 45 minutes 2 times per week.  Today's visit was #: 23 Starting weight: 277 lbs Starting date: 08/13/2019 Today's weight: 254 lbs Today's date: 08/09/21 Total lbs lost to date: 23 lbs Total lbs lost since last in-office visit: -2  Interim History:  Started on Cymbalta 30 mg daily-2 weeks ago. She endorses improved mood.  She also schedule an appointment with behavioral health/psychology for August 2023.  Subjective:   1. Vitamin D deficiency Vitamin D level 05/03/21-50.5. Stable. She is on Ergocalciferol- denies mass in neck, dysphagia, dyspepsia, persistent hoarseness, abd pain, or GI upset.  2. Depression, recurrent ([place]) Started on Cymbalta 30 mg daily 2 weeks ago.   She experienced fatigue, headache, nausea the first week of SNRI therapy. She endorses improved mood, decreased diffuse pain. She denies SI/HI  Assessment/Plan:   1. Vitamin D deficiency Refill: - Vitamin D, Ergocalciferol, (DRISDOL) 1.25 MG (50000 UNIT) CAPS capsule; TAKE ONE CAPSULE BY MOUTH ONCE PER WEEK  Dispense: 4 capsule; Refill: 0  2. Depression, recurrent ([place]) Refill: - DULoxetine (CYMBALTA) 30 MG capsule; Take 1 capsule (30 mg total) by mouth daily.  Dispense: 30 capsule; Refill: 0  Consider increasing dose at next office visit.  3. Obesity, current BMI 37.5 Caitlin Barrett is currently in the action stage of change. As such, her goal is to continue with weight loss efforts. She has agreed to the Category 2 Plan.   Exercise goals: as is.  Check fasting labs at next office visit.  Behavioral modification strategies: increasing lean protein intake, decreasing simple carbohydrates,  meal planning and cooking strategies, keeping healthy foods in the home, and planning for success.  Caitlin Barrett has agreed to follow-up with our clinic in 2-3 weeks. She was informed of the importance of frequent follow-up visits to maximize her success with intensive lifestyle modifications for her multiple health conditions.   Objective:   Blood pressure 122/80, pulse 67, temperature 97.6 F (36.4 C), height 5' 9"  (1.753 m), weight 254 lb (115.2 kg), last menstrual period 05/26/2016, SpO2 98 %. Body mass index is 37.51 kg/m.  General: Cooperative, alert, well developed, in no acute distress. HEENT: Conjunctivae and lids unremarkable. Cardiovascular: Regular rhythm.  Lungs: Normal work of breathing. Neurologic: No focal deficits.   Lab Results  Component Value Date   CREATININE 0.78 05/03/2021   BUN 19 05/03/2021   NA 140 05/03/2021   K 4.3 05/03/2021   CL 100 05/03/2021   CO2 20 05/03/2021   Lab Results  Component Value Date   ALT 8 05/03/2021   AST 12 05/03/2021   ALKPHOS 54 05/03/2021   BILITOT <0.2 05/03/2021   Lab Results  Component Value Date   HGBA1C 5.3 05/03/2021   HGBA1C 5.3 12/29/2020   HGBA1C 5.3 03/31/2020   HGBA1C 5.5 12/15/2019   HGBA1C 5.3 08/13/2019   Lab Results  Component Value Date   INSULIN 17.5 05/03/2021   INSULIN 10.1 12/29/2020   INSULIN 8.9 06/09/2020   INSULIN 12.4 03/31/2020   INSULIN 13.5 12/15/2019   Lab Results  Component Value Date   TSH 0.081 (L) 08/13/2019   Lab Results  Component Value Date   CHOL 200 (H) 05/03/2021  HDL 42 05/03/2021   LDLCALC 137 (H) 05/03/2021   TRIG 117 05/03/2021   CHOLHDL 4.8 (H) 05/03/2021   Lab Results  Component Value Date   VD25OH 50.5 05/03/2021   VD25OH 35.9 12/29/2020   VD25OH 47.8 07/28/2020   Lab Results  Component Value Date   WBC 9.8 03/31/2020   HGB 14.2 03/31/2020   HCT 40.9 03/31/2020   MCV 88 03/31/2020   PLT 394 03/31/2020   Lab Results  Component Value Date   IRON 64  08/13/2019   TIBC 333 08/13/2019   FERRITIN 30 08/13/2019    Attestation Statements:   Reviewed by clinician on day of visit: allergies, medications, problem list, medical history, surgical history, family history, social history, and previous encounter notes.  I, Georgianne Fick, FNP, am acting as Location manager for Mina Marble, NP .  I have reviewed the above documentation for accuracy and completeness, and I agree with the above. -  Rebakah Cokley d. Sherra Kimmons, NP-C

## 2021-08-11 DIAGNOSIS — F32A Depression, unspecified: Secondary | ICD-10-CM | POA: Insufficient documentation

## 2021-08-11 DIAGNOSIS — F339 Major depressive disorder, recurrent, unspecified: Secondary | ICD-10-CM | POA: Insufficient documentation

## 2021-08-28 ENCOUNTER — Encounter (INDEPENDENT_AMBULATORY_CARE_PROVIDER_SITE_OTHER): Payer: Self-pay | Admitting: Adult Health

## 2021-08-28 ENCOUNTER — Ambulatory Visit (INDEPENDENT_AMBULATORY_CARE_PROVIDER_SITE_OTHER): Payer: No Typology Code available for payment source | Admitting: Adult Health

## 2021-08-28 VITALS — BP 122/77 | HR 75 | Temp 97.5°F | Ht 69.0 in | Wt 256.0 lb

## 2021-08-28 DIAGNOSIS — F3289 Other specified depressive episodes: Secondary | ICD-10-CM

## 2021-08-28 DIAGNOSIS — E8881 Metabolic syndrome: Secondary | ICD-10-CM

## 2021-08-28 DIAGNOSIS — Z9189 Other specified personal risk factors, not elsewhere classified: Secondary | ICD-10-CM

## 2021-08-28 DIAGNOSIS — Z6837 Body mass index (BMI) 37.0-37.9, adult: Secondary | ICD-10-CM

## 2021-08-28 DIAGNOSIS — E669 Obesity, unspecified: Secondary | ICD-10-CM | POA: Diagnosis not present

## 2021-08-28 DIAGNOSIS — E559 Vitamin D deficiency, unspecified: Secondary | ICD-10-CM | POA: Diagnosis not present

## 2021-08-28 DIAGNOSIS — F339 Major depressive disorder, recurrent, unspecified: Secondary | ICD-10-CM

## 2021-08-28 MED ORDER — DULOXETINE HCL 30 MG PO CPEP: 30.0000 mg | ORAL_CAPSULE | Freq: Every day | ORAL | 0 refills | Status: DC

## 2021-08-29 LAB — COMPREHENSIVE METABOLIC PANEL
ALT: 9 IU/L (ref 0–32)
AST: 11 IU/L (ref 0–40)
Albumin/Globulin Ratio: 1.9 (ref 1.2–2.2)
Albumin: 4.3 g/dL (ref 3.8–4.8)
Alkaline Phosphatase: 57 IU/L (ref 44–121)
BUN/Creatinine Ratio: 18 (ref 9–23)
BUN: 13 mg/dL (ref 6–24)
Bilirubin Total: 0.4 mg/dL (ref 0.0–1.2)
CO2: 25 mmol/L (ref 20–29)
Calcium: 9.3 mg/dL (ref 8.7–10.2)
Chloride: 102 mmol/L (ref 96–106)
Creatinine, Ser: 0.73 mg/dL (ref 0.57–1.00)
Globulin, Total: 2.3 g/dL (ref 1.5–4.5)
Glucose: 95 mg/dL (ref 70–99)
Potassium: 4.5 mmol/L (ref 3.5–5.2)
Sodium: 140 mmol/L (ref 134–144)
Total Protein: 6.6 g/dL (ref 6.0–8.5)
eGFR: 101 mL/min/{1.73_m2} (ref >59)

## 2021-08-29 LAB — INSULIN, RANDOM: INSULIN: 10.7 u[IU]/mL (ref 2.6–24.9)

## 2021-08-29 LAB — VITAMIN D 25 HYDROXY (VIT D DEFICIENCY, FRACTURES): Vit D, 25-Hydroxy: 34.9 ng/mL (ref 30.0–100.0)

## 2021-08-29 LAB — HEMOGLOBIN A1C
Est. average glucose Bld gHb Est-mCnc: 103 mg/dL
Hgb A1c MFr Bld: 5.2 % (ref 4.8–5.6)

## 2021-09-04 NOTE — Progress Notes (Signed)
Chief Complaint:   OBESITY Caitlin Barrett is here to discuss her progress with her obesity treatment plan along with follow-up of her obesity related diagnoses. Caitlin Barrett is on the Category 2 Plan and states she is following her eating plan approximately 85% of the time. Caitlin Barrett states she is walking for 30 minutes 3 times per week.  Today's visit was #: 33 Starting weight: 277 lbs Starting date: 08/13/2019 Today's weight: 256 lbs Today's date: 08/28/21 Total lbs lost to date: 21 lbs Total lbs lost since last in-office visit: +2  Interim History:  Since stopping metformin-lower extremity edema resolved. Discussed risk/benefits of GLP-1 therapy. She denies family hx of MTC or personal hx MENS 2 or pancreatitis.  Subjective:   1. Insulin resistance Her mother was recently diagnosed with T2D at age 27. Since stopping metformin-lower extremity edema resolved. She denies family hx of MTC or personal hx MENS 2 or pancreatitis. Discussed risk/benefits of GLP-1 therapy.  2. Vitamin D deficiency She is on ergocalciferol 50,000 IU-tolerating well.  3. Other Depression She reports pain level of 7/10 prior to starting Cymbalta therapy.  Pain level now with daily Cymbalta, rated  4/10. She reports pain in bilateral hips, shoulders, back. She reports improved mood. She denies SI/HI.  4. At risk for diabetes mellitus Related to insulin resistance, family history of type 2 diabetes (mother-recently diagnosed).  Assessment/Plan:   1. Insulin resistance Check labs. - Comprehensive metabolic panel - Hemoglobin A1c - Insulin, random  2. Vitamin D deficiency Check labs. - VITAMIN D 25 Hydroxy (Vit-D Deficiency, Fractures)  3. Other depression Refill - DULoxetine (CYMBALTA) 30 MG capsule; Take 1 capsule (30 mg total) by mouth daily.  Dispense: 90 capsule; Refill: 0  4. At risk for diabetes mellitus Caitlin Barrett was given approximately 15 minutes of diabetic education and counseling today.  We discussed intensive lifestyle modifications today with an emphasis on weight loss as well as increasing exercise and decreasing simple carbohydrates in her diet. We also reviewed medication options with an emphasis on risk versus benefits of those discussed.  Repetitive spaced learning was employed today to elicit superior memory formation and behavioral change.   5. Obesity, current BMI 37.9 Shaunda is currently in the action stage of change. As such, her goal is to continue with weight loss efforts. She has agreed to the Category 2 Plan.   Exercise goals: as is.  Behavioral modification strategies: increasing lean protein intake, decreasing simple carbohydrates, meal planning and cooking strategies, keeping healthy foods in the home, and planning for success.  Caitlin Barrett has agreed to follow-up with our clinic in 4 weeks. She was informed of the importance of frequent follow-up visits to maximize her success with intensive lifestyle modifications for her multiple health conditions.   Caitlin Barrett was informed we would discuss her lab results at her next visit unless there is a critical issue that needs to be addressed sooner. Caitlin Barrett agreed to keep her next visit at the agreed upon time to discuss these results.  Objective:   Blood pressure 122/77, pulse 75, temperature (!) 97.5 F (36.4 C), height 5' 9"  (1.753 m), weight 256 lb (116.1 kg), last menstrual period 05/26/2016, SpO2 97 %. Body mass index is 37.8 kg/m.  General: Cooperative, alert, well developed, in no acute distress. HEENT: Conjunctivae and lids unremarkable. Cardiovascular: Regular rhythm.  Lungs: Normal work of breathing. Neurologic: No focal deficits.   Lab Results  Component Value Date   CREATININE 0.73 08/28/2021   BUN 13 08/28/2021  NA 140 08/28/2021   K 4.5 08/28/2021   CL 102 08/28/2021   CO2 25 08/28/2021   Lab Results  Component Value Date   ALT 9 08/28/2021   AST 11 08/28/2021   ALKPHOS 57  08/28/2021   BILITOT 0.4 08/28/2021   Lab Results  Component Value Date   HGBA1C 5.2 08/28/2021   HGBA1C 5.3 05/03/2021   HGBA1C 5.3 12/29/2020   HGBA1C 5.3 03/31/2020   HGBA1C 5.5 12/15/2019   Lab Results  Component Value Date   INSULIN 10.7 08/28/2021   INSULIN 17.5 05/03/2021   INSULIN 10.1 12/29/2020   INSULIN 8.9 06/09/2020   INSULIN 12.4 03/31/2020   Lab Results  Component Value Date   TSH 0.081 (L) 08/13/2019   Lab Results  Component Value Date   CHOL 200 (H) 05/03/2021   HDL 42 05/03/2021   LDLCALC 137 (H) 05/03/2021   TRIG 117 05/03/2021   CHOLHDL 4.8 (H) 05/03/2021   Lab Results  Component Value Date   VD25OH 34.9 08/28/2021   VD25OH 50.5 05/03/2021   VD25OH 35.9 12/29/2020   Lab Results  Component Value Date   WBC 9.8 03/31/2020   HGB 14.2 03/31/2020   HCT 40.9 03/31/2020   MCV 88 03/31/2020   PLT 394 03/31/2020   Lab Results  Component Value Date   IRON 64 08/13/2019   TIBC 333 08/13/2019   FERRITIN 30 08/13/2019    Attestation Statements:   Reviewed by clinician on day of visit: allergies, medications, problem list, medical history, surgical history, family history, social history, and previous encounter notes.  I, Georgianne Fick, FNP, am acting as Location manager for Mina Marble, NP.  I have reviewed the above documentation for accuracy and completeness, and I agree with the above. -  Rashanna Christiana d. Truett Mcfarlan, NP-C

## 2021-09-21 ENCOUNTER — Ambulatory Visit (INDEPENDENT_AMBULATORY_CARE_PROVIDER_SITE_OTHER): Payer: No Typology Code available for payment source | Admitting: Adult Health

## 2021-09-21 ENCOUNTER — Encounter (INDEPENDENT_AMBULATORY_CARE_PROVIDER_SITE_OTHER): Payer: Self-pay | Admitting: Adult Health

## 2021-09-21 VITALS — BP 142/88 | HR 73 | Temp 97.5°F | Ht 69.0 in | Wt 259.0 lb

## 2021-09-21 DIAGNOSIS — E669 Obesity, unspecified: Secondary | ICD-10-CM

## 2021-09-21 DIAGNOSIS — F3289 Other specified depressive episodes: Secondary | ICD-10-CM

## 2021-09-21 DIAGNOSIS — Z6838 Body mass index (BMI) 38.0-38.9, adult: Secondary | ICD-10-CM

## 2021-09-21 DIAGNOSIS — Z9189 Other specified personal risk factors, not elsewhere classified: Secondary | ICD-10-CM

## 2021-09-21 DIAGNOSIS — E8881 Metabolic syndrome: Secondary | ICD-10-CM | POA: Diagnosis not present

## 2021-09-21 DIAGNOSIS — E559 Vitamin D deficiency, unspecified: Secondary | ICD-10-CM

## 2021-09-21 MED ORDER — DULOXETINE HCL 30 MG PO CPEP: 30.0000 mg | ORAL_CAPSULE | Freq: Every day | ORAL | 0 refills | Status: DC

## 2021-09-21 MED ORDER — VITAMIN D (ERGOCALCIFEROL) 1.25 MG (50000 UNIT) PO CAPS: ORAL_CAPSULE | ORAL | 0 refills | Status: DC

## 2021-09-21 NOTE — Progress Notes (Signed)
Chief Complaint:   OBESITY Caitlin Barrett is here to discuss her progress with her obesity treatment plan along with follow-up of her obesity related diagnoses. Caitlin Barrett is on the Category 2 Plan and states she is following her eating plan approximately 85% of the time. Caitlin Barrett states she is walking 30 minutes 3 times per week.  Today's visit was #: 22 Starting weight: 277 lbs Starting date: 08/13/2019 Today's weight: 259 lbs Today's date: 09/21/2021 Total lbs lost to date: 18 lbs Total lbs lost since last in-office visit: 0  Interim History:  Caitlin Barrett reports a typical day of food intake, as the following: Breakfast-3 eggs with 45 calorie toast.  Lunch- 4oz meat protein, yogurt bar- [place] bar.  Dinner- chicken, 2 vegetables (broccoli, green beans), yogurt bar-[employer] bar.   Subjective:   1. Vitamin D deficiency Labs discussed during visit today.  On 08/28/21 Caitlin Barrett's Vit D level-34.9 - subtherapeutic despite ergocalciferol once weekly. She has previously been on twice weekly Ergocalciferol- tolerated well.  2. Insulin resistance Labs discussed during visit today.  08/28/21 BG 95 A1c 5.2 Insulin Level 10.7 Metformin caused leg swelling, unable to tolerate. Caitlin Barrett is concerned about possible SE with GLP-1 and prefers to remain off. She has hx of hypothyroidism, multinodular goiter, Rathke's cleft cyst- followed by Endocrinology/Dr. Honor Junes.  3. Other depression Caitlin Barrett's mood is stable on Cymbalta 30 mg. Denies suicidal ideas, and homicidal ideas.  Her husband tells her that she "seems just better".  Current pain level 5/10.  4. At risk for osteoporosis Caitlin Barrett is at higher risk of osteopenia and osteoporosis due to Vitamin D deficiency.   Assessment/Plan:   1. Vitamin D deficiency Will check labs in 8 weeks.  We will increase/refill Ergocalciferol 50,000 IU twice a week for 1 month with 0 refills.  -Refill Vitamin D, Ergocalciferol, (DRISDOL) 1.25 MG (50000 UNIT)  CAPS capsule; TAKE ONE CAPSULE BY MOUTH TWICE PER WEEK  Dispense: 16 capsule; Refill: 0  2. Insulin resistance Inquire safety with GLP-1 therapy to your Endocrinologist/Dr. Honor Junes.  3. Other depression We will refill Cymbalta 30 mg daily for 3 months with 0 refills.  -Refill DULoxetine (CYMBALTA) 30 MG capsule; Take 1 capsule (30 mg total) by mouth daily.  Dispense: 90 capsule; Refill: 0  4. At risk for osteoporosis [name] was given approximately 15 minutes of osteoporosis prevention counseling today. [name] is at risk for osteopenia and osteoporosis due to her Vitamin D deficiency. She was encouraged to take her Vit D and follow her higher calcium diet and increase strengthening exercise to help strengthen her bones and decrease her risk of osteopenia and osteoporosis.  5. Obesity, current BMI 38.2 Caitlin Barrett is currently in the action stage of change. As such, her goal is to continue with weight loss efforts. She has agreed to the Category 2 Plan.   Caitlin Barrett will use food scale. IC at next visit. She is aware to arrive 30 minutes early.  Exercise goals:   Behavioral modification strategies: increasing lean protein intake, decreasing simple carbohydrates, meal planning and cooking strategies, keeping healthy foods in the home, and planning for success.  Caitlin Barrett has agreed to follow-up with our clinic in 4 weeks. She was informed of the importance of frequent follow-up visits to maximize her success with intensive lifestyle modifications for her multiple health conditions.   Objective:   Blood pressure (!) 142/88, pulse 73, temperature (!) 97.5 F (36.4 C), height 5' 9"  (1.753 m), weight 259 lb (117.5 kg), last menstrual period 05/26/2016, SpO2 98 %.  Body mass index is 38.25 kg/m.  General: Cooperative, alert, well developed, in no acute distress. HEENT: Conjunctivae and lids unremarkable. Cardiovascular: Regular rhythm.  Lungs: Normal work of breathing. Neurologic: No focal  deficits.   Lab Results  Component Value Date   CREATININE 0.73 08/28/2021   BUN 13 08/28/2021   NA 140 08/28/2021   K 4.5 08/28/2021   CL 102 08/28/2021   CO2 25 08/28/2021   Lab Results  Component Value Date   ALT 9 08/28/2021   AST 11 08/28/2021   ALKPHOS 57 08/28/2021   BILITOT 0.4 08/28/2021   Lab Results  Component Value Date   HGBA1C 5.2 08/28/2021   HGBA1C 5.3 05/03/2021   HGBA1C 5.3 12/29/2020   HGBA1C 5.3 03/31/2020   HGBA1C 5.5 12/15/2019   Lab Results  Component Value Date   INSULIN 10.7 08/28/2021   INSULIN 17.5 05/03/2021   INSULIN 10.1 12/29/2020   INSULIN 8.9 06/09/2020   INSULIN 12.4 03/31/2020   Lab Results  Component Value Date   TSH 0.081 (L) 08/13/2019   Lab Results  Component Value Date   CHOL 200 (H) 05/03/2021   HDL 42 05/03/2021   LDLCALC 137 (H) 05/03/2021   TRIG 117 05/03/2021   CHOLHDL 4.8 (H) 05/03/2021   Lab Results  Component Value Date   VD25OH 34.9 08/28/2021   VD25OH 50.5 05/03/2021   VD25OH 35.9 12/29/2020   Lab Results  Component Value Date   WBC 9.8 03/31/2020   HGB 14.2 03/31/2020   HCT 40.9 03/31/2020   MCV 88 03/31/2020   PLT 394 03/31/2020   Lab Results  Component Value Date   IRON 64 08/13/2019   TIBC 333 08/13/2019   FERRITIN 30 08/13/2019   Attestation Statements:   Reviewed by clinician on day of visit: allergies, medications, problem list, medical history, surgical history, family history, social history, and previous encounter notes.  I, Brendell Tyus, RMA, am acting as transcriptionist for Mina Marble, NP.  I have reviewed the above documentation for accuracy and completeness, and I agree with the above. - Adamary Savary d. Petrea Fredenburg, NP-C

## 2021-09-23 ENCOUNTER — Encounter (INDEPENDENT_AMBULATORY_CARE_PROVIDER_SITE_OTHER): Payer: Self-pay | Admitting: Adult Health

## 2021-09-24 ENCOUNTER — Encounter (INDEPENDENT_AMBULATORY_CARE_PROVIDER_SITE_OTHER): Payer: Self-pay | Admitting: Adult Health

## 2021-10-16 ENCOUNTER — Encounter (INDEPENDENT_AMBULATORY_CARE_PROVIDER_SITE_OTHER): Payer: Self-pay | Admitting: Adult Health

## 2021-10-16 ENCOUNTER — Ambulatory Visit (INDEPENDENT_AMBULATORY_CARE_PROVIDER_SITE_OTHER): Payer: No Typology Code available for payment source | Admitting: Adult Health

## 2021-10-16 VITALS — BP 125/81 | HR 74 | Temp 97.6°F | Ht 69.0 in | Wt 256.0 lb

## 2021-10-16 DIAGNOSIS — E559 Vitamin D deficiency, unspecified: Secondary | ICD-10-CM | POA: Diagnosis not present

## 2021-10-16 DIAGNOSIS — Z9189 Other specified personal risk factors, not elsewhere classified: Secondary | ICD-10-CM

## 2021-10-16 DIAGNOSIS — E669 Obesity, unspecified: Secondary | ICD-10-CM | POA: Diagnosis not present

## 2021-10-16 DIAGNOSIS — E8881 Metabolic syndrome: Secondary | ICD-10-CM | POA: Diagnosis not present

## 2021-10-16 DIAGNOSIS — F3289 Other specified depressive episodes: Secondary | ICD-10-CM | POA: Diagnosis not present

## 2021-10-16 DIAGNOSIS — Z6837 Body mass index (BMI) 37.0-37.9, adult: Secondary | ICD-10-CM

## 2021-10-16 DIAGNOSIS — E88819 Insulin resistance, unspecified: Secondary | ICD-10-CM

## 2021-10-16 DIAGNOSIS — E66813 Obesity, class 3: Secondary | ICD-10-CM

## 2021-10-16 MED ORDER — VITAMIN D (ERGOCALCIFEROL) 1.25 MG (50000 UNIT) PO CAPS: ORAL_CAPSULE | ORAL | 0 refills | Status: DC

## 2021-10-18 ENCOUNTER — Encounter: Payer: Self-pay | Admitting: Obstetrics and Gynecology

## 2021-10-18 ENCOUNTER — Other Ambulatory Visit: Payer: Self-pay | Admitting: Obstetrics and Gynecology

## 2021-10-18 NOTE — Progress Notes (Unsigned)
Chief Complaint:   OBESITY Caitlin Barrett is here to discuss her progress with her obesity treatment plan along with follow-up of her obesity related diagnoses. Caitlin Barrett is on {MWMwtlossportion/plan2:23431} and states she is following her eating plan approximately ***% of the time. Caitlin Barrett states she is *** *** minutes *** times per week.  Today's visit was #: *** Starting weight: *** Starting date: *** Today's weight: *** Today's date: 10/16/2021 Total lbs lost to date: *** Total lbs lost since last in-office visit: ***  Interim History: ***  Subjective:   1. Vitamin D deficiency ***  2. Insulin resistance ***  3. Other depression, with emotional eating ***  4. At risk for osteoporosis ***  Assessment/Plan:   1. Vitamin D deficiency *** - Vitamin D, Ergocalciferol, (DRISDOL) 1.25 MG (50000 UNIT) CAPS capsule; TAKE ONE CAPSULE BY MOUTH TWICE PER WEEK  Dispense: 16 capsule; Refill: 0  2. Insulin resistance ***  3. Other depression, with emotional eating ***  4. At risk for osteoporosis ***  5. Obesity, current BMI 37.8 Caitlin Barrett {CHL AMB IS/IS NOT:210130109} currently in the action stage of change. As such, her goal is to {MWMwtloss#1:210800005}. She has agreed to {MWMwtlossportion/plan2:23431}.   Exercise goals: {MWM EXERCISE RECS:23473}  Behavioral modification strategies: {MWMwtlossdietstrategies3:23432}.  [name] has agreed to follow-up with our clinic in {NUMBER 1-10:22536} weeks. She was informed of the importance of frequent follow-up visits to maximize her success with intensive lifestyle modifications for her multiple health conditions.   Objective:   Pulse 74, temperature 97.6 F (36.4 C), height '5\' 9"'$  (1.753 m), weight 256 lb (116.1 kg), last menstrual period 05/26/2016, SpO2 97 %. Body mass index is 37.8 kg/m.  General: Cooperative, alert, well developed, in no acute distress. HEENT: Conjunctivae and lids unremarkable. Cardiovascular: Regular  rhythm.  Lungs: Normal work of breathing. Neurologic: No focal deficits.   Lab Results  Component Value Date   CREATININE 0.73 08/28/2021   BUN 13 08/28/2021   NA 140 08/28/2021   K 4.5 08/28/2021   CL 102 08/28/2021   CO2 25 08/28/2021   Lab Results  Component Value Date   ALT 9 08/28/2021   AST 11 08/28/2021   ALKPHOS 57 08/28/2021   BILITOT 0.4 08/28/2021   Lab Results  Component Value Date   HGBA1C 5.2 08/28/2021   HGBA1C 5.3 05/03/2021   HGBA1C 5.3 12/29/2020   HGBA1C 5.3 03/31/2020   HGBA1C 5.5 12/15/2019   Lab Results  Component Value Date   INSULIN 10.7 08/28/2021   INSULIN 17.5 05/03/2021   INSULIN 10.1 12/29/2020   INSULIN 8.9 06/09/2020   INSULIN 12.4 03/31/2020   Lab Results  Component Value Date   TSH 0.081 (L) 08/13/2019   Lab Results  Component Value Date   CHOL 200 (H) 05/03/2021   HDL 42 05/03/2021   LDLCALC 137 (H) 05/03/2021   TRIG 117 05/03/2021   CHOLHDL 4.8 (H) 05/03/2021   Lab Results  Component Value Date   VD25OH 34.9 08/28/2021   VD25OH 50.5 05/03/2021   VD25OH 35.9 12/29/2020   Lab Results  Component Value Date   WBC 9.8 03/31/2020   HGB 14.2 03/31/2020   HCT 40.9 03/31/2020   MCV 88 03/31/2020   PLT 394 03/31/2020   Lab Results  Component Value Date   IRON 64 08/13/2019   TIBC 333 08/13/2019   FERRITIN 30 08/13/2019   Attestation Statements:   Reviewed by clinician on day of visit: allergies, medications, problem list, medical history, surgical history, family  history, social history, and previous encounter notes.  I, ***, am acting as transcriptionist for ***.  I have reviewed the above documentation for accuracy and completeness, and I agree with the above. -  ***

## 2021-10-19 ENCOUNTER — Other Ambulatory Visit: Payer: Self-pay | Admitting: Family Medicine

## 2021-10-19 DIAGNOSIS — M542 Cervicalgia: Secondary | ICD-10-CM

## 2021-10-19 DIAGNOSIS — M545 Low back pain, unspecified: Secondary | ICD-10-CM

## 2021-10-19 DIAGNOSIS — M25511 Pain in right shoulder: Secondary | ICD-10-CM

## 2021-10-19 MED ORDER — PROGESTERONE 200 MG PO CAPS: ORAL_CAPSULE | ORAL | 3 refills | Status: DC

## 2021-10-20 NOTE — Telephone Encounter (Signed)
Requested medications are due for refill today.  yes  Requested medications are on the active medications list.  yes  Last refill. 07/18/2021 330 2 refills  Future visit scheduled.   no  Notes to clinic.  Refill not delegated    Requested Prescriptions  Pending Prescriptions Disp Refills   cyclobenzaprine (FLEXERIL) 10 MG tablet [Pharmacy Med Name: CYCLOBENZAPRINE 10 MG TABLET] 30 tablet 2    Sig: TAKE 1 TABLET BY MOUTH AT BEDTIME AS NEEDED FOR MUSCLE SPASMS.     Not Delegated - Analgesics:  Muscle Relaxants Failed - 10/19/2021 12:53 PM      Failed - This refill cannot be delegated      Failed - Valid encounter within last 6 months    Recent Outpatient Visits           1 year ago Chronic right hip pain   [place], DO   1 year ago Acute right-sided low back pain without sciatica   [place], DO   1 year ago Atypical chest pain   [facility] Olin Hauser, DO   2 years ago [place], DO   2 years ago Fatigue, unspecified type   [place], Devonne Doughty, DO       Future Appointments             In 2 months [place] Sky[place] Blinda [facility]   In 9 months Rubie Maid, MD [facility]

## 2021-10-30 ENCOUNTER — Ambulatory Visit (INDEPENDENT_AMBULATORY_CARE_PROVIDER_SITE_OTHER): Payer: No Typology Code available for payment source | Admitting: Clinical

## 2021-10-30 DIAGNOSIS — F331 Major depressive disorder, recurrent, moderate: Secondary | ICD-10-CM | POA: Diagnosis not present
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
## 2021-11-01 ENCOUNTER — Encounter (INDEPENDENT_AMBULATORY_CARE_PROVIDER_SITE_OTHER): Payer: Self-pay

## 2021-11-10 ENCOUNTER — Other Ambulatory Visit (INDEPENDENT_AMBULATORY_CARE_PROVIDER_SITE_OTHER): Payer: Self-pay | Admitting: Adult Health

## 2021-11-10 DIAGNOSIS — E559 Vitamin D deficiency, unspecified: Secondary | ICD-10-CM

## 2021-11-21 ENCOUNTER — Ambulatory Visit (INDEPENDENT_AMBULATORY_CARE_PROVIDER_SITE_OTHER): Payer: No Typology Code available for payment source | Admitting: Adult Health

## 2021-11-21 ENCOUNTER — Encounter (INDEPENDENT_AMBULATORY_CARE_PROVIDER_SITE_OTHER): Payer: Self-pay | Admitting: Adult Health

## 2021-11-21 VITALS — BP 125/83 | HR 75 | Temp 97.6°F | Ht 69.0 in | Wt 261.0 lb

## 2021-11-21 DIAGNOSIS — E559 Vitamin D deficiency, unspecified: Secondary | ICD-10-CM | POA: Diagnosis not present

## 2021-11-21 DIAGNOSIS — F3289 Other specified depressive episodes: Secondary | ICD-10-CM | POA: Diagnosis not present

## 2021-11-21 DIAGNOSIS — E669 Obesity, unspecified: Secondary | ICD-10-CM | POA: Diagnosis not present

## 2021-11-21 DIAGNOSIS — Z6838 Body mass index (BMI) 38.0-38.9, adult: Secondary | ICD-10-CM | POA: Diagnosis not present

## 2021-11-21 DIAGNOSIS — Z9189 Other specified personal risk factors, not elsewhere classified: Secondary | ICD-10-CM

## 2021-11-21 MED ORDER — DULOXETINE HCL 30 MG PO CPEP: 30.0000 mg | ORAL_CAPSULE | Freq: Every day | ORAL | 0 refills | Status: DC

## 2021-11-22 ENCOUNTER — Other Ambulatory Visit (INDEPENDENT_AMBULATORY_CARE_PROVIDER_SITE_OTHER): Payer: Self-pay | Admitting: Adult Health

## 2021-11-22 ENCOUNTER — Encounter (INDEPENDENT_AMBULATORY_CARE_PROVIDER_SITE_OTHER): Payer: Self-pay | Admitting: Adult Health

## 2021-11-22 DIAGNOSIS — E559 Vitamin D deficiency, unspecified: Secondary | ICD-10-CM

## 2021-11-22 LAB — VITAMIN D 25 HYDROXY (VIT D DEFICIENCY, FRACTURES): Vit D, 25-Hydroxy: 34.6 ng/mL (ref 30.0–100.0)

## 2021-11-22 MED ORDER — VITAMIN D (ERGOCALCIFEROL) 1.25 MG (50000 UNIT) PO CAPS: ORAL_CAPSULE | ORAL | 0 refills | Status: DC

## 2021-11-23 ENCOUNTER — Ambulatory Visit: Payer: No Typology Code available for payment source | Admitting: Clinical

## 2021-11-23 DIAGNOSIS — F331 Major depressive disorder, recurrent, moderate: Secondary | ICD-10-CM
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
## 2021-11-25 NOTE — Progress Notes (Unsigned)
Chief Complaint:   OBESITY Caitlin Barrett is here to discuss her progress with her obesity treatment plan along with follow-up of her obesity related diagnoses. Caitlin Barrett is on the Category 2 Plan and states she is following her eating plan approximately 85% of the time. Caitlin Barrett states she is walking 30-40 minutes 3 times per week.  Today's visit was #: 19 Starting weight: 277 lbs Starting date: 08/13/2019 Today's weight: 256 lbs Today's date: 11/21/2021 Total lbs lost to date: 21 lbs Total lbs lost since last in-office visit: +5 lbs  Interim History: When work schedule picks up, she has less time to eat all prescribed foods on the plan.  Due to excessive heat she increased drinking electrolyte replacements like Gatorades and body armour.   Subjective:   1. Vitamin D deficiency 09/21/21, Ergocalciferol increased from weekly to twice weekly.  She endorses stable energy level.   2. Other depression, with emotional eating She reports stable mood denies suicidal or homicidal ideations. She reports increased joint pain relate to dog walking.   3. At risk for osteoporosis Caitlin Barrett is at higher risk of osteopenia and osteoporosis due to Vitamin D deficiency and obesity.   Assessment/Plan:   1. Vitamin D deficiency Check labs then my chart patient after results and refill ergocalciferol as appropriate.  - VITAMIN D 25 Hydroxy (Vit-D Deficiency, Fractures)  2. Other depression, with emotional eating Refill - DULoxetine (CYMBALTA) 30 MG capsule; Take 1 capsule (30 mg total) by mouth daily.  Dispense: 90 capsule; Refill: 0  3. At risk for osteoporosis Caitlin Barrett was given approximately 15 minutes of osteoporosis prevention counseling today. Caitlin Barrett is at risk for osteopenia and osteoporosis due to her Vitamin D deficiency. She was encouraged to take her Vit D and follow her higher calcium diet and increase strengthening exercise to help strengthen her bones and decrease her risk of  osteopenia and osteoporosis.   4. Obesity, current BMI 38.5 Caitlin Barrett is currently in the action stage of change. As such, her goal is to continue with weight loss efforts. She has agreed to the Category 2 Plan.   Exercise goals:  as is.   Behavioral modification strategies: increasing lean protein intake, decreasing simple carbohydrates, meal planning and cooking strategies, keeping healthy foods in the home, better snacking choices, and planning for success.  Caitlin Barrett has agreed to follow-up with our clinic in 4 weeks. She was informed of the importance of frequent follow-up visits to maximize her success with intensive lifestyle modifications for her multiple health conditions.   Objective:   Blood pressure 125/83, pulse 75, temperature 97.6 F (36.4 C), height '5\' 9"'$  (1.753 m), weight 261 lb (118.4 kg), last menstrual period 05/26/2016, SpO2 96 %. Body mass index is 38.54 kg/m.  General: Cooperative, alert, well developed, in no acute distress. HEENT: Conjunctivae and lids unremarkable. Cardiovascular: Regular rhythm.  Lungs: Normal work of breathing. Neurologic: No focal deficits.   Lab Results  Component Value Date   CREATININE 0.73 08/28/2021   BUN 13 08/28/2021   NA 140 08/28/2021   K 4.5 08/28/2021   CL 102 08/28/2021   CO2 25 08/28/2021   Lab Results  Component Value Date   ALT 9 08/28/2021   AST 11 08/28/2021   ALKPHOS 57 08/28/2021   BILITOT 0.4 08/28/2021   Lab Results  Component Value Date   HGBA1C 5.2 08/28/2021   HGBA1C 5.3 05/03/2021   HGBA1C 5.3 12/29/2020   HGBA1C 5.3 03/31/2020   HGBA1C 5.5 12/15/2019   Lab  Results  Component Value Date   INSULIN 10.7 08/28/2021   INSULIN 17.5 05/03/2021   INSULIN 10.1 12/29/2020   INSULIN 8.9 06/09/2020   INSULIN 12.4 03/31/2020   Lab Results  Component Value Date   TSH 0.081 (L) 08/13/2019   Lab Results  Component Value Date   CHOL 200 (H) 05/03/2021   HDL 42 05/03/2021   LDLCALC 137 (H) 05/03/2021    TRIG 117 05/03/2021   CHOLHDL 4.8 (H) 05/03/2021   Lab Results  Component Value Date   VD25OH 34.6 11/21/2021   VD25OH 34.9 08/28/2021   VD25OH 50.5 05/03/2021   Lab Results  Component Value Date   WBC 9.8 03/31/2020   HGB 14.2 03/31/2020   HCT 40.9 03/31/2020   MCV 88 03/31/2020   PLT 394 03/31/2020   Lab Results  Component Value Date   IRON 64 08/13/2019   TIBC 333 08/13/2019   FERRITIN 30 08/13/2019   Attestation Statements:   Reviewed by clinician on day of visit: allergies, medications, problem list, medical history, surgical history, family history, social history, and previous encounter notes.  I, Davy Pique, RMA, am acting as Location manager for Mina Marble, NP.  I have reviewed the above documentation for accuracy and completeness, and I agree with the above. -  ***

## 2021-12-07 ENCOUNTER — Ambulatory Visit: Payer: No Typology Code available for payment source | Admitting: Clinical

## 2021-12-07 DIAGNOSIS — F331 Major depressive disorder, recurrent, moderate: Secondary | ICD-10-CM
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
## 2021-12-10 NOTE — Progress Notes (Unsigned)
Office Visit Note  Patient: Caitlin Barrett             Date of Birth: 01-Dec-1970           MRN: [id number]             PCP: Smitty Cords, DO Referring: Saralyn Pilar * Visit Date: 12/21/2021 Occupation: @GUAROCC @  Subjective:  Pain in both hips and both knees  History of Present Illness: TRESHA VALDESPINO is a 51 y.o. female with history of myofascial pain and osteoarthritis.  Patient presents today with ongoing pain in both hips and both knee joints.  She states that she has noticed increased discomfort and stiffness especially after sitting for prolonged periods of time.  She states that her mobility has become more limited due to the discomfort and stiffness.  She also has been having pain at night especially when lying on her sides due to the discomfort in both hips.  She tried physical therapy and has continued home exercises but continues to have persistent discomfort.  She states that her knee joint pain is most severe when climbing steps.  She is no longer taking Celebrex but has been taking Advil very frequently to manage her symptoms.  She is also been taking turmeric for the anti-inflammatory properties with minimal improvement in her symptoms.  She denies any joint swelling at this time.  She was started on low-dose Cymbalta 30 mg 1 capsule daily by her PCP a couple of months ago which initially seemed to help with her myofascial pain.  She continues to take Flexeril as needed for muscle spasms.    Activities of Daily Living:  Patient reports morning stiffness for 30 minutes.   Patient Reports nocturnal pain.  Difficulty dressing/grooming: Denies Difficulty climbing stairs: Reports Difficulty getting out of chair: Reports Difficulty using hands for taps, buttons, cutlery, and/or writing: Denies  Review of Systems  Constitutional:  Positive for fatigue.  HENT:  Positive for mouth dryness. Negative for mouth sores.   Eyes:  Positive for dryness.   Respiratory:  Positive for shortness of breath.   Cardiovascular:  Positive for swelling in legs/feet. Negative for chest pain and palpitations.  Gastrointestinal:  Positive for constipation. Negative for blood in stool and diarrhea.  Endocrine: Negative for increased urination.  Genitourinary:  Negative for involuntary urination.  Musculoskeletal:  Positive for joint pain, gait problem, joint pain, myalgias, morning stiffness, muscle tenderness and myalgias. Negative for joint swelling and muscle weakness.  Skin:  Negative for color change, rash, hair loss and sensitivity to sunlight.  Allergic/Immunologic: Positive for susceptible to infections.  Neurological:  Positive for headaches. Negative for dizziness.  Hematological:  Positive for swollen glands.  Psychiatric/Behavioral:  Positive for depressed mood and sleep disturbance. The patient is nervous/anxious.     PMFS History:  Patient Active Problem List   Diagnosis Date Noted   Depression 08/11/2021   Bilateral lower extremity edema 08/06/2021   Status post nasal septoplasty 03/30/2021   Colon cancer screening    Polyp of colon    Depressed mood 12/01/2020   Nausea without vomiting 11/07/2020   Upper respiratory tract infection 11/07/2020   Thyroid disease 06/09/2020   Cervical strain 04/25/2020   Fatigue 04/04/2020   GAD (generalized anxiety disorder) 02/08/2020   Pericardial pain 01/28/2020   Muscle soreness 01/28/2020   Mixed hyperlipidemia 11/25/2019   Elevated BP without diagnosis of hypertension 11/25/2019   Polyphagia 10/08/2019   Vitamin D deficiency 09/17/2019   Insulin resistance  09/17/2019   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   Class 3 severe obesity with serious comorbidity and body mass index (BMI) of 40.0 to 44.9 in adult [facility]) 09/17/2019   Exercise-induced asthma 04/22/2018   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  Takes allergy shots at [facility]   Anemia    Anxiety    Arthritis    Asthma    exercise induced--no inhalers   Back pain    Cancer (HCC) 2010   Basal cell skin cancer Lt eye, skin graft above Lt eye   Constipation    Costochondritis    Depression    Dysmenorrhea    Eczema    Edema, lower extremity    GERD (gastroesophageal reflux disease)    Headache    Hypothyroid    Nodules and Fine needle aspiration   IBS (irritable bowel syndrome)    Lab test positive for detection of COVID-19 virus    November 2020   Myalgia    Osteoarthritis    Painful menstrual periods    Palpitations    Plantar fasciitis    Shortness of breath    Sprain of carpal (joint) of wrist    Vitamin B 12 deficiency    Vitamin D deficiency     Family History  Problem Relation Age of Onset   Diabetes Mother    Hyperlipidemia Mother    Hypertension Mother    Sleep apnea Mother    Obesity Mother    Cancer Father        lung CA   Heart disease Maternal Grandmother    Stroke Maternal Grandmother    Hypertension Maternal Grandmother    Diabetes Maternal Grandmother    Heart disease Maternal Grandfather    Diabetes Maternal Grandfather    Heart attack Maternal Grandfather    Past Surgical History:  Procedure Laterality Date   ABDOMINAL HYSTERECTOMY Bilateral 07/02/2016   Procedure: HYSTERECTOMY ABDOMINAL WITH BILATERAL SALPINGECTOMY-MIDLINE INCISION;  Surgeon: Herold Harms, MD;  Location: [facility] ORS;  Service: Gynecology;  Laterality: Bilateral;   CHOLECYSTECTOMY  2009    COLONOSCOPY WITH PROPOFOL N/A 01/26/2021   Procedure: COLONOSCOPY WITH PROPOFOL;  Surgeon: Toney Reil, MD;  Location: [facility];  Service: Gastroenterology;  Laterality: N/A;   fibromalga     HERNIA REPAIR  1974   umbilical   NASAL SEPTOPLASTY W/ TURBINOPLASTY Bilateral 03/03/2021   Procedure: NASAL SEPTOPLASTY WITH SUBMUCOSAL RESECTION OF TURBINATE;  Surgeon: Linus Salmons, MD;  Location: [facility];  Service: ENT;  Laterality: Bilateral;   SKIN GRAFT  2010   TONSILLECTOMY     Social History   Social History Narrative   Not on file   Immunization History  Administered Date(s) Administered   Influenza Inj Mdck Quad Pf 01/29/2019   Influenza Nasal 01/16/2018   Influenza-Unspecified 01/25/2020   Tdap 06/14/2017     Objective: Vital Signs: BP 135/85 (BP Location: Left Arm, Patient Position: Sitting, Cuff Size: Large)   Pulse 84   Resp 17  Ht 5\' 8"  (1.727 m)   Wt 264 lb 6.4 oz (119.9 kg)   LMP 05/26/2016 (Exact Date)   BMI 40.20 kg/m    Physical Exam Vitals and nursing note reviewed.  Constitutional:      Appearance: She is well-developed.  HENT:     Head: Normocephalic and atraumatic.  Eyes:     Conjunctiva/sclera: Conjunctivae normal.  Cardiovascular:     Rate and Rhythm: Normal rate and regular rhythm.     Heart sounds: Normal heart sounds.  Pulmonary:     Effort: Pulmonary effort is normal.     Breath sounds: Normal breath sounds.  Abdominal:     General: Bowel sounds are normal.     Palpations: Abdomen is soft.  Musculoskeletal:     Cervical back: Normal range of motion.  Lymphadenopathy:     Cervical: No cervical adenopathy.  Skin:    General: Skin is warm and dry.     Capillary Refill: Capillary refill takes less than 2 seconds.  Neurological:     Mental Status: She is alert and oriented to person, place, and time.  Psychiatric:        Behavior: Behavior normal.      Musculoskeletal Exam: C-spine, thoracic spine, lumbar spine  have good range of motion.  No midline spinal tenderness or SI joint tenderness.  Shoulder joints, elbow joints, wrist joints, MCPs, PIPs, DIPs have good range of motion with no synovitis.  Complete fist formation noted bilaterally.  Hip joints have good range of motion with no groin pain.  Tenderness palpation over bilateral trochanteric bursa.  Knee joints have good range of motion with some discomfort and stiffness bilaterally.  Mild knee crepitus noted bilaterally.  No warmth or effusion of knee joints noted.  Ankle joints have good range of motion with no tenderness or synovitis.  CDAI Exam: CDAI Score: -- Patient Global: --; Provider Global: -- Swollen: --; Tender: -- Joint Exam 12/21/2021   No joint exam has been documented for this visit   There is currently no information documented on the homunculus. Go to the Rheumatology activity and complete the homunculus joint exam.  Investigation: No additional findings.  Imaging: No results found.  Recent Labs: Lab Results  Component Value Date   WBC 9.8 03/31/2020   HGB 14.2 03/31/2020   PLT 394 03/31/2020   NA 140 08/28/2021   K 4.5 08/28/2021   CL 102 08/28/2021   CO2 25 08/28/2021   GLUCOSE 95 08/28/2021   BUN 13 08/28/2021   CREATININE 0.73 08/28/2021   BILITOT 0.4 08/28/2021   ALKPHOS 57 08/28/2021   AST 11 08/28/2021   ALT 9 08/28/2021   PROT 6.6 08/28/2021   ALBUMIN 4.3 08/28/2021   CALCIUM 9.3 08/28/2021   GFRAA 89 03/31/2020    Speciality Comments: No specialty comments available.  Procedures:  No procedures performed Allergies: Iodinated contrast media and Codeine   Assessment / Plan:     Visit Diagnoses: Myofascial pain: Patient continues to experience intermittent myalgias and muscle tenderness consistent with myofascial pain syndrome.  She is currently on Cymbalta 30 mg 1 capsule by mouth daily and takes Flexeril 10 mg at bedtime as needed for muscle spasms.  She initially noticed some improvement with  her generalized myalgias and arthralgias after initiating Cymbalta but recently her symptoms seem to be worsening.  She plans on reaching out to her PCP to discuss increasing the dose of Cymbalta to 60 mg daily. Different treatment options for myofascial pain syndrome  were discussed today in detail.  Discussed the importance of regular exercise and good sleep hygiene.  I also recommended water therapy or water aerobics.  I also offered a referral to integrative therapies but she plans on finding a physical therapist closer to her home in [place] and will notify us when and if she would like a referral to be placed.  She may benefit from dry needling, ultrasound, or massage in the future.  Polyarthralgia: Autoimmune work-up negative 04/28/2019.  No synovitis was noted on examination today.  She continues to experience intermittent arthralgias and joint stiffness especially in both hips and both knee joints.  She has noticed more limitation with her mobility due to the pain and discomfort.  She has not noticed any joint swelling.  No signs of inflammatory arthritis were noted on examination today.  If she develops increased joint pain or joint swelling I recommend updating autoimmune lab work in the future.  Chronic right shoulder pain: She has good range of motion of the right shoulder joint on examination today with no discomfort or tenderness.  Trochanteric bursitis of both hips: She presents today with ongoing discomfort on the lateral aspect of both hips consistent with trochanteric bursitis.  Her symptoms are exacerbated by lying on her sides at night.  On examination she has good range of motion of both hip joints with no groin pain.  She tried physical therapy which alleviated her symptoms to an extent but overall her symptoms have persisted.  She has been performing home exercises with minimal improvement.  I offered referral to integrative therapies but she would like to find a physical therapy place  closer to home and will notify us when and if she would like a referral to be placed.  I also discussed other treatment options including dry needling as well as cortisone injections if her symptoms persist or worsen.  Chronic SI joint pain: No SI joint tenderness upon palpation today.  Primary osteoarthritis of left knee - - Moderate osteoarthritis and moderate chondromalacia patella.  Evaluated at [facility] in the past.  She presents today with increased pain and stiffness in both knee joints.  Her discomfort has been persistent despite taking Advil on a daily basis for pain relief.  On examination she has good range of motion of both knee joints.  No warmth or effusion noted.  She has stiffness and some crepitus noted bilaterally.  Patient declined updated x-rays of her knees at this time.  Different treatment options were discussed today in detail.  I reviewed the list of natural anti-inflammatories today in detail and all questions were addressed.  She is already taking turmeric but plans on adding tart cherry, ginger, and omega-3. Discussed proceeding with a cortisone injection versus Visco gel injections in the future.  She would like to hold off on having an injection at this time.  Discussed the importance of lower extremity muscle strengthening to increase her mobility.  Offered a referral to aquatic therapy but she would like to hold off at this time.  She will notify us if her symptoms persist or worsen.  As discussed she plans on adding natural anti-inflammatories to her current treatment regimen.  Other medical conditions are listed as follows:  Morton's neuroma of right foot  Basal cell carcinoma (BCC) of skin of other part of face  Exercise-induced asthma  Anxiety with depression  Central hypothyroidism  History of IBS  Family history of psoriasis in sister  Orders: No orders of the defined  types were placed in this encounter.  No orders of the defined types were  placed in this encounter.    Follow-Up Instructions: Return in 6 months (on 06/21/2022) for Myofascial pain, Osteoarthritis.   Gearldine Bienenstock, PA-C  Note - This record has been created using Dragon software.  Chart creation errors have been sought, but may not always  have been located. Such creation errors do not reflect on  the standard of medical care.

## 2021-12-11 ENCOUNTER — Other Ambulatory Visit: Payer: Self-pay | Admitting: Obstetrics and Gynecology

## 2021-12-19 ENCOUNTER — Encounter (INDEPENDENT_AMBULATORY_CARE_PROVIDER_SITE_OTHER): Payer: Self-pay | Admitting: Adult Health

## 2021-12-19 ENCOUNTER — Ambulatory Visit (INDEPENDENT_AMBULATORY_CARE_PROVIDER_SITE_OTHER): Payer: No Typology Code available for payment source | Admitting: Adult Health

## 2021-12-19 VITALS — BP 115/77 | HR 69 | Temp 97.6°F | Ht 69.0 in | Wt 262.0 lb

## 2021-12-19 DIAGNOSIS — Z6838 Body mass index (BMI) 38.0-38.9, adult: Secondary | ICD-10-CM | POA: Diagnosis not present

## 2021-12-19 DIAGNOSIS — E669 Obesity, unspecified: Secondary | ICD-10-CM | POA: Diagnosis not present

## 2021-12-19 DIAGNOSIS — F3289 Other specified depressive episodes: Secondary | ICD-10-CM | POA: Diagnosis not present

## 2021-12-19 DIAGNOSIS — E559 Vitamin D deficiency, unspecified: Secondary | ICD-10-CM

## 2021-12-21 ENCOUNTER — Encounter: Payer: Self-pay | Admitting: Physician Assistant

## 2021-12-21 ENCOUNTER — Ambulatory Visit: Payer: No Typology Code available for payment source | Attending: Physician Assistant | Admitting: Physician Assistant

## 2021-12-21 VITALS — BP 135/85 | HR 84 | Resp 17 | Ht 68.0 in | Wt 264.4 lb

## 2021-12-21 DIAGNOSIS — M2550 Pain in unspecified joint: Secondary | ICD-10-CM | POA: Diagnosis not present

## 2021-12-21 DIAGNOSIS — M7918 Myalgia, other site: Secondary | ICD-10-CM

## 2021-12-21 DIAGNOSIS — M7061 Trochanteric bursitis, right hip: Secondary | ICD-10-CM | POA: Diagnosis not present

## 2021-12-21 DIAGNOSIS — G5761 Lesion of plantar nerve, right lower limb: Secondary | ICD-10-CM

## 2021-12-21 DIAGNOSIS — J45990 Exercise induced bronchospasm: Secondary | ICD-10-CM

## 2021-12-21 DIAGNOSIS — Z840 Family history of diseases of the skin and subcutaneous tissue: Secondary | ICD-10-CM

## 2021-12-21 DIAGNOSIS — E038 Other specified hypothyroidism: Secondary | ICD-10-CM

## 2021-12-21 DIAGNOSIS — M1712 Unilateral primary osteoarthritis, left knee: Secondary | ICD-10-CM

## 2021-12-21 DIAGNOSIS — M7062 Trochanteric bursitis, left hip: Secondary | ICD-10-CM

## 2021-12-21 DIAGNOSIS — F418 Other specified anxiety disorders: Secondary | ICD-10-CM

## 2021-12-21 DIAGNOSIS — M25511 Pain in right shoulder: Secondary | ICD-10-CM

## 2021-12-21 DIAGNOSIS — C44319 Basal cell carcinoma of skin of other parts of face: Secondary | ICD-10-CM

## 2021-12-21 DIAGNOSIS — G8929 Other chronic pain: Secondary | ICD-10-CM

## 2021-12-21 DIAGNOSIS — Z8719 Personal history of other diseases of the digestive system: Secondary | ICD-10-CM

## 2021-12-21 DIAGNOSIS — M533 Sacrococcygeal disorders, not elsewhere classified: Secondary | ICD-10-CM

## 2021-12-21 NOTE — Progress Notes (Signed)
Chief Complaint:   OBESITY Caitlin Barrett is here to discuss her progress with her obesity treatment plan along with follow-up of her obesity related diagnoses. Caitlin Barrett is on the Category 2 Plan and states she is following her eating plan approximately 85% of the time. Caitlin Barrett states she is walking 30 minutes 3 times per week.  Today's visit was #: 74 Starting weight: 277 lbs Starting date: 08/13/2019 Today's weight: 262 lbs Today's date: 12/19/2021 Total lbs lost to date: 15 lbs Total lbs lost since last in-office visit: +1 lb  Interim History:  Caitlin Barrett [name] (30 years old, soon to be 46) has recently experienced balance issues with poor appetite.   This has required Caitlin Barrett to hand feed Caitlin Barrett.  Also she was acutely ill with sinus congestion and headache, likely related to weather change.  She is treating with increased hydration and loratadine 10 mg daily.   Subjective:   1. Vitamin D deficiency 11/21/2021, Vitamin D level 34.6.  She is taking twice weekly Ergocalciferol, and also Women's Senior Multivitamin.    2. Other depression, with emotional eating Been seeing therapist, Dr Ardyth Man (female), bi-weekly sessions, all by video visit.  Stable mood, denies suicidal and homicidal ideations.  Currently on Cymbalta 30 mg daily.    Assessment/Plan:   1. Vitamin D deficiency Discussed labs with patient.  Continue current Vitamin D supplement and add in OTC Vitamin D-3 1,000 IU daily.   2. Other depression, with emotional eating Continue regular exercise, therapy and daily Cymbalta 30 mg daily.   3. Obesity, current BMI 38.8 Caitlin Barrett is currently in the action stage of change. As such, her goal is to continue with weight loss efforts. She has agreed to the Category 2 Plan.   Exercise goals:  As, add in body weight 2 times per week.   Behavioral modification strategies: increasing lean protein intake, decreasing simple carbohydrates, keeping healthy foods in the  home, ways to avoid boredom eating, and planning for success.  Caitlin Barrett has agreed to follow-up with our clinic in 4 weeks. She was informed of the importance of frequent follow-up visits to maximize her success with intensive lifestyle modifications for her multiple health conditions.   Objective:   Blood pressure 115/77, pulse 69, temperature 97.6 F (36.4 C), height '5\' 9"'$  (1.753 m), weight 262 lb (118.8 kg), last menstrual period 05/26/2016, SpO2 97 %. Body mass index is 38.69 kg/m.  General: Cooperative, alert, well developed, in no acute distress. HEENT: Conjunctivae and lids unremarkable. Cardiovascular: Regular rhythm.  Lungs: Normal work of breathing. Neurologic: No focal deficits.   Lab Results  Component Value Date   CREATININE 0.73 08/28/2021   BUN 13 08/28/2021   NA 140 08/28/2021   K 4.5 08/28/2021   CL 102 08/28/2021   CO2 25 08/28/2021   Lab Results  Component Value Date   ALT 9 08/28/2021   AST 11 08/28/2021   ALKPHOS 57 08/28/2021   BILITOT 0.4 08/28/2021   Lab Results  Component Value Date   HGBA1C 5.2 08/28/2021   HGBA1C 5.3 05/03/2021   HGBA1C 5.3 12/29/2020   HGBA1C 5.3 03/31/2020   HGBA1C 5.5 12/15/2019   Lab Results  Component Value Date   INSULIN 10.7 08/28/2021   INSULIN 17.5 05/03/2021   INSULIN 10.1 12/29/2020   INSULIN 8.9 06/09/2020   INSULIN 12.4 03/31/2020   Lab Results  Component Value Date   TSH 0.081 (L) 08/13/2019   Lab Results  Component Value Date   CHOL  200 (H) 05/03/2021   HDL 42 05/03/2021   LDLCALC 137 (H) 05/03/2021   TRIG 117 05/03/2021   CHOLHDL 4.8 (H) 05/03/2021   Lab Results  Component Value Date   VD25OH 34.6 11/21/2021   VD25OH 34.9 08/28/2021   VD25OH 50.5 05/03/2021   Lab Results  Component Value Date   WBC 9.8 03/31/2020   HGB 14.2 03/31/2020   HCT 40.9 03/31/2020   MCV 88 03/31/2020   PLT 394 03/31/2020   Lab Results  Component Value Date   IRON 64 08/13/2019   TIBC 333 08/13/2019    FERRITIN 30 08/13/2019   Attestation Statements:   Reviewed by clinician on day of visit: allergies, medications, problem list, medical history, surgical history, family history, social history, and previous encounter notes.  I, Davy Pique, RMA, am acting as Location manager for Mina Marble, NP.  I have reviewed the above documentation for accuracy and completeness, and I agree with the above. -  Agata Lucente d. Malkia Nippert, NP-C

## 2021-12-21 NOTE — Patient Instructions (Signed)
Integrative therapy

## 2021-12-22 ENCOUNTER — Ambulatory Visit: Payer: No Typology Code available for payment source | Admitting: Clinical

## 2022-01-02 ENCOUNTER — Ambulatory Visit: Payer: No Typology Code available for payment source | Admitting: Clinical

## 2022-01-02 DIAGNOSIS — F331 Major depressive disorder, recurrent, moderate: Secondary | ICD-10-CM | POA: Diagnosis not present
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
## 2022-01-03 ENCOUNTER — Ambulatory Visit
Admission: RE | Admit: 2022-01-03 | Discharge: 2022-01-03 | Disposition: A | Discharge status: Home / Self Care | Payer: No Typology Code available for payment source | Source: Ambulatory Visit | Attending: Emergency Medicine | Admitting: Emergency Medicine

## 2022-01-03 VITALS — BP 150/91 | HR 95 | Temp 98.3°F | Ht 68.0 in | Wt 265.0 lb

## 2022-01-03 DIAGNOSIS — J069 Acute upper respiratory infection, unspecified: Secondary | ICD-10-CM | POA: Diagnosis not present

## 2022-01-03 MED ORDER — IPRATROPIUM BROMIDE 0.06 % NA SOLN: 2.0000 | Freq: Four times a day (QID) | NASAL | 12 refills | Status: DC

## 2022-01-03 MED ORDER — PROMETHAZINE-DM 6.25-15 MG/5ML PO SYRP: 5.0000 mL | ORAL_SOLUTION | Freq: Four times a day (QID) | ORAL | 0 refills | Status: DC | PRN

## 2022-01-03 MED ORDER — AMOXICILLIN-POT CLAVULANATE 875-125 MG PO TABS: 1.0000 | ORAL_TABLET | Freq: Two times a day (BID) | ORAL | 0 refills | Status: AC

## 2022-01-03 MED ORDER — BENZONATATE 100 MG PO CAPS: 200.0000 mg | ORAL_CAPSULE | Freq: Three times a day (TID) | ORAL | 0 refills | Status: DC

## 2022-01-03 NOTE — Discharge Instructions (Signed)
The Augmentin twice daily with food for 10 days for treatment of your URI.  Perform sinus irrigation 2-3 times a day with a NeilMed sinus rinse kit and distilled water.  Do not use tap water.  You can use plain over-the-counter Mucinex every 6 hours to break up the stickiness of the mucus so your body can clear it.  Increase your oral fluid intake to thin out your mucus so that is also able for your body to clear more easily.  Take an over-the-counter probiotic, such as Culturelle-align-activia, 1 hour after each dose of antibiotic to prevent diarrhea.  Use the Atrovent nasal spray, 2 squirts in each nostril every 6 hours, as needed for runny nose and postnasal drip.  Use the Tessalon Perles every 8 hours during the day.  Take them with a small sip of water.  They may give you some numbness to the base of your tongue or a metallic taste in your mouth, this is normal.  Use the Promethazine DM cough syrup at bedtime for cough and congestion.  It will make you drowsy so do not take it during the day.  If you develop any new or worsening symptoms return for reevaluation or see your primary care provider.  

## 2022-01-03 NOTE — ED Triage Notes (Signed)
Pt c/o congestion onset Sunday evening, drainage down throat, pressure in eyes & ears, burning in chest

## 2022-01-03 NOTE — ED Provider Notes (Signed)
[facility]    CSN: [id number] Arrival date & time: 01/03/22  1906      History   Chief Complaint Chief Complaint  Patient presents with   Nasal Congestion   Cough   Otalgia    HPI Caitlin Barrett is a 51 y.o. female.   HPI  51 year old female here for evaluation of respiratory complaints.  Patient reports that for last 4 days she has been experiencing nasal congestion with postnasal drip, pressure behind her eyes, pressure in her ears, and burning in her chest.  She also endorses a sore throat and a nonproductive cough.  She denies fever, nasal discharge, ringing in ears, shortness breath or wheezing, or GI complaints.  Her husband has similar symptoms and he was recently diagnosed with a sinus infection.  Past Medical History:  Diagnosis Date   Allergy    seasonal.  Takes allergy shots at [facility]   Anemia    Anxiety    Arthritis    Asthma    exercise induced--no inhalers   Back pain    Cancer ([place]) 2010   Basal cell skin cancer Lt eye, skin graft above Lt eye   Constipation    Costochondritis    Depression    Dysmenorrhea    Eczema    Edema, lower extremity    GERD (gastroesophageal reflux disease)    Headache    Hypothyroid    Nodules and Fine needle aspiration   IBS (irritable bowel syndrome)    Lab test positive for detection of COVID-19 virus    November 2020   Myalgia    Osteoarthritis    Painful menstrual periods    Palpitations    Plantar fasciitis    Shortness of breath    Sprain of carpal (joint) of wrist    Vitamin B 12 deficiency    Vitamin D deficiency     Patient Active Problem List   Diagnosis Date Noted   Depression 08/11/2021   Bilateral lower extremity edema 08/06/2021   Status post nasal septoplasty 03/30/2021   Colon cancer screening    Polyp of colon    Depressed mood 12/01/2020   Nausea without vomiting 11/07/2020   Upper respiratory tract infection 11/07/2020   Thyroid disease 06/09/2020   Cervical  strain 04/25/2020   Fatigue 04/04/2020   GAD (generalized anxiety disorder) 02/08/2020   Pericardial pain 01/28/2020   Muscle soreness 01/28/2020   Mixed hyperlipidemia 11/25/2019   Elevated BP without diagnosis of hypertension 11/25/2019   Polyphagia 10/08/2019   Vitamin D deficiency 09/17/2019   Insulin resistance 09/17/2019   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   Class 3 severe obesity with serious comorbidity and body mass index (BMI) of 40.0 to 44.9 in adult [facility]) 09/17/2019   Exercise-induced asthma 04/22/2018   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   Monilial vaginitis 02/26/2017   Decreased libido 02/26/2017   Major depressive disorder, recurrent, in full remission ([place]) 02/26/2017   Dyspareunia, female 02/26/2017   S/P TAH (total abdominal hysterectomy) 07/02/2016   Nevus of abdominal wall 06/11/2016   Morton's neuralgia 07/21/2015   Nocturia 07/21/2015   Stress incontinence 07/21/2015   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   Allergic rhinitis 06/21/2015   Obesity (BMI 30-39.9) 06/21/2015    Past Surgical History:  Procedure Laterality Date   ABDOMINAL HYSTERECTOMY Bilateral 07/02/2016   Procedure: HYSTERECTOMY ABDOMINAL WITH BILATERAL SALPINGECTOMY-MIDLINE INCISION;  Surgeon: Brayton Mars, MD;  Location: [facility] ORS;  Service: Gynecology;  Laterality: Bilateral;   CHOLECYSTECTOMY  2009   COLONOSCOPY WITH PROPOFOL N/A 01/26/2021   Procedure: COLONOSCOPY WITH PROPOFOL;  Surgeon: Lin Landsman, MD;  Location: [facility] ENDOSCOPY;  Service: Gastroenterology;  Laterality: N/A;   fibromalga     HERNIA REPAIR  0211   umbilical   NASAL SEPTOPLASTY W/ TURBINOPLASTY Bilateral 03/03/2021   Procedure: NASAL SEPTOPLASTY WITH SUBMUCOSAL RESECTION OF TURBINATE;  Surgeon: Beverly Gust, MD;  Location: [name];  Service: ENT;  Laterality: Bilateral;   SKIN GRAFT  2010   TONSILLECTOMY      OB History     Gravida  0   Para  0   Term  0   Preterm  0   AB  0    Living  0      SAB  0   IAB  0   Ectopic  0   Multiple  0   Live Births  0            Home Medications    Prior to Admission medications   Medication Sig Start Date End Date Taking? Authorizing Provider  amoxicillin-clavulanate (AUGMENTIN) 875-125 MG tablet Take 1 tablet by mouth every 12 (twelve) hours for 10 days. 01/03/22 01/13/22 Yes Margarette Canada, NP  benzonatate (TESSALON) 100 MG capsule Take 2 capsules (200 mg total) by mouth every 8 (eight) hours. 01/03/22  Yes Margarette Canada, NP  ipratropium (ATROVENT) 0.06 % nasal spray Place 2 sprays into both nostrils 4 (four) times daily. 01/03/22  Yes Margarette Canada, NP  promethazine-dextromethorphan (PROMETHAZINE-DM) 6.25-15 MG/5ML syrup Take 5 mLs by mouth 4 (four) times daily as needed. 01/03/22  Yes Margarette Canada, NP  albuterol (VENTOLIN HFA) 108 (90 Base) MCG/ACT inhaler TAKE 2 PUFFS BY MOUTH EVERY 6 HOURS AS NEEDED FOR WHEEZE OR SHORTNESS OF BREATH 04/07/21   Hughie Closs, PA-C  celecoxib (CELEBREX) 100 MG capsule Take 100 mg by mouth daily. Patient not taking: Reported on 12/21/2021 06/28/21   [provider]  clobetasol cream (TEMOVATE) 1.73 % Apply 1 application topically 2 (two) times daily. For up to 1-2 weeks then as needed. Do not use on sensitive skin. 04/24/19   Karamalegos, Devonne Doughty, DO  COLLAGEN-BORON-HYALURONIC ACID PO Take by mouth.    [provider]  cyclobenzaprine (FLEXERIL) 10 MG tablet TAKE 1 TABLET BY MOUTH AT BEDTIME AS NEEDED FOR MUSCLE SPASMS. 07/18/21   Karamalegos, Devonne Doughty, DO  DULoxetine (CYMBALTA) 30 MG capsule Take 1 capsule (30 mg total) by mouth daily. 11/21/21   Danford, Valetta Fuller D, NP  EPINEPHrine 0.3 mg/0.3 mL IJ SOAJ injection 0.3 mg as needed. 09/25/08   [provider]  estradiol (ESTRACE) 1 MG tablet TAKE 1 TABLET BY MOUTH EVERY DAY 12/11/21   Rubie Maid, MD  fluticasone [facility]) 50 MCG/ACT nasal spray Place into both nostrils. 05/17/21   [provider]   loratadine (CLARITIN) 10 MG tablet Take 10 mg by mouth daily as needed. 10/13/20   [provider]  Loratadine 10 MG CAPS Take by mouth. Patient not taking: Reported on 12/21/2021    [provider]  Misc Natural Products (OSTEO BI-FLEX ADV TRIPLE ST PO) Take by mouth.    [provider]  Multiple Vitamins-Minerals (MULTIVITAMIN WOMEN PO) Take by mouth.    [provider]  omeprazole (PRILOSEC) 40 MG capsule Take 40 mg by mouth as needed.    [provider]  ondansetron (ZOFRAN) 4 MG tablet Take 1 tablet (4 mg total) by mouth daily as needed for nausea or  vomiting. 03/03/21 03/03/22  Beverly Gust, MD  ondansetron (ZOFRAN-ODT) 8 MG disintegrating tablet Take 1 tablet (8 mg total) by mouth every 8 (eight) hours as needed for nausea or vomiting. Patient not taking: Reported on 12/21/2021 11/03/20   Mina Marble D, NP  oxymetazoline (AFRIN) 0.05 % nasal spray Place 1 spray into both nostrils as needed.     [provider]  Probiotic Product (TRUBIOTICS) CAPS Take 1 capsule by mouth daily.    [provider]  progesterone (PROMETRIUM) 200 MG capsule TAKE 1 CAPSULE BY MOUTH EVERY DAY 10/19/21   Rubie Maid, MD  triamcinolone cream (KENALOG) 0.1 % APPLY THIN FILM TWICE DAILY TO ECZEMA UNTIL CLEAR 12/23/18   [provider]  TURMERIC PO Take by mouth daily.    [provider]  Vitamin D, Ergocalciferol, (DRISDOL) 1.25 MG (50000 UNIT) CAPS capsule TAKE ONE CAPSULE BY MOUTH TWICE PER WEEK 11/22/21   Danford, Valetta Fuller D, NP    Family History Family History  Problem Relation Age of Onset   Diabetes Mother    Hyperlipidemia Mother    Hypertension Mother    Sleep apnea Mother    Obesity Mother    Cancer Father        lung CA   Heart disease Maternal Grandmother    Stroke Maternal Grandmother    Hypertension Maternal Grandmother    Diabetes Maternal Grandmother    Heart disease Maternal Grandfather    Diabetes Maternal  Grandfather    Heart attack Maternal Grandfather     Social History Social History   Tobacco Use   Smoking status: Never    Passive exposure: Never   Smokeless tobacco: Never  Vaping Use   Vaping Use: Never used  Substance Use Topics   Alcohol use: Yes    Comment: rare   Drug use: No     Allergies   Iodinated contrast media and Codeine   Review of Systems Review of Systems  Constitutional:  Negative for fever.  HENT:  Positive for congestion, ear pain, postnasal drip and sore throat. Negative for rhinorrhea.   Respiratory:  Positive for cough. Negative for shortness of breath and wheezing.      Physical Exam Triage Vital Signs ED Triage Vitals  Enc Vitals Group     BP 01/03/22 1930 (!) 150/91     Pulse Rate 01/03/22 1930 95     Resp --      Temp 01/03/22 1930 98.3 F (36.8 C)     Temp src --      SpO2 01/03/22 1930 96 %     Weight 01/03/22 1929 265 lb (120.2 kg)     Height 01/03/22 1929 5' 8"  (1.727 m)     Head Circumference --      Peak Flow --      Pain Score 01/03/22 1929 2     Pain Loc --      Pain Edu? --      Excl. in [place]? --    No data found.  Updated Vital Signs BP (!) 150/91   Pulse 95   Temp 98.3 F (36.8 C)   Ht 5' 8"  (1.727 m)   Wt 265 lb (120.2 kg)   LMP 05/26/2016 (Exact Date)   SpO2 96%   BMI 40.29 kg/m   Visual Acuity Right Eye Distance:   Left Eye Distance:   Bilateral Distance:    Right Eye Near:   Left Eye Near:    Bilateral Near:  Physical Exam Vitals and nursing note reviewed.  Constitutional:      Appearance: Normal appearance. She is not ill-appearing.  HENT:     Head: Normocephalic and atraumatic.     Right Ear: Tympanic membrane, ear canal and external ear normal. There is no impacted cerumen.     Left Ear: Tympanic membrane, ear canal and external ear normal. There is no impacted cerumen.     Nose: Congestion and rhinorrhea present.     Mouth/Throat:     Mouth: Mucous membranes are moist.     Pharynx:  Oropharynx is clear. Posterior oropharyngeal erythema present. No oropharyngeal exudate.  Cardiovascular:     Rate and Rhythm: Normal rate and regular rhythm.     Pulses: Normal pulses.     Heart sounds: Normal heart sounds. No murmur heard.    No friction rub. No gallop.  Pulmonary:     Effort: Pulmonary effort is normal.     Breath sounds: Normal breath sounds. No wheezing, rhonchi or rales.  Musculoskeletal:     Cervical back: Normal range of motion and neck supple.  Lymphadenopathy:     Cervical: No cervical adenopathy.  Skin:    General: Skin is warm and dry.     Capillary Refill: Capillary refill takes less than 2 seconds.     Findings: No erythema or rash.  Neurological:     General: No focal deficit present.     Mental Status: She is alert and oriented to person, place, and time.  Psychiatric:        Mood and Affect: Mood normal.        Behavior: Behavior normal.        Thought Content: Thought content normal.        Judgment: Judgment normal.      UC Treatments / Results  Labs (all labs ordered are listed, but only abnormal results are displayed) Labs Reviewed - No data to display  EKG   Radiology No results found.  Procedures Procedures (including critical care time)  Medications Ordered in UC Medications - No data to display  Initial Impression / Assessment and Plan / UC Course  I have reviewed the triage vital signs and the nursing notes.  Pertinent labs & imaging results that were available during my care of the patient were reviewed by me and considered in my medical decision making (see chart for details).   Patient is a pleasant, nontoxic-appearing 51 year old female here for evaluation of upper respiratory complaints outlined HPI above.  On exam patient's pearly-gray tympanic membranes bilaterally with normal reflex and clear external auditory canals.  Patient states mucosa is erythematous and edematous with purulent discharge in both nares.  She  also is tenderness to percussion of bilateral maxillary sinuses and her right frontal sinus.  Oropharyngeal exam reveals mild posterior oropharyngeal erythema with yellow postnasal drip.  No cervical lymphadenopathy on exam.  Cardiopulmonary exam physical lung sounds in all fields.  Patient exam is consistent with an upper respiratory infection.  I will discharge her home on Augmentin to cover for potential bacterial sources as well as Atrovent nasal spray to help with the nasal congestion, Tessalon Perles and Promethazine DM cough syrup to help with cough and congestion.   Final Clinical Impressions(s) / UC Diagnoses   Final diagnoses:  Upper respiratory tract infection, unspecified type     Discharge Instructions      The Augmentin twice daily with food for 10 days for treatment of your URI.  Perform  sinus irrigation 2-3 times a day with a NeilMed sinus rinse kit and distilled water.  Do not use tap water.  You can use plain over-the-counter Mucinex every 6 hours to break up the stickiness of the mucus so your body can clear it.  Increase your oral fluid intake to thin out your mucus so that is also able for your body to clear more easily.  Take an over-the-counter probiotic, such as Culturelle-align-activia, 1 hour after each dose of antibiotic to prevent diarrhea.  Use the Atrovent nasal spray, 2 squirts in each nostril every 6 hours, as needed for runny nose and postnasal drip.  Use the Tessalon Perles every 8 hours during the day.  Take them with a small sip of water.  They may give you some numbness to the base of your tongue or a metallic taste in your mouth, this is normal.  Use the Promethazine DM cough syrup at bedtime for cough and congestion.  It will make you drowsy so do not take it during the day.  If you develop any new or worsening symptoms return for reevaluation or see your primary care provider.      ED Prescriptions     Medication Sig Dispense Auth. Provider    amoxicillin-clavulanate (AUGMENTIN) 875-125 MG tablet Take 1 tablet by mouth every 12 (twelve) hours for 10 days. 20 tablet Margarette Canada, NP   ipratropium (ATROVENT) 0.06 % nasal spray Place 2 sprays into both nostrils 4 (four) times daily. 15 mL Margarette Canada, NP   benzonatate (TESSALON) 100 MG capsule Take 2 capsules (200 mg total) by mouth every 8 (eight) hours. 21 capsule Margarette Canada, NP   promethazine-dextromethorphan (PROMETHAZINE-DM) 6.25-15 MG/5ML syrup Take 5 mLs by mouth 4 (four) times daily as needed. 118 mL Margarette Canada, NP      PDMP not reviewed this encounter.   Margarette Canada, NP 01/03/22 2011

## 2022-01-04 ENCOUNTER — Other Ambulatory Visit (INDEPENDENT_AMBULATORY_CARE_PROVIDER_SITE_OTHER): Payer: Self-pay | Admitting: Adult Health

## 2022-01-04 DIAGNOSIS — E559 Vitamin D deficiency, unspecified: Secondary | ICD-10-CM

## 2022-01-09 ENCOUNTER — Ambulatory Visit
Admission: RE | Admit: 2022-01-09 | Discharge: 2022-01-09 | Disposition: A | Discharge status: Home / Self Care | Payer: No Typology Code available for payment source | Source: Ambulatory Visit | Attending: Internal Medicine | Admitting: Internal Medicine

## 2022-01-09 VITALS — BP 159/81 | HR 87 | Temp 98.5°F | Resp 16

## 2022-01-09 DIAGNOSIS — S39012A Strain of muscle, fascia and tendon of lower back, initial encounter: Secondary | ICD-10-CM | POA: Diagnosis not present

## 2022-01-09 NOTE — Discharge Instructions (Signed)
He is continue to take ibuprofen and muscle relaxant Gentle stretching exercises will help with pain and muscle stiffness Please do not drive or operate heavy machinery after taking muscle relaxant since that would make you drowsy Heating pad use only 20-minute on-20 minutes off cycle helps with pain Return to urgent care if symptoms worsen. There is no indication for x-rays at this time.
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
## 2022-01-16 ENCOUNTER — Ambulatory Visit: Payer: No Typology Code available for payment source | Admitting: Clinical

## 2022-01-17 ENCOUNTER — Encounter (INDEPENDENT_AMBULATORY_CARE_PROVIDER_SITE_OTHER): Payer: Self-pay | Admitting: Adult Health

## 2022-01-18 ENCOUNTER — Telehealth (INDEPENDENT_AMBULATORY_CARE_PROVIDER_SITE_OTHER): Payer: No Typology Code available for payment source | Admitting: Adult Health

## 2022-01-18 DIAGNOSIS — Z6838 Body mass index (BMI) 38.0-38.9, adult: Secondary | ICD-10-CM

## 2022-01-18 DIAGNOSIS — E559 Vitamin D deficiency, unspecified: Secondary | ICD-10-CM

## 2022-01-18 DIAGNOSIS — F3289 Other specified depressive episodes: Secondary | ICD-10-CM

## 2022-01-18 DIAGNOSIS — E669 Obesity, unspecified: Secondary | ICD-10-CM | POA: Diagnosis not present

## 2022-01-18 MED ORDER — DULOXETINE HCL 40 MG PO CPEP: 40.0000 mg | ORAL_CAPSULE | Freq: Every day | ORAL | 0 refills | Status: DC
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
## 2022-01-31 ENCOUNTER — Encounter: Payer: Self-pay | Admitting: Family Medicine

## 2022-01-31 ENCOUNTER — Ambulatory Visit: Payer: No Typology Code available for payment source | Admitting: Family Medicine

## 2022-01-31 VITALS — BP 134/80 | HR 80 | Ht 68.0 in | Wt 273.8 lb

## 2022-01-31 DIAGNOSIS — S134XXA Sprain of ligaments of cervical spine, initial encounter: Secondary | ICD-10-CM | POA: Diagnosis not present

## 2022-01-31 DIAGNOSIS — M62838 Other muscle spasm: Secondary | ICD-10-CM | POA: Diagnosis not present

## 2022-01-31 DIAGNOSIS — M545 Low back pain, unspecified: Secondary | ICD-10-CM | POA: Diagnosis not present

## 2022-01-31 DIAGNOSIS — M542 Cervicalgia: Secondary | ICD-10-CM

## 2022-01-31 MED ORDER — CYCLOBENZAPRINE HCL 10 MG PO TABS: 10.0000 mg | ORAL_TABLET | Freq: Every evening | ORAL | 5 refills | Status: DC | PRN
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
## 2022-01-31 NOTE — Patient Instructions (Addendum)
Thank you for coming to the office today.  Likely persistent muscle spasms and strain  Re ordered Flexeril at night  Keep on ibuprofen  ONCE you taper down on Ibuprofen - START anti inflammatory topical - OTC Voltaren (generic Diclofenac) topical 2-4 times a day as needed for pain swelling of affected joint for 1-2 weeks or longer.  Also try [facility] or similar for quick relief as needed  Use heating pad still  Referral to [facility]  Anticipate 2-3 more weeks until significant improvement   Please schedule a Follow-up Appointment to: Return if symptoms worsen or fail to improve.  If you have any other questions or concerns, please feel free to call the office or send a message through [place]. You may also schedule an earlier appointment if necessary.  Additionally, you may be receiving a survey about your experience at our office within a few days to 1 week by e-mail or mail. We value your feedback.  Nobie Putnam, DO [facility], [facility]  Cervical Strain and Sprain Rehab Ask your health care provider which exercises are safe for you. Do exercises exactly as told by your health care provider and adjust them as directed. It is normal to feel mild stretching, pulling, tightness, or discomfort as you do these exercises. Stop right away if you feel sudden pain or your pain gets worse. Do not begin these exercises until told by your health care provider. Stretching and range-of-motion exercises Cervical side bending  Using good posture, sit on a stable chair or stand up. Without moving your shoulders, slowly tilt your left / right ear to your shoulder until you feel a stretch in the neck muscles on the opposite side. You should be looking straight ahead. Hold for __________ seconds. Repeat with the other side of your neck. Repeat __________ times. Complete this exercise __________ times a day. Cervical rotation  Using good posture, sit on a  stable chair or stand up. Slowly turn your head to the side as if you are looking over your left / right shoulder. Keep your eyes level with the ground. Stop when you feel a stretch along the side and the back of your neck. Hold for __________ seconds. Repeat this by turning to your other side. Repeat __________ times. Complete this exercise __________ times a day. Thoracic extension and pectoral stretch  Roll a towel or a small blanket so it is about 4 inches (10 cm) in diameter. Lie down on your back on a firm surface. Put the towel in the middle of your back across your spine. It should not be under your shoulder blades. Put your hands behind your head and let your elbows fall out to your sides. Hold for __________ seconds. Repeat __________ times. Complete this exercise __________ times a day. Strengthening exercises Upper cervical flexion  Lie on your back with a thin pillow behind your head or a small, rolled-up towel under your neck. Gently tuck your chin toward your chest and nod your head down to look toward your feet. Do not lift your head off the pillow. Hold for __________ seconds. Release the tension slowly. Relax your neck muscles completely before you repeat this exercise. Repeat __________ times. Complete this exercise __________ times a day. Cervical extension  Stand about 6 inches (15 cm) away from a wall, with your back facing the wall. Place a soft object, about 6-8 inches (15-20 cm) in diameter, between the back of your head and the wall. A soft  object could be a small pillow, a ball, or a folded towel. Gently tilt your head back and press into the soft object. Keep your jaw and forehead relaxed. Hold for __________ seconds. Release the tension slowly. Relax your neck muscles completely before you repeat this exercise. Repeat __________ times. Complete this exercise __________ times a day. Posture and body mechanics Body mechanics refer to the movements and  positions of your body while you do your daily activities. Posture is part of body mechanics. Good posture and healthy body mechanics can help to relieve stress in your body's tissues and joints. Good posture means that your spine is in its natural S-curve position (your spine is neutral), your shoulders are pulled back slightly, and your head is not tipped forward. The following are general guidelines for using improved posture and body mechanics in your everyday activities. Sitting  When sitting, keep your spine neutral and keep your feet flat on the floor. Use a footrest, if needed, and keep your thighs parallel to the floor. Avoid rounding your shoulders. Avoid tilting your head forward. When working at a desk or a computer, keep your desk at a height where your hands are slightly lower than your elbows. Slide your chair under your desk so you are close enough to maintain good posture. When working at a computer, place your monitor at a height where you are looking straight ahead and you do not have to tilt your head forward or downward to look at the screen. Standing  When standing, keep your spine neutral and keep your feet about hip-width apart. Keep a slight bend in your knees. Your ears, shoulders, and hips should line up. When you do a task in which you stand in one place for a long time, place one foot up on a stable object that is 2-4 inches (5-10 cm) high, such as a footstool. This helps keep your spine neutral. Resting When lying down and resting, avoid positions that are most painful for you. Try to support your neck in a neutral position. You can use a contour pillow or a small rolled-up towel. Your pillow should support your neck but not push on it. This information is not intended to replace advice given to you by your health care provider. Make sure you discuss any questions you have with your health care provider. Document Revised: 10/02/2021 Document Reviewed: 10/02/2021 Elsevier  Patient Education  [employer].

## 2022-02-01 ENCOUNTER — Encounter: Payer: Self-pay | Admitting: Family Medicine

## 2022-02-01 ENCOUNTER — Ambulatory Visit (INDEPENDENT_AMBULATORY_CARE_PROVIDER_SITE_OTHER): Payer: No Typology Code available for payment source | Admitting: Clinical

## 2022-02-01 DIAGNOSIS — F331 Major depressive disorder, recurrent, moderate: Secondary | ICD-10-CM
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
## 2022-02-06 DIAGNOSIS — M25562 Pain in left knee: Secondary | ICD-10-CM | POA: Insufficient documentation

## 2022-02-10 ENCOUNTER — Other Ambulatory Visit (INDEPENDENT_AMBULATORY_CARE_PROVIDER_SITE_OTHER): Payer: Self-pay | Admitting: Adult Health

## 2022-02-10 DIAGNOSIS — F3289 Other specified depressive episodes: Secondary | ICD-10-CM

## 2022-02-13 ENCOUNTER — Ambulatory Visit (INDEPENDENT_AMBULATORY_CARE_PROVIDER_SITE_OTHER): Payer: No Typology Code available for payment source | Admitting: Family Medicine

## 2022-03-01 ENCOUNTER — Encounter (INDEPENDENT_AMBULATORY_CARE_PROVIDER_SITE_OTHER): Payer: Self-pay | Admitting: Family Medicine

## 2022-03-01 ENCOUNTER — Ambulatory Visit (INDEPENDENT_AMBULATORY_CARE_PROVIDER_SITE_OTHER): Payer: No Typology Code available for payment source | Admitting: Family Medicine

## 2022-03-01 VITALS — BP 138/82 | HR 69 | Temp 98.3°F | Ht 69.0 in | Wt 267.0 lb

## 2022-03-01 DIAGNOSIS — Z6839 Body mass index (BMI) 39.0-39.9, adult: Secondary | ICD-10-CM

## 2022-03-01 DIAGNOSIS — E669 Obesity, unspecified: Secondary | ICD-10-CM

## 2022-03-01 DIAGNOSIS — E7849 Other hyperlipidemia: Secondary | ICD-10-CM

## 2022-03-01 DIAGNOSIS — R5383 Other fatigue: Secondary | ICD-10-CM | POA: Diagnosis not present

## 2022-03-01 DIAGNOSIS — E559 Vitamin D deficiency, unspecified: Secondary | ICD-10-CM | POA: Diagnosis not present

## 2022-03-01 DIAGNOSIS — F329 Major depressive disorder, single episode, unspecified: Secondary | ICD-10-CM | POA: Diagnosis not present

## 2022-03-01 MED ORDER — VITAMIN D (ERGOCALCIFEROL) 1.25 MG (50000 UNIT) PO CAPS: ORAL_CAPSULE | ORAL | 0 refills | Status: DC

## 2022-03-01 MED ORDER — DULOXETINE HCL 40 MG PO CPEP: 40.0000 mg | ORAL_CAPSULE | Freq: Every day | ORAL | 0 refills | Status: DC

## 2022-03-02 ENCOUNTER — Ambulatory Visit (INDEPENDENT_AMBULATORY_CARE_PROVIDER_SITE_OTHER): Payer: No Typology Code available for payment source | Admitting: Clinical

## 2022-03-02 DIAGNOSIS — F331 Major depressive disorder, recurrent, moderate: Secondary | ICD-10-CM
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
## 2022-03-03 ENCOUNTER — Other Ambulatory Visit (INDEPENDENT_AMBULATORY_CARE_PROVIDER_SITE_OTHER): Payer: Self-pay | Admitting: Family Medicine

## 2022-03-03 DIAGNOSIS — E559 Vitamin D deficiency, unspecified: Secondary | ICD-10-CM

## 2022-03-06 LAB — LIPID PANEL
Chol/HDL Ratio: 5.2 ratio — ABNORMAL HIGH (ref 0.0–4.4)
Cholesterol, Total: 207 mg/dL — ABNORMAL HIGH (ref 100–199)
HDL: 40 mg/dL (ref >39)
LDL Chol Calc (NIH): 138 mg/dL — ABNORMAL HIGH (ref 0–99)
Triglycerides: 160 mg/dL — ABNORMAL HIGH (ref 0–149)
VLDL Cholesterol Cal: 29 mg/dL (ref 5–40)

## 2022-03-06 LAB — CBC
Hematocrit: 37.7 % (ref 34.0–46.6)
Hemoglobin: 13 g/dL (ref 11.1–15.9)
MCH: 30.4 pg (ref 26.6–33.0)
MCHC: 34.5 g/dL (ref 31.5–35.7)
MCV: 88 fL (ref 79–97)
Platelets: 382 10*3/uL (ref 150–450)
RBC: 4.27 x10E6/uL (ref 3.77–5.28)
RDW: 11.8 % (ref 11.7–15.4)
WBC: 10.3 10*3/uL (ref 3.4–10.8)

## 2022-03-06 LAB — T4F: T4,Free (Direct): 0.8 ng/dL — ABNORMAL LOW (ref 0.82–1.77)

## 2022-03-06 LAB — TSH RFX ON ABNORMAL TO FREE T4: TSH: 0.083 u[IU]/mL — ABNORMAL LOW (ref 0.450–4.500)

## 2022-03-06 LAB — VITAMIN D, 25-HYDROXY, TOTAL: Vitamin D, 25-Hydroxy, Serum: 61 ng/mL
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
## 2022-03-29 ENCOUNTER — Ambulatory Visit (INDEPENDENT_AMBULATORY_CARE_PROVIDER_SITE_OTHER): Payer: Commercial Managed Care - PPO | Admitting: Clinical

## 2022-03-29 DIAGNOSIS — F331 Major depressive disorder, recurrent, moderate: Secondary | ICD-10-CM | POA: Diagnosis not present
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
## 2022-04-04 ENCOUNTER — Ambulatory Visit (INDEPENDENT_AMBULATORY_CARE_PROVIDER_SITE_OTHER): Payer: Commercial Managed Care - PPO | Admitting: Adult Health

## 2022-04-04 ENCOUNTER — Encounter (INDEPENDENT_AMBULATORY_CARE_PROVIDER_SITE_OTHER): Payer: Self-pay | Admitting: Adult Health

## 2022-04-04 VITALS — BP 136/84 | HR 78 | Temp 98.0°F | Ht 69.0 in | Wt 267.0 lb

## 2022-04-04 DIAGNOSIS — E782 Mixed hyperlipidemia: Secondary | ICD-10-CM

## 2022-04-04 DIAGNOSIS — E079 Disorder of thyroid, unspecified: Secondary | ICD-10-CM

## 2022-04-04 DIAGNOSIS — E66813 Obesity, class 3: Secondary | ICD-10-CM

## 2022-04-04 DIAGNOSIS — F339 Major depressive disorder, recurrent, unspecified: Secondary | ICD-10-CM | POA: Diagnosis not present

## 2022-04-04 DIAGNOSIS — E559 Vitamin D deficiency, unspecified: Secondary | ICD-10-CM

## 2022-04-04 DIAGNOSIS — F329 Major depressive disorder, single episode, unspecified: Secondary | ICD-10-CM

## 2022-04-04 DIAGNOSIS — Z6839 Body mass index (BMI) 39.0-39.9, adult: Secondary | ICD-10-CM

## 2022-04-04 DIAGNOSIS — E669 Obesity, unspecified: Secondary | ICD-10-CM

## 2022-04-04 MED ORDER — VITAMIN D (ERGOCALCIFEROL) 1.25 MG (50000 UNIT) PO CAPS: ORAL_CAPSULE | ORAL | 0 refills | Status: DC

## 2022-04-11 MED ORDER — DULOXETINE HCL 40 MG PO CPEP: 40.0000 mg | ORAL_CAPSULE | Freq: Every day | ORAL | 0 refills | Status: DC

## 2022-04-11 NOTE — Progress Notes (Signed)
Chief Complaint:   OBESITY Caitlin Barrett is here to discuss her progress with her obesity treatment plan along with follow-up of her obesity related diagnoses. Caitlin Barrett is on the Category 2 Plan and states she is following her eating plan approximately 85% of the time. Caitlin Barrett states she is walking, strength training 20-30 minutes 2-3 times per week.  Today's visit was #: 62 Starting weight: 277 LBS Starting date: 08/13/2019 Today's weight: 267 LBS Today's date: 04/04/2022 Total lbs lost to date: 10 LBS Total lbs lost since last in-office visit: 0  Interim History:  Health Goals for 2024 1) Achieve steady loss throughout the year. 2) Increase "exercise" outside of work. 3) Prioritize "me"- great one!  Subjective:   1. Vitamin D deficiency Discussed labs with patient today. Patient endorses increased energy.   03/01/2022, vitamin D level 61.  11/21/2021- Vitamin D level 34.6- she was on twice weekly ergocalciferol and OTC women's senior multivitamin at time of this lab draw. 12/19/2021 - added OTC Vitamin D-3 1,000 IU daily.   2. Mixed hyperlipidemia Lipid Panel     Component Value Date/Time   CHOL 207 (H) 03/01/2022 0813   TRIG 160 (H) 03/01/2022 0813   HDL 40 03/01/2022 0813   CHOLHDL 5.2 (H) 03/01/2022 0813   CHOLHDL 4.8 10/23/2017 0830   VLDL 19 10/01/2016 0835   LDLCALC 138 (H) 03/01/2022 0813   LDLCALC 128 (H) 10/23/2017 0830   LABVLDL 29 03/01/2022 0813    Patient has not been on statin therapy, but has a twin on statin therapy for 2 years. The 10-year ASCVD risk score (Arnett DK, et al., 2019) is: 2%   Values used to calculate the score:     Age: 30 years     Sex: Female     Is Non-Hispanic African American: No     Diabetic: No     Tobacco smoker: No     Systolic Blood Pressure: 161 mmHg     Is BP treated: No     HDL Cholesterol: 40 mg/dL     Total Cholesterol: 207 mg/dL  3. Thyroid disease Patient is currently followed by endocrinology, Dr. Honor Junes.    03/01/2022, TSH was 0.083, T4-0.80, patient does not have any symptoms.   Patient was on levothyroxine therapy in past, been off > 3 years ago.  4. Episode of recurrent major depressive disorder, unspecified depression episode severity ([place]) Patient is taking duloxetine 40 mg daily, December 2023.   Continued with monthly therapy via Telemedicine OVs.  Assessment/Plan:   1. Vitamin D deficiency  Refill- Vitamin D, Ergocalciferol, (DRISDOL) 1.25 MG (50000 UNIT) CAPS capsule; TAKE ONE CAPSULE BY MOUTH TWICE PER WEEK  Dispense: 16 capsule; Refill: 0  2. Mixed hyperlipidemia Decrease saturated fats.   Increase regular exercise.  3. Thyroid disease Follow-up with endocrinology at the end of the month.  4. Episode of recurrent major depressive disorder, unspecified depression episode severity ([facility]) Refill duloxetine 40 mg.  5. Obesity,current BMI 39.5 Caitlin Barrett is currently in the action stage of change. As such, her goal is to continue with weight loss efforts. She has agreed to the Category 2 Plan.   Exercise goals:  Habit nest.  Behavioral modification strategies: increasing lean protein intake, decreasing simple carbohydrates, meal planning and cooking strategies, keeping healthy foods in the home, and planning for success.  Caitlin Barrett has agreed to follow-up with our clinic in 4 weeks. She was informed of the importance of frequent follow-up visits to maximize her success with  intensive lifestyle modifications for her multiple health conditions.   Objective:   Blood pressure 136/84, pulse 78, temperature 98 F (36.7 C), height '5\' 9"'$  (1.753 m), weight 267 lb (121.1 kg), last menstrual period 05/26/2016, SpO2 98 %. Body mass index is 39.43 kg/m.  General: Cooperative, alert, well developed, in no acute distress. HEENT: Conjunctivae and lids unremarkable. Cardiovascular: Regular rhythm.  Lungs: Normal work of breathing. Neurologic: No focal deficits.   Lab Results  Component  Value Date   CREATININE 0.73 08/28/2021   BUN 13 08/28/2021   NA 140 08/28/2021   K 4.5 08/28/2021   CL 102 08/28/2021   CO2 25 08/28/2021   Lab Results  Component Value Date   ALT 9 08/28/2021   AST 11 08/28/2021   ALKPHOS 57 08/28/2021   BILITOT 0.4 08/28/2021   Lab Results  Component Value Date   HGBA1C 5.2 08/28/2021   HGBA1C 5.3 05/03/2021   HGBA1C 5.3 12/29/2020   HGBA1C 5.3 03/31/2020   HGBA1C 5.5 12/15/2019   Lab Results  Component Value Date   INSULIN 10.7 08/28/2021   INSULIN 17.5 05/03/2021   INSULIN 10.1 12/29/2020   INSULIN 8.9 06/09/2020   INSULIN 12.4 03/31/2020   Lab Results  Component Value Date   TSH 0.083 (L) 03/01/2022   Lab Results  Component Value Date   CHOL 207 (H) 03/01/2022   HDL 40 03/01/2022   LDLCALC 138 (H) 03/01/2022   TRIG 160 (H) 03/01/2022   CHOLHDL 5.2 (H) 03/01/2022   Lab Results  Component Value Date   VD25OH 34.6 11/21/2021   VD25OH 34.9 08/28/2021   VD25OH 50.5 05/03/2021   Lab Results  Component Value Date   WBC 10.3 03/01/2022   HGB 13.0 03/01/2022   HCT 37.7 03/01/2022   MCV 88 03/01/2022   PLT 382 03/01/2022   Lab Results  Component Value Date   IRON 64 08/13/2019   TIBC 333 08/13/2019   FERRITIN 30 08/13/2019   Attestation Statements:   Reviewed by clinician on day of visit: allergies, medications, problem list, medical history, surgical history, family history, social history, and previous encounter notes.  I, Davy Pique, RMA, am acting as Location manager for Mina Marble, NP.  I have reviewed the above documentation for accuracy and completeness, and I agree with the above. -  Buck Mcaffee d. Eufemio Strahm, NP-C

## 2022-04-24 ENCOUNTER — Encounter (INDEPENDENT_AMBULATORY_CARE_PROVIDER_SITE_OTHER): Payer: Self-pay | Admitting: Adult Health

## 2022-04-29 ENCOUNTER — Other Ambulatory Visit (INDEPENDENT_AMBULATORY_CARE_PROVIDER_SITE_OTHER): Payer: Self-pay | Admitting: Adult Health

## 2022-04-29 DIAGNOSIS — E559 Vitamin D deficiency, unspecified: Secondary | ICD-10-CM

## 2022-05-02 ENCOUNTER — Encounter (INDEPENDENT_AMBULATORY_CARE_PROVIDER_SITE_OTHER): Payer: Self-pay | Admitting: Adult Health

## 2022-05-02 ENCOUNTER — Ambulatory Visit (INDEPENDENT_AMBULATORY_CARE_PROVIDER_SITE_OTHER): Payer: Commercial Managed Care - PPO | Admitting: Adult Health

## 2022-05-02 VITALS — BP 132/83 | HR 82 | Temp 97.7°F | Ht 69.0 in | Wt 268.0 lb

## 2022-05-02 DIAGNOSIS — Z6839 Body mass index (BMI) 39.0-39.9, adult: Secondary | ICD-10-CM | POA: Diagnosis not present

## 2022-05-02 DIAGNOSIS — E559 Vitamin D deficiency, unspecified: Secondary | ICD-10-CM

## 2022-05-02 DIAGNOSIS — F329 Major depressive disorder, single episode, unspecified: Secondary | ICD-10-CM

## 2022-05-02 MED ORDER — DULOXETINE HCL 40 MG PO CPEP: 40.0000 mg | ORAL_CAPSULE | Freq: Every day | ORAL | 0 refills | Status: DC

## 2022-05-03 LAB — VITAMIN D 25 HYDROXY (VIT D DEFICIENCY, FRACTURES): Vit D, 25-Hydroxy: 45.2 ng/mL (ref 30.0–100.0)

## 2022-05-07 ENCOUNTER — Other Ambulatory Visit (INDEPENDENT_AMBULATORY_CARE_PROVIDER_SITE_OTHER): Payer: Self-pay | Admitting: Adult Health

## 2022-05-07 ENCOUNTER — Encounter (INDEPENDENT_AMBULATORY_CARE_PROVIDER_SITE_OTHER): Payer: Self-pay | Admitting: Adult Health

## 2022-05-07 DIAGNOSIS — E559 Vitamin D deficiency, unspecified: Secondary | ICD-10-CM

## 2022-05-07 MED ORDER — VITAMIN D (ERGOCALCIFEROL) 1.25 MG (50000 UNIT) PO CAPS: ORAL_CAPSULE | ORAL | 0 refills | Status: DC

## 2022-05-09 ENCOUNTER — Ambulatory Visit (INDEPENDENT_AMBULATORY_CARE_PROVIDER_SITE_OTHER): Payer: Commercial Managed Care - PPO | Admitting: Clinical

## 2022-05-09 DIAGNOSIS — F331 Major depressive disorder, recurrent, moderate: Secondary | ICD-10-CM
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
## 2022-05-14 NOTE — Progress Notes (Unsigned)
Chief Complaint:   OBESITY Caitlin Barrett is here to discuss her progress with her obesity treatment plan along with follow-up of her obesity related diagnoses. Caitlin Barrett is on the Category 2 Plan and states she is following her eating plan approximately 85% of the time. Caitlin Barrett states she is strengthening and cardio 30 minutes 2-3 times per week.  Today's visit was #: 22 Starting weight: 277 LBS Starting date: 08/13/2019 Today's weight: 267 LBS Today's date: 05/02/2022 Total lbs lost to date: 10 LBS Total lbs lost since last in-office visit: +1 lb  Interim History: ***  Subjective:   1. Vitamin D deficiency 03/01/2022, vitamin D level 61.  Patient is taking twice weekly ergocalciferol.  Patient denies nausea, vomiting, muscle weakness.  Patient is also on OTC multivitamin, OTC vitamin D3 1000 IU daily.  2. Major depressive disorder, remission status unspecified, unspecified whether recurrent Before patient was taking duloxetine therapy she had diffuse pain, 7/10.  She currently has diffuse pain 5/10.  Patient's mood is stable.  Patient denies suicidal or homicidal ideations.  Assessment/Plan:   1. Vitamin D deficiency Check labs then MyChart patient with results and we will refill ergocalciferol as appropriate.  - VITAMIN D 25 Hydroxy (Vit-D Deficiency, Fractures)  2. Major depressive disorder, remission status unspecified, unspecified whether recurrent Refill- DULoxetine HCl 40 MG CPEP; Take 1 capsule (40 mg total) by mouth daily.  Dispense: 90 capsule; Refill: 0  3. Morbid obesity (HCC)-start bmi 40.91  4. BMI 39.0-39.9,adult-current bmi 39.6 Caitlin Barrett is currently in the action stage of change. As such, her goal is to continue with weight loss efforts. She has agreed to the Category 2 Plan.   Exercise goals:  As is.  Behavioral modification strategies: increasing lean protein intake, decreasing simple carbohydrates, meal planning and cooking strategies, keeping healthy foods  in the home, and planning for success.  Caitlin Barrett has agreed to follow-up with our clinic in 4 weeks. She was informed of the importance of frequent follow-up visits to maximize her success with intensive lifestyle modifications for her multiple health conditions.   Objective:   Blood pressure 132/83, pulse 82, temperature 97.7 F (36.5 C), height 5' 9"$  (1.753 m), weight 268 lb (121.6 kg), last menstrual period 05/26/2016, SpO2 98 %. Body mass index is 39.58 kg/m.  General: Cooperative, alert, well developed, in no acute distress. HEENT: Conjunctivae and lids unremarkable. Cardiovascular: Regular rhythm.  Lungs: Normal work of breathing. Neurologic: No focal deficits.   Lab Results  Component Value Date   CREATININE 0.73 08/28/2021   BUN 13 08/28/2021   NA 140 08/28/2021   K 4.5 08/28/2021   CL 102 08/28/2021   CO2 25 08/28/2021   Lab Results  Component Value Date   ALT 9 08/28/2021   AST 11 08/28/2021   ALKPHOS 57 08/28/2021   BILITOT 0.4 08/28/2021   Lab Results  Component Value Date   HGBA1C 5.2 08/28/2021   HGBA1C 5.3 05/03/2021   HGBA1C 5.3 12/29/2020   HGBA1C 5.3 03/31/2020   HGBA1C 5.5 12/15/2019   Lab Results  Component Value Date   INSULIN 10.7 08/28/2021   INSULIN 17.5 05/03/2021   INSULIN 10.1 12/29/2020   INSULIN 8.9 06/09/2020   INSULIN 12.4 03/31/2020   Lab Results  Component Value Date   TSH 0.083 (L) 03/01/2022   Lab Results  Component Value Date   CHOL 207 (H) 03/01/2022   HDL 40 03/01/2022   LDLCALC 138 (H) 03/01/2022   TRIG 160 (H) 03/01/2022  CHOLHDL 5.2 (H) 03/01/2022   Lab Results  Component Value Date   VD25OH 45.2 05/02/2022   VD25OH 34.6 11/21/2021   VD25OH 34.9 08/28/2021   Lab Results  Component Value Date   WBC 10.3 03/01/2022   HGB 13.0 03/01/2022   HCT 37.7 03/01/2022   MCV 88 03/01/2022   PLT 382 03/01/2022   Lab Results  Component Value Date   IRON 64 08/13/2019   TIBC 333 08/13/2019   FERRITIN 30  08/13/2019   Attestation Statements:   Reviewed by clinician on day of visit: allergies, medications, problem list, medical history, surgical history, family history, social history, and previous encounter notes.  I, Davy Pique, RMA, am acting as Location manager for Mina Marble, NP.  I have reviewed the above documentation for accuracy and completeness, and I agree with the above. -  ***

## 2022-05-23 ENCOUNTER — Ambulatory Visit (INDEPENDENT_AMBULATORY_CARE_PROVIDER_SITE_OTHER): Payer: Commercial Managed Care - PPO | Admitting: Adult Health

## 2022-05-23 ENCOUNTER — Encounter (INDEPENDENT_AMBULATORY_CARE_PROVIDER_SITE_OTHER): Payer: Self-pay | Admitting: Adult Health

## 2022-05-23 DIAGNOSIS — Z6839 Body mass index (BMI) 39.0-39.9, adult: Secondary | ICD-10-CM

## 2022-05-23 DIAGNOSIS — E669 Obesity, unspecified: Secondary | ICD-10-CM

## 2022-05-23 DIAGNOSIS — E559 Vitamin D deficiency, unspecified: Secondary | ICD-10-CM | POA: Diagnosis not present

## 2022-05-23 DIAGNOSIS — E079 Disorder of thyroid, unspecified: Secondary | ICD-10-CM

## 2022-05-23 DIAGNOSIS — F329 Major depressive disorder, single episode, unspecified: Secondary | ICD-10-CM

## 2022-05-23 MED ORDER — DULOXETINE HCL 40 MG PO CPEP: 40.0000 mg | ORAL_CAPSULE | Freq: Every day | ORAL | 0 refills | Status: DC

## 2022-05-23 MED ORDER — VITAMIN D (ERGOCALCIFEROL) 1.25 MG (50000 UNIT) PO CAPS: ORAL_CAPSULE | ORAL | 0 refills | Status: DC

## 2022-05-23 MED ORDER — ZEPBOUND 2.5 MG/0.5ML ~~LOC~~ SOAJ: 2.5000 mg | SUBCUTANEOUS | 0 refills | Status: DC
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
## 2022-06-12 NOTE — Progress Notes (Deleted)
Office Visit Note  Patient: Caitlin Barrett             Date of Birth: 1970/11/23           MRN: [id number]             PCP: Smitty Cords, DO Referring: Saralyn Pilar * Visit Date: 06/21/2022 Occupation: @GUAROCC @  Subjective:  No chief complaint on file.   History of Present Illness: Caitlin Barrett is a 52 y.o. female ***     Activities of Daily Living:  Patient reports morning stiffness for *** {minute/hour:19697}.   Patient {ACTIONS;DENIES/REPORTS:21021675::"Denies"} nocturnal pain.  Difficulty dressing/grooming: {ACTIONS;DENIES/REPORTS:21021675::"Denies"} Difficulty climbing stairs: {ACTIONS;DENIES/REPORTS:21021675::"Denies"} Difficulty getting out of chair: {ACTIONS;DENIES/REPORTS:21021675::"Denies"} Difficulty using hands for taps, buttons, cutlery, and/or writing: {ACTIONS;DENIES/REPORTS:21021675::"Denies"}  No Rheumatology ROS completed.   PMFS History:  Patient Active Problem List   Diagnosis Date Noted   Depression 08/11/2021   Bilateral lower extremity edema 08/06/2021   Status post nasal septoplasty 03/30/2021   Colon cancer screening    Polyp of colon    Depressed mood 12/01/2020   Nausea without vomiting 11/07/2020   Upper respiratory tract infection 11/07/2020   Thyroid disease 06/09/2020   Cervical strain 04/25/2020   Fatigue 04/04/2020   GAD (generalized anxiety disorder) 02/08/2020   Pericardial pain 01/28/2020   Muscle soreness 01/28/2020   Mixed hyperlipidemia 11/25/2019   Elevated BP without diagnosis of hypertension 11/25/2019   Polyphagia 10/08/2019   Vitamin D deficiency 09/17/2019   Insulin resistance 09/17/2019   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   Class 3 severe obesity with serious comorbidity and body mass index (BMI) of 40.0 to 44.9 in adult (HCC) 09/17/2019   Exercise-induced asthma 04/22/2018   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  Takes allergy shots at [facility]   Anemia    Anxiety    Arthritis    Asthma    exercise induced--no inhalers   Back pain    Cancer (HCC) 2010   Basal cell skin cancer Lt eye, skin graft above Lt eye   Constipation    Costochondritis    Depression    Dysmenorrhea    Eczema    Edema, lower extremity    GERD (gastroesophageal reflux disease)    Headache    Hypothyroid    Nodules and Fine needle aspiration   IBS (irritable bowel syndrome)    Lab test positive for detection of COVID-19 virus    November 2020   Myalgia    Osteoarthritis    Painful menstrual periods    Palpitations    Plantar fasciitis    Shortness of breath    Sprain of carpal (joint) of wrist    Vitamin B 12 deficiency    Vitamin D deficiency     Family History  Problem Relation Age of Onset   Diabetes Mother    Hyperlipidemia Mother    Hypertension Mother    Sleep apnea Mother    Obesity Mother    Cancer Father  lung CA   Heart disease Maternal Grandmother    Stroke Maternal Grandmother    Hypertension Maternal Grandmother    Diabetes Maternal Grandmother    Heart disease Maternal Grandfather    Diabetes Maternal Grandfather    Heart attack Maternal Grandfather    Past Surgical History:  Procedure Laterality Date   ABDOMINAL HYSTERECTOMY Bilateral 07/02/2016   Procedure: HYSTERECTOMY ABDOMINAL WITH BILATERAL SALPINGECTOMY-MIDLINE INCISION;  Surgeon: Herold Harms, MD;  Location: [facility] OR[facility];  Service: Gynecology;  Laterality: Bilateral;    CHOLECYSTECTOMY  2009   COLONOSCOPY WITH PROPOFOL N/A 01/26/2021   Procedure: COLONOSCOPY WITH PROPOFOL;  Surgeon: Toney Reil, MD;  Location: [facility];  Service: Gastroenterology;  Laterality: N/A;   fibromalga     HERNIA REPAIR  1974   umbilical   NASAL SEPTOPLASTY W/ TURBINOPLASTY Bilateral 03/03/2021   Procedure: NASAL SEPTOPLASTY WITH SUBMUCOSAL RESECTION OF TURBINATE;  Surgeon: Linus Salmons, MD;  Location: [facility];  Service: ENT;  Laterality: Bilateral;   SKIN GRAFT  2010   TONSILLECTOMY     Social History   Social History Narrative   Not on file   Immunization History  Administered Date(s) Administered   Influenza Inj Mdck Quad Pf 01/29/2019   Influenza Nasal 01/16/2018   Influenza-Unspecified 01/25/2020   Tdap 06/14/2017     Objective: Vital Signs: LMP 05/26/2016 (Exact Date)    Physical Exam   Musculoskeletal Exam: ***  CDAI Exam: CDAI Score: -- Patient Global: --; Provider Global: -- Swollen: --; Tender: -- Joint Exam 06/21/2022   No joint exam has been documented for this visit   There is currently no information documented on the homunculus. Go to the Rheumatology activity and complete the homunculus joint exam.  Investigation: No additional findings.  Imaging: No results found.  Recent Labs: Lab Results  Component Value Date   WBC 10.3 03/01/2022   HGB 13.0 03/01/2022   PLT 382 03/01/2022   NA 140 08/28/2021   K 4.5 08/28/2021   CL 102 08/28/2021   CO2 25 08/28/2021   GLUCOSE 95 08/28/2021   BUN 13 08/28/2021   CREATININE 0.73 08/28/2021   BILITOT 0.4 08/28/2021   ALKPHOS 57 08/28/2021   AST 11 08/28/2021   ALT 9 08/28/2021   PROT 6.6 08/28/2021   ALBUMIN 4.3 08/28/2021   CALCIUM 9.3 08/28/2021   GFRAA 89 03/31/2020    Speciality Comments: No specialty comments available.  Procedures:  No procedures performed Allergies: Iodinated contrast media and Codeine   Assessment / Plan:     Visit Diagnoses:  Myofascial pain  Chronic right shoulder pain  Trochanteric bursitis of both hips  Chronic SI joint pain  Primary osteoarthritis of left knee  Morton's neuroma of right foot  Basal cell carcinoma (BCC) of skin of other part of face  Exercise-induced asthma  Anxiety with depression  Central hypothyroidism  History of IBS  Family history of psoriasis in sister  Orders: No orders of the defined types were placed in this encounter.  No orders of the defined types were placed in this encounter.   Face-to-face time spent with patient was *** minutes. Greater than 50% of time was spent in counseling and coordination of care.  Follow-Up Instructions: No follow-ups on file.   Gearldine Bienenstock, PA-C  Note - This record has been created using Dragon software.  Chart creation errors have been sought, but may not always  have been located. Such creation errors do not reflect on  the standard of  medical care.,

## 2022-06-20 ENCOUNTER — Ambulatory Visit (INDEPENDENT_AMBULATORY_CARE_PROVIDER_SITE_OTHER): Payer: Commercial Managed Care - PPO | Admitting: Physician Assistant

## 2022-06-20 ENCOUNTER — Encounter (INDEPENDENT_AMBULATORY_CARE_PROVIDER_SITE_OTHER): Payer: Self-pay | Admitting: Physician Assistant

## 2022-06-20 VITALS — BP 131/82 | HR 78 | Temp 97.9°F | Ht 69.0 in | Wt 266.0 lb

## 2022-06-20 DIAGNOSIS — E88819 Insulin resistance, unspecified: Secondary | ICD-10-CM | POA: Diagnosis not present

## 2022-06-20 DIAGNOSIS — E559 Vitamin D deficiency, unspecified: Secondary | ICD-10-CM | POA: Diagnosis not present

## 2022-06-20 DIAGNOSIS — E079 Disorder of thyroid, unspecified: Secondary | ICD-10-CM

## 2022-06-20 DIAGNOSIS — Z6839 Body mass index (BMI) 39.0-39.9, adult: Secondary | ICD-10-CM

## 2022-06-20 MED ORDER — VITAMIN D (ERGOCALCIFEROL) 1.25 MG (50000 UNIT) PO CAPS: ORAL_CAPSULE | ORAL | 0 refills | Status: DC
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
## 2022-06-20 NOTE — Assessment & Plan Note (Signed)
Insulin Resistance Last fasting insulin was 10.7. A1c was 5.2. Polyphagia:No Medication(s): None Lab Results  Component Value Date   HGBA1C 5.2 08/28/2021   HGBA1C 5.3 05/03/2021   HGBA1C 5.3 12/29/2020   HGBA1C 5.3 03/31/2020   HGBA1C 5.5 12/15/2019   Lab Results  Component Value Date   INSULIN 10.7 08/28/2021   INSULIN 17.5 05/03/2021   INSULIN 10.1 12/29/2020   INSULIN 8.9 06/09/2020   INSULIN 12.4 03/31/2020    Plan: Continue working on nutrition plan to decrease simple carbohydrates, increase lean proteins and exercise to promote weight loss, improve glycemic control and prevent progression to Type 2 diabetes.  Increase protein intake. Increase fiber intake.

## 2022-06-20 NOTE — Assessment & Plan Note (Addendum)
Vitamin D Deficiency Vitamin D is not at goal of 50.  Most recent vitamin D level was 45.2. She is on  prescription ergocalciferol 50,000 IU twice weekly. No side effects with ergocalciferol.    Lab Results  Component Value Date   VD25OH 45.2 05/02/2022   VD25OH 34.6 11/21/2021   VD25OH 34.9 08/28/2021    Plan: Refill prescription vitamin D 50,000 IU twice weekly. Will check vitamin D level at least 2-3 times yearly to avoid over supplementation.

## 2022-06-21 ENCOUNTER — Ambulatory Visit: Payer: No Typology Code available for payment source | Admitting: Rheumatology

## 2022-06-21 DIAGNOSIS — G5761 Lesion of plantar nerve, right lower limb: Secondary | ICD-10-CM

## 2022-06-21 DIAGNOSIS — F418 Other specified anxiety disorders: Secondary | ICD-10-CM

## 2022-06-21 DIAGNOSIS — M7918 Myalgia, other site: Secondary | ICD-10-CM

## 2022-06-21 DIAGNOSIS — J45990 Exercise induced bronchospasm: Secondary | ICD-10-CM

## 2022-06-21 DIAGNOSIS — M7061 Trochanteric bursitis, right hip: Secondary | ICD-10-CM

## 2022-06-21 DIAGNOSIS — E038 Other specified hypothyroidism: Secondary | ICD-10-CM

## 2022-06-21 DIAGNOSIS — Z8719 Personal history of other diseases of the digestive system: Secondary | ICD-10-CM

## 2022-06-21 DIAGNOSIS — C44319 Basal cell carcinoma of skin of other parts of face: Secondary | ICD-10-CM

## 2022-06-21 DIAGNOSIS — M1712 Unilateral primary osteoarthritis, left knee: Secondary | ICD-10-CM

## 2022-06-21 DIAGNOSIS — Z840 Family history of diseases of the skin and subcutaneous tissue: Secondary | ICD-10-CM

## 2022-06-21 DIAGNOSIS — G8929 Other chronic pain: Secondary | ICD-10-CM

## 2022-06-27 NOTE — Progress Notes (Unsigned)
Office Visit Note  Patient: Caitlin Barrett             Date of Birth: January 05, 1971           MRN: [id number]             PCP: Smitty Cords, DO Referring: Saralyn Pilar * Visit Date: 07/11/2022 Occupation: @GUAROCC @  Subjective:  Pain in multiple joints   History of Present Illness: Caitlin Barrett is a 52 y.o. female with history of myofascial pain and osteoarthritis.  Patient reports that she increased the dose of Cymbalta from 30 mg to 40 mg daily starting about 6 to 8 weeks ago.  She has been tolerating the increased dose of Cymbalta without any side effects.  She initially had noticed some improvement in the myofascial pain but is unsure how much benefit she has had over time with the increased dose of Cymbalta.  She continues to have generalized myalgias and muscle tenderness due to myofascial pain.  She has had some increase stiffness in her mid to lower back as well.  She has ongoing discomfort in both knee joints especially when climbing steps.  She denies any joint swelling.  She is having intermittent discomfort in the trochanteric bursa bilaterally.  She takes Tylenol and Advil as needed for symptomatic relief.  She is also been taking turmeric for the natural anti-inflammatory properties.   Activities of Daily Living:  Patient reports morning stiffness for 45-60 minutes.   Patient Reports nocturnal pain.  Difficulty dressing/grooming: Denies Difficulty climbing stairs: Reports Difficulty getting out of chair: Reports Difficulty using hands for taps, buttons, cutlery, and/or writing: Denies  Review of Systems  Constitutional:  Positive for fatigue.  HENT:  Positive for mouth dryness. Negative for mouth sores.   Eyes:  Negative for dryness.  Respiratory:  Negative for shortness of breath.   Cardiovascular:  Negative for chest pain and palpitations.  Gastrointestinal:  Positive for constipation. Negative for blood in stool and diarrhea.  Endocrine:  Negative for increased urination.  Genitourinary:  Negative for involuntary urination.  Musculoskeletal:  Positive for joint pain, joint pain, myalgias, morning stiffness, muscle tenderness and myalgias. Negative for gait problem, joint swelling and muscle weakness.  Skin:  Negative for color change, rash, hair loss and sensitivity to sunlight.  Allergic/Immunologic: Positive for susceptible to infections.  Neurological:  Positive for headaches. Negative for dizziness.  Hematological:  Negative for swollen glands.  Psychiatric/Behavioral:  Positive for depressed mood and sleep disturbance. The patient is nervous/anxious.     PMFS History:  Patient Active Problem List   Diagnosis Date Noted   BMI 39.0-39.9,adult-current bmi 39.4 06/20/2022   Morbid obesity (HCC)-start bmi 40.91 06/20/2022   Depression 08/11/2021   Bilateral lower extremity edema 08/06/2021   Status post nasal septoplasty 03/30/2021   Colon cancer screening    Polyp of colon    Depressed mood 12/01/2020   Nausea without vomiting 11/07/2020   Upper respiratory tract infection 11/07/2020   Thyroid disease 06/09/2020   Cervical strain 04/25/2020   Fatigue 04/04/2020   GAD (generalized anxiety disorder) 02/08/2020   Pericardial pain 01/28/2020   Muscle soreness 01/28/2020   Mixed hyperlipidemia 11/25/2019   Elevated BP without diagnosis of hypertension 11/25/2019   Polyphagia 10/08/2019   Vitamin D deficiency 09/17/2019   Insulin resistance 09/17/2019   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   Class 3 severe obesity with serious comorbidity and body mass index (BMI) of 40.0 to 44.9 in adult 09/17/2019  Exercise-induced asthma 04/22/2018   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  Takes allergy shots at [facility]   Anemia    Anxiety    Arthritis    Asthma    exercise induced--no inhalers   Back pain    Cancer 2010   Basal cell skin cancer Lt eye, skin graft above Lt eye   Constipation    Costochondritis    Depression    Dysmenorrhea    Eczema    Edema, lower extremity    GERD (gastroesophageal reflux disease)    Headache    Hypothyroid    Nodules and Fine needle aspiration   IBS (irritable bowel syndrome)    Lab test positive for detection of COVID-19 virus    November 2020   Myalgia    Osteoarthritis    Painful menstrual periods    Palpitations    Plantar fasciitis    Shortness of breath    Sprain of carpal (joint) of wrist    Vitamin B 12 deficiency    Vitamin D deficiency     Family History  Problem Relation Age of Onset   Diabetes Mother    Hyperlipidemia Mother    Hypertension Mother    Sleep apnea Mother    Obesity Mother    Cancer Father        lung CA   Heart disease Maternal Grandmother    Stroke Maternal Grandmother    Hypertension Maternal Grandmother    Diabetes Maternal Grandmother    Heart disease Maternal Grandfather    Diabetes Maternal Grandfather    Heart attack Maternal Grandfather    Past Surgical History:  Procedure Laterality Date   ABDOMINAL HYSTERECTOMY Bilateral 07/02/2016   Procedure: HYSTERECTOMY ABDOMINAL WITH BILATERAL SALPINGECTOMY-MIDLINE INCISION;  Surgeon: Herold Harms, MD;  Location: [facility];  Service: Gynecology;  Laterality: Bilateral;   CHOLECYSTECTOMY  2009   COLONOSCOPY WITH PROPOFOL N/A 01/26/2021   Procedure: COLONOSCOPY WITH PROPOFOL;  Surgeon: Toney Reil, MD;  Location: [facility];  Service:  Gastroenterology;  Laterality: N/A;   fibromalga     HERNIA REPAIR  1974   umbilical   NASAL SEPTOPLASTY W/ TURBINOPLASTY Bilateral 03/03/2021   Procedure: NASAL SEPTOPLASTY WITH SUBMUCOSAL RESECTION OF TURBINATE;  Surgeon: Linus Salmons, MD;  Location: [facility];  Service: ENT;  Laterality: Bilateral;   SKIN GRAFT  2010   TONSILLECTOMY     Social History   Social History Narrative   Not on file   Immunization History  Administered Date(s) Administered   Influenza Inj Mdck Quad Pf 01/29/2019   Influenza Nasal 01/16/2018   Influenza-Unspecified 01/25/2020   Tdap 06/14/2017     Objective: Vital Signs: BP 139/81 (BP Location: Left Arm, Patient Position: Sitting, Cuff Size: Large)   Pulse 80   Resp 17   Ht 5\' 9"  (1.753 m)   Wt 281 lb (127.5 kg)   LMP 05/26/2016 (Exact Date)   BMI 41.50 kg/m    Physical Exam Vitals and nursing note  reviewed.  Constitutional:      Appearance: She is well-developed.  HENT:     Head: Normocephalic and atraumatic.  Eyes:     Conjunctiva/sclera: Conjunctivae normal.  Cardiovascular:     Rate and Rhythm: Normal rate and regular rhythm.     Heart sounds: Normal heart sounds.  Pulmonary:     Effort: Pulmonary effort is normal.     Breath sounds: Normal breath sounds.  Abdominal:     General: Bowel sounds are normal.     Palpations: Abdomen is soft.  Musculoskeletal:     Cervical back: Normal range of motion.  Lymphadenopathy:     Cervical: No cervical adenopathy.  Skin:    General: Skin is warm and dry.     Capillary Refill: Capillary refill takes less than 2 seconds.  Neurological:     Mental Status: She is alert and oriented to person, place, and time.  Psychiatric:        Behavior: Behavior normal.      Musculoskeletal Exam: Generalized hyperalgesia and positive tender points noted on examination today.  C-spine has good range of motion.  Trapezius muscle tension and tenderness bilaterally.  Midline spinal tenderness  in the thoracic and lumbar region.  Some tenderness of the intercostal muscles.  Shoulder joints, elbow joints, wrist joints, MCPs, PIPs, DIPs have good range of motion with no synovitis.  Complete fist formation bilaterally.  Hip joints have good range of motion with no groin pain.  Some tenderness over the right trochanteric bursa.  Knee joints have good range of motion with no warmth or effusion.  Ankle joints have good range of motion with no tenderness or joint swelling.  CDAI Exam: CDAI Score: -- Patient Global: --; Provider Global: -- Swollen: --; Tender: -- Joint Exam 07/11/2022   No joint exam has been documented for this visit   There is currently no information documented on the homunculus. Go to the Rheumatology activity and complete the homunculus joint exam.  Investigation: No additional findings.  Imaging: No results found.  Recent Labs: Lab Results  Component Value Date   WBC 10.3 03/01/2022   HGB 13.0 03/01/2022   PLT 382 03/01/2022   NA 140 08/28/2021   K 4.5 08/28/2021   CL 102 08/28/2021   CO2 25 08/28/2021   GLUCOSE 95 08/28/2021   BUN 13 08/28/2021   CREATININE 0.73 08/28/2021   BILITOT 0.4 08/28/2021   ALKPHOS 57 08/28/2021   AST 11 08/28/2021   ALT 9 08/28/2021   PROT 6.6 08/28/2021   ALBUMIN 4.3 08/28/2021   CALCIUM 9.3 08/28/2021   GFRAA 89 03/31/2020    Speciality Comments: No specialty comments available.  Procedures:  No procedures performed Allergies: Iodinated contrast media and Codeine   Assessment / Plan:     Visit Diagnoses: Myofascial pain - Autoimmune work-up negative 04/28/2019: Patient continues to experience intermittent myalgias and muscle tenderness consistent with myofascial pain syndrome.  After her last office visit the recommendation was to try increasing the dose of Cymbalta since she was only taking 30 mg daily.  She reached out to her PCP and has increased the dose of Cymbalta to 40 mg daily for the past 6 to 8 weeks.  She  initially noticed some improvement in her myofascial pain and has been tolerating Cymbalta without any side effects.  Discussed that she may benefit from increasing Cymbalta to 60 mg daily if needed. She has not yet tried aquatic therapy.  She is been taking turmeric for  the natural anti-inflammatory properties.  Discussed the other natural anti-inflammatories recommended including ginger, tart cherry, and omega-3. Discussed the importance of regular exercise and good sleep hygiene.  Chronic right shoulder pain: Range of motion of the right shoulder joint noted on examination today.  Trochanteric bursitis of both hips: She has intermittent discomfort in both hips due to trochanteric bursitis.  She has some tenderness over the right trochanter bursa on examination today.  No groin pain currently.  Patient was encouraged to perform stretching exercises daily.  Offered referral to physical therapy but she declined at this time.  She will notify us if her symptoms persist or worsen.  Chronic SI joint pain: No SI joint tenderness upon palpation today.  She has not been experiencing nocturnal pain in the SI joints.  Chronic midline low back pain without sciatica: She has been experiencing increased discomfort in the mid to lower back.  She describes the sensation as a stiffness in her back.  Her discomfort typically improves with walking or stretching exercises.  She has not been experiencing any consistent nocturnal pain.  No symptoms of radiculopathy.  Patient declined updated x-rays today.  She declined referral to physical therapy.  Discussed the use of topical agents including Salonpas patches for pain relief.  Discussed the use of a heating pad.  Discussed the importance of core strengthening and performing back exercises.  She was given a handout of exercises to perform.  She was advised to notify us if her symptoms persist or worsen at which time we can also refer her to a spine specialist if  needed.  Primary osteoarthritis of left knee - Moderate osteoarthritis and moderate chondromalacia patella.  Evaluated at [facility] in the past.  She has some discomfort in the knee joints intermittently.  Her discomfort is exacerbated by climbing up and down steps.  On examination she has good range of motion of both knee joints with no warmth or effusion.  In the past we have discussed proceeding with cortisone injections, Visco gel injections, or aquatic/physical therapy she has declined.  She will notify us if her symptoms persist or worsen.  Other medical conditions are listed as follows:  Morton's neuroma of right foot: Not currently symptomatic.  Basal cell carcinoma (BCC) of skin of other part of face  Exercise-induced asthma  Anxiety with depression  Central hypothyroidism  History of IBS  Family history of psoriasis in sister  Orders: No orders of the defined types were placed in this encounter.  No orders of the defined types were placed in this encounter.    Follow-Up Instructions: Return in about 6 months (around 01/10/2023) for Myofascial pain, Osteoarthritis.   Gearldine Bienenstock, PA-C  Note - This record has been created using Dragon software.  Chart creation errors have been sought, but may not always  have been located. Such creation errors do not reflect on  the standard of medical care.

## 2022-06-30 ENCOUNTER — Other Ambulatory Visit (INDEPENDENT_AMBULATORY_CARE_PROVIDER_SITE_OTHER): Payer: Self-pay | Admitting: Adult Health

## 2022-06-30 DIAGNOSIS — E559 Vitamin D deficiency, unspecified: Secondary | ICD-10-CM

## 2022-07-05 ENCOUNTER — Ambulatory Visit: Payer: Commercial Managed Care - PPO | Admitting: Clinical

## 2022-07-11 ENCOUNTER — Encounter: Payer: Self-pay | Admitting: Physician Assistant

## 2022-07-11 ENCOUNTER — Ambulatory Visit: Payer: Commercial Managed Care - PPO | Attending: Rheumatology | Admitting: Physician Assistant

## 2022-07-11 VITALS — BP 139/81 | HR 80 | Resp 17 | Ht 69.0 in | Wt 281.0 lb

## 2022-07-11 DIAGNOSIS — C44319 Basal cell carcinoma of skin of other parts of face: Secondary | ICD-10-CM

## 2022-07-11 DIAGNOSIS — M7918 Myalgia, other site: Secondary | ICD-10-CM | POA: Diagnosis not present

## 2022-07-11 DIAGNOSIS — M545 Low back pain, unspecified: Secondary | ICD-10-CM

## 2022-07-11 DIAGNOSIS — E038 Other specified hypothyroidism: Secondary | ICD-10-CM

## 2022-07-11 DIAGNOSIS — G8929 Other chronic pain: Secondary | ICD-10-CM

## 2022-07-11 DIAGNOSIS — M7062 Trochanteric bursitis, left hip: Secondary | ICD-10-CM

## 2022-07-11 DIAGNOSIS — M7061 Trochanteric bursitis, right hip: Secondary | ICD-10-CM | POA: Diagnosis not present

## 2022-07-11 DIAGNOSIS — M25511 Pain in right shoulder: Secondary | ICD-10-CM | POA: Diagnosis not present

## 2022-07-11 DIAGNOSIS — J45990 Exercise induced bronchospasm: Secondary | ICD-10-CM

## 2022-07-11 DIAGNOSIS — F418 Other specified anxiety disorders: Secondary | ICD-10-CM

## 2022-07-11 DIAGNOSIS — M533 Sacrococcygeal disorders, not elsewhere classified: Secondary | ICD-10-CM

## 2022-07-11 DIAGNOSIS — M1712 Unilateral primary osteoarthritis, left knee: Secondary | ICD-10-CM

## 2022-07-11 DIAGNOSIS — G5761 Lesion of plantar nerve, right lower limb: Secondary | ICD-10-CM

## 2022-07-11 DIAGNOSIS — Z840 Family history of diseases of the skin and subcutaneous tissue: Secondary | ICD-10-CM

## 2022-07-11 DIAGNOSIS — Z8719 Personal history of other diseases of the digestive system: Secondary | ICD-10-CM

## 2022-07-11 NOTE — Patient Instructions (Signed)
Back Exercises The following exercises strengthen the muscles that help to support the trunk (torso) and back. They also help to keep the lower back flexible. Doing these exercises can help to prevent or lessen existing low back pain. If you have back pain or discomfort, try doing these exercises 2-3 times each day or as told by your health care provider. As your pain improves, do them once each day, but increase the number of times that you repeat the steps for each exercise (do more repetitions). To prevent the recurrence of back pain, continue to do these exercises once each day or as told by your health care provider. Do exercises exactly as told by your health care provider and adjust them as directed. It is normal to feel mild stretching, pulling, tightness, or discomfort as you do these exercises, but you should stop right away if you feel sudden pain or your pain gets worse. Exercises Single knee to chest Repeat these steps 3-5 times for each leg: Lie on your back on a firm bed or the floor with your legs extended. Bring one knee to your chest. Your other leg should stay extended and in contact with the floor. Hold your knee in place by grabbing your knee or thigh with both hands and hold. Pull on your knee until you feel a gentle stretch in your lower back or buttocks. Hold the stretch for 10-30 seconds. Slowly release and straighten your leg.  Pelvic tilt Repeat these steps 5-10 times: Lie on your back on a firm bed or the floor with your legs extended. Bend your knees so they are pointing toward the ceiling and your feet are flat on the floor. Tighten your lower abdominal muscles to press your lower back against the floor. This motion will tilt your pelvis so your tailbone points up toward the ceiling instead of pointing to your feet or the floor. With gentle tension and even breathing, hold this position for 5-10 seconds.  Cat-cow Repeat these steps until your lower back becomes  more flexible: Get into a hands-and-knees position on a firm bed or the floor. Keep your hands under your shoulders, and keep your knees under your hips. You may place padding under your knees for comfort. Let your head hang down toward your chest. Contract your abdominal muscles and point your tailbone toward the floor so your lower back becomes rounded like the back of a cat. Hold this position for 5 seconds. Slowly lift your head, let your abdominal muscles relax, and point your tailbone up toward the ceiling so your back forms a sagging arch like the back of a cow. Hold this position for 5 seconds.  Press-ups Repeat these steps 5-10 times: Lie on your abdomen (face-down) on a firm bed or the floor. Place your palms near your head, about shoulder-width apart. Keeping your back as relaxed as possible and keeping your hips on the floor, slowly straighten your arms to raise the top half of your body and lift your shoulders. Do not use your back muscles to raise your upper torso. You may adjust the placement of your hands to make yourself more comfortable. Hold this position for 5 seconds while you keep your back relaxed. Slowly return to lying flat on the floor.  Bridges Repeat these steps 10 times: Lie on your back on a firm bed or the floor. Bend your knees so they are pointing toward the ceiling and your feet are flat on the floor. Your arms should be flat   at your sides, next to your body. Tighten your buttocks muscles and lift your buttocks off the floor until your waist is at almost the same height as your knees. You should feel the muscles working in your buttocks and the back of your thighs. If you do not feel these muscles, slide your feet 1-2 inches (2.5-5 cm) farther away from your buttocks. Hold this position for 3-5 seconds. Slowly lower your hips to the starting position, and allow your buttocks muscles to relax completely. If this exercise is too easy, try doing it with your arms  crossed over your chest. Abdominal crunches Repeat these steps 5-10 times: Lie on your back on a firm bed or the floor with your legs extended. Bend your knees so they are pointing toward the ceiling and your feet are flat on the floor. Cross your arms over your chest. Tip your chin slightly toward your chest without bending your neck. Tighten your abdominal muscles and slowly raise your torso high enough to lift your shoulder blades a tiny bit off the floor. Avoid raising your torso higher than that because it can put too much stress on your lower back and does not help to strengthen your abdominal muscles. Slowly return to your starting position.  Back lifts Repeat these steps 5-10 times: Lie on your abdomen (face-down) with your arms at your sides, and rest your forehead on the floor. Tighten the muscles in your legs and your buttocks. Slowly lift your chest off the floor while you keep your hips pressed to the floor. Keep the back of your head in line with the curve in your back. Your eyes should be looking at the floor. Hold this position for 3-5 seconds. Slowly return to your starting position.  Contact a health care provider if: Your back pain or discomfort gets much worse when you do an exercise. Your worsening back pain or discomfort does not lessen within 2 hours after you exercise. If you have any of these problems, stop doing these exercises right away. Do not do them again unless your health care provider says that you can. Get help right away if: You develop sudden, severe back pain. If this happens, stop doing the exercises right away. Do not do them again unless your health care provider says that you can.   Core Strength Exercises Ask your health care provider which exercises are safe for you. Do exercises exactly as told by your health care provider and adjust them as directed. It is normal to feel mild stretching, pulling, tightness, or discomfort as you do these  exercises. Stop right away if you feel sudden pain or your pain gets worse. Do not begin these exercises until told by your health care provider. Benefits of core strength exercises Core exercises help to build strength in the muscles between your ribs and your hips (abdominal muscles). These muscles help to support your body and keep your spine stable. It is important to maintain strength in your core to prevent injury and pain. Some activities, such as yoga and Pilates, can help to strengthen core muscles. You can also strengthen core muscles with exercises at home. It is important to talk to your health care provider before you start a new exercise routine. Core strength exercises can: Reduce back pain. Help to rebuild strength after a back or spine injury. Help to prevent injury during physical activity, especially injuries to the back, hips, and knees. How to do core strength exercises Repeat these exercises 10-15 times,  or until you are tired. Stop if you feel any pain while doing these exercises. Contact your health care provider if your pain continues or gets worse while doing or after doing core strength exercises. For strength exercises that are done on the floor, use a padded yoga mat or an exercise mat. Bridging  Lie on your back on a firm surface with your knees bent and your feet flat on the floor. Raise your hips so that your knees, hips, and shoulders together form a straight line. Do not excessively arch your back. Keep your abdominal muscles tight. Hold this position for 3-5 seconds. Slowly lower your hips to the starting position. Let your muscles relax completely between repetitions. Single-leg bridge  Lie on your back on a firm surface with your knees bent and your feet flat on the floor. Raise your hips so that your knees, hips, and shoulders together form a straight line. Do not excessively arch your back. Keep your abdominal muscles tight. Lift one foot off the floor  while maintaining alignment in your knees, hips, and shoulders. Then, completely straighten the lifted leg. Hold this position for 3-5 seconds. Put the straight leg back down in the bent position. Slowly lower your hips to the starting position. Repeat these steps using your other leg. Side bridge  Lie on your side with your knees bent. Prop yourself up on the elbow that is near the floor. Using your abdominal muscles and the elbow you are propped up on, raise your body off the floor. Raise your hip so that your shoulder, hip, and foot together form a straight line. Hold this position for 10 seconds. Keep your head and neck raised and away from your shoulder (in their normal, neutral position). Keep your abdominal muscles tight. Slowly lower your hip to the starting position. Repeat and try to hold this position longer, working your way up to 30 seconds. Abdominal crunch  Lie on your back on a firm surface. Bend your knees and keep your feet flat on the floor. Cross your arms over your chest. Without bending your neck, tip your chin slightly toward your chest. Tighten your abdominal muscles as you lift your chest just high enough to lift your shoulder blades off the floor. Do not hold your breath. You can do this with short lifts or long lifts. Slowly return to the starting position. Bird dog  Get on your hands and knees, with your legs shoulder-width apart and your arms under your shoulders. Keep your back straight. Tighten your abdominal muscles. Raise one of your legs off the floor and straighten it. Try to keep it parallel to the floor. Slowly lower your leg to the starting position. Raise one of your arms off the floor and straighten it. Try to keep it parallel to the floor. Slowly lower your arm to the starting position. Repeat with the other arm and leg. If possible, try raising a leg and an arm at the same time, on opposite sides of the body. For example, raise your left hand and  your right leg. Raford Pitcher on your belly. Prop up your body onto your forearms and your feet, keeping your legs straight. Your body should make a straight line between your shoulders and feet. Hold this position for 10 seconds while keeping your abdominal muscles tight. Lower your body to the starting position. Repeat and try to hold this position longer, working your way up to 30 seconds. Cross-core strengthening  Stand with your feet shoulder-width  apart. Hold a ball out in front of you. Keep your arms straight. Tighten your abdominal muscles and slowly rotate at your waist from side to side. Keep your feet flat. Once you are comfortable, try repeating this exercise with a heavier ball. Top core strengthening  Stand about 18 inches (46 cm) out from a wall, with your back to the wall. Keep your feet flat and shoulder-width apart. Tighten your abdominal muscles. Bend your hips and knees. Slowly reach between your legs to touch the wall behind you. Slowly stand back up. Raise your arms over your head and reach behind you. Return to the starting position. General tips Do not do any exercises that cause pain. If you have pain while exercising, talk to your health care provider. Always stretch before and after doing these exercises. This can help prevent injury. Maintain a healthy weight. Ask your health care provider what weight is healthy for you. Contact a health care provider if: You have back pain that gets worse or does not go away. You feel pain while doing core strength exercises. Get help right away if: You have severe pain that does not get better with medicine. Summary Core exercises help to build strength in the muscles between your ribs and your waist. Core muscles help to support your body and keep your spine stable. Some activities, such as yoga and Pilates, can help to strengthen core muscles. Core strength exercises can help back pain and can prevent injury. Stop if  you feel any pain while doing core strength exercises. This information is not intended to replace advice given to you by your health care provider. Make sure you discuss any questions you have with your health care provider. Document Revised: 09/06/2020 Document Reviewed: 12/08/2019 Elsevier Patient Education  2023 [employer].

## 2022-07-18 ENCOUNTER — Ambulatory Visit (INDEPENDENT_AMBULATORY_CARE_PROVIDER_SITE_OTHER): Payer: Commercial Managed Care - PPO | Admitting: Adult Health

## 2022-07-18 ENCOUNTER — Encounter (INDEPENDENT_AMBULATORY_CARE_PROVIDER_SITE_OTHER): Payer: Self-pay | Admitting: Adult Health

## 2022-07-18 VITALS — BP 135/84 | HR 69 | Temp 97.8°F | Ht 69.0 in | Wt 270.0 lb

## 2022-07-18 DIAGNOSIS — E559 Vitamin D deficiency, unspecified: Secondary | ICD-10-CM

## 2022-07-18 DIAGNOSIS — M7918 Myalgia, other site: Secondary | ICD-10-CM

## 2022-07-18 DIAGNOSIS — Z6839 Body mass index (BMI) 39.0-39.9, adult: Secondary | ICD-10-CM | POA: Diagnosis not present

## 2022-07-18 DIAGNOSIS — E669 Obesity, unspecified: Secondary | ICD-10-CM | POA: Diagnosis not present

## 2022-07-18 MED ORDER — VITAMIN D (ERGOCALCIFEROL) 1.25 MG (50000 UNIT) PO CAPS: ORAL_CAPSULE | ORAL | 0 refills | Status: DC

## 2022-07-18 NOTE — Progress Notes (Signed)
WEIGHT SUMMARY AND BIOMETRICS  Vitals Temp: 97.8 F (36.6 C) BP: 135/84 Pulse Rate: 69 SpO2: 98 %   Anthropometric Measurements Height:  (1.753 m) Weight: 270 lb (122.5 kg) BMI (Calculated): 39.85 Weight at Last Visit: 266lb Weight Lost Since Last Visit: 0 Weight Gained Since Last Visit: 4lb Starting Weight: 277lb Total Weight Loss (lbs): 8 lb (3.629 kg)   Body Composition  Body Fat %: 46.6 % Fat Mass (lbs): 126 lbs Muscle Mass (lbs): 137 lbs Total Body Water (lbs): 90.8 lbs Visceral Fat Rating : 14   Other Clinical Data Fasting: no Labs: no Today's Visit #: 41 Starting Date: 08/13/19    Chief Complaint:   OBESITY Caitlin Barrett is here to discuss her progress with her obesity treatment plan. She is on the the Category 2 Plan and states she is following her eating plan approximately 60 % of the time.  She states she is exercising walking 30 minutes 3 times per week.   Interim History:  [name] reports acute GI illness early April that lasted 7-9 days. She experienced N/V/D. She followed BRAT diet and was unable to exercise. She denies GI upset since July 09, 2022 and has resumed walking and Cat 2 Meal Plan.  Reviewed Bioimpedance results with pt: Muscle Mass: + 2.4 lbs Adipose Mass: + 1.2 lbs  Of Note- Previously trialed on Metformin therapy- unable to tolerate due to bilateral lower extremity edema. She denies family hx of MEN 2 or MTC. She denies personal hx of pancreatitis. Currently her insurance will not cover GLP-1 or GIP/GLP-1 therapy for Obesity tx.  Subjective:   1. Myofascial pain 07/11/22 Rheumatology OV Notes: History of Present Illness: Caitlin Barrett is a 52 y.o. female with history of myofascial pain and osteoarthritis.  Patient reports that she increased the dose of Cymbalta from 30 mg to 40 mg daily starting about 6 to 8 weeks ago.  She has been tolerating the increased dose of Cymbalta without any side effects.  She  initially had noticed some improvement in the myofascial pain but is unsure how much benefit she has had over time with the increased dose of Cymbalta.  She continues to have generalized myalgias and muscle tenderness due to myofascial pain.  She has had some increase stiffness in her mid to lower back as well.  She has ongoing discomfort in both knee joints especially when climbing steps.  She denies any joint swelling.  She is having intermittent discomfort in the trochanteric bursa bilaterally.  She takes Tylenol and Advil as needed for symptomatic relief.  She is also been taking turmeric for the natural anti-inflammatory properties.   2. Vitamin D deficiency  Latest Reference Range & Units 05/03/21 08:03 08/28/21 10:07 11/21/21 08:06 05/02/22 08:03  Vitamin D, 25-Hydroxy 30.0 - 100.0 ng/mL 50.5 34.9 34.6 45.2  She is currently on twice weekly Ergocalciferol- denies N/V/Muscle Weakness. She endorses fatigue, however believes that to be associated with recent GI illness.    Assessment/Plan:   1. Myofascial pain [facility] is MANAGING Cymbalta therapy- currently on  QD  2. Vitamin D deficiency Refill - Vitamin D, Ergocalciferol, (DRISDOL) 1.25 MG (50000 UNIT) CAPS capsule; TAKE ONE CAPSULE BY MOUTH TWICE PER WEEK  Dispense: 16 capsule; Refill: 0 Check labs this spring  3. BMI 39.0-39.9,adult-current BMI 39.85  [name] is currently in the action stage of change. As such, her goal is to continue with weight loss efforts. She has agreed to the Category 2 Plan.  Exercise goals: All adults should avoid inactivity. Some physical activity is better than none, and adults who participate in any amount of physical activity gain some health benefits. For additional and more extensive health benefits, adults should increase their aerobic physical activity to 300 minutes (5 hours) a week of moderate-intensity, or 150 minutes a week of vigorous-intensity aerobic physical activity, or an equivalent  combination of moderate- and vigorous-intensity activity. Additional health benefits are gained by engaging in physical activity beyond this amount.  Adults should also include muscle-strengthening activities that involve all major muscle groups on 2 or more days a week.  Behavioral modification strategies: increasing lean protein intake, decreasing simple carbohydrates, increasing vegetables, increasing water intake, no skipping meals, meal planning and cooking strategies, keeping healthy foods in the home, better snacking choices, and planning for success.  [name] has agreed to follow-up with our clinic in 4 weeks. She was informed of the importance of frequent follow-up visits to maximize her success with intensive lifestyle modifications for her multiple health conditions.   Objective:   Blood pressure 135/84, pulse 69, temperature 97.8 F (36.6 C), height  (1.753 m), weight 270 lb (122.5 kg), last menstrual period 05/26/2016, SpO2 98 %. Body mass index is 39.87 kg/m.  General: Cooperative, alert, well developed, in no acute distress. HEENT: Conjunctivae and lids unremarkable. Cardiovascular: Regular rhythm.  Lungs: Normal work of breathing. Neurologic: No focal deficits.   Lab Results  Component Value Date   CREATININE 0.73 08/28/2021   BUN 13 08/28/2021   NA 140 08/28/2021   K 4.5 08/28/2021   CL 102 08/28/2021   CO2 25 08/28/2021   Lab Results  Component Value Date   ALT 9 08/28/2021   AST 11 08/28/2021   ALKPHOS 57 08/28/2021   BILITOT 0.4 08/28/2021   Lab Results  Component Value Date   HGBA1C 5.2 08/28/2021   HGBA1C 5.3 05/03/2021   HGBA1C 5.3 12/29/2020   HGBA1C 5.3 03/31/2020   HGBA1C 5.5 12/15/2019   Lab Results  Component Value Date   INSULIN 10.7 08/28/2021   INSULIN 17.5 05/03/2021   INSULIN 10.1 12/29/2020   INSULIN 8.9 06/09/2020   INSULIN 12.4 03/31/2020   Lab Results  Component Value Date   TSH 0.083 (L) 03/01/2022   Lab Results   Component Value Date   CHOL 207 (H) 03/01/2022   HDL 40 03/01/2022   LDLCALC 138 (H) 03/01/2022   TRIG 160 (H) 03/01/2022   CHOLHDL 5.2 (H) 03/01/2022   Lab Results  Component Value Date   VD25OH 45.2 05/02/2022   VD25OH 34.6 11/21/2021   VD25OH 34.9 08/28/2021   Lab Results  Component Value Date   WBC 10.3 03/01/2022   HGB 13.0 03/01/2022   HCT 37.7 03/01/2022   MCV 88 03/01/2022   PLT 382 03/01/2022   Lab Results  Component Value Date   IRON 64 08/13/2019   TIBC 333 08/13/2019   FERRITIN 30 08/13/2019   Attestation Statements:   Reviewed by clinician on day of visit: allergies, medications, problem list, medical history, surgical history, family history, social history, and previous encounter notes.  I have reviewed the above documentation for accuracy and completeness, and I agree with the above. -  Athenia Rys d. Demere Dotzler, NP-C

## 2022-07-26 ENCOUNTER — Ambulatory Visit (INDEPENDENT_AMBULATORY_CARE_PROVIDER_SITE_OTHER): Payer: Commercial Managed Care - PPO | Admitting: Clinical

## 2022-07-26 DIAGNOSIS — F331 Major depressive disorder, recurrent, moderate: Secondary | ICD-10-CM | POA: Diagnosis not present
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
## 2022-08-01 NOTE — Patient Instructions (Signed)
Preventive Care 40-50 Years Old, Female Preventive care refers to lifestyle choices and visits with your health care provider that can promote health and wellness. Preventive care visits are also called wellness exams. What can I expect for my preventive care visit? Counseling Your health care provider may ask you questions about your: Medical history, including: Past medical problems. Family medical history. Pregnancy history. Current health, including: Menstrual cycle. Method of birth control. Emotional well-being. Home life and relationship well-being. Sexual activity and sexual health. Lifestyle, including: Alcohol, nicotine or tobacco, and drug use. Access to firearms. Diet, exercise, and sleep habits. Work and work environment. Sunscreen use. Safety issues such as seatbelt and bike helmet use. Physical exam Your health care provider will check your: Height and weight. These may be used to calculate your BMI (body mass index). BMI is a measurement that tells if you are at a healthy weight. Waist circumference. This measures the distance around your waistline. This measurement also tells if you are at a healthy weight and may help predict your risk of certain diseases, such as type 2 diabetes and high blood pressure. Heart rate and blood pressure. Body temperature. Skin for abnormal spots. What immunizations do I need?  Vaccines are usually given at various ages, according to a schedule. Your health care provider will recommend vaccines for you based on your age, medical history, and lifestyle or other factors, such as travel or where you work. What tests do I need? Screening Your health care provider may recommend screening tests for certain conditions. This may include: Lipid and cholesterol levels. Diabetes screening. This is done by checking your blood sugar (glucose) after you have not eaten for a while (fasting). Pelvic exam and Pap test. Hepatitis B test. Hepatitis C  test. HIV (human immunodeficiency virus) test. STI (sexually transmitted infection) testing, if you are at risk. Lung cancer screening. Colorectal cancer screening. Mammogram. Talk with your health care provider about when you should start having regular mammograms. This may depend on whether you have a family history of breast cancer. BRCA-related cancer screening. This may be done if you have a family history of breast, ovarian, tubal, or peritoneal cancers. Bone density scan. This is done to screen for osteoporosis. Talk with your health care provider about your test results, treatment options, and if necessary, the need for more tests. Follow these instructions at home: Eating and drinking  Eat a diet that includes fresh fruits and vegetables, whole grains, lean protein, and low-fat dairy products. Take vitamin and mineral supplements as recommended by your health care provider. Do not drink alcohol if: Your health care provider tells you not to drink. You are pregnant, may be pregnant, or are planning to become pregnant. If you drink alcohol: Limit how much you have to 0-1 drink a day. Know how much alcohol is in your drink. In the [place] one drink equals one 12 oz bottle of beer (355 mL), one 5 oz glass of wine (148 mL), or one 1 oz glass of hard liquor (44 mL). Lifestyle Brush your teeth every morning and night with fluoride toothpaste. Floss one time each day. Exercise for at least 30 minutes 5 or more days each week. Do not use any products that contain nicotine or tobacco. These products include cigarettes, chewing tobacco, and vaping devices, such as e-cigarettes. If you need help quitting, ask your health care provider. Do not use drugs. If you are sexually active, practice safe sex. Use a condom or other form of protection to   prevent STIs. If you do not wish to become pregnant, use a form of birth control. If you plan to become pregnant, see your health care provider for a  prepregnancy visit. Take aspirin only as told by your health care provider. Make sure that you understand how much to take and what form to take. Work with your health care provider to find out whether it is safe and beneficial for you to take aspirin daily. Find healthy ways to manage stress, such as: Meditation, yoga, or listening to music. Journaling. Talking to a trusted person. Spending time with friends and family. Minimize exposure to UV radiation to reduce your risk of skin cancer. Safety Always wear your seat belt while driving or riding in a vehicle. Do not drive: If you have been drinking alcohol. Do not ride with someone who has been drinking. When you are tired or distracted. While texting. If you have been using any mind-altering substances or drugs. Wear a helmet and other protective equipment during sports activities. If you have firearms in your house, make sure you follow all gun safety procedures. Seek help if you have been physically or sexually abused. What's next? Visit your health care provider once a year for an annual wellness visit. Ask your health care provider how often you should have your eyes and teeth checked. Stay up to date on all vaccines. This information is not intended to replace advice given to you by your health care provider. Make sure you discuss any questions you have with your health care provider. Document Revised: 09/07/2020 Document Reviewed: 09/07/2020 Elsevier Patient Education  2023 Elsevier Inc. Breast Self-Awareness Breast self-awareness is knowing how your breasts look and feel. You need to: Check your breasts on a regular basis. Tell your doctor about any changes. Become familiar with the look and feel of your breasts. This can help you catch a breast problem while it is still small and can be treated. You should do breast self-exams even if you have breast implants. What you need: A mirror. A well-lit room. A pillow or other  soft object. How to do a breast self-exam Follow these steps to do a breast self-exam: Look for changes  Take off all the clothes above your waist. Stand in front of a mirror in a room with good lighting. Put your hands down at your sides. Compare your breasts in the mirror. Look for any difference between them, such as: A difference in shape. A difference in size. Wrinkles, dips, and bumps in one breast and not the other. Look at each breast for changes in the skin, such as: Redness. Scaly areas. Skin that has gotten thicker. Dimpling. Open sores (ulcers). Look for changes in your nipples, such as: Fluid coming out of a nipple. Fluid around a nipple. Bleeding. Dimpling. Redness. A nipple that looks pushed in (retracted), or that has changed position. Feel for changes Lie on your back. Feel each breast. To do this: Pick a breast to feel. Place a pillow under the shoulder closest to that breast. Put the arm closest to that breast behind your head. Feel the nipple area of that breast using the hand of your other arm. Feel the area with the pads of your three middle fingers by making small circles with your fingers. Use light, medium, and firm pressure. Continue the overlapping circles, moving downward over the breast. Keep making circles with your fingers. Stop when you feel your ribs. Start making circles with your fingers again, this time going   upward until you reach your collarbone. Then, make circles outward across your breast and into your armpit area. Squeeze your nipple. Check for discharge and lumps. Repeat these steps to check your other breast. Sit or stand in the tub or shower. With soapy water on your skin, feel each breast the same way you did when you were lying down. Write down what you find Writing down what you find can help you remember what to tell your doctor. Write down: What is normal for each breast. Any changes you find in each breast. These  include: The kind of changes you find. A tender or painful breast. Any lump you find. Write down its size and where it is. When you last had your monthly period (menstrual cycle). General tips If you are breastfeeding, the best time to check your breasts is after you feed your baby or after you use a breast pump. If you get monthly bleeding, the best time to check your breasts is 5-7 days after your monthly cycle ends. With time, you will become comfortable with the self-exam. You will also start to know if there are changes in your breasts. Contact a doctor if: You see a change in the shape or size of your breasts or nipples. You see a change in the skin of your breast or nipples, such as red or scaly skin. You have fluid coming from your nipples that is not normal. You find a new lump or thick area. You have breast pain. You have any concerns about your breast health. Summary Breast self-awareness includes looking for changes in your breasts and feeling for changes within your breasts. You should do breast self-awareness in front of a mirror in a well-lit room. If you get monthly periods (menstrual cycles), the best time to check your breasts is 5-7 days after your period ends. Tell your doctor about any changes you see in your breasts. Changes include changes in size, changes on the skin, painful or tender breasts, or fluid from your nipples that is not normal. This information is not intended to replace advice given to you by your health care provider. Make sure you discuss any questions you have with your health care provider. Document Revised: 08/17/2021 Document Reviewed: 01/12/2021 Elsevier Patient Education  2023 Elsevier Inc.  

## 2022-08-03 NOTE — Progress Notes (Unsigned)
ANNUAL PREVENTATIVE CARE GYNECOLOGY  ENCOUNTER NOTE  Subjective:       Caitlin Barrett is a 52 y.o. G0P0000 female here for a routine annual gynecologic exam. The patient {is/is not/has never been:13135} sexually active. The patient {is/is not:13135} taking hormone replacement therapy. {post-men bleed:13152::"Patient denies post-menopausal vaginal bleeding."} The patient wears seatbelts: {yes/no:311178}. The patient participates in regular exercise: {yes/no/not asked:9010}. Has the patient ever been transfused or tattooed?: {yes/no/not asked:9010}. The patient reports that there {is/is not:9024} domestic violence in her life.  Current complaints: 1.  ***    Gynecologic History Patient's last menstrual period was 05/26/2016 (exact date). Contraception: {method:5051} Last Pap: ***. Results were: {norm/abn:16337} Last mammogram: ***. Results were: {norm/abn:16337} Last Colonoscopy:  Last Dexa Scan:    Obstetric History OB History  Gravida Para Term Preterm AB Living  0 0 0 0 0 0  SAB IAB Ectopic Multiple Live Births  0 0 0 0 0    Past Medical History:  Diagnosis Date   Allergy    seasonal.  Takes allergy shots at [facility]   Anemia    Anxiety    Arthritis    Asthma    exercise induced--no inhalers   Back pain    Cancer (HCC) 2010   Basal cell skin cancer Lt eye, skin graft above Lt eye   Constipation    Costochondritis    Depression    Dysmenorrhea    Eczema    Edema, lower extremity    GERD (gastroesophageal reflux disease)    Headache    Hypothyroid    Nodules and Fine needle aspiration   IBS (irritable bowel syndrome)    Lab test positive for detection of COVID-19 virus    November 2020   Myalgia    Osteoarthritis    Painful menstrual periods    Palpitations    Plantar fasciitis    Shortness of breath    Sprain of carpal (joint) of wrist    Vitamin B 12 deficiency    Vitamin D deficiency     Family History  Problem Relation Age of Onset    Diabetes Mother    Hyperlipidemia Mother    Hypertension Mother    Sleep apnea Mother    Obesity Mother    Cancer Father        lung CA   Heart disease Maternal Grandmother    Stroke Maternal Grandmother    Hypertension Maternal Grandmother    Diabetes Maternal Grandmother    Heart disease Maternal Grandfather    Diabetes Maternal Grandfather    Heart attack Maternal Grandfather     Past Surgical History:  Procedure Laterality Date   ABDOMINAL HYSTERECTOMY Bilateral 07/02/2016   Procedure: HYSTERECTOMY ABDOMINAL WITH BILATERAL SALPINGECTOMY-MIDLINE INCISION;  Surgeon: Herold Harms, MD;  Location: [facility];  Service: Gynecology;  Laterality: Bilateral;   CHOLECYSTECTOMY  2009   COLONOSCOPY WITH PROPOFOL N/A 01/26/2021   Procedure: COLONOSCOPY WITH PROPOFOL;  Surgeon: Toney Reil, MD;  Location: [facility];  Service: Gastroenterology;  Laterality: N/A;   fibromalga     HERNIA REPAIR  1974   umbilical   NASAL SEPTOPLASTY W/ TURBINOPLASTY Bilateral 03/03/2021   Procedure: NASAL SEPTOPLASTY WITH SUBMUCOSAL RESECTION OF TURBINATE;  Surgeon: Linus Salmons, MD;  Location: [facility];  Service: ENT;  Laterality: Bilateral;   SKIN GRAFT  2010   TONSILLECTOMY      Social History   Socioeconomic History   Marital status: Married    Spouse name: Not  on file   Number of children: Not on file   Years of education: Not on file   Highest education level: Not on file  Occupational History   Occupation: pet sitter/dog walker  Tobacco Use   Smoking status: Never    Passive exposure: Never   Smokeless tobacco: Never  Vaping Use   Vaping Use: Never used  Substance and Sexual Activity   Alcohol use: Yes    Comment: rare   Drug use: No   Sexual activity: Yes    Birth control/protection: Surgical  Other Topics Concern   Not on file  Social History Narrative   Not on file   Social Determinants of Health   Financial Resource Strain: Not on file  Food  Insecurity: Not on file  Transportation Needs: Not on file  Physical Activity: Not on file  Stress: Not on file  Social Connections: Not on file  Intimate Partner Violence: Not on file    Current Outpatient Medications on File Prior to Visit  Medication Sig Dispense Refill   albuterol (VENTOLIN HFA) 108 (90 Base) MCG/ACT inhaler TAKE 2 PUFFS BY MOUTH EVERY 6 HOURS AS NEEDED FOR WHEEZE OR SHORTNESS OF BREATH 8.5 each 0   clobetasol cream (TEMOVATE) 0.05 % Apply 1 application topically 2 (two) times daily. For up to 1-2 weeks then as needed. Do not use on sensitive skin. 30 g 1   COLLAGEN-BORON-HYALURONIC ACID PO Take by mouth.     cyclobenzaprine (FLEXERIL) 10 MG tablet Take 1 tablet (10 mg total) by mouth at bedtime as needed for muscle spasms. 30 tablet 5   DULoxetine HCl 40 MG CPEP Take 1 capsule (40 mg total) by mouth daily. 90 capsule 0   EPINEPHrine 0.3 mg/0.3 mL IJ SOAJ injection 0.3 mg as needed.     estradiol (ESTRACE) 1 MG tablet TAKE 1 TABLET BY MOUTH EVERY DAY 90 tablet 4   fluticasone (FLONASE) 50 MCG/ACT nasal spray Place into both nostrils as needed.     loratadine (CLARITIN) 10 MG tablet Take 10 mg by mouth daily as needed.     Misc Natural Products (OSTEO BI-FLEX ADV TRIPLE ST PO) Take by mouth.     Multiple Vitamins-Minerals (MULTIVITAMIN WOMEN PO) Take by mouth.     omeprazole (PRILOSEC) 40 MG capsule Take 40 mg by mouth as needed.     oxymetazoline (AFRIN) 0.05 % nasal spray Place 1 spray into both nostrils as needed.      Probiotic Product (TRUBIOTICS) CAPS Take 1 capsule by mouth daily.     progesterone (PROMETRIUM) 200 MG capsule TAKE 1 CAPSULE BY MOUTH EVERY DAY 90 capsule 3   triamcinolone cream (KENALOG) 0.1 % APPLY THIN FILM TWICE DAILY TO ECZEMA UNTIL CLEAR     TURMERIC PO Take by mouth daily.     VITAMIN D PO Take 1,000 Units by mouth daily.     Vitamin D, Ergocalciferol, (DRISDOL) 1.25 MG (50000 UNIT) CAPS capsule TAKE ONE CAPSULE BY MOUTH TWICE PER WEEK 16  capsule 0   No current facility-administered medications on file prior to visit.    Allergies  Allergen Reactions   Iodinated Contrast Media Hives    Other reaction(s): Flushing   Codeine Nausea Only    Other reaction(s): Dizziness, Vomiting      Review of Systems ROS Review of Systems - General ROS: negative for - chills, fatigue, fever, hot flashes, night sweats, weight gain or weight loss Psychological ROS: negative for - anxiety, decreased libido, depression, mood  swings, physical abuse or sexual abuse Ophthalmic ROS: negative for - blurry vision, eye pain or loss of vision ENT ROS: negative for - headaches, hearing change, visual changes or vocal changes Allergy and Immunology ROS: negative for - hives, itchy/watery eyes or seasonal allergies Hematological and Lymphatic ROS: negative for - bleeding problems, bruising, swollen lymph nodes or weight loss Endocrine ROS: negative for - galactorrhea, hair pattern changes, hot flashes, malaise/lethargy, mood swings, palpitations, polydipsia/polyuria, skin changes, temperature intolerance or unexpected weight changes Breast ROS: negative for - new or changing breast lumps or nipple discharge Respiratory ROS: negative for - cough or shortness of breath Cardiovascular ROS: negative for - chest pain, irregular heartbeat, palpitations or shortness of breath Gastrointestinal ROS: no abdominal pain, change in bowel habits, or black or bloody stools Genito-Urinary ROS: no dysuria, trouble voiding, or hematuria Musculoskeletal ROS: negative for - joint pain or joint stiffness Neurological ROS: negative for - bowel and bladder control changes Dermatological ROS: negative for rash and skin lesion changes   Objective:   LMP 05/26/2016 (Exact Date)  CONSTITUTIONAL: Well-developed, well-nourished female in no acute distress.  PSYCHIATRIC: Normal mood and affect. Normal behavior. Normal judgment and thought content. NEUROLGIC: Alert and  oriented to person, place, and time. Normal muscle tone coordination. No cranial nerve deficit noted. HENT:  Normocephalic, atraumatic, External right and left ear normal. Oropharynx is clear and moist EYES: Conjunctivae and EOM are normal. Pupils are equal, round, and reactive to light. No scleral icterus.  NECK: Normal range of motion, supple, no masses.  Normal thyroid.  SKIN: Skin is warm and dry. No rash noted. Not diaphoretic. No erythema. No pallor. CARDIOVASCULAR: Normal heart rate noted, regular rhythm, no murmur. RESPIRATORY: Clear to auscultation bilaterally. Effort and breath sounds normal, no problems with respiration noted. BREASTS: Symmetric in size. No masses, skin changes, nipple drainage, or lymphadenopathy. ABDOMEN: Soft, normal bowel sounds, no distention noted.  No tenderness, rebound or guarding.  BLADDER: Normal PELVIC:  Bladder {:311640}  Urethra: {:311719}  Vulva: {:311722}  Vagina: {:311643}  Cervix: {:311644}  Uterus: {:311718}  Adnexa: {:311645}  RV: {Blank multiple:19196::"External Exam NormaI","No Rectal Masses","Normal Sphincter tone"}  MUSCULOSKELETAL: Normal range of motion. No tenderness.  No cyanosis, clubbing, or edema.  2+ distal pulses. LYMPHATIC: No Axillary, Supraclavicular, or Inguinal Adenopathy.   Labs: Lab Results  Component Value Date   WBC 10.3 03/01/2022   HGB 13.0 03/01/2022   HCT 37.7 03/01/2022   MCV 88 03/01/2022   PLT 382 03/01/2022    Lab Results  Component Value Date   CREATININE 0.73 08/28/2021   BUN 13 08/28/2021   NA 140 08/28/2021   K 4.5 08/28/2021   CL 102 08/28/2021   CO2 25 08/28/2021    Lab Results  Component Value Date   ALT 9 08/28/2021   AST 11 08/28/2021   ALKPHOS 57 08/28/2021   BILITOT 0.4 08/28/2021    Lab Results  Component Value Date   CHOL 207 (H) 03/01/2022   HDL 40 03/01/2022   LDLCALC 138 (H) 03/01/2022   TRIG 160 (H) 03/01/2022   CHOLHDL 5.2 (H) 03/01/2022    Lab Results   Component Value Date   TSH 0.083 (L) 03/01/2022    Lab Results  Component Value Date   HGBA1C 5.2 08/28/2021     Assessment:   No diagnosis found.   Plan:  Pap: {Blank multiple:19196::"Pap, Reflex if ASCUS","Pap Co Test","GC/CT NAAT","Not needed","Not done"} Mammogram: {Blank multiple:19196::"***","Ordered","Not Ordered","Not Indicated"} Colon Screening:  {Blank multiple:19196::"***","Ordered","Not Ordered","Not Indicated"}  Labs: {Blank multiple:19196::"Lipid 1","FBS","TSH","Hemoglobin A1C","Vit D Level""***"} Routine preventative health maintenance measures emphasized: {Blank multiple:19196::"Exercise/Diet/Weight control","Tobacco Warnings","Alcohol/Substance use risks","Stress Management","Peer Pressure Issues","Safe Sex"} COVID Vaccination status: Return to Clinic - 1 Year   Loney Laurence, [facility]

## 2022-08-07 ENCOUNTER — Ambulatory Visit (INDEPENDENT_AMBULATORY_CARE_PROVIDER_SITE_OTHER): Payer: Commercial Managed Care - PPO | Admitting: Obstetrics and Gynecology

## 2022-08-07 ENCOUNTER — Other Ambulatory Visit (HOSPITAL_COMMUNITY)
Admission: RE | Admit: 2022-08-07 | Discharge: 2022-08-07 | Disposition: A | Discharge status: Home / Self Care | Payer: Commercial Managed Care - PPO | Source: Ambulatory Visit | Attending: Obstetrics and Gynecology | Admitting: Obstetrics and Gynecology

## 2022-08-07 ENCOUNTER — Encounter: Payer: Self-pay | Admitting: Obstetrics and Gynecology

## 2022-08-07 VITALS — BP 139/66 | HR 82 | Resp 16 | Ht 69.0 in | Wt 277.5 lb

## 2022-08-07 DIAGNOSIS — Z124 Encounter for screening for malignant neoplasm of cervix: Secondary | ICD-10-CM | POA: Diagnosis present

## 2022-08-07 DIAGNOSIS — R87622 Low grade squamous intraepithelial lesion on cytologic smear of vagina (LGSIL): Secondary | ICD-10-CM

## 2022-08-07 DIAGNOSIS — Z01419 Encounter for gynecological examination (general) (routine) without abnormal findings: Secondary | ICD-10-CM

## 2022-08-07 DIAGNOSIS — Z79890 Hormone replacement therapy (postmenopausal): Secondary | ICD-10-CM

## 2022-08-07 DIAGNOSIS — R1032 Left lower quadrant pain: Secondary | ICD-10-CM

## 2022-08-07 DIAGNOSIS — Z1231 Encounter for screening mammogram for malignant neoplasm of breast: Secondary | ICD-10-CM

## 2022-08-07 MED ORDER — ESTRADIOL 1 MG PO TABS: 1.0000 mg | ORAL_TABLET | Freq: Every day | ORAL | 3 refills | Status: DC

## 2022-08-07 MED ORDER — PROGESTERONE 200 MG PO CAPS: ORAL_CAPSULE | ORAL | 3 refills | Status: DC

## 2022-08-08 ENCOUNTER — Ambulatory Visit (INDEPENDENT_AMBULATORY_CARE_PROVIDER_SITE_OTHER): Payer: Commercial Managed Care - PPO | Admitting: Adult Health

## 2022-08-08 ENCOUNTER — Encounter (INDEPENDENT_AMBULATORY_CARE_PROVIDER_SITE_OTHER): Payer: Self-pay | Admitting: Adult Health

## 2022-08-08 VITALS — BP 130/72 | HR 82 | Temp 97.7°F | Ht 69.0 in | Wt 269.0 lb

## 2022-08-08 DIAGNOSIS — E559 Vitamin D deficiency, unspecified: Secondary | ICD-10-CM | POA: Diagnosis not present

## 2022-08-08 DIAGNOSIS — F329 Major depressive disorder, single episode, unspecified: Secondary | ICD-10-CM

## 2022-08-08 DIAGNOSIS — R5383 Other fatigue: Secondary | ICD-10-CM

## 2022-08-08 DIAGNOSIS — Z6839 Body mass index (BMI) 39.0-39.9, adult: Secondary | ICD-10-CM

## 2022-08-08 DIAGNOSIS — M791 Myalgia, unspecified site: Secondary | ICD-10-CM

## 2022-08-08 MED ORDER — DULOXETINE HCL 40 MG PO CPEP
40.0000 mg | ORAL_CAPSULE | Freq: Every day | ORAL | 0 refills | Status: DC
Start: 2022-08-08 — End: 2022-08-29

## 2022-08-08 MED ORDER — VITAMIN D (ERGOCALCIFEROL) 1.25 MG (50000 UNIT) PO CAPS: ORAL_CAPSULE | ORAL | 0 refills | Status: DC

## 2022-08-08 NOTE — Progress Notes (Signed)
WEIGHT SUMMARY AND BIOMETRICS  Vitals Temp: 97.7 F (36.5 C) BP: 130/72 Pulse Rate: 82 SpO2: 100 %   Anthropometric Measurements Height: 5\' 9"  (1.753 m) Weight: 269 lb (122 kg) BMI (Calculated): 39.71 Weight at Last Visit: 270lb Weight Lost Since Last Visit: 1lb Weight Gained Since Last Visit: 0 Starting Weight: 277lb Total Weight Loss (lbs): 9 lb (4.082 kg)   Body Composition  Body Fat %: 46.3 % Fat Mass (lbs): 125 lbs Muscle Mass (lbs): 137.6 lbs Total Body Water (lbs): 91 lbs Visceral Fat Rating : 13   Other Clinical Data Fasting: no Labs: no Today's Visit #: 42 Starting Date: 08/13/19    Chief Complaint:   OBESITY Caitlin Barrett is here to discuss her progress with her obesity treatment plan. She is on the the Category 2 Plan and states she is following her eating plan approximately 85 % of the time. She states she is exercising Walking 30 minutes 4 times per week.   Interim History:  Caitlin Barrett reports increase in fatigue and diffuse muscle pain the last several weeks.  Stress- She denies increase in stress/anxiety.  She endorses stable mood.  She is currently on Cymbalta 40mg  QD. Sleep- She goes to bed nightly 2300 and rises 0700.  She will wake to urinates 1-2 times.  She reports difficulty returning to sleep due to diffuse muscle stiffness/pain and racing thoughts. Exercise-Walking- dog walker and walking on her own. Hydration-She drinks 6 bottles of water/day- unsure of total oz of bottle that she uses.  Subjective:   1. Fatigue, unspecified type Caitlin Barrett reports increase in fatigue and diffuse muscle pain the last several weeks.  2. Muscle pain Caitlin Barrett reports increase in fatigue and diffuse muscle pain the last several weeks.  3. Vitamin D deficiency  Latest Reference Range & Units 12/29/20 09:02 05/03/21 08:03 08/28/21 10:07 11/21/21 08:06 05/02/22 08:03  Vitamin D, 25-Hydroxy 30.0 - 100.0 ng/mL 35.9 50.5 34.9 34.6 45.2  She has been on  twice weekly Ergocalciferol since on/about 09/21/2021  4. Major depressive disorder, remission status unspecified, unspecified whether recurrent She denies increase in stress/anxiety.  She endorses stable mood.  She is currently on Cymbalta 40mg  QD. She denies SI/HI She was started on SNRI therapy on/about 07/25/2021.   Assessment/Plan:   1. Fatigue, unspecified type Continue twice weekly Ergocalciferol. Start OTC Mg++ Malate  Consider referral for Sleep Study  2. Muscle pain Start OTC Mg++ Malate   3. Vitamin D deficiency Refill - Vitamin D, Ergocalciferol, (DRISDOL) 1.25 MG (50000 UNIT) CAPS capsule; TAKE ONE CAPSULE BY MOUTH TWICE PER WEEK  Dispense: 16 capsule; Refill: 0 Check Labs at next OV  4. Major depressive disorder, remission status unspecified, unspecified whether recurrent Refill - DULoxetine HCl 40 MG CPEP; Take 1 capsule (40 mg total) by mouth daily.  Dispense: 90 capsule; Refill: 0  5. BMI 39.0-39.9,adult-current BMI 39.71  Caitlin Barrett is currently in the action stage of change. As such, her goal is to continue with weight loss efforts. She has agreed to the Category 2 Plan.   Handouts:  Mg++ Malate Information  Exercise goals: For substantial health benefits, adults should do at least 150 minutes (2 hours and 30 minutes) a week of moderate-intensity, or 75 minutes (1 hour and 15 minutes) a week of vigorous-intensity aerobic physical activity, or an equivalent combination of moderate- and vigorous-intensity aerobic activity. Aerobic activity should be performed in episodes of at least 10 minutes, and preferably, it should be spread throughout the  week.  Behavioral modification strategies: increasing lean protein intake, decreasing simple carbohydrates, increasing vegetables, increasing water intake, decreasing eating out, no skipping meals, meal planning and cooking strategies, better snacking choices, and planning for success.  Caitlin Barrett has agreed to follow-up with our  clinic in 4 weeks. She was informed of the importance of frequent follow-up visits to maximize her success with intensive lifestyle modifications for her multiple health conditions.   Check Fasting Labs at next OV  Objective:   Blood pressure 130/72, pulse 82, temperature 97.7 F (36.5 C), height 5\' 9"  (1.753 m), weight 269 lb (122 kg), last menstrual period 05/26/2016, SpO2 100 %. Body mass index is 39.72 kg/m.  General: Cooperative, alert, well developed, in no acute distress. HEENT: Conjunctivae and lids unremarkable. Cardiovascular: Regular rhythm.  Lungs: Normal work of breathing. Neurologic: No focal deficits.   Lab Results  Component Value Date   CREATININE 0.73 08/28/2021   BUN 13 08/28/2021   NA 140 08/28/2021   K 4.5 08/28/2021   CL 102 08/28/2021   CO2 25 08/28/2021   Lab Results  Component Value Date   ALT 9 08/28/2021   AST 11 08/28/2021   ALKPHOS 57 08/28/2021   BILITOT 0.4 08/28/2021   Lab Results  Component Value Date   HGBA1C 5.2 08/28/2021   HGBA1C 5.3 05/03/2021   HGBA1C 5.3 12/29/2020   HGBA1C 5.3 03/31/2020   HGBA1C 5.5 12/15/2019   Lab Results  Component Value Date   INSULIN 10.7 08/28/2021   INSULIN 17.5 05/03/2021   INSULIN 10.1 12/29/2020   INSULIN 8.9 06/09/2020   INSULIN 12.4 03/31/2020   Lab Results  Component Value Date   TSH 0.083 (L) 03/01/2022   Lab Results  Component Value Date   CHOL 207 (H) 03/01/2022   HDL 40 03/01/2022   LDLCALC 138 (H) 03/01/2022   TRIG 160 (H) 03/01/2022   CHOLHDL 5.2 (H) 03/01/2022   Lab Results  Component Value Date   VD25OH 45.2 05/02/2022   VD25OH 34.6 11/21/2021   VD25OH 34.9 08/28/2021   Lab Results  Component Value Date   WBC 10.3 03/01/2022   HGB 13.0 03/01/2022   HCT 37.7 03/01/2022   MCV 88 03/01/2022   PLT 382 03/01/2022   Lab Results  Component Value Date   IRON 64 08/13/2019   TIBC 333 08/13/2019   FERRITIN 30 08/13/2019   Attestation Statements:   Reviewed by  clinician on day of visit: allergies, medications, problem list, medical history, surgical history, family history, social history, and previous encounter notes.  I have reviewed the above documentation for accuracy and completeness, and I agree with the above. -  Serjio Deupree d. Braedyn Riggle, NP-C

## 2022-08-11 ENCOUNTER — Other Ambulatory Visit (INDEPENDENT_AMBULATORY_CARE_PROVIDER_SITE_OTHER): Payer: Self-pay | Admitting: Adult Health

## 2022-08-11 ENCOUNTER — Other Ambulatory Visit (INDEPENDENT_AMBULATORY_CARE_PROVIDER_SITE_OTHER): Payer: Self-pay | Admitting: Physician Assistant

## 2022-08-11 DIAGNOSIS — E559 Vitamin D deficiency, unspecified: Secondary | ICD-10-CM

## 2022-08-14 LAB — CYTOLOGY - PAP
Comment: NEGATIVE
Diagnosis: NEGATIVE
High risk HPV: NEGATIVE

## 2022-08-24 ENCOUNTER — Ambulatory Visit (INDEPENDENT_AMBULATORY_CARE_PROVIDER_SITE_OTHER): Payer: Commercial Managed Care - PPO | Admitting: Clinical

## 2022-08-24 DIAGNOSIS — F331 Major depressive disorder, recurrent, moderate: Secondary | ICD-10-CM | POA: Diagnosis not present
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
## 2022-08-25 ENCOUNTER — Ambulatory Visit (INDEPENDENT_AMBULATORY_CARE_PROVIDER_SITE_OTHER): Payer: Commercial Managed Care - PPO

## 2022-08-25 ENCOUNTER — Ambulatory Visit
Admission: RE | Admit: 2022-08-25 | Discharge: 2022-08-25 | Disposition: A | Discharge status: Home / Self Care | Payer: Commercial Managed Care - PPO | Source: Ambulatory Visit | Attending: Family Medicine | Admitting: Family Medicine

## 2022-08-25 DIAGNOSIS — M549 Dorsalgia, unspecified: Secondary | ICD-10-CM

## 2022-08-25 DIAGNOSIS — M542 Cervicalgia: Secondary | ICD-10-CM

## 2022-08-25 DIAGNOSIS — S161XXA Strain of muscle, fascia and tendon at neck level, initial encounter: Secondary | ICD-10-CM

## 2022-08-25 DIAGNOSIS — M545 Low back pain, unspecified: Secondary | ICD-10-CM

## 2022-08-25 MED ORDER — PREDNISONE 10 MG (21) PO TBPK: ORAL_TABLET | Freq: Every day | ORAL | 0 refills | Status: DC

## 2022-08-25 MED ORDER — CYCLOBENZAPRINE HCL 10 MG PO TABS
10.0000 mg | ORAL_TABLET | Freq: Every evening | ORAL | 5 refills | Status: AC | PRN
Start: 2022-08-25 — End: ?
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
## 2022-08-27 ENCOUNTER — Encounter (INDEPENDENT_AMBULATORY_CARE_PROVIDER_SITE_OTHER): Payer: Self-pay | Admitting: Adult Health

## 2022-08-29 ENCOUNTER — Ambulatory Visit (INDEPENDENT_AMBULATORY_CARE_PROVIDER_SITE_OTHER): Payer: Commercial Managed Care - PPO | Admitting: Adult Health

## 2022-08-29 ENCOUNTER — Encounter (INDEPENDENT_AMBULATORY_CARE_PROVIDER_SITE_OTHER): Payer: Self-pay | Admitting: Adult Health

## 2022-08-29 VITALS — BP 145/82 | HR 79 | Temp 97.7°F | Ht 69.0 in | Wt 270.0 lb

## 2022-08-29 DIAGNOSIS — E559 Vitamin D deficiency, unspecified: Secondary | ICD-10-CM

## 2022-08-29 DIAGNOSIS — E669 Obesity, unspecified: Secondary | ICD-10-CM | POA: Diagnosis not present

## 2022-08-29 DIAGNOSIS — F329 Major depressive disorder, single episode, unspecified: Secondary | ICD-10-CM

## 2022-08-29 DIAGNOSIS — Z6839 Body mass index (BMI) 39.0-39.9, adult: Secondary | ICD-10-CM

## 2022-08-29 MED ORDER — VITAMIN D (ERGOCALCIFEROL) 1.25 MG (50000 UNIT) PO CAPS
ORAL_CAPSULE | ORAL | 0 refills | Status: DC
Start: 2022-08-29 — End: 2022-10-02

## 2022-08-29 MED ORDER — DULOXETINE HCL 40 MG PO CPEP
40.0000 mg | ORAL_CAPSULE | Freq: Every day | ORAL | 0 refills | Status: DC
Start: 2022-08-29 — End: 2023-01-17
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
## 2022-09-03 ENCOUNTER — Ambulatory Visit: Payer: Self-pay

## 2022-09-03 NOTE — Telephone Encounter (Signed)
Summary: Dizziness   Pt feels lightheaded and dizzy     Called pt - Left message on machine to return call.

## 2022-09-03 NOTE — Telephone Encounter (Signed)
Summary: Dizziness   Pt feels lightheaded and dizzy          Called pt - left message on machine to return our call.

## 2022-09-03 NOTE — Telephone Encounter (Signed)
Message from Randol Kern sent at 09/03/2022  3:06 PM EDT  Summary: Dizziness   Pt feels lightheaded and dizzy        Attempted to contact pt but unable to LM- VM full.

## 2022-09-10 ENCOUNTER — Encounter: Payer: Self-pay | Admitting: Family Medicine

## 2022-09-10 ENCOUNTER — Ambulatory Visit (INDEPENDENT_AMBULATORY_CARE_PROVIDER_SITE_OTHER): Payer: Commercial Managed Care - PPO | Admitting: Family Medicine

## 2022-09-10 VITALS — BP 144/88 | HR 86 | Ht 69.0 in | Wt 283.0 lb

## 2022-09-10 DIAGNOSIS — R55 Syncope and collapse: Secondary | ICD-10-CM

## 2022-09-10 DIAGNOSIS — G43909 Migraine, unspecified, not intractable, without status migrainosus: Secondary | ICD-10-CM

## 2022-09-10 NOTE — Patient Instructions (Addendum)
Thank you for coming to the office today.  1. As discussed, I do not know the exact cause of your syncopal episodes (passing out), usually we divide this into either concerning or less concerning syncopal episodes. The most common type is Vasovagal Syncope, often described as you did with feeling flushed or sweating, lightheaded or dizzy, usually these are random episodes, triggered by a variety of causes (can be stress, emotional, physical, straining with exercise or bowel movement even, dehydration poor intake). Still a medical concern, but usually more of a benign problem, there is limited treatment or testing to be done for this type of syncope.  The concerning syncope is either caused by Cardiac (Heart) or Neurogenic (Brain), usually provoked by exertional activity, associated with high risk symptoms chest pain, tightness or pressure, shortness of breath, headache, or stroke like symptoms significant facial or arm/leg weakness, numbness, or related to seizure activity - if you develop any of these symptoms seek help immediately at hospital ED.    From now on, be mindful of possible syncopal episodes,   I would encourage increase water hydration, try using a bottle or way to measure water intake, goal for at least 12-16 oz container about 2-3 times a day, can reduce tea intake, as this tends to cause you to be more dehydrated.   If syncopal episode occurs again without any of the above significant red flag symptoms, and it resolves on it's own and you don't feel persistently sick, then you may notify our office, follow-up in our office, or seek treatment at Urgent Care, we can discuss future referrals such as Cardiology and other testing.   We can refer to Neurology if / when ready based on your symptoms and headaches.  Nurtec ODT 75mg  dissolving tab for migraine take if need, can repeat 48 hours. If need again. Let me know   Please schedule a Follow-up Appointment to: Return if symptoms worsen  or fail to improve.  If you have any other questions or concerns, please feel free to call the office or send a message through MyChart. You may also schedule an earlier appointment if necessary.  Additionally, you may be receiving a survey about your experience at our office within a few days to 1 week by e-mail or mail. We value your feedback.  Saralyn Pilar, DO [facility], [place]
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
## 2022-09-11 ENCOUNTER — Encounter: Payer: Self-pay | Admitting: Family Medicine

## 2022-09-12 LAB — CBC WITH DIFFERENTIAL/PLATELET
Absolute Monocytes: 632 cells/uL (ref 200–950)
Basophils Absolute: 32 cells/uL (ref 0–200)
Basophils Relative: 0.4 %
Eosinophils Absolute: 712 cells/uL — ABNORMAL HIGH (ref 15–500)
Eosinophils Relative: 8.9 %
HCT: 34.8 % — ABNORMAL LOW (ref 35.0–45.0)
Hemoglobin: 11.6 g/dL — ABNORMAL LOW (ref 11.7–15.5)
Lymphs Abs: 1360 cells/uL (ref 850–3900)
MCH: 30 pg (ref 27.0–33.0)
MCHC: 33.3 g/dL (ref 32.0–36.0)
MCV: 89.9 fL (ref 80.0–100.0)
MPV: 9.1 fL (ref 7.5–12.5)
Monocytes Relative: 7.9 %
Neutro Abs: 5264 cells/uL (ref 1500–7800)
Neutrophils Relative %: 65.8 %
Platelets: 332 10*3/uL (ref 140–400)
RBC: 3.87 10*6/uL (ref 3.80–5.10)
RDW: 12 % (ref 11.0–15.0)
Total Lymphocyte: 17 %
WBC: 8 10*3/uL (ref 3.8–10.8)

## 2022-09-12 LAB — COMPLETE METABOLIC PANEL WITH GFR
AG Ratio: 1.7 (calc) (ref 1.0–2.5)
ALT: 12 U/L (ref 6–29)
AST: 11 U/L (ref 10–35)
Albumin: 3.8 g/dL (ref 3.6–5.1)
Alkaline phosphatase (APISO): 46 U/L (ref 31–125)
BUN: 13 mg/dL (ref 7–25)
CO2: 25 mmol/L (ref 20–32)
Calcium: 9 mg/dL (ref 8.6–10.2)
Chloride: 104 mmol/L (ref 98–110)
Creat: 0.75 mg/dL (ref 0.50–0.99)
Globulin: 2.3 g/dL (calc) (ref 1.9–3.7)
Glucose, Bld: 95 mg/dL (ref 65–99)
Potassium: 4.4 mmol/L (ref 3.5–5.3)
Sodium: 138 mmol/L (ref 135–146)
Total Bilirubin: 0.5 mg/dL (ref 0.2–1.2)
Total Protein: 6.1 g/dL (ref 6.1–8.1)
eGFR: 98 mL/min/{1.73_m2} (ref >=60)

## 2022-09-18 ENCOUNTER — Ambulatory Visit
Admission: RE | Admit: 2022-09-18 | Discharge: 2022-09-18 | Disposition: A | Discharge status: Home / Self Care | Payer: Commercial Managed Care - PPO | Source: Ambulatory Visit | Attending: Obstetrics and Gynecology | Admitting: Obstetrics and Gynecology

## 2022-09-18 DIAGNOSIS — Z1231 Encounter for screening mammogram for malignant neoplasm of breast: Secondary | ICD-10-CM | POA: Diagnosis present

## 2022-09-18 DIAGNOSIS — Z01419 Encounter for gynecological examination (general) (routine) without abnormal findings: Secondary | ICD-10-CM | POA: Insufficient documentation

## 2022-09-19 ENCOUNTER — Ambulatory Visit
Admission: RE | Admit: 2022-09-19 | Discharge: 2022-09-19 | Disposition: A | Discharge status: Home / Self Care | Payer: Commercial Managed Care - PPO | Source: Ambulatory Visit | Attending: Physician Assistant | Admitting: Physician Assistant

## 2022-09-19 VITALS — BP 170/88 | HR 98 | Temp 99.9°F | Resp 16

## 2022-09-19 DIAGNOSIS — R051 Acute cough: Secondary | ICD-10-CM

## 2022-09-19 DIAGNOSIS — R509 Fever, unspecified: Secondary | ICD-10-CM | POA: Diagnosis not present

## 2022-09-19 DIAGNOSIS — U071 COVID-19: Secondary | ICD-10-CM

## 2022-09-19 LAB — GROUP A STREP BY PCR: Group A Strep by PCR: NOT DETECTED

## 2022-09-19 LAB — SARS CORONAVIRUS 2 BY RT PCR: SARS Coronavirus 2 by RT PCR: POSITIVE — AB

## 2022-09-19 MED ORDER — NIRMATRELVIR/RITONAVIR (PAXLOVID)TABLET: 3.0000 | ORAL_TABLET | Freq: Two times a day (BID) | ORAL | 0 refills | Status: AC

## 2022-09-19 NOTE — ED Triage Notes (Signed)
Pt presents with a non-productive cough, headache, fever and nasal congestion since yesterday.

## 2022-09-19 NOTE — ED Provider Notes (Signed)
[facility]    CSN: [id number] Arrival date & time: 09/19/22  1343      History   Chief Complaint Chief Complaint  Patient presents with   Sore Throat    I have a sore throat, headache, congestion, fatigue, and fever (100.3). - Entered by patient   Fever   Headache   Cough    HPI Caitlin Barrett is a 52 y.o. female presenting for onset of fever up to 101.3 degrees, fatigue, chills, headache, sore throat, cough, congestion and runny nose yesterday.  Patient denies any sick contacts or recent travel.  She has taken over-the-counter ibuprofen, Tylenol and cough medication.  Current temp is 99.9 degrees.  She is denying any chest pain or shortness of breath, vomiting or diarrhea.  No other complaints.  HPI  Past Medical History:  Diagnosis Date   Allergy    seasonal.  Takes allergy shots at [facility]   Anemia    Anxiety    Arthritis    Asthma    exercise induced--no inhalers   Back pain    Cancer (HCC) 2010   Basal cell skin cancer Lt eye, skin graft above Lt eye   Constipation    Costochondritis    Depression    Dysmenorrhea    Eczema    Edema, lower extremity    GERD (gastroesophageal reflux disease)    Headache    Hypothyroid    Nodules and Fine needle aspiration   IBS (irritable bowel syndrome)    Lab test positive for detection of COVID-19 virus    November 2020   Myalgia    Osteoarthritis    Painful menstrual periods    Palpitations    Plantar fasciitis    Shortness of breath    Sprain of carpal (joint) of wrist    Vitamin B 12 deficiency    Vitamin D deficiency     Patient Active Problem List   Diagnosis Date Noted   BMI 39.0-39.9,adult-current bmi 39.4 06/20/2022   Morbid obesity (HCC)-start bmi 40.91 06/20/2022   Depression 08/11/2021   Bilateral lower extremity edema 08/06/2021   Status post nasal septoplasty 03/30/2021   Colon cancer screening    Polyp of colon    Depressed mood 12/01/2020   Nausea without vomiting  11/07/2020   Upper respiratory tract infection 11/07/2020   Thyroid disease 06/09/2020   Cervical strain 04/25/2020   Fatigue 04/04/2020   GAD (generalized anxiety disorder) 02/08/2020   Pericardial pain 01/28/2020   Muscle soreness 01/28/2020   Mixed hyperlipidemia 11/25/2019   Elevated BP without diagnosis of hypertension 11/25/2019   Polyphagia 10/08/2019   Vitamin D deficiency 09/17/2019   Insulin resistance 09/17/2019   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   Class 3 severe obesity with serious comorbidity and body mass index (BMI) of 40.0 to 44.9 in adult [facility]) 09/17/2019   Exercise-induced asthma 04/22/2018   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   Monilial vaginitis 02/26/2017   Decreased libido 02/26/2017   Major depressive disorder, recurrent, in full remission (HCC) 02/26/2017   Dyspareunia, female 02/26/2017   S/P TAH (total abdominal hysterectomy) 07/02/2016   Nevus of abdominal wall 06/11/2016   Morton's neuralgia 07/21/2015   Nocturia 07/21/2015   Stress incontinence 07/21/2015   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   Allergic rhinitis 06/21/2015   Obesity (BMI 30-39.9) 06/21/2015    Past Surgical History:  Procedure Laterality Date   ABDOMINAL HYSTERECTOMY Bilateral 07/02/2016   Procedure: HYSTERECTOMY ABDOMINAL WITH BILATERAL SALPINGECTOMY-MIDLINE INCISION;  Surgeon: Daphine Deutscher  A Defrancesco, MD;  Location: [facility] ORS;  Service: Gynecology;  Laterality: Bilateral;   CHOLECYSTECTOMY  2009   COLONOSCOPY WITH PROPOFOL N/A 01/26/2021   Procedure: COLONOSCOPY WITH PROPOFOL;  Surgeon: Toney Reil, MD;  Location: [facility] ENDOSCOPY;  Service: Gastroenterology;  Laterality: N/A;   fibromalga     HERNIA REPAIR  1974   umbilical   NASAL SEPTOPLASTY W/ TURBINOPLASTY Bilateral 03/03/2021   Procedure: NASAL SEPTOPLASTY WITH SUBMUCOSAL RESECTION OF TURBINATE;  Surgeon: Linus Salmons, MD;  Location: [facility];  Service: ENT;  Laterality: Bilateral;   SKIN GRAFT  2010    TONSILLECTOMY      OB History     Gravida  0   Para  0   Term  0   Preterm  0   AB  0   Living  0      SAB  0   IAB  0   Ectopic  0   Multiple  0   Live Births  0            Home Medications    Prior to Admission medications   Medication Sig Start Date End Date Taking? Authorizing Provider  nirmatrelvir/ritonavir (PAXLOVID) 20 x 150 MG & 10 x 100MG  TABS Take 3 tablets by mouth 2 (two) times daily for 5 days. Patient GFR is 98. Take nirmatrelvir (150 mg) two tablets twice daily for 5 days and ritonavir (100 mg) one tablet twice daily for 5 days. 09/19/22 09/24/22 Yes Eusebio Friendly B, PA-C  albuterol (VENTOLIN HFA) 108 (90 Base) MCG/ACT inhaler TAKE 2 PUFFS BY MOUTH EVERY 6 HOURS AS NEEDED FOR WHEEZE OR SHORTNESS OF BREATH 04/07/21   Rushie Chestnut, PA-C  clobetasol cream (TEMOVATE) 0.05 % Apply 1 application topically 2 (two) times daily. For up to 1-2 weeks then as needed. Do not use on sensitive skin. 04/24/19   Karamalegos, Netta Neat, DO  COLLAGEN-BORON-HYALURONIC ACID PO Take by mouth.    [provider]  cyclobenzaprine (FLEXERIL) 10 MG tablet Take 1 tablet (10 mg total) by mouth at bedtime as needed for muscle spasms. 08/25/22   Katha Cabal, DO  DULoxetine HCl 40 MG CPEP Take 1 capsule (40 mg total) by mouth daily. 08/29/22   Danford, Orpha Bur D, NP  EPINEPHrine 0.3 mg/0.3 mL IJ SOAJ injection 0.3 mg as needed. 09/25/08   [provider]  estradiol (ESTRACE) 1 MG tablet Take 1 tablet (1 mg total) by mouth daily. 08/07/22   Hildred Laser, MD  fluticasone (FLONASE) 50 MCG/ACT nasal spray Place into both nostrils as needed. 05/17/21   [provider]  loratadine (CLARITIN) 10 MG tablet Take 10 mg by mouth daily as needed. 10/13/20   [provider]  Misc Natural Products (OSTEO BI-FLEX ADV TRIPLE ST PO) Take by mouth.    [provider]  Multiple Vitamins-Minerals (MULTIVITAMIN WOMEN PO) Take by mouth.    [provider]  omeprazole (PRILOSEC) 40 MG capsule Take 40 mg by mouth as needed.    [provider]  Probiotic Product (TRUBIOTICS) CAPS Take 1 capsule by mouth daily.    [provider]  progesterone (PROMETRIUM) 200 MG capsule TAKE 1 CAPSULE BY MOUTH EVERY DAY 08/07/22   Hildred Laser, MD  triamcinolone cream (KENALOG) 0.1 % APPLY THIN FILM TWICE DAILY TO ECZEMA UNTIL CLEAR 12/23/18   [provider]  TURMERIC PO Take by mouth daily.    [provider]  VITAMIN D PO Take 1,000 Units by  mouth as directed. Twice a week    [provider]  Vitamin D, Ergocalciferol, (DRISDOL) 1.25 MG (50000 UNIT) CAPS capsule TAKE ONE CAPSULE BY MOUTH ONCE PER WEEK 08/29/22   Danford, Orpha Bur D, NP    Family History Family History  Problem Relation Age of Onset   Diabetes Mother    Hyperlipidemia Mother    Hypertension Mother    Sleep apnea Mother    Obesity Mother    Cancer Father        lung CA   Heart disease Maternal Grandmother    Stroke Maternal Grandmother    Hypertension Maternal Grandmother    Diabetes Maternal Grandmother    Heart disease Maternal Grandfather    Diabetes Maternal Grandfather    Heart attack Maternal Grandfather    Breast cancer Neg Hx     Social History Social History   Tobacco Use   Smoking status: Never    Passive exposure: Never   Smokeless tobacco: Never  Vaping Use   Vaping Use: Never used  Substance Use Topics   Alcohol use: Yes    Comment: rare   Drug use: No     Allergies   Iodinated contrast media and Codeine   Review of Systems Review of Systems  Constitutional:  Positive for chills, fatigue and fever. Negative for diaphoresis.  HENT:  Positive for congestion, rhinorrhea and sore throat. Negative for ear pain, sinus pressure and sinus pain.   Respiratory:  Positive for cough. Negative for shortness of breath and wheezing.   Cardiovascular:  Negative for chest pain.  Gastrointestinal:  Negative for abdominal pain,  nausea and vomiting.  Musculoskeletal:  Negative for arthralgias and myalgias.  Skin:  Negative for rash.  Neurological:  Positive for headaches. Negative for weakness.  Hematological:  Negative for adenopathy.     Physical Exam Triage Vital Signs ED Triage Vitals  Enc Vitals Group     BP      Pulse      Resp      Temp      Temp src      SpO2      Weight      Height      Head Circumference      Peak Flow      Pain Score      Pain Loc      Pain Edu?      Excl. in GC?    No data found.  Updated Vital Signs BP (!) 170/88 (BP Location: Left Wrist)   Pulse 98   Temp 99.9 F (37.7 C)   Resp 16   LMP 05/26/2016 (Exact Date)   SpO2 99%      Physical Exam Vitals and nursing note reviewed.  Constitutional:      General: She is not in acute distress.    Appearance: Normal appearance. She is ill-appearing. She is not toxic-appearing.  HENT:     Head: Normocephalic and atraumatic.     Nose: Congestion present.     Mouth/Throat:     Mouth: Mucous membranes are moist.     Pharynx: Oropharynx is clear. Posterior oropharyngeal erythema present.  Eyes:     General: No scleral icterus.       Right eye: No discharge.        Left eye: No discharge.     Conjunctiva/sclera: Conjunctivae normal.  Cardiovascular:     Rate and Rhythm: Normal rate and regular rhythm.     Heart sounds: Normal  heart sounds.  Pulmonary:     Effort: Pulmonary effort is normal. No respiratory distress.     Breath sounds: Normal breath sounds.  Musculoskeletal:     Cervical back: Neck supple.  Skin:    General: Skin is dry.  Neurological:     General: No focal deficit present.     Mental Status: She is alert. Mental status is at baseline.     Motor: No weakness.     Gait: Gait normal.  Psychiatric:        Mood and Affect: Mood normal.        Behavior: Behavior normal.        Thought Content: Thought content normal.      UC Treatments / Results  Labs (all labs ordered are listed, but  only abnormal results are displayed) Labs Reviewed  SARS CORONAVIRUS 2 BY RT PCR - Abnormal; Notable for the following components:      Result Value   SARS Coronavirus 2 by RT PCR POSITIVE (*)    All other components within normal limits  GROUP A STREP BY PCR    EKG   Radiology No results found.  Procedures Procedures (including critical care time)  Medications Ordered in UC Medications - No data to display  Initial Impression / Assessment and Plan / UC Course  I have reviewed the triage vital signs and the nursing notes.  Pertinent labs & imaging results that were available during my care of the patient were reviewed by me and considered in my medical decision making (see chart for details).   52 year old female presents for fever, fatigue, cough, congestion, sore throat and headaches beginning yesterday.  Blood pressure 170/88.  Temp 99.9 degrees.  O2 saturation is 99%.  Pulse is elevated at 98 bpm.  She is mildly ill-appearing but nontoxic.  On exam she has nasal congestion and erythema posterior pharynx.  Chest clear to auscultation heart regular rate and rhythm.  Strep and COVID testing obtained.  Strep negative.  COVID-positive.  Discussed results with patient.  She is interested in Paxlovid.  No contraindications.  She recently had CMP performed which shows GFR is 98.  Will prescribe standard dose of Paxlovid.  Reviewed current CDC guidelines, isolation protocol and ED precautions.  Also discussed continuing OTC meds for symptoms including ibuprofen, Tylenol, Mucinex, rest and fluids.  We did offer her a cough medication but she states she is fine taking OTC meds.  She reports she has had COVID twice already.  She is not complaining of any shortness of breath or breathing difficulty but does have a history of asthma.  Advised to use inhaler as needed but if she feels like her breathing is worsening she should return for reevaluation or go to the ER.  1 acute illness with  systemic symptoms.   Final Clinical Impressions(s) / UC Diagnoses   Final diagnoses:  COVID-19  Acute cough  Fever, unspecified     Discharge Instructions      -COVID-positive.  Isolate until you have been fever free for 24 hours and feeling better and then wear a mask as long as you are symptomatic.  Use inhaler at home as needed for shortness of breath but if you feel like that is not working, please return to go to ER.  URI/COLD SYMPTOMS: Your exam today is consistent with a viral illness. Antibiotics are not indicated at this time. Use medications as directed, including cough syrup, nasal saline, and decongestants. Your symptoms should improve over  the next few days and resolve within 7-10 days. Increase rest and fluids. F/u if symptoms worsen or predominate such as sore throat, ear pain, productive cough, shortness of breath, or if you develop high fevers or worsening fatigue over the next several days.       ED Prescriptions     Medication Sig Dispense Auth. Provider   nirmatrelvir/ritonavir (PAXLOVID) 20 x 150 MG & 10 x 100MG  TABS Take 3 tablets by mouth 2 (two) times daily for 5 days. Patient GFR is 98. Take nirmatrelvir (150 mg) two tablets twice daily for 5 days and ritonavir (100 mg) one tablet twice daily for 5 days. 30 tablet Gareth Morgan      PDMP not reviewed this encounter.   Shirlee Latch, PA-C 09/19/22 [id number]

## 2022-09-19 NOTE — Discharge Instructions (Signed)
-  COVID-positive.  Isolate until you have been fever free for 24 hours and feeling better and then wear a mask as long as you are symptomatic.  Use inhaler at home as needed for shortness of breath but if you feel like that is not working, please return to go to ER.  URI/COLD SYMPTOMS: Your exam today is consistent with a viral illness. Antibiotics are not indicated at this time. Use medications as directed, including cough syrup, nasal saline, and decongestants. Your symptoms should improve over the next few days and resolve within 7-10 days. Increase rest and fluids. F/u if symptoms worsen or predominate such as sore throat, ear pain, productive cough, shortness of breath, or if you develop high fevers or worsening fatigue over the next several days.

## 2022-09-30 ENCOUNTER — Encounter (INDEPENDENT_AMBULATORY_CARE_PROVIDER_SITE_OTHER): Payer: Self-pay | Admitting: Adult Health

## 2022-10-01 ENCOUNTER — Ambulatory Visit
Admission: RE | Admit: 2022-10-01 | Discharge: 2022-10-01 | Disposition: A | Discharge status: Home / Self Care | Payer: Commercial Managed Care - PPO | Source: Ambulatory Visit | Attending: Family Medicine | Admitting: Family Medicine

## 2022-10-01 ENCOUNTER — Ambulatory Visit: Payer: Commercial Managed Care - PPO

## 2022-10-01 VITALS — BP 135/80 | HR 101 | Temp 98.5°F | Resp 16 | Ht 69.0 in | Wt 283.0 lb

## 2022-10-01 DIAGNOSIS — R051 Acute cough: Secondary | ICD-10-CM

## 2022-10-01 DIAGNOSIS — R079 Chest pain, unspecified: Secondary | ICD-10-CM

## 2022-10-01 MED ORDER — AZITHROMYCIN 250 MG PO TABS: ORAL_TABLET | ORAL | 0 refills | Status: DC

## 2022-10-01 MED ORDER — ALBUTEROL SULFATE HFA 108 (90 BASE) MCG/ACT IN AERS
INHALATION_SPRAY | RESPIRATORY_TRACT | 0 refills | Status: DC
Start: 2022-10-01 — End: 2023-06-04

## 2022-10-01 MED ORDER — PREDNISONE 10 MG (21) PO TBPK: ORAL_TABLET | Freq: Every day | ORAL | 0 refills | Status: DC

## 2022-10-01 NOTE — ED Provider Notes (Signed)
[facility]    CSN: [id number] Arrival date & time: 10/01/22  1352      History   Chief Complaint Chief Complaint  Patient presents with   Cough    Appt   Congestion    HPI Caitlin Barrett is a 52 y.o. female.   HPI  History obtained from {source of history:310783}. Caitlin Barrett presents for cough, congestion, sore throat for the past 2 days. She was diagnosed with COVID on 09/19/22.  Cough gets worse when she lays down and has new pressure in ears.  Has fatigue. Saturaday she started getting fatigue and fever.  Tmax 99.4 F. She took Paxlovid.    Fever : no  Chills: no Sore throat: no   Cough: no Sputum: no Chest tightness: no Shortness of breath: no Wheezing: no  Nasal congestion : no  Rhinorrhea: no Myalgias: no Appetite: normal  Hydration: normal  Abdominal pain: no Nausea: no Vomiting: no Diarrhea: No Rash: No Sleep disturbance: no Headache: no      Past Medical History:  Diagnosis Date   Allergy    seasonal.  Takes allergy shots at [facility]   Anemia    Anxiety    Arthritis    Asthma    exercise induced--no inhalers   Back pain    Cancer (HCC) 2010   Basal cell skin cancer Lt eye, skin graft above Lt eye   Constipation    Costochondritis    Depression    Dysmenorrhea    Eczema    Edema, lower extremity    GERD (gastroesophageal reflux disease)    Headache    Hypothyroid    Nodules and Fine needle aspiration   IBS (irritable bowel syndrome)    Lab test positive for detection of COVID-19 virus    November 2020   Myalgia    Osteoarthritis    Painful menstrual periods    Palpitations    Plantar fasciitis    Shortness of breath    Sprain of carpal (joint) of wrist    Vitamin B 12 deficiency    Vitamin D deficiency     Patient Active Problem List   Diagnosis Date Noted   BMI 39.0-39.9,adult-current bmi 39.4 06/20/2022   Morbid obesity (HCC)-start bmi 40.91 06/20/2022   Depression 08/11/2021   Bilateral lower  extremity edema 08/06/2021   Status post nasal septoplasty 03/30/2021   Colon cancer screening    Polyp of colon    Depressed mood 12/01/2020   Nausea without vomiting 11/07/2020   Upper respiratory tract infection 11/07/2020   Thyroid disease 06/09/2020   Cervical strain 04/25/2020   Fatigue 04/04/2020   GAD (generalized anxiety disorder) 02/08/2020   Pericardial pain 01/28/2020   Muscle soreness 01/28/2020   Mixed hyperlipidemia 11/25/2019   Elevated BP without diagnosis of hypertension 11/25/2019   Polyphagia 10/08/2019   Vitamin D deficiency 09/17/2019   Insulin resistance 09/17/2019   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   Class 3 severe obesity with serious comorbidity and body mass index (BMI) of 40.0 to 44.9 in adult [facility]) 09/17/2019   Exercise-induced asthma 04/22/2018   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   Monilial vaginitis 02/26/2017   Decreased libido 02/26/2017   Major depressive disorder, recurrent, in full remission (HCC) 02/26/2017   Dyspareunia, female 02/26/2017   S/P TAH (total abdominal hysterectomy) 07/02/2016   Nevus of abdominal wall 06/11/2016   Morton's neuralgia 07/21/2015   Nocturia 07/21/2015   Stress incontinence 07/21/2015   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  Allergic rhinitis 06/21/2015   Obesity (BMI 30-39.9) 06/21/2015    Past Surgical History:  Procedure Laterality Date   ABDOMINAL HYSTERECTOMY Bilateral 07/02/2016   Procedure: HYSTERECTOMY ABDOMINAL WITH BILATERAL SALPINGECTOMY-MIDLINE INCISION;  Surgeon: Herold Harms, MD;  Location: [facility] ORS;  Service: Gynecology;  Laterality: Bilateral;   CHOLECYSTECTOMY  2009   COLONOSCOPY WITH PROPOFOL N/A 01/26/2021   Procedure: COLONOSCOPY WITH PROPOFOL;  Surgeon: Toney Reil, MD;  Location: [facility] END[facility]COPY;  Service: Gastroenterology;  Laterality: N/A;   fibromalga     HERNIA REPAIR  1974   umbilical   NASAL SEPTOPLASTY W/ TURBINOPLASTY Bilateral 03/03/2021   Procedure: NASAL  SEPTOPLASTY WITH SUBMUCOSAL RESECTION OF TURBINATE;  Surgeon: Linus Salmons, MD;  Location: [facility];  Service: ENT;  Laterality: Bilateral;   SKIN GRAFT  2010   TONSILLECTOMY      OB History     Gravida  0   Para  0   Term  0   Preterm  0   AB  0   Living  0      SAB  0   IAB  0   Ectopic  0   Multiple  0   Live Births  0            Home Medications    Prior to Admission medications   Medication Sig Start Date End Date Taking? Authorizing Provider  albuterol (VENTOLIN HFA) 108 (90 Base) MCG/ACT inhaler TAKE 2 PUFFS BY MOUTH EVERY 6 HOURS AS NEEDED FOR WHEEZE OR SHORTNESS OF BREATH 04/07/21  Yes Covington, Sarah M, PA-C  clobetasol cream (TEMOVATE) 0.05 % Apply 1 application topically 2 (two) times daily. For up to 1-2 weeks then as needed. Do not use on sensitive skin. 04/24/19  Yes Karamalegos, Alexander J, DO  COLLAGEN-BORON-HYALURONIC ACID PO Take by mouth.   Yes [provider]  cyclobenzaprine (FLEXERIL) 10 MG tablet Take 1 tablet (10 mg total) by mouth at bedtime as needed for muscle spasms. 08/25/22  Yes Jennylee Uehara, DO  DULoxetine HCl 40 MG CPEP Take 1 capsule (40 mg total) by mouth daily. 08/29/22  Yes Danford, Katy D, NP  EPINEPHrine 0.3 mg/0.3 mL IJ SOAJ injection 0.3 mg as needed. 09/25/08  Yes [provider]  estradiol (ESTRACE) 1 MG tablet Take 1 tablet (1 mg total) by mouth daily. 08/07/22  Yes Hildred Laser, MD  fluticasone (FLONASE) 50 MCG/ACT nasal spray Place into both nostrils as needed. 05/17/21  Yes [provider]  loratadine (CLARITIN) 10 MG tablet Take 10 mg by mouth daily as needed. 10/13/20  Yes [provider]  Misc Natural Products (OSTEO BI-FLEX ADV TRIPLE ST PO) Take by mouth.   Yes [provider]  Multiple Vitamins-Minerals (MULTIVITAMIN WOMEN PO) Take by mouth.   Yes [provider]  omeprazole (PRILOSEC) 40 MG capsule Take 40 mg by mouth as needed.   Yes [provider]  Probiotic Product (TRUBIOTICS) CAPS Take 1 capsule by mouth daily.   Yes [provider]  progesterone (PROMETRIUM) 200 MG capsule TAKE 1 CAPSULE BY MOUTH EVERY DAY 08/07/22  Yes Hildred Laser, MD  triamcinolone cream (KENALOG) 0.1 % APPLY THIN FILM TWICE DAILY TO ECZEMA UNTIL CLEAR 12/23/18  Yes [provider]  TURMERIC PO Take by mouth daily.   Yes [provider]  VITAMIN D PO Take 1,000 Units by mouth as directed. Twice a week   Yes [provider]  Vitamin D, Ergocalciferol, (DRISDOL) 1.25 MG (50000  UNIT) CAPS capsule TAKE ONE CAPSULE BY MOUTH ONCE PER WEEK 08/29/22  Yes Danford, Jinny Blossom, NP    Family History Family History  Problem Relation Age of Onset   Diabetes Mother    Hyperlipidemia Mother    Hypertension Mother    Sleep apnea Mother    Obesity Mother    Cancer Father        lung CA   Heart disease Maternal Grandmother    Stroke Maternal Grandmother    Hypertension Maternal Grandmother    Diabetes Maternal Grandmother    Heart disease Maternal Grandfather    Diabetes Maternal Grandfather    Heart attack Maternal Grandfather    Breast cancer Neg Hx     Social History Social History   Tobacco Use   Smoking status: Never    Passive exposure: Never   Smokeless tobacco: Never  Vaping Use   Vaping Use: Never used  Substance Use Topics   Alcohol use: Yes    Comment: rare   Drug use: No     Allergies   Iodinated contrast media and Codeine   Review of Systems Review of Systems: negative unless otherwise stated in HPI.      Physical Exam Triage Vital Signs ED Triage Vitals  Enc Vitals Group     BP 10/01/22 1404 135/80     Pulse Rate 10/01/22 1404 (!) 101     Resp 10/01/22 1404 16     Temp 10/01/22 1404 98.5 F (36.9 C)     Temp Source 10/01/22 1404 Oral     SpO2 10/01/22 1404 96 %     Weight 10/01/22 1404 283 lb (128.4 kg)     Height 10/01/22 1404 5\' 9"  (1.753 m)     Head Circumference --      Peak  Flow --      Pain Score 10/01/22 1407 3     Pain Loc --      Pain Edu? --      Excl. in GC? --    No data found.  Updated Vital Signs BP 135/80 (BP Location: Left Arm)   Pulse (!) 101   Temp 98.5 F (36.9 C) (Oral)   Resp 16   Ht 5\' 9"  (1.753 m)   Wt 128.4 kg   LMP 05/26/2016 (Exact Date)   SpO2 96%   BMI 41.79 kg/m   Visual Acuity Right Eye Distance:   Left Eye Distance:   Bilateral Distance:    Right Eye Near:   Left Eye Near:    Bilateral Near:     Physical Exam GEN:     alert, non-toxic appearing female in no distress ***   HENT:  mucus membranes moist, oropharyngeal ***without lesions or ***erythema, no*** tonsillar hypertrophy or exudates, *** moderate erythematous edematous turbinates, ***clear nasal discharge, ***bilateral TM normal EYES:   pupils equal and reactive, ***no scleral injection or discharge NECK:  normal ROM, no ***lymphadenopathy, ***no meningismus   RESP:  no increased work of breathing, ***clear to auscultation bilaterally CVS:   regular rate ***and rhythm Skin:   warm and dry, no rash on visible skin***    UC Treatments / Results  Labs (all labs ordered are listed, but only abnormal results are displayed) Labs Reviewed - No data to display  EKG   Radiology No results found.  Procedures Procedures (including critical care time)  Medications Ordered in UC Medications - No data to display  Initial Impression / Assessment and Plan / UC Course  I have reviewed the triage vital signs and the nursing notes.  Pertinent labs & imaging results that were available during my care of the patient were reviewed by me and considered in my medical decision making (see chart for details).       Pt is a 52 y.o. female who presents for *** days of respiratory symptoms. Juletta is ***afebrile here without recent antipyretics. Satting well on room air. Overall pt is ***non-toxic appearing, well hydrated, without respiratory distress. Pulmonary  exam ***is unremarkable.  COVID testing obtained ***and was negative. ***Pt to quarantine until COVID test results or longer if positive.  I will call patient with test results, if positive. History consistent with ***viral respiratory illness. Discussed symptomatic treatment.  Explained lack of efficacy of antibiotics in viral disease.  Typical duration of symptoms discussed.   Return and ED precautions given and voiced understanding. Discussed MDM, treatment plan and plan for follow-up with patient*** who agrees with plan.     Final Clinical Impressions(s) / UC Diagnoses   Final diagnoses:  None   Discharge Instructions   None    ED Prescriptions   None    PDMP not reviewed this encounter.

## 2022-10-01 NOTE — ED Triage Notes (Signed)
Pt c/o persistent cough,congestion x2 days & sore throat. Dx with Covid on June 26. - Entered by patient. States sx's were improving but returned worse. Denies any fevers.

## 2022-10-01 NOTE — ED Notes (Addendum)
Entered in error 

## 2022-10-01 NOTE — Discharge Instructions (Signed)
Take a home COVID test. Stop by the pharmacy to pick up your prescriptions.  Follow up with your primary care provider as needed.  If your home test is negative and your are not improving in 72 hours, take the antibiotics as prescribed.

## 2022-10-02 ENCOUNTER — Encounter (INDEPENDENT_AMBULATORY_CARE_PROVIDER_SITE_OTHER): Payer: Self-pay | Admitting: Adult Health

## 2022-10-02 ENCOUNTER — Telehealth (INDEPENDENT_AMBULATORY_CARE_PROVIDER_SITE_OTHER): Payer: Self-pay

## 2022-10-02 ENCOUNTER — Telehealth (INDEPENDENT_AMBULATORY_CARE_PROVIDER_SITE_OTHER): Payer: Commercial Managed Care - PPO | Admitting: Adult Health

## 2022-10-02 VITALS — Ht 69.0 in

## 2022-10-02 DIAGNOSIS — Z6841 Body Mass Index (BMI) 40.0 and over, adult: Secondary | ICD-10-CM

## 2022-10-02 DIAGNOSIS — E669 Obesity, unspecified: Secondary | ICD-10-CM

## 2022-10-02 DIAGNOSIS — E559 Vitamin D deficiency, unspecified: Secondary | ICD-10-CM

## 2022-10-02 DIAGNOSIS — U071 COVID-19: Secondary | ICD-10-CM | POA: Diagnosis not present

## 2022-10-02 MED ORDER — VITAMIN D (ERGOCALCIFEROL) 1.25 MG (50000 UNIT) PO CAPS
ORAL_CAPSULE | ORAL | 0 refills | Status: DC
Start: 2022-10-02 — End: 2022-10-29

## 2022-10-02 NOTE — Telephone Encounter (Signed)
Called patient at 7:08 am to go over screening questions and got voicemail. I left a message for her to call me back before her appointment at 8 to go over those screening questions.

## 2022-10-02 NOTE — Progress Notes (Signed)
WEIGHT SUMMARY AND BIOMETRICS  Vitals Temp: 98.5 F (36.9 C) BP: 135/80 Pulse Rate: (!) 101 SpO2: 96 %   Anthropometric Measurements Height: 5\' 9"  (1.753 m) Weight: 283 lb (128.4 kg) BMI (Calculated): 41.77   No data recorded No data recorded  Chief Complaint:   OBESITY Caitlin Barrett is here to discuss her progress with her obesity treatment plan. She is on the the Category 2 Plan and states she is following her eating plan approximately 40 % of the time. She states she is not currently exercising due to acute illness- COVID-19 infection.   Interim History:  Virtual OV accomplished via Telephone- unable to connect via [employer]. Pt confirmed via two pt identifiers  Caitlin Barrett was dx'd with COVID-19 infection She completed course of Paxlovid on 09/24/2022. She has experienced rebound sx's and was seen at local UC 10/01/2022- provided Rx for Azithromycin. She is currently experiencing cough, fatigue, elevated temp, and pharyngitis. Her husband continues to test negative for COVID-19  She has been unable to exercise and has been loosely following her prescribed Cat 2 Meal plan.  Subjective:   1. Vitamin D deficiency  Latest Reference Range & Units 08/28/21 10:07 11/21/21 08:06 05/02/22 08:03  Vitamin D, 25-Hydroxy 30.0 - 100.0 ng/mL 34.9 34.6 45.2  She has been on weekly and twice weekly Ergocalciferol- currently on weekly Ergocalciferol. She endorses fatigue r/t acute COVID  2. COVID-19 09/19/22 UC Notes: Chief Complaint     Chief Complaint  Patient presents with   Sore Throat      I have a sore throat, headache, congestion, fatigue, and fever (100.3). - Entered by patient   Fever   Headache   Cough      HPI Caitlin Barrett is a 52 y.o. female presenting for onset of fever up to 101.3 degrees, fatigue, chills, headache, sore throat, cough, congestion and runny nose yesterday.  Patient denies any sick contacts or recent travel.  She has taken over-the-counter  ibuprofen, Tylenol and cough medication.  Current temp is 99.9 degrees.  She is denying any chest pain or shortness of breath, vomiting or diarrhea.  No other complaints.  Strep and COVID testing obtained.  Strep negative.  COVID-positive.  Discussed results with patient.  She is interested in Paxlovid.  No contraindications.  She recently had CMP performed which shows GFR is 98.  Will prescribe standard dose of Paxlovid.  Reviewed current CDC guidelines, isolation protocol and ED precautions.  Also discussed continuing OTC meds for symptoms including ibuprofen, Tylenol, Mucinex, rest and fluids.  We did offer her a cough medication but she states she is fine taking OTC meds.  She reports she has had COVID twice already.  She is not complaining of any shortness of breath or breathing difficulty but does have a history of asthma.  Advised to use inhaler as needed but if she feels like her breathing is worsening she should return for reevaluation or go to the ER.    Assessment/Plan:   1. Vitamin D deficiency Refill - Vitamin D, Ergocalciferol, (DRISDOL) 1.25 MG (50000 UNIT) CAPS capsule; TAKE ONE CAPSULE BY MOUTH ONCE PER WEEK  Dispense: 8 capsule; Refill: 0  2. COVID-19 -COVID-positive.   Isolate until you have been fever free for 24 hours and feeling better and then wear a mask as long as you are symptomatic.  Use inhaler at home as needed for shortness of breath but if you feel like that is not working, please return to go  to ER. URI/COLD SYMPTOMS: Your exam today is consistent with a viral illness. Antibiotics are not indicated at this time. Use medications as directed, including cough syrup, nasal saline, and decongestants. Your symptoms should improve over the next few days and resolve within 7-10 days. Increase rest and fluids. F/u if symptoms worsen or predominate such as sore throat, ear pain, productive cough, shortness of breath, or if you develop high fevers or worsening fatigue over the next  several days.     [name] is not currently in the action stage of change. As such, her goal is to maintain weight for now. She has agreed to the Category 2 Plan.   Exercise goals: No exercise has been prescribed at this time.  Behavioral modification strategies: increasing lean protein intake, decreasing simple carbohydrates, increasing vegetables, increasing water intake, and no skipping meals.  [name] has agreed to follow-up with our clinic in 4 weeks. She was informed of the importance of frequent follow-up visits to maximize her success with intensive lifestyle modifications for her multiple health conditions.   Objective:   Last menstrual period 05/26/2016. There is no height or weight on file to calculate BMI.  General: Cooperative, alert, well developed, in no acute distress. HEENT: Conjunctivae and lids unremarkable. Cardiovascular: Regular rhythm.  Lungs: Normal work of breathing. Neurologic: No focal deficits.   Lab Results  Component Value Date   CREATININE 0.75 09/11/2022   BUN 13 09/11/2022   NA 138 09/11/2022   K 4.4 09/11/2022   CL 104 09/11/2022   CO2 25 09/11/2022   Lab Results  Component Value Date   ALT 12 09/11/2022   AST 11 09/11/2022   ALKPHOS 57 08/28/2021   BILITOT 0.5 09/11/2022   Lab Results  Component Value Date   HGBA1C 5.2 08/28/2021   HGBA1C 5.3 05/03/2021   HGBA1C 5.3 12/29/2020   HGBA1C 5.3 03/31/2020   HGBA1C 5.5 12/15/2019   Lab Results  Component Value Date   INSULIN 10.7 08/28/2021   INSULIN 17.5 05/03/2021   INSULIN 10.1 12/29/2020   INSULIN 8.9 06/09/2020   INSULIN 12.4 03/31/2020   Lab Results  Component Value Date   TSH 0.083 (L) 03/01/2022   Lab Results  Component Value Date   CHOL 207 (H) 03/01/2022   HDL 40 03/01/2022   LDLCALC 138 (H) 03/01/2022   TRIG 160 (H) 03/01/2022   CHOLHDL 5.2 (H) 03/01/2022   Lab Results  Component Value Date   VD25OH 45.2 05/02/2022   VD25OH 34.6 11/21/2021   VD25OH 34.9  08/28/2021   Lab Results  Component Value Date   WBC 8.0 09/11/2022   HGB 11.6 (L) 09/11/2022   HCT 34.8 (L) 09/11/2022   MCV 89.9 09/11/2022   PLT 332 09/11/2022   Lab Results  Component Value Date   IRON 64 08/13/2019   TIBC 333 08/13/2019   FERRITIN 30 08/13/2019     Attestation Statements:   Reviewed by clinician on day of visit: allergies, medications, problem list, medical history, surgical history, family history, social history, and previous encounter notes.  Time spent on visit including pre-visit chart review and post-visit care and charting was 12 minutes.   I have reviewed the above documentation for accuracy and completeness, and I agree with the above. -  Venus Gilles d. Geni Skorupski, NP-C

## 2022-10-05 ENCOUNTER — Ambulatory Visit: Payer: Commercial Managed Care - PPO | Admitting: Clinical

## 2022-10-24 ENCOUNTER — Encounter (INDEPENDENT_AMBULATORY_CARE_PROVIDER_SITE_OTHER): Payer: Self-pay | Admitting: Adult Health

## 2022-10-25 ENCOUNTER — Ambulatory Visit (INDEPENDENT_AMBULATORY_CARE_PROVIDER_SITE_OTHER): Payer: Commercial Managed Care - PPO | Admitting: Adult Health

## 2022-10-25 NOTE — Telephone Encounter (Signed)
Called patient. Left voicemail to see if she is still wanting to changed to a Mychart Video.

## 2022-10-28 NOTE — Progress Notes (Unsigned)
TeleHealth Visit:  This visit was completed with telemedicine (audio/video) technology. Caitlin Barrett has verbally consented to this TeleHealth visit. The patient is located at home, the provider is located at home. The participants in this visit include the listed provider and patient. The visit was conducted today via MyChart video.  OBESITY Caitlin Barrett is here to discuss her progress with her obesity treatment plan along with follow-up of her obesity related diagnoses.   Today's visit was # 45 Starting weight: 277 lbs Starting date: 08/13/19 Weight at last in office visit: 270 lbs on 08/29/22 Total weight loss: 7 lbs at last in office visit on 08/29/22. Today's reported weight (10/29/22): none reported  Nutrition Plan: the Category 2 plan - 0% adherence.  Current exercise:  none  Interim History:  She is working to get back to plan since recovering from COVID. Appetite has been low.  Had diarrhea and dysgeusia from Paxlovid. Also has rebound from COVID.  She was sick for 3 weeks and she is still experiencing fatigue. Working on increasing protein. Not skipping meals. Lunch is at 2 pm and dinner is 6:30 to 8 pm. Water intake - at least 64 oz per day. Eats out dinner every night-restaurant varies. She and her husband have been talking about trying to cook at home.  Not currently exercising-still suffering fatigue from COVID.  Skipping meals: No Drinking adequate water: Yes Drinking sugar sweetened beverages: No Hunger controlled: well controlled.  Assessment/Plan:  1. Vitamin D Deficiency Vitamin D is not at goal of 50.  Most recent vitamin D level was 45 in February 2024.Marland Kitchen She is on  prescription ergocalciferol 50,000 IU weekly. Lab Results  Component Value Date   VD25OH 45.2 05/02/2022   VD25OH 34.6 11/21/2021   VD25OH 34.9 08/28/2021    Plan: Continue and refill  prescription ergocalciferol 50,000 IU weekly Recheck vitamin D level next office visit.  2. Insulin  Resistance Last fasting insulin was 10.7.. A1c was 5.2. Polyphagia:No Medication(s): None Lab Results  Component Value Date   HGBA1C 5.2 08/28/2021   HGBA1C 5.3 05/03/2021   HGBA1C 5.3 12/29/2020   HGBA1C 5.3 03/31/2020   HGBA1C 5.5 12/15/2019   Lab Results  Component Value Date   INSULIN 10.7 08/28/2021   INSULIN 17.5 05/03/2021   INSULIN 10.1 12/29/2020   INSULIN 8.9 06/09/2020   INSULIN 12.4 03/31/2020    Plan: Recheck fasting insulin and A1c next office visit.  3. Generalized Obesity: Current BMI 39  Caitlin Barrett is currently in the action stage of change. As such, her goal is to continue with weight loss efforts.  She has agreed to the Category 2 plan.  1.  Encouraged her to weigh her meat for her sandwich at lunch. 2.  Encouraged cooking at home at least 2-3 times per week-cook enough for leftovers.  Exercise goals: Increase exercise as tolerated.  Behavioral modification strategies: increasing lean protein intake, decreasing simple carbohydrates , decrease eating out, meal planning , and planning for success.  Caitlin Barrett has agreed to follow-up with our clinic in 4 weeks with fasting labs.  No orders of the defined types were placed in this encounter.   Medications Discontinued During This Encounter  Medication Reason   Vitamin D, Ergocalciferol, (DRISDOL) 1.25 MG (50000 UNIT) CAPS capsule Reorder     Meds ordered this encounter  Medications   Vitamin D, Ergocalciferol, (DRISDOL) 1.25 MG (50000 UNIT) CAPS capsule    Sig: TAKE ONE CAPSULE BY MOUTH ONCE PER WEEK    Dispense:  8 capsule  Refill:  0    Order Specific Question:   Supervising Provider    Answer:   Glennis Brink [[id number]]      Objective:   VITALS: Per patient if applicable, see vitals. GENERAL: Alert and in no acute distress. CARDIOPULMONARY: No increased WOB. Speaking in clear sentences.  PSYCH: Pleasant and cooperative. Speech normal rate and rhythm. Affect is appropriate. Insight and  judgement are appropriate. Attention is focused, linear, and appropriate.  NEURO: Oriented as arrived to appointment on time with no prompting.   Attestation Statements:   Reviewed by clinician on day of visit: allergies, medications, problem list, medical history, surgical history, family history, social history, and previous encounter notes.   This was prepared with the assistance of Engineer, civil (consulting).  Occasional wrong-word or sound-a-like substitutions may have occurred due to the inherent limitations of voice recognition software.

## 2022-10-29 ENCOUNTER — Encounter (INDEPENDENT_AMBULATORY_CARE_PROVIDER_SITE_OTHER): Payer: Self-pay | Admitting: Family Medicine

## 2022-10-29 ENCOUNTER — Other Ambulatory Visit (INDEPENDENT_AMBULATORY_CARE_PROVIDER_SITE_OTHER): Payer: Self-pay | Admitting: Adult Health

## 2022-10-29 ENCOUNTER — Telehealth (INDEPENDENT_AMBULATORY_CARE_PROVIDER_SITE_OTHER): Payer: Commercial Managed Care - PPO | Admitting: Family Medicine

## 2022-10-29 DIAGNOSIS — E88819 Insulin resistance, unspecified: Secondary | ICD-10-CM | POA: Diagnosis not present

## 2022-10-29 DIAGNOSIS — E559 Vitamin D deficiency, unspecified: Secondary | ICD-10-CM

## 2022-10-29 DIAGNOSIS — E669 Obesity, unspecified: Secondary | ICD-10-CM

## 2022-10-29 DIAGNOSIS — Z6839 Body mass index (BMI) 39.0-39.9, adult: Secondary | ICD-10-CM

## 2022-10-29 MED ORDER — VITAMIN D (ERGOCALCIFEROL) 1.25 MG (50000 UNIT) PO CAPS
ORAL_CAPSULE | ORAL | 0 refills | Status: DC
Start: 2022-10-29 — End: 2023-01-17

## 2022-11-23 ENCOUNTER — Encounter: Payer: Self-pay | Admitting: Family Medicine

## 2022-11-23 DIAGNOSIS — G43909 Migraine, unspecified, not intractable, without status migrainosus: Secondary | ICD-10-CM

## 2022-11-28 MED ORDER — RIZATRIPTAN BENZOATE 10 MG PO TBDP
10.0000 mg | ORAL_TABLET | ORAL | 1 refills | Status: DC | PRN
Start: 2022-11-28 — End: 2023-05-21

## 2022-11-28 NOTE — Addendum Note (Signed)
Addended by: Smitty Cords on: 11/28/2022 05:47 PM   Modules accepted: Orders

## 2022-11-29 ENCOUNTER — Ambulatory Visit (INDEPENDENT_AMBULATORY_CARE_PROVIDER_SITE_OTHER): Payer: Commercial Managed Care - PPO | Admitting: Adult Health

## 2022-11-29 ENCOUNTER — Telehealth (INDEPENDENT_AMBULATORY_CARE_PROVIDER_SITE_OTHER): Payer: Self-pay

## 2022-11-29 ENCOUNTER — Other Ambulatory Visit (INDEPENDENT_AMBULATORY_CARE_PROVIDER_SITE_OTHER): Payer: Self-pay | Admitting: Adult Health

## 2022-11-29 ENCOUNTER — Encounter (INDEPENDENT_AMBULATORY_CARE_PROVIDER_SITE_OTHER): Payer: Self-pay | Admitting: Adult Health

## 2022-11-29 VITALS — BP 138/85 | HR 76 | Temp 98.2°F | Ht 69.0 in | Wt 276.0 lb

## 2022-11-29 DIAGNOSIS — E782 Mixed hyperlipidemia: Secondary | ICD-10-CM | POA: Diagnosis not present

## 2022-11-29 DIAGNOSIS — E559 Vitamin D deficiency, unspecified: Secondary | ICD-10-CM | POA: Diagnosis not present

## 2022-11-29 DIAGNOSIS — E88819 Insulin resistance, unspecified: Secondary | ICD-10-CM

## 2022-11-29 DIAGNOSIS — Z6841 Body Mass Index (BMI) 40.0 and over, adult: Secondary | ICD-10-CM

## 2022-11-29 MED ORDER — ZEPBOUND 2.5 MG/0.5ML ~~LOC~~ SOAJ: 2.5000 mg | SUBCUTANEOUS | 0 refills | Status: DC

## 2022-11-29 NOTE — Telephone Encounter (Signed)
Submitted a PA for Zepbound, waiting on determination. 8:46 11/29/22 KP

## 2022-11-29 NOTE — Telephone Encounter (Signed)
PA for Zepbound has been denied:  The requested medication is not covered by the plan. Medications used for weight loss are not covered under your health plan's pharmacy coverage.  9:52 11/29/22 KP

## 2022-11-29 NOTE — Progress Notes (Signed)
WEIGHT SUMMARY AND BIOMETRICS  Vitals Temp: 98.2 F (36.8 C) BP: 138/85 Pulse Rate: 76 SpO2: 98 %   Anthropometric Measurements Height: 5\' 9"  (1.753 m) Weight: 276 lb (125.2 kg) BMI (Calculated): 40.74 Weight at Last Visit: 270lb Weight Lost Since Last Visit: 0 Weight Gained Since Last Visit: 6lb Starting Weight: 277lb Total Weight Loss (lbs): 2 lb (0.907 kg)   Body Composition  Body Fat %: 49.3 % Fat Mass (lbs): 136.2 lbs Muscle Mass (lbs): 133 lbs Total Body Water (lbs): 98.2 lbs Visceral Fat Rating : 15   Other Clinical Data Fasting: yes Labs: yes Today's Visit #: 85 Starting Date: 08/13/19    Chief Complaint:   OBESITY Caitlin Barrett is here to discuss her progress with her obesity treatment plan. She is on the the Category 2 Plan and states she is following her eating plan approximately 65 % of the time. She states she is  not currently exercising - lingering fatigue from COVID-19 infection.   Interim History:  09/19/2022 COVID-19 + This is her 4th infection with COVID-19 virus  She was treated with Paxlovid. After completing course of antiviral - experienced rebound infection.  She reports persistent fatigue and dyspnea with exertion.  She denies fever or congestion.  She and her twin sister celebrated their 50th birthdqay over the weekend!  She and her husband will travel to [place] at end of Sept to celebrate their 22th wedding anniversary!  Subjective:   1. Vitamin D deficiency  Latest Reference Range & Units 08/28/21 10:07 11/21/21 08:06 05/02/22 08:03  Vitamin D, 25-Hydroxy 30.0 - 100.0 ng/mL 34.9 34.6 45.2   She is on weekly Ergocalciferol and OTC Vit D 3 1,000 international units 6 days a weeks.   2. Insulin resistance  Latest Reference Range & Units 05/03/21 08:03 08/28/21 10:07  INSULIN 2.6 - 24.9 uIU/mL 17.5 10.7   Lab Results  Component Value Date   HGBA1C 5.2 08/28/2021   HGBA1C 5.3 05/03/2021   HGBA1C 5.3 12/29/2020     Metformin intolerant, re: Bilateral lower extremity edema She denies family hx of MEN 2 or MTC She denies personal hx of pancreatitis. She had hysterectomy in 2018  Discussed risks/benefits of Zepbound therapy- she is agreeable to start  3. Mixed hyperlipidemia Lipid Panel     Component Value Date/Time   CHOL 207 (H) 03/01/2022 0813   TRIG 160 (H) 03/01/2022 0813   HDL 40 03/01/2022 0813   CHOLHDL 5.2 (H) 03/01/2022 0813   CHOLHDL 4.8 10/23/2017 0830   VLDL 19 10/01/2016 0835   LDLCALC 138 (H) 03/01/2022 0813   LDLCALC 128 (H) 10/23/2017 0830   LABVLDL 29 03/01/2022 0813    The 10-year ASCVD risk score (Arnett DK, et al., 2019) is: 2.2%   Values used to calculate the score:     Age: 52 years     Sex: Female     Is Non-Hispanic African American: No     Diabetic: No     Tobacco smoker: No     Systolic Blood Pressure: 138 mmHg     Is BP treated: No     HDL Cholesterol: 40 mg/dL     Total Cholesterol: 207 mg/dL   She has never been on stating therapy.  Assessment/Plan:   1. Vitamin D deficiency Check Labs - VITAMIN D 25 Hydroxy (Vit-D Deficiency, Fractures)  2. Insulin resistance Check Labs - Hemoglobin A1c - Insulin, random - Vitamin B12  3. Mixed hyperlipidemia Check Labs - Comprehensive  metabolic panel - Lipid panel  4. Morbid obesity (HCC), Current BMI 40.74 Start tirzepatide (ZEPBOUND) 2.5 MG/0.5ML Pen Inject 2.5 mg into the skin once a week. Dispense: 3 mL, Refills: 0 ordered   Explained that Rx will require PA  Caitlin Barrett is currently in the action stage of change. As such, her goal is to continue with weight loss efforts. She has agreed to the Category 2 Plan.   Exercise goals: All adults should avoid inactivity. Some physical activity is better than none, and adults who participate in any amount of physical activity gain some health benefits.  Behavioral modification strategies: increasing lean protein intake, decreasing simple carbohydrates,  increasing vegetables, increasing water intake, and planning for success.  Caitlin Barrett has agreed to follow-up with our clinic in 4 weeks. She was informed of the importance of frequent follow-up visits to maximize her success with intensive lifestyle modifications for her multiple health conditions.   Caitlin Barrett was informed we would discuss her lab results at her next visit unless there is a critical issue that needs to be addressed sooner. Caitlin Barrett agreed to keep her next visit at the agreed upon time to discuss these results.  Objective:   Blood pressure 138/85, pulse 76, temperature 98.2 F (36.8 C), height 5\' 9"  (1.753 m), weight 276 lb (125.2 kg), last menstrual period 05/26/2016, SpO2 98%. Body mass index is 40.76 kg/m.  General: Cooperative, alert, well developed, in no acute distress. HEENT: Conjunctivae and lids unremarkable. Cardiovascular: Regular rhythm.  Lungs: Normal work of breathing. Neurologic: No focal deficits.   Lab Results  Component Value Date   CREATININE 0.75 09/11/2022   BUN 13 09/11/2022   NA 138 09/11/2022   K 4.4 09/11/2022   CL 104 09/11/2022   CO2 25 09/11/2022   Lab Results  Component Value Date   ALT 12 09/11/2022   AST 11 09/11/2022   ALKPHOS 57 08/28/2021   BILITOT 0.5 09/11/2022   Lab Results  Component Value Date   HGBA1C 5.2 08/28/2021   HGBA1C 5.3 05/03/2021   HGBA1C 5.3 12/29/2020   HGBA1C 5.3 03/31/2020   HGBA1C 5.5 12/15/2019   Lab Results  Component Value Date   INSULIN 10.7 08/28/2021   INSULIN 17.5 05/03/2021   INSULIN 10.1 12/29/2020   INSULIN 8.9 06/09/2020   INSULIN 12.4 03/31/2020   Lab Results  Component Value Date   TSH 0.083 (L) 03/01/2022   Lab Results  Component Value Date   CHOL 207 (H) 03/01/2022   HDL 40 03/01/2022   LDLCALC 138 (H) 03/01/2022   TRIG 160 (H) 03/01/2022   CHOLHDL 5.2 (H) 03/01/2022   Lab Results  Component Value Date   VD25OH 45.2 05/02/2022   VD25OH 34.6 11/21/2021   VD25OH 34.9  08/28/2021   Lab Results  Component Value Date   WBC 8.0 09/11/2022   HGB 11.6 (L) 09/11/2022   HCT 34.8 (L) 09/11/2022   MCV 89.9 09/11/2022   PLT 332 09/11/2022   Lab Results  Component Value Date   IRON 64 08/13/2019   TIBC 333 08/13/2019   FERRITIN 30 08/13/2019   Attestation Statements:   Reviewed by clinician on day of visit: allergies, medications, problem list, medical history, surgical history, family history, social history, and previous encounter notes.  I have reviewed the above documentation for accuracy and completeness, and I agree with the above. -  [place] Neuroth d. Jemarion Roycroft, NP-C

## 2022-11-30 LAB — COMPREHENSIVE METABOLIC PANEL
ALT: 9 IU/L (ref 0–32)
AST: 11 IU/L (ref 0–40)
Albumin: 4.1 g/dL (ref 3.9–4.9)
Alkaline Phosphatase: 56 IU/L (ref 44–121)
BUN/Creatinine Ratio: 19 (ref 9–23)
BUN: 15 mg/dL (ref 6–24)
Bilirubin Total: 0.2 mg/dL (ref 0.0–1.2)
CO2: 21 mmol/L (ref 20–29)
Calcium: 9.1 mg/dL (ref 8.7–10.2)
Chloride: 102 mmol/L (ref 96–106)
Creatinine, Ser: 0.78 mg/dL (ref 0.57–1.00)
Globulin, Total: 2.2 g/dL (ref 1.5–4.5)
Glucose: 93 mg/dL (ref 70–99)
Potassium: 4.5 mmol/L (ref 3.5–5.2)
Sodium: 137 mmol/L (ref 134–144)
Total Protein: 6.3 g/dL (ref 6.0–8.5)
eGFR: 92 mL/min/{1.73_m2} (ref >59)

## 2022-11-30 LAB — LIPID PANEL
Chol/HDL Ratio: 5 ratio — ABNORMAL HIGH (ref 0.0–4.4)
Cholesterol, Total: 212 mg/dL — ABNORMAL HIGH (ref 100–199)
HDL: 42 mg/dL (ref >39)
LDL Chol Calc (NIH): 143 mg/dL — ABNORMAL HIGH (ref 0–99)
Triglycerides: 149 mg/dL (ref 0–149)
VLDL Cholesterol Cal: 27 mg/dL (ref 5–40)

## 2022-11-30 LAB — HEMOGLOBIN A1C
Est. average glucose Bld gHb Est-mCnc: 108 mg/dL
Hgb A1c MFr Bld: 5.4 % (ref 4.8–5.6)

## 2022-11-30 LAB — VITAMIN B12: Vitamin B-12: 237 pg/mL (ref 232–1245)

## 2022-11-30 LAB — VITAMIN D 25 HYDROXY (VIT D DEFICIENCY, FRACTURES): Vit D, 25-Hydroxy: 65.4 ng/mL (ref 30.0–100.0)

## 2022-11-30 LAB — INSULIN, RANDOM: INSULIN: 17.1 u[IU]/mL (ref 2.6–24.9)

## 2022-12-14 ENCOUNTER — Encounter (INDEPENDENT_AMBULATORY_CARE_PROVIDER_SITE_OTHER): Payer: Self-pay | Admitting: Adult Health

## 2022-12-17 ENCOUNTER — Encounter (INDEPENDENT_AMBULATORY_CARE_PROVIDER_SITE_OTHER): Payer: Self-pay | Admitting: Adult Health

## 2022-12-27 ENCOUNTER — Ambulatory Visit (INDEPENDENT_AMBULATORY_CARE_PROVIDER_SITE_OTHER): Payer: Commercial Managed Care - PPO | Admitting: Adult Health

## 2022-12-28 NOTE — Progress Notes (Unsigned)
Office Visit Note  Patient: Caitlin Barrett             Date of Birth: [birth year]           MRN: [id number]             PCP: Smitty Cords, DO Referring: Saralyn Pilar * Visit Date: 01/10/2023 Occupation: @GUAROCC @  Subjective:  Pain in both legs   History of Present Illness: Caitlin Barrett is a 52 y.o. female with history of myofascial pain and osteoarthritis.  Patient remains on Cymbalta 40 mg 1 capsule by mouth daily.  She has been taking Flexeril 10 mg at bedtime for muscle spasms and insomnia.  Patient states over the past 2 to 3 months she has been experiencing increased muscle fatigue and pain in both lower extremities.  She has interrupted sleep at night due to nocturnal pain especially when lying on her sides.  She is also been having some increased discomfort in her lower back.  She denies any increased muscle weakness or muscle spasms.  She takes Tylenol and ibuprofen as needed for pain relief. She denies any joint swelling but has ongoing stiffness in both knees.   Activities of Daily Living:  Patient reports morning stiffness for 2-3 hours.   Patient Reports nocturnal pain.  Difficulty dressing/grooming: Denies Difficulty climbing stairs: Reports Difficulty getting out of chair: Reports Difficulty using hands for taps, buttons, cutlery, and/or writing: Denies  Review of Systems  Constitutional:  Positive for fatigue.  HENT:  Negative for mouth sores and mouth dryness.   Eyes:  Negative for dryness.  Respiratory:  Negative for cough and wheezing.   Cardiovascular:  Positive for swelling in legs/feet. Negative for chest pain and palpitations.  Gastrointestinal:  Positive for blood in stool and constipation. Negative for diarrhea.  Endocrine: Negative for increased urination.  Genitourinary:  Negative for painful urination and involuntary urination.  Musculoskeletal:  Positive for joint pain, gait problem, joint pain, myalgias, muscle weakness,  morning stiffness, muscle tenderness and myalgias. Negative for joint swelling.  Skin:  Positive for sensitivity to sunlight. Negative for color change, rash and hair loss.  Allergic/Immunologic: Positive for susceptible to infections.  Neurological:  Positive for headaches. Negative for dizziness.  Hematological:  Negative for swollen glands.  Psychiatric/Behavioral:  Positive for depressed mood and sleep disturbance. The patient is nervous/anxious.     PMFS History:  Patient Active Problem List   Diagnosis Date Noted   BMI 39.0-39.9,adult-current bmi 39.4 06/20/2022   Morbid obesity (HCC)-start bmi 40.91 06/20/2022   Depression 08/11/2021   Bilateral lower extremity edema 08/06/2021   Status post nasal septoplasty 03/30/2021   Colon cancer screening    Polyp of colon    Depressed mood 12/01/2020   Nausea without vomiting 11/07/2020   Upper respiratory tract infection 11/07/2020   Thyroid disease 06/09/2020   Cervical strain 04/25/2020   Fatigue 04/04/2020   GAD (generalized anxiety disorder) 02/08/2020   Pericardial pain 01/28/2020   Muscle soreness 01/28/2020   Mixed hyperlipidemia 11/25/2019   Elevated BP without diagnosis of hypertension 11/25/2019   Polyphagia 10/08/2019   Vitamin D deficiency 09/17/2019   Insulin resistance 09/17/2019   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   Class 3 severe obesity with serious comorbidity and body mass index (BMI) of 40.0 to 44.9 in adult [facility]) 09/17/2019   Exercise-induced asthma 04/22/2018   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  Takes allergy shots at [facility]   Anemia    Anxiety    Arthritis    Asthma    exercise induced--no inhalers   Back pain    Cancer (HCC) 2010   Basal cell skin cancer Lt eye, skin graft above Lt eye   Constipation    Costochondritis    Depression    Dysmenorrhea    Eczema    Edema, lower extremity    GERD (gastroesophageal reflux disease)    Headache    Hypothyroid    Nodules and Fine needle aspiration   IBS (irritable bowel syndrome)    Lab test positive for detection of COVID-19 virus    November 2020   Myalgia    Osteoarthritis    Painful menstrual periods    Palpitations    Plantar fasciitis    Shortness of breath    Sprain of carpal (joint) of wrist    Vitamin B 12 deficiency    Vitamin D deficiency     Family History  Problem Relation Age of Onset   Diabetes Mother    Hyperlipidemia Mother    Hypertension Mother    Sleep apnea Mother    Obesity Mother    Cancer Father        lung CA   Heart disease Maternal Grandmother    Stroke Maternal Grandmother    Hypertension Maternal Grandmother    Diabetes Maternal Grandmother    Heart disease Maternal Grandfather    Diabetes Maternal Grandfather    Heart attack Maternal Grandfather    Breast cancer Neg Hx    Past Surgical History:  Procedure Laterality Date   ABDOMINAL HYSTERECTOMY Bilateral 07/02/2016   Procedure: HYSTERECTOMY ABDOMINAL WITH BILATERAL SALPINGECTOMY-MIDLINE INCISION;  Surgeon: Herold Harms, MD;  Location: [facility];  Service: Gynecology;  Laterality: Bilateral;   CHOLECYSTECTOMY  2009   COLONOSCOPY WITH PROPOFOL N/A 01/26/2021   Procedure: COLONOSCOPY WITH PROPOFOL;  Surgeon: Toney Reil, MD;  Location: [facility];  Service: Gastroenterology;  Laterality: N/A;   fibromalga     HERNIA REPAIR  1974   umbilical   NASAL SEPTOPLASTY W/ TURBINOPLASTY Bilateral  03/03/2021   Procedure: NASAL SEPTOPLASTY WITH SUBMUCOSAL RESECTION OF TURBINATE;  Surgeon: Linus Salmons, MD;  Location: [facility];  Service: ENT;  Laterality: Bilateral;   SKIN GRAFT  2010   TONSILLECTOMY     Social History   Social History Narrative   Not on file   Immunization History  Administered Date(s) Administered   Influenza Inj Mdck Quad Pf 01/29/2019   Influenza Nasal 01/16/2018   Influenza-Unspecified 01/25/2020   Tdap 06/14/2017     Objective: Vital Signs: BP (!) 147/90 (BP Location: Left Arm, Patient Position: Sitting, Cuff Size: Large)   Pulse 82   Resp 17   Ht 5\' 9"  (1.753 m)   Wt 288 lb 6.4 oz (130.8 kg)   LMP 05/26/2016 (Exact Date)   BMI 42.59 kg/m    Physical Exam Vitals and nursing note reviewed.  Constitutional:      Appearance: She is well-developed.  HENT:     Head: Normocephalic and atraumatic.  Eyes:     Conjunctiva/sclera: Conjunctivae normal.  Cardiovascular:     Rate and Rhythm: Normal rate and regular rhythm.     Heart sounds: Normal heart sounds.  Pulmonary:     Effort: Pulmonary effort is normal.     Breath sounds: Normal breath sounds.  Abdominal:     General: Bowel sounds are normal.     Palpations: Abdomen is soft.  Musculoskeletal:     Cervical back: Normal range of motion.  Lymphadenopathy:     Cervical: No cervical adenopathy.  Skin:    General: Skin is warm and dry.     Capillary Refill: Capillary refill takes less than 2 seconds.  Neurological:     Mental Status: She is alert and oriented to person, place, and time.  Psychiatric:        Behavior: Behavior normal.      Musculoskeletal Exam: Generalized hyperalgesia and positive tender points on exam.  C-spine, thoracic spine, lumbar spine have good range of motion.  Some discomfort with range of motion of the lumbar spine.  Trapezius muscle tension and tenderness bilaterally.  Shoulder joints, elbow joints, wrist joints, MCPs, PIPs, DIPs have good range of  motion with no synovitis.  Complete fist formation bilaterally.  Hip joints have good range of motion with no groin pain.  Tenderness over the trochanteric bursa on the right side as well as along the IT band.  Knee joints have good range of motion with no warmth or effusion.  Ankle joints have good range of motion with no tenderness or joint swelling.  CDAI Exam: CDAI Score: -- Patient Global: --; Provider Global: -- Swollen: --; Tender: -- Joint Exam 01/10/2023   No joint exam has been documented for this visit   There is currently no information documented on the homunculus. Go to the Rheumatology activity and complete the homunculus joint exam.  Investigation: No additional findings.  Imaging: No results found.  Recent Labs: Lab Results  Component Value Date   WBC 8.0 09/11/2022   HGB 11.6 (L) 09/11/2022   PLT 332 09/11/2022   NA 137 11/29/2022   K 4.5 11/29/2022   CL 102 11/29/2022   CO2 21 11/29/2022   GLUCOSE 93 11/29/2022   BUN 15 11/29/2022   CREATININE 0.78 11/29/2022   BILITOT 0.2 11/29/2022   ALKPHOS 56 11/29/2022   AST 11 11/29/2022   ALT 9 11/29/2022   PROT 6.3 11/29/2022   ALBUMIN 4.1 11/29/2022   CALCIUM 9.1 11/29/2022   GFRAA 89 03/31/2020    Speciality Comments: No specialty comments available.  Procedures:  Large Joint Inj: R greater trochanter on 01/10/2023 3:40 PM Indications: pain Details: 27 G 1.5 in needle, lateral approach  Arthrogram: No  Medications: 1.5 mL lidocaine 1 %; 40 mg triamcinolone acetonide 40 MG/ML Aspirate: 0 mL Outcome: tolerated well, no immediate complications Procedure, treatment alternatives, risks and benefits explained, specific risks discussed. Consent was given by the patient. Immediately prior to procedure a time out was called to verify the correct patient, procedure, equipment, support staff and site/side marked as required. Patient was prepped and draped in the usual sterile fashion.     Allergies: Iodinated  contrast media and Codeine    Assessment / Plan:     Visit Diagnoses: Myofascial pain - Autoimmune work-up negative 04/28/2019: She has generalized hyperalgesia and positive tender points on exam.  She has been experiencing increased muscle fatigue and myofascial pain in  bilateral legs.  She has noticed increased nocturnal pain.  No muscular weakness was noted on examination today.  No muscular atrophy noted. She has been taking Cymbalta 40 mg daily and Flexeril 10 mg at bedtime for muscle spasms and insomnia.  Discussed the option of increasing Cymbalta to 60 mg daily--she plans on further discussing with the prescribing provider next week. Discussed the benefits of myofascial release/massage.  She may also benefit from using a heating pad or a heated blanket.   For management of her right hip pain a right trochanteric bursa cortisone injection was performed today.  She tolerated the procedure well.  Procedure was completed above.  Aftercare was discussed.  She was also given handout of exercises to perform.  She was advised to notify us if her symptoms persist or worsen.  She will follow up in 6 months or sooner or needed.  - Plan: Sedimentation rate, VITAMIN D 25 Hydroxy (Vit-D Deficiency, Fractures), CK, TSH, Magnesium, C-reactive protein  Chronic right shoulder pain: She continues to experience intermittent discomfort in the right shoulder.  She has right trapezius muscle tension and tenderness.  Trochanteric bursitis of both hips: Patient has tenderness palpation over bilateral trochanteric bursa, right greater than left.  She also some tenderness along the right IT band.  Different treatment options were discussed today in detail.  After informed consent the right trochanteric bursa was injected with cortisone.  She tolerated the procedure well.  Procedure note was completed above.  Aftercare was discussed.  She was advised to notify us if her symptoms persist or worsen.  Once her symptoms have  improved she was encouraged to start performing stretching exercises daily.  She was given a handout of these exercises to perform.  Muscle fatigue - She has been experiencing increased muscle soreness and muscle fatigue for the past 2 to 3 months.  The patient describes a sensation as if she has overexerted or worked out when she has not.  She has not noticed any muscular weakness and has not been experiencing any muscle cramping or spasms.  She has been experiencing nocturnal pain in both legs.  Plan to obtain the following lab work today for further evaluation.  Plan: Sedimentation rate, VITAMIN D 25 Hydroxy (Vit-D Deficiency, Fractures), CK, TSH, Magnesium, C-reactive protein  Vitamin D deficiency -vitamin D will be checked today.  Plan: VITAMIN D 25 Hydroxy (Vit-D Deficiency, Fractures)  Chronic SI joint pain: Intermittent discomfort.   Primary osteoarthritis of left knee - Moderate osteoarthritis and moderate chondromalacia patella.  Evaluated at [facility] in the past.  Patient continues to experience intermittent discomfort involving both knee joints.  She has knee crepitus with range of motion but no warmth or effusion noted today. Patient was given a list of natural anti-inflammatories to start taking.  Other medical conditions are listed as follows:   Morton's neuroma of right foot  Basal cell carcinoma (BCC) of skin of other part of face  Exercise-induced asthma  Anxiety with depression  Central hypothyroidism  History of IBS  Family history of psoriasis in sister    Orders: Orders Placed This Encounter  Procedures   Large Joint Inj   Sedimentation rate   VITAMIN D 25 Hydroxy (Vit-D Deficiency, Fractures)   CK   TSH   Magnesium   C-reactive protein   No orders of the defined types were placed in this encounter.    Follow-Up Instructions: Return in about 6 months (around 07/11/2023) for Myofascial pain, Osteoarthritis.   Orie Fisherman  Amada Jupiter, PA-C  Note -  This record has been created using [employer].  Chart creation errors have been sought, but may not always  have been located. Such creation errors do not reflect on  the standard of medical care.

## 2023-01-10 ENCOUNTER — Encounter: Payer: Self-pay | Admitting: Physician Assistant

## 2023-01-10 ENCOUNTER — Ambulatory Visit: Payer: Commercial Managed Care - PPO | Attending: Physician Assistant | Admitting: Physician Assistant

## 2023-01-10 VITALS — BP 147/90 | HR 82 | Resp 17 | Ht 69.0 in | Wt 288.4 lb

## 2023-01-10 DIAGNOSIS — J45990 Exercise induced bronchospasm: Secondary | ICD-10-CM

## 2023-01-10 DIAGNOSIS — E038 Other specified hypothyroidism: Secondary | ICD-10-CM

## 2023-01-10 DIAGNOSIS — M25511 Pain in right shoulder: Secondary | ICD-10-CM | POA: Diagnosis not present

## 2023-01-10 DIAGNOSIS — M533 Sacrococcygeal disorders, not elsewhere classified: Secondary | ICD-10-CM

## 2023-01-10 DIAGNOSIS — Z840 Family history of diseases of the skin and subcutaneous tissue: Secondary | ICD-10-CM

## 2023-01-10 DIAGNOSIS — M1712 Unilateral primary osteoarthritis, left knee: Secondary | ICD-10-CM

## 2023-01-10 DIAGNOSIS — M7062 Trochanteric bursitis, left hip: Secondary | ICD-10-CM

## 2023-01-10 DIAGNOSIS — F418 Other specified anxiety disorders: Secondary | ICD-10-CM

## 2023-01-10 DIAGNOSIS — G8929 Other chronic pain: Secondary | ICD-10-CM

## 2023-01-10 DIAGNOSIS — M7918 Myalgia, other site: Secondary | ICD-10-CM

## 2023-01-10 DIAGNOSIS — C44319 Basal cell carcinoma of skin of other parts of face: Secondary | ICD-10-CM

## 2023-01-10 DIAGNOSIS — G5761 Lesion of plantar nerve, right lower limb: Secondary | ICD-10-CM

## 2023-01-10 DIAGNOSIS — M6289 Other specified disorders of muscle: Secondary | ICD-10-CM

## 2023-01-10 DIAGNOSIS — M7061 Trochanteric bursitis, right hip: Secondary | ICD-10-CM | POA: Diagnosis not present

## 2023-01-10 DIAGNOSIS — E559 Vitamin D deficiency, unspecified: Secondary | ICD-10-CM

## 2023-01-10 DIAGNOSIS — Z8719 Personal history of other diseases of the digestive system: Secondary | ICD-10-CM

## 2023-01-10 MED ORDER — LIDOCAINE HCL 1 % IJ SOLN
1.5000 mL | INTRAMUSCULAR | Status: AC | PRN
Start: 2023-01-10 — End: 2023-01-10
  Administered 2023-01-10: 1.5 mL

## 2023-01-10 MED ORDER — TRIAMCINOLONE ACETONIDE 40 MG/ML IJ SUSP
40.0000 mg | INTRAMUSCULAR | Status: AC | PRN
Start: 2023-01-10 — End: 2023-01-10
  Administered 2023-01-10: 40 mg via INTRA_ARTICULAR

## 2023-01-10 NOTE — Patient Instructions (Signed)
Hip Bursitis Rehab Ask your health care provider which exercises are safe for you. Do exercises exactly as told by your health care provider and adjust them as directed. It is normal to feel mild stretching, pulling, tightness, or discomfort as you do these exercises. Stop right away if you feel sudden pain or your pain gets worse. Do not begin these exercises until told by your health care provider. Stretching exercise This exercise warms up your muscles and joints and improves the movement and flexibility of your hip. This exercise also helps to relieve pain and stiffness. Iliotibial band stretch An iliotibial band is a strong band of muscle tissue that runs from the outer side of your hip to the outer side of your thigh and knee. Lie on your side with your left / right leg in the top position. Bend your left / right knee and grab your ankle. Stretch out your bottom arm to help you balance. Slowly bring your knee back so your thigh is slightly behind your body. Slowly lower your knee toward the floor until you feel a gentle stretch on the outside of your left / right thigh. If you do not feel a stretch and your knee will not lower more toward the floor, place the heel of your other foot on top of your knee and pull your knee down toward the floor with your foot. Hold this position for __________ seconds. Slowly return to the starting position. Repeat __________ times. Complete this exercise __________ times a day. Strengthening exercises These exercises build strength and endurance in your hip and pelvis. Endurance is the ability to use your muscles for a long time, even after they get tired. Bridge This exercise strengthens the muscles that move your thigh backward (hip extensors). Lie on your back on a firm surface with your knees bent and your feet flat on the floor. Tighten your buttocks muscles and lift your buttocks off the floor until your trunk is level with your thighs. Do not arch your  back. You should feel the muscles working in your buttocks and the back of your thighs. If you do not feel these muscles, slide your feet 1-2 inches (2.5-5 cm) farther away from your buttocks. If this exercise is too easy, try doing it with your arms crossed over your chest. Hold this position for __________ seconds. Slowly lower your hips to the starting position. Let your muscles relax completely after each repetition. Repeat __________ times. Complete this exercise __________ times a day. Squats This exercise strengthens the muscles in front of your thigh and knee (quadriceps). Stand in front of a table, with your feet and knees pointing straight ahead. You may rest your hands on the table for balance but not for support. Slowly bend your knees and lower your hips like you are going to sit in a chair. Keep your weight over your heels, not over your toes. Keep your lower legs upright so they are parallel with the table legs. Do not let your hips go lower than your knees. Do not bend lower than told by your health care provider. If your hip pain increases, do not bend as low. Hold the squat position for __________ seconds. Slowly push with your legs to return to standing. Do not use your hands to pull yourself to standing. Repeat __________ times. Complete this exercise __________ times a day. Hip hike  Stand sideways on a bottom step. Stand on your left / right leg with your other foot unsupported next to  the step. You can hold on to the railing or wall for balance if needed. Keep your knees straight and your torso square. Then lift your left / right hip up toward the ceiling. Hold this position for __________ seconds. Slowly let your left / right hip lower toward the floor, past the starting position. Your foot should get closer to the floor. Do not lean or bend your knees. Repeat __________ times. Complete this exercise __________ times a day. Single leg stand This exercise increases  your balance. Without shoes, stand near a railing or in a doorway. You may hold on to the railing or door frame as needed for balance. Squeeze your left / right buttock muscles, then lift up your other foot. Do not let your left / right hip push out to the side. It is helpful to stand in front of a mirror for this exercise so you can watch your hip. Hold this position for __________ seconds. Repeat __________ times. Complete this exercise __________ times a day.   Iliotibial Band Syndrome Rehab Ask your health care provider which exercises are safe for you. Do exercises exactly as told by your provider and adjust them as told. It's normal to feel mild stretching, pulling, tightness, or discomfort as you do these exercises. Stop right away if you feel sudden pain or your pain gets a lot worse. Do not begin these exercises until told by your provider. Stretching and range-of-motion exercises These exercises warm up your muscles and joints. They also improve the movement and flexibility of your hip and pelvis. Quadriceps stretch, prone  Lie face down (prone) on a firm surface like a bed or padded floor. Bend your left / right knee. Reach back to hold your ankle or pant leg. If you can't reach your ankle or pant leg, use a belt looped around your foot and grab the belt instead. Gently pull your heel toward your butt. Your knee should not slide out to the side. You should feel a stretch in the front of your thigh and knee, also called the quadriceps. Hold this position for __________ seconds. Repeat __________ times. Complete this exercise __________ times a day. Iliotibial band stretch The iliotibial band is a strip of tissue that runs along the outside of your hip down to your knee. Lie on your side with your left / right leg on top. Bend both knees and grab your left / right ankle. Stretch out your bottom arm to help you balance. Slowly bring your top knee back so your thigh goes behind your  back. Slowly lower your top leg toward the floor until you feel a gentle stretch on the outside of your left / right hip and thigh. If you don't feel a stretch and your knee won't go farther, place the heel of your other foot on top of your knee and pull your knee down toward the floor with your foot. Hold this position for __________ seconds. Repeat __________ times. Complete this exercise __________ times a day. Strengthening exercises These exercises build strength and endurance in your hip and pelvis. Endurance means your muscles can keep working even when they're tired. Straight leg raises, side-lying This exercise strengthens the muscles that rotate the leg at the hip and move it away from your body. These muscles are called hip abductors. Lie on your side with your left / right leg on top. Lie so your head, shoulder, hip, and knee line up. You can bend your bottom knee to help you balance.  Roll your hips slightly forward so they're stacked directly over each other. Your left / right knee should face forward. Tense the muscles in your outer thigh and hip. Lift your top leg 4-6 inches (10-15 cm) off the ground. Hold this position for __________ seconds. Slowly lower your leg back down to the starting position. Let your muscles fully relax before doing this exercise again. Repeat __________ times. Complete this exercise __________ times a day. Leg raises, prone This exercise strengthens the muscles that move the hips backward. These muscles are called hip extensors. Lie face down (prone) on your bed or a firm surface. You can put a pillow under your hips for comfort and to support your lower back. Bend your left / right knee so your foot points straight up toward the ceiling. Keep the other leg straight and behind you. Squeeze your butt muscles. Lift your left / right thigh off the firm surface. Do not let your back arch. Tense your thigh muscle as hard as you can without having more knee  pain. Hold this position for __________ seconds. Slowly lower your leg to the starting position. Allow your leg to relax all the way. Repeat __________ times. Complete this exercise __________ times a day. Hip hike  Stand sideways on a bottom step. Place your feet so that your left / right leg is on the step, and the other foot is hanging off the side. If you need support for balance, hold onto a railing or wall. Keep your knees straight and your abdomen square, meaning your hips are level. Then, lift your left / right hip up toward the ceiling. Slowly let your leg that's hanging off the step lower towards the floor. Your foot should get closer to the ground. Do not lean or bend your knees during this movement. Repeat __________ times. Complete this exercise __________ times a day. This information is not intended to replace advice given to you by your health care provider. Make sure you discuss any questions you have with your health care provider. Document Revised: 05/25/2022 Document Reviewed: 05/25/2022 Elsevier Patient Education  2024 [employer].

## 2023-01-11 LAB — CK: Total CK: 30 U/L (ref 29–143)

## 2023-01-11 LAB — VITAMIN D 25 HYDROXY (VIT D DEFICIENCY, FRACTURES): Vit D, 25-Hydroxy: 70 ng/mL (ref 30–100)

## 2023-01-11 LAB — MAGNESIUM: Magnesium: 2 mg/dL (ref 1.5–2.5)

## 2023-01-11 LAB — C-REACTIVE PROTEIN: CRP: 9.6 mg/L — ABNORMAL HIGH (ref <8.0)

## 2023-01-11 LAB — TSH: TSH: 0.18 m[IU]/L — ABNORMAL LOW

## 2023-01-11 LAB — SEDIMENTATION RATE: Sed Rate: 14 mm/h (ref 0–20)

## 2023-01-14 NOTE — Progress Notes (Signed)
TSH remains low-please forward to her PCP.  CK WNL Vitamin D WNL ESR WNL Magnesium WNL  CRP is elevated--if patient develops any joint swelling we can schedule an ultrasound of her hands or feet to assess for synovitis.

## 2023-01-15 ENCOUNTER — Telehealth: Payer: Self-pay | Admitting: Family Medicine

## 2023-01-15 NOTE — Telephone Encounter (Signed)
We received notification from patient's Rheumatology that patient had low TSH result on recent labs 01/10/23, which means she may have Hyperthyroid concern.  Last seen by me in June 2024 for acute visit only.  Please notify patient that when she is ready to follow-up for a routine visit or follow-up with me we can discuss the thyroid result and consider options for her.  I would see her approximately 4-6 weeks from the date of her last lab draw 10/17. So anytime in mid November or December is reasonable to follow-up.  Okay to schedule   Saralyn Pilar, DO [facility] 01/15/2023, 1:51 PM

## 2023-01-16 NOTE — Telephone Encounter (Signed)
Left message for patient to return call to clinic.  PEC you can relay message and schedule appointment if patient would like to.

## 2023-01-17 ENCOUNTER — Ambulatory Visit (INDEPENDENT_AMBULATORY_CARE_PROVIDER_SITE_OTHER): Payer: Commercial Managed Care - PPO | Admitting: Adult Health

## 2023-01-17 ENCOUNTER — Encounter (INDEPENDENT_AMBULATORY_CARE_PROVIDER_SITE_OTHER): Payer: Self-pay | Admitting: Adult Health

## 2023-01-17 VITALS — BP 149/85 | HR 69 | Temp 97.9°F | Ht 69.0 in | Wt 278.0 lb

## 2023-01-17 DIAGNOSIS — E782 Mixed hyperlipidemia: Secondary | ICD-10-CM

## 2023-01-17 DIAGNOSIS — F99 Mental disorder, not otherwise specified: Secondary | ICD-10-CM | POA: Diagnosis not present

## 2023-01-17 DIAGNOSIS — E559 Vitamin D deficiency, unspecified: Secondary | ICD-10-CM

## 2023-01-17 DIAGNOSIS — F5105 Insomnia due to other mental disorder: Secondary | ICD-10-CM | POA: Diagnosis not present

## 2023-01-17 DIAGNOSIS — F329 Major depressive disorder, single episode, unspecified: Secondary | ICD-10-CM

## 2023-01-17 DIAGNOSIS — Z6841 Body Mass Index (BMI) 40.0 and over, adult: Secondary | ICD-10-CM

## 2023-01-17 MED ORDER — DULOXETINE HCL 40 MG PO CPEP
40.0000 mg | ORAL_CAPSULE | Freq: Every day | ORAL | 0 refills | Status: DC
Start: 2023-01-17 — End: 2023-02-14

## 2023-01-17 MED ORDER — TRAZODONE HCL 50 MG PO TABS: 25.0000 mg | ORAL_TABLET | Freq: Every evening | ORAL | 0 refills | Status: DC | PRN

## 2023-01-17 MED ORDER — VITAMIN D (ERGOCALCIFEROL) 1.25 MG (50000 UNIT) PO CAPS
ORAL_CAPSULE | ORAL | 0 refills | Status: DC
Start: 2023-01-17 — End: 2023-02-14

## 2023-01-17 NOTE — Progress Notes (Signed)
WEIGHT SUMMARY AND BIOMETRICS  Vitals Temp: 97.9 F (36.6 C) BP: (!) 149/85 Pulse Rate: 69 SpO2: 97 %   Anthropometric Measurements Height: 5\' 9"  (1.753 m) Weight: 278 lb (126.1 kg) BMI (Calculated): 41.03 Weight at Last Visit: 276lb Weight Lost Since Last Visit: 0 Weight Gained Since Last Visit: 2lb Starting Weight: 277lb Total Weight Loss (lbs): 0 lb (0 kg)   Body Composition  Body Fat %: 47.8 % Fat Mass (lbs): 133 lbs Muscle Mass (lbs): 137.8 lbs Total Body Water (lbs): 90.8 lbs Visceral Fat Rating : 14   Other Clinical Data Fasting: no Labs: no Today's Visit #: 27 Starting Date: 08/13/19    Chief Complaint:   OBESITY Caitlin Barrett is here to discuss her progress with her obesity treatment plan. She is on the the Category 2 Plan and states she is following her eating plan approximately 80 % of the time. She states she is not currently exercising.   Interim History:  "[name]"- 52 year old rescue beagle had to be put to sleep. 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 She reports worsening depression and disrupted sleep since her passing. She denies SI/HI  Sleep- Last 2-3  weeks- she reports difficulty falling asleep, often takes >30 mins. She will then wake 2-3 times per night, once to use restroom. She reports daytime fatigue  Exercise-none   Subjective:   Mixed HLD Discussed Labs Lipid Panel     Component Value Date/Time   CHOL 212 (H) 11/29/2022 0948   TRIG 149 11/29/2022 0948   HDL 42 11/29/2022 0948   CHOLHDL 5.0 (H) 11/29/2022 0948   CHOLHDL 4.8 10/23/2017 0830   VLDL 19 10/01/2016 0835   LDLCALC 143 (H) 11/29/2022 0948   LDLCALC 128 (H) 10/23/2017 0830   LABVLDL 27 11/29/2022 0948   The 10-year ASCVD risk score (Arnett DK, et al., 2019) is: 2.5%   Values used to calculate the score:     Age: 52 years     Sex: Female     Is Non-Hispanic African American: No     Diabetic: No     Tobacco smoker: No     Systolic Blood Pressure: 149  mmHg     Is BP treated: No     HDL Cholesterol: 42 mg/dL     Total Cholesterol: 212 mg/dL   2. Vitamin D deficiency Discussed Labs  Latest Reference Range & Units 11/29/22 09:48 01/10/23 15:49  Vitamin D, 25-Hydroxy 30 - 100 ng/mL 65.4 70   Level therapueitc She is on weekly Ergocalciferol-denies N/V/Muscle Weakness  3. Insomnia due to other mental disorder Discussed Labs 9/5//2024 CMP- Electrolytes, Kidney/Liver enzymes- WNL Last 2-3  weeks- she reports difficulty falling asleep, often takes >30 mins. She will then wake 2-3 times per night, once to use restroom. She reports daytime fatigue  PDMP Reviewed- no aberrances notes  Discussed short course PRN Trazodone- to assist with anxiety and poor sleep r/t to recent passing of "[name]"   4. Major depressive disorder, remission status unspecified, unspecified whether recurrent She denies SI/HI She reports increased sadness and worsening sleep since the passing of "[name]"- she was 52 years old- RIP She reports diffuse pain currently rated 4/10. Previous rating 8/10 She is currently on daily Cymbalta 40mg  daily   Assessment/Plan:   1.Mixed HLD  2. Vitamin D deficiency Refill - Vitamin D, Ergocalciferol, (DRISDOL) 1.25 MG (50000 UNIT) CAPS capsule; TAKE ONE CAPSULE BY MOUTH EVERY 14 DAYS  Dispense: 8 capsule; Refill:  0  3. Insomnia due to other mental disorder Start traZODone (DESYREL) 50 MG tablet Take 0.5-1 tablets (25-50 mg total) by mouth at bedtime as needed for sleep. Dispense: 30 tablet, Refills: 0 ordered   Do not take with flexeril or ETOH- pt verbalized understanding/agreement  4. Major depressive disorder, remission status unspecified, unspecified whether recurrent Refill DULoxetine HCl 40 MG CPEP Take 1 capsule (40 mg total) by mouth daily. Dispense: 90 capsule, Refills: 0 ordered   5. Morbid obesity (HCC), Current BMI 41.03  [name] is currently in the action stage of change. As such, her goal is to continue  with weight loss efforts. She has agreed to the Category 2 Plan.   Exercise goals: All adults should avoid inactivity. Some physical activity is better than none, and adults who participate in any amount of physical activity gain some health benefits. Adults should also include muscle-strengthening activities that involve all major muscle groups on 2 or more days a week.  Behavioral modification strategies: increasing lean protein intake, decreasing simple carbohydrates, increasing vegetables, increasing water intake, no skipping meals, meal planning and cooking strategies, keeping healthy foods in the home, and planning for success.  [name] has agreed to follow-up with our clinic in 4 weeks. She was informed of the importance of frequent follow-up visits to maximize her success with intensive lifestyle modifications for her multiple health conditions.   Objective:   Blood pressure (!) 149/85, pulse 69, temperature 97.9 F (36.6 C), height 5\' 9"  (1.753 m), weight 278 lb (126.1 kg), last menstrual period 05/26/2016, SpO2 97%. Body mass index is 41.05 kg/m.  General: Cooperative, alert, well developed, in no acute distress. HEENT: Conjunctivae and lids unremarkable. Cardiovascular: Regular rhythm.  Lungs: Normal work of breathing. Neurologic: No focal deficits.   Lab Results  Component Value Date   CREATININE 0.78 11/29/2022   BUN 15 11/29/2022   NA 137 11/29/2022   K 4.5 11/29/2022   CL 102 11/29/2022   CO2 21 11/29/2022   Lab Results  Component Value Date   ALT 9 11/29/2022   AST 11 11/29/2022   ALKPHOS 56 11/29/2022   BILITOT 0.2 11/29/2022   Lab Results  Component Value Date   HGBA1C 5.4 11/29/2022   HGBA1C 5.2 08/28/2021   HGBA1C 5.3 05/03/2021   HGBA1C 5.3 12/29/2020   HGBA1C 5.3 03/31/2020   Lab Results  Component Value Date   INSULIN 17.1 11/29/2022   INSULIN 10.7 08/28/2021   INSULIN 17.5 05/03/2021   INSULIN 10.1 12/29/2020   INSULIN 8.9 06/09/2020   Lab  Results  Component Value Date   TSH 0.18 (L) 01/10/2023   Lab Results  Component Value Date   CHOL 212 (H) 11/29/2022   HDL 42 11/29/2022   LDLCALC 143 (H) 11/29/2022   TRIG 149 11/29/2022   CHOLHDL 5.0 (H) 11/29/2022   Lab Results  Component Value Date   VD25OH 70 01/10/2023   VD25OH 65.4 11/29/2022   VD25OH 45.2 05/02/2022   Lab Results  Component Value Date   WBC 8.0 09/11/2022   HGB 11.6 (L) 09/11/2022   HCT 34.8 (L) 09/11/2022   MCV 89.9 09/11/2022   PLT 332 09/11/2022   Lab Results  Component Value Date   IRON 64 08/13/2019   TIBC 333 08/13/2019   FERRITIN 30 08/13/2019   Attestation Statements:   Reviewed by clinician on day of visit: allergies, medications, problem list, medical history, surgical history, family history, social history, and previous encounter notes.  I have reviewed the  above documentation for accuracy and completeness, and I agree with the above. -  Brynda Heick d. Denni France, NP-C

## 2023-02-08 ENCOUNTER — Other Ambulatory Visit (INDEPENDENT_AMBULATORY_CARE_PROVIDER_SITE_OTHER): Payer: Self-pay | Admitting: Adult Health

## 2023-02-14 ENCOUNTER — Emergency Department: Payer: Commercial Managed Care - PPO

## 2023-02-14 ENCOUNTER — Ambulatory Visit (INDEPENDENT_AMBULATORY_CARE_PROVIDER_SITE_OTHER): Payer: Commercial Managed Care - PPO | Admitting: Adult Health

## 2023-02-14 ENCOUNTER — Encounter: Payer: Self-pay | Admitting: Emergency Medicine

## 2023-02-14 ENCOUNTER — Other Ambulatory Visit: Payer: Self-pay

## 2023-02-14 ENCOUNTER — Ambulatory Visit
Admission: EM | Admit: 2023-02-14 | Discharge: 2023-02-14 | Disposition: A | Discharge status: Home / Self Care | Payer: Commercial Managed Care - PPO | Attending: Emergency Medicine | Admitting: Emergency Medicine

## 2023-02-14 ENCOUNTER — Telehealth (INDEPENDENT_AMBULATORY_CARE_PROVIDER_SITE_OTHER): Payer: Self-pay | Admitting: Adult Health

## 2023-02-14 ENCOUNTER — Emergency Department
Admission: EM | Admit: 2023-02-14 | Discharge: 2023-02-14 | Disposition: A | Discharge status: Home / Self Care | Payer: Commercial Managed Care - PPO | Attending: Emergency Medicine | Admitting: Emergency Medicine

## 2023-02-14 ENCOUNTER — Encounter (INDEPENDENT_AMBULATORY_CARE_PROVIDER_SITE_OTHER): Payer: Self-pay | Admitting: Adult Health

## 2023-02-14 VITALS — BP 141/87 | HR 88 | Temp 97.6°F | Ht 69.0 in

## 2023-02-14 DIAGNOSIS — R030 Elevated blood-pressure reading, without diagnosis of hypertension: Secondary | ICD-10-CM | POA: Diagnosis not present

## 2023-02-14 DIAGNOSIS — F99 Mental disorder, not otherwise specified: Secondary | ICD-10-CM

## 2023-02-14 DIAGNOSIS — R079 Chest pain, unspecified: Secondary | ICD-10-CM

## 2023-02-14 DIAGNOSIS — I10 Essential (primary) hypertension: Secondary | ICD-10-CM | POA: Diagnosis not present

## 2023-02-14 DIAGNOSIS — F329 Major depressive disorder, single episode, unspecified: Secondary | ICD-10-CM

## 2023-02-14 DIAGNOSIS — Z6841 Body Mass Index (BMI) 40.0 and over, adult: Secondary | ICD-10-CM

## 2023-02-14 DIAGNOSIS — F5105 Insomnia due to other mental disorder: Secondary | ICD-10-CM | POA: Diagnosis not present

## 2023-02-14 DIAGNOSIS — I160 Hypertensive urgency: Secondary | ICD-10-CM | POA: Diagnosis not present

## 2023-02-14 DIAGNOSIS — J45909 Unspecified asthma, uncomplicated: Secondary | ICD-10-CM | POA: Diagnosis not present

## 2023-02-14 DIAGNOSIS — E559 Vitamin D deficiency, unspecified: Secondary | ICD-10-CM | POA: Diagnosis not present

## 2023-02-14 DIAGNOSIS — E039 Hypothyroidism, unspecified: Secondary | ICD-10-CM | POA: Diagnosis not present

## 2023-02-14 LAB — CBC
HCT: 39.6 % (ref 36.0–46.0)
Hemoglobin: 13.3 g/dL (ref 12.0–15.0)
MCH: 30.4 pg (ref 26.0–34.0)
MCHC: 33.6 g/dL (ref 30.0–36.0)
MCV: 90.6 fL (ref 80.0–100.0)
Platelets: 401 10*3/uL — ABNORMAL HIGH (ref 150–400)
RBC: 4.37 MIL/uL (ref 3.87–5.11)
RDW: 12.8 % (ref 11.5–15.5)
WBC: 10.5 10*3/uL (ref 4.0–10.5)
nRBC: 0 % (ref 0.0–0.2)

## 2023-02-14 LAB — BASIC METABOLIC PANEL
Anion gap: 10 (ref 5–15)
BUN: 19 mg/dL (ref 6–20)
CO2: 26 mmol/L (ref 22–32)
Calcium: 9 mg/dL (ref 8.9–10.3)
Chloride: 100 mmol/L (ref 98–111)
Creatinine, Ser: 0.77 mg/dL (ref 0.44–1.00)
GFR, Estimated: >60 mL/min (ref >60)
Glucose, Bld: 119 mg/dL — ABNORMAL HIGH (ref 70–99)
Potassium: 3.8 mmol/L (ref 3.5–5.1)
Sodium: 136 mmol/L (ref 135–145)

## 2023-02-14 LAB — TROPONIN I (HIGH SENSITIVITY)
Troponin I (High Sensitivity): 5 ng/L (ref <18)
Troponin I (High Sensitivity): 5 ng/L (ref <18)

## 2023-02-14 MED ORDER — TRAZODONE HCL 50 MG PO TABS: 25.0000 mg | ORAL_TABLET | Freq: Every evening | ORAL | 0 refills | Status: DC | PRN

## 2023-02-14 MED ORDER — AMLODIPINE BESYLATE 5 MG PO TABS: 5.0000 mg | ORAL_TABLET | Freq: Every day | ORAL | 3 refills | Status: DC

## 2023-02-14 MED ORDER — DULOXETINE HCL 40 MG PO CPEP
40.0000 mg | ORAL_CAPSULE | Freq: Every day | ORAL | 0 refills | Status: DC
Start: 2023-02-14 — End: 2023-03-21

## 2023-02-14 MED ORDER — VITAMIN D (ERGOCALCIFEROL) 1.25 MG (50000 UNIT) PO CAPS
ORAL_CAPSULE | ORAL | 0 refills | Status: DC
Start: 2023-02-14 — End: 2023-03-21

## 2023-02-14 NOTE — ED Provider Notes (Signed)
[facility]    CSN: [id number] Arrival date & time: 02/14/23  1610      History   Chief Complaint Chief Complaint  Patient presents with   Hypertension   Chest Pain    HPI Caitlin Barrett is a 52 y.o. female.   HPI  52 year old female with a past medical history significant for anxiety, hypothyroidism, IBS, eczema, anemia, asthma, and GERD presents for evaluation of chest pressure that has been present for the last 11 days that she describes as if someone was putting steady pressure in the middle of her chest.  This does not radiate anywhere and is not associate with any sweating or nausea.  She has shortness of breath at baseline with activity since having COVID and has not noticed any increase in her shortness of breath.  She went to her weight loss clinic today and they noted that her blood pressure was elevated so they advised her to follow-up if her symptoms continued.  Past Medical History:  Diagnosis Date   Allergy    seasonal.  Takes allergy shots at [facility]   Anemia    Anxiety    Arthritis    Asthma    exercise induced--no inhalers   Back pain    Cancer (HCC) 2010   Basal cell skin cancer Lt eye, skin graft above Lt eye   Constipation    Costochondritis    Depression    Dysmenorrhea    Eczema    Edema, lower extremity    GERD (gastroesophageal reflux disease)    Headache    Hypothyroid    Nodules and Fine needle aspiration   IBS (irritable bowel syndrome)    Lab test positive for detection of COVID-19 virus    November 2020   Myalgia    Osteoarthritis    Painful menstrual periods    Palpitations    Plantar fasciitis    Shortness of breath    Sprain of carpal (joint) of wrist    Vitamin B 12 deficiency    Vitamin D deficiency     Patient Active Problem List   Diagnosis Date Noted   BMI 39.0-39.9,adult-current bmi 39.4 06/20/2022   Morbid obesity (HCC)-start bmi 40.91 06/20/2022   Depression 08/11/2021   Bilateral lower  extremity edema 08/06/2021   Status post nasal septoplasty 03/30/2021   Colon cancer screening    Polyp of colon    Depressed mood 12/01/2020   Nausea without vomiting 11/07/2020   Upper respiratory tract infection 11/07/2020   Thyroid disease 06/09/2020   Cervical strain 04/25/2020   Fatigue 04/04/2020   GAD (generalized anxiety disorder) 02/08/2020   Pericardial pain 01/28/2020   Muscle soreness 01/28/2020   Mixed hyperlipidemia 11/25/2019   Elevated BP without diagnosis of hypertension 11/25/2019   Polyphagia 10/08/2019   Vitamin D deficiency 09/17/2019   Insulin resistance 09/17/2019   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   Class 3 severe obesity with serious comorbidity and body mass index (BMI) of 40.0 to 44.9 in adult [facility]) 09/17/2019   Exercise-induced asthma 04/22/2018   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   Monilial vaginitis 02/26/2017   Decreased libido 02/26/2017   Major depressive disorder, recurrent, in full remission (HCC) 02/26/2017   Dyspareunia, female 02/26/2017   S/P TAH (total abdominal hysterectomy) 07/02/2016   Nevus of abdominal wall 06/11/2016   Morton's neuralgia 07/21/2015   Nocturia 07/21/2015   Stress incontinence 07/21/2015   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   Allergic rhinitis 06/21/2015   Obesity (BMI  30-39.9) 06/21/2015    Past Surgical History:  Procedure Laterality Date   ABDOMINAL HYSTERECTOMY Bilateral 07/02/2016   Procedure: HYSTERECTOMY ABDOMINAL WITH BILATERAL SALPINGECTOMY-MIDLINE INCISION;  Surgeon: Herold Harms, MD;  Location: [facility] ORS;  Service: Gynecology;  Laterality: Bilateral;   CHOLECYSTECTOMY  2009   COLONOSCOPY WITH PROPOFOL N/A 01/26/2021   Procedure: COLONOSCOPY WITH PROPOFOL;  Surgeon: Toney Reil, MD;  Location: [facility] END[facility];  Service: Gastroenterology;  Laterality: N/A;   fibromalga     HERNIA REPAIR  1974   umbilical   NASAL SEPTOPLASTY W/ TURBINOPLASTY Bilateral 03/03/2021   Procedure: NASAL  SEPTOPLASTY WITH SUBMUCOSAL RESECTION OF TURBINATE;  Surgeon: Linus Salmons, MD;  Location: [facility];  Service: ENT;  Laterality: Bilateral;   SKIN GRAFT  2010   TONSILLECTOMY      OB History     Gravida  0   Para  0   Term  0   Preterm  0   AB  0   Living  0      SAB  0   IAB  0   Ectopic  0   Multiple  0   Live Births  0            Home Medications    Prior to Admission medications   Medication Sig Start Date End Date Taking? Authorizing Provider  albuterol (VENTOLIN HFA) 108 (90 Base) MCG/ACT inhaler TAKE 2 PUFFS BY MOUTH EVERY 6 HOURS AS NEEDED FOR WHEEZE OR SHORTNESS OF BREATH 10/01/22   Brimage, Vondra, DO  clobetasol cream (TEMOVATE) 0.05 % Apply 1 application topically 2 (two) times daily. For up to 1-2 weeks then as needed. Do not use on sensitive skin. 04/24/19   Karamalegos, Netta Neat, DO  COLLAGEN-BORON-HYALURONIC ACID PO Take by mouth.    [provider]  cyclobenzaprine (FLEXERIL) 10 MG tablet Take 1 tablet (10 mg total) by mouth at bedtime as needed for muscle spasms. 08/25/22   Katha Cabal, DO  DULoxetine HCl 40 MG CPEP Take 1 capsule (40 mg total) by mouth daily. 02/14/23   Danford, Orpha Bur D, NP  EPINEPHrine 0.3 mg/0.3 mL IJ SOAJ injection 0.3 mg as needed. 09/25/08   [provider]  estradiol (ESTRACE) 1 MG tablet Take 1 tablet (1 mg total) by mouth daily. 08/07/22   Hildred Laser, MD  fluticasone (FLONASE) 50 MCG/ACT nasal spray Place into both nostrils as needed. 05/17/21   [provider]  loratadine (CLARITIN) 10 MG tablet Take 10 mg by mouth daily as needed. 10/13/20   [provider]  Misc Natural Products (OSTEO BI-FLEX ADV TRIPLE ST PO) Take by mouth.    [provider]  Multiple Vitamins-Minerals (MULTIVITAMIN WOMEN PO) Take by mouth.    [provider]  omeprazole (PRILOSEC) 40 MG capsule Take 40 mg by mouth as needed.    [provider]  Probiotic Product  (TRUBIOTICS) CAPS Take 1 capsule by mouth daily.    [provider]  progesterone (PROMETRIUM) 200 MG capsule TAKE 1 CAPSULE BY MOUTH EVERY DAY 08/07/22   Hildred Laser, MD  rizatriptan (MAXALT-MLT) 10 MG disintegrating tablet Take 1 tablet (10 mg total) by mouth as needed for migraine. May repeat in 2 hours if needed. Max dose is 2 tabs in 24 hours. 11/28/22   Karamalegos, Netta Neat, DO  traZODone (DESYREL) 50 MG tablet Take 0.5-1 tablets (25-50 mg total) by mouth at bedtime as needed for sleep. 02/14/23   Danford, Jinny Blossom, NP  triamcinolone  cream (KENALOG) 0.1 % APPLY THIN FILM TWICE DAILY TO ECZEMA UNTIL CLEAR 12/23/18   [provider]  TURMERIC PO Take by mouth daily.    [provider]  VITAMIN D PO Take 1,000 Units by mouth. 6 days weekly    [provider]  Vitamin D, Ergocalciferol, (DRISDOL) 1.25 MG (50000 UNIT) CAPS capsule TAKE ONE CAPSULE BY MOUTH EVERY 14 DAYS 02/14/23   Danford, Jinny Blossom, NP    Family History Family History  Problem Relation Age of Onset   Diabetes Mother    Hyperlipidemia Mother    Hypertension Mother    Sleep apnea Mother    Obesity Mother    Cancer Father        lung CA   Heart disease Maternal Grandmother    Stroke Maternal Grandmother    Hypertension Maternal Grandmother    Diabetes Maternal Grandmother    Heart disease Maternal Grandfather    Diabetes Maternal Grandfather    Heart attack Maternal Grandfather    Breast cancer Neg Hx     Social History Social History   Tobacco Use   Smoking status: Never    Passive exposure: Past   Smokeless tobacco: Never  Vaping Use   Vaping status: Never Used  Substance Use Topics   Alcohol use: Yes    Comment: rare   Drug use: No     Allergies   Iodinated contrast media and Codeine   Review of Systems Review of Systems  Constitutional:  Negative for diaphoresis.  Respiratory:  Positive for shortness of breath. Negative for cough and chest tightness.    Cardiovascular:  Positive for chest pain. Negative for palpitations.  Gastrointestinal:  Negative for nausea.     Physical Exam Triage Vital Signs ED Triage Vitals [02/14/23 1618]  Encounter Vitals Group     BP      Systolic BP Percentile      Diastolic BP Percentile      Pulse      Resp      Temp      Temp src      SpO2      Weight      Height      Head Circumference      Peak Flow      Pain Score 4     Pain Loc      Pain Education      Exclude from Growth Chart    No data found.  Updated Vital Signs BP (!) 154/119 (BP Location: Left Wrist)   Pulse 83   Temp 98.1 F (36.7 C) (Oral)   Resp 18   LMP 05/26/2016 (Exact Date)   SpO2 100%   Visual Acuity Right Eye Distance:   Left Eye Distance:   Bilateral Distance:    Right Eye Near:   Left Eye Near:    Bilateral Near:     Physical Exam Vitals and nursing note reviewed.  Constitutional:      Appearance: Normal appearance. She is not ill-appearing.  HENT:     Head: Normocephalic and atraumatic.  Cardiovascular:     Rate and Rhythm: Normal rate and regular rhythm.     Pulses: Normal pulses.     Heart sounds: Normal heart sounds. No murmur heard.    No friction rub. No gallop.  Pulmonary:     Effort: Pulmonary effort is normal.     Breath sounds: Normal breath sounds. No wheezing, rhonchi or rales.  Skin:  General: Skin is warm and dry.     Capillary Refill: Capillary refill takes less than 2 seconds.     Findings: No rash.  Neurological:     General: No focal deficit present.     Mental Status: She is alert and oriented to person, place, and time.      UC Treatments / Results  Labs (all labs ordered are listed, but only abnormal results are displayed) Labs Reviewed - No data to display  EKG Normal sinus rhythm with a ventricular rate of 74 bpm Pure interval 168 ms QRS duration 90 ms QT/QTc 366/406 ms There are inverted T waves in V1 and V2.  Radiology No results  found.  Procedures Procedures (including critical care time)  Medications Ordered in UC Medications - No data to display  Initial Impression / Assessment and Plan / UC Course  I have reviewed the triage vital signs and the nursing notes.  Pertinent labs & imaging results that were available during my care of the patient were reviewed by me and considered in my medical decision making (see chart for details).   Patient is a nontoxic-appearing 52 year old female presenting for evaluation of 11 days worth of substernal chest pressure that does not radiate and is not associated with sweating or nausea.  She does have shortness of breath at baseline since having COVID but has not noticed any increase.  She came in tonight because she was at her weight loss appointment today and then does that her blood pressure is elevated.  Her blood pressure continues to be elevated at 154/119.  EKG was collected at triage which shows normal sinus rhythm but patient has flipped T's in V1 and V2 when compared to EKG dated 07/21/2021.  Given the patient is experiencing EKG changes as well as hypertensive urgency I do feel that she should be evaluated in the emergency department.  She has elected to travel via POV to [facility].  I do feel that she is safe to travel by POV as this is an ongoing issue.   Final Clinical Impressions(s) / UC Diagnoses   Final diagnoses:  Hypertensive urgency  Chest pain, unspecified type     Discharge Instructions      Please go to the emergency department at [facility] to be evaluated for your ongoing chest pressure and your EKG changes.  Please go now.     ED Prescriptions   None    PDMP not reviewed this encounter.   Becky Augusta, NP 02/14/23 1645

## 2023-02-14 NOTE — ED Notes (Signed)
Patient is being discharged from the Urgent Care and sent to the Emergency Department via Personal Vehicle . Per Tomma Rakers, NP, patient is in need of higher level of care due to Hypertensive Urgency. Patient is aware and verbalizes understanding of plan of care.  Vitals:   02/14/23 1619  BP: (!) 154/119  Pulse: 83  Resp: 18  Temp: 98.1 F (36.7 C)  SpO2: 100%

## 2023-02-14 NOTE — Telephone Encounter (Signed)
New message   The patient wanted to ensure that PA was aware she sought care at Urgent Care, and they advised her to go to the emergency room for further evaluation

## 2023-02-14 NOTE — ED Notes (Signed)
See triage note  Presents with some chest soreness /tightness for about 1 week or more  States she went to the Dr today and noticed that her b/p was elevated  no hx of same

## 2023-02-14 NOTE — Discharge Instructions (Addendum)
Your exam, labs, EKG, and chest x-ray are normal and reassuring at this time.  No signs of a serious cardiac source for your symptoms.  You do have evidence of stage I hypertension, which can be managed with a calcium channel blocker like amlodipine.  Take the prescription medicine daily as directed.  Continue to monitor and document your blood pressure readings.  Follow-up with primary provider or cardiologist as discussed.  Return to ED if necessary.

## 2023-02-14 NOTE — ED Triage Notes (Signed)
Pt was at the weight loss clinic today and was  advised that her BP was elevated. Pt denies history of hypertension. She has had chest pain/ pressure for the past few weeks.

## 2023-02-14 NOTE — ED Triage Notes (Signed)
Patient to ED via POV from UC for chest pain and hypertension. PT reports CP since 11/10. Reports centralized and pressure like feeling. Denies hx of hypertension.

## 2023-02-14 NOTE — Progress Notes (Signed)
WEIGHT SUMMARY AND BIOMETRICS  Vitals Temp: 97.6 F (36.4 C) BP: (!) 141/87 Pulse Rate: 88 SpO2: 97 %   Anthropometric Measurements Height: 5\' 9"  (1.753 m) Weight:  (0 kg) (Pt refused to weigh, said she usually weighs in the morning.) Weight at Last Visit: 278lb Starting Weight: 277lb   No data recorded Other Clinical Data Fasting: no Labs: no Today's Visit #: 48 Starting Date: 08/13/19    Chief Complaint:   OBESITY Caitlin Barrett is here to discuss her progress with her obesity treatment plan. She is on the the Category 2 Plan and states she is following her eating plan approximately 80 % of the time.  She states she is exercising NEAT Activities and Dog Walking daily.   Interim History:  Caitlin Barrett presents today with complaint of sternal chest pressure and worsening GERD sx's for last 2 weeks. She also reports dyspnea with exertion- that has been present since her 4th COVID-19 Infection July 2024. Her BP is elevated at OV She denies frank chest pain. She denies jaw pain. She denies pain between scapulas She denies GI upset, has had some intermittent HAs last two weeks.  Subjective:   1. Elevated blood pressure reading Both BP readings are above goal She endorses constant chest pressure and intermittent "burning sensation"- mid sternal without radiation. Her last several BP readings were also above goal. She has never been on antihypertensive therapy. Her twin sister (fraternal) has been on antihypertensives for > 3 years.  She has been evaluated by Cards on 07/22/2022- Precordial Pain OV Notes  PLAN:     In order of problems listed above:   chest pain, reproducible with palpation suggesting musculoskeletal etiology.  Prior work-up with echo and Lexiscan Myoview were normal.   Hyperlipidemia, ASCVD 1.4%.  Not in statin benefit group, continue low-cholesterol diet. Obesity, weight loss advised.  Plans to follow-up with PCP regarding starting  Ozempic/Wegovy.   Follow-up as needed  2. Vitamin D deficiency  Latest Reference Range & Units 01/10/23 15:49  Vitamin D, 25-Hydroxy 30 - 100 ng/mL 70   She is on bi-weekly Ergocalciferol- tolerating well  3. Insomnia due to other mental disorder PDMP reviewed- no aberrancies noted. She endorses sound, restful sleep with PRN Trazodone 50mg  1/2 to 1 tab at bedtime  She denies morning sedation or medication "hangover"  4. Major depressive disorder, remission status unspecified, unspecified whether recurrent She reports stable mood, denies SI/HI She is on daily Duloxetine 40mg  for mood stabilization and tx of diffuse pain.  Assessment/Plan:   1. Elevated blood pressure reading Discussed Red Flag sx's at length- if any develop seek immediate medical assistance. Recommend that she be evaluated by local UC for prolonged chest pressure + elevated BP She verbalized understanding and agreement She is stable, able to drive  2. Vitamin D deficiency Refil - Vitamin D, Ergocalciferol, (DRISDOL) 1.25 MG (50000 UNIT) CAPS capsule; TAKE ONE CAPSULE BY MOUTH EVERY 14 DAYS  Dispense: 8 capsule; Refill: 0  3. Insomnia due to other mental disorder Refill traZODone (DESYREL) 50 MG tablet Take 0.5-1 tablets (25-50 mg total) by mouth at bedtime as needed for sleep. Dispense: 30 tablet, Refills: 0   4. Major depressive disorder, remission status unspecified, unspecified whether recurrent Refill - DULoxetine HCl 40 MG CPEP; Take 1 capsule (40 mg total) by mouth daily.  Dispense: 90 capsule; Refill: 0  5. Morbid obesity (HCC), Current BMI 41.03  Caitlin Barrett is currently in the action stage of change. As such, her goal  is to continue with weight loss efforts. She has agreed to the Category 2 Plan.   Exercise goals: No exercise has been prescribed at this time.  Behavioral modification strategies: increasing lean protein intake, decreasing simple carbohydrates, increasing vegetables, increasing water  intake, decreasing sodium intake, decreasing eating out, and no skipping meals.  Caitlin Barrett has agreed to follow-up with our clinic in 4 weeks. She was informed of the importance of frequent follow-up visits to maximize her success with intensive lifestyle modifications for her multiple health conditions.   Objective:   Blood pressure (!) 141/87, pulse 88, temperature 97.6 F (36.4 C), height 5\' 9"  (1.753 m), last menstrual period 05/26/2016, SpO2 97%. Body mass index is 41.05 kg/m.  General: Cooperative, alert, well developed, in no acute distress. HEENT: Conjunctivae and lids unremarkable. Cardiovascular: Regular rhythm.  Lungs: Normal work of breathing. Neurologic: No focal deficits.   Lab Results  Component Value Date   CREATININE 0.78 11/29/2022   BUN 15 11/29/2022   NA 137 11/29/2022   K 4.5 11/29/2022   CL 102 11/29/2022   CO2 21 11/29/2022   Lab Results  Component Value Date   ALT 9 11/29/2022   AST 11 11/29/2022   ALKPHOS 56 11/29/2022   BILITOT 0.2 11/29/2022   Lab Results  Component Value Date   HGBA1C 5.4 11/29/2022   HGBA1C 5.2 08/28/2021   HGBA1C 5.3 05/03/2021   HGBA1C 5.3 12/29/2020   HGBA1C 5.3 03/31/2020   Lab Results  Component Value Date   INSULIN 17.1 11/29/2022   INSULIN 10.7 08/28/2021   INSULIN 17.5 05/03/2021   INSULIN 10.1 12/29/2020   INSULIN 8.9 06/09/2020   Lab Results  Component Value Date   TSH 0.18 (L) 01/10/2023   Lab Results  Component Value Date   CHOL 212 (H) 11/29/2022   HDL 42 11/29/2022   LDLCALC 143 (H) 11/29/2022   TRIG 149 11/29/2022   CHOLHDL 5.0 (H) 11/29/2022   Lab Results  Component Value Date   VD25OH 70 01/10/2023   VD25OH 65.4 11/29/2022   VD25OH 45.2 05/02/2022   Lab Results  Component Value Date   WBC 8.0 09/11/2022   HGB 11.6 (L) 09/11/2022   HCT 34.8 (L) 09/11/2022   MCV 89.9 09/11/2022   PLT 332 09/11/2022   Lab Results  Component Value Date   IRON 64 08/13/2019   TIBC 333 08/13/2019    FERRITIN 30 08/13/2019    Attestation Statements:   Reviewed by clinician on day of visit: allergies, medications, problem list, medical history, surgical history, family history, social history, and previous encounter notes.  I have reviewed the above documentation for accuracy and completeness, and I agree with the above. -  Constantina Laseter d. Brax Walen, NP-C

## 2023-02-14 NOTE — ED Provider Notes (Signed)
[facility] Emergency Department Provider Note     Event Date/Time   First MD Initiated Contact with Patient 02/14/23 1815     (approximate)   History   Chest Pain   HPI  Caitlin Barrett is a 52 y.o. female with a past medical history significant for anxiety, hypothyroidism, IBS, eczema, anemia, asthma, and GERD presents for evaluation of chest pressure that has been present for the last 11 days that she describes as if someone was putting steady pressure in the middle of her chest. This does not radiate anywhere and is not associate with any sweating or nausea. She has shortness of breath at baseline with activity since having COVID and has not noticed any increase in her shortness of breath. She went to her weight loss clinic today and they noted that her blood pressure was elevated so they advised her to follow-up if her symptoms continued.     Physical Exam   Triage Vital Signs: ED Triage Vitals  Encounter Vitals Group     BP 02/14/23 1708 (!) 148/109     Systolic BP Percentile --      Diastolic BP Percentile --      Pulse Rate 02/14/23 1708 81     Resp 02/14/23 1708 18     Temp 02/14/23 1708 98.2 F (36.8 C)     Temp Source 02/14/23 1708 Oral     SpO2 02/14/23 1708 100 %     Weight 02/14/23 1709 282 lb (127.9 kg)     Height 02/14/23 1709 5\' 8"  (1.727 m)     Head Circumference --      Peak Flow --      Pain Score 02/14/23 1709 4     Pain Loc --      Pain Education --      Exclude from Growth Chart --     Most recent vital signs: Vitals:   02/14/23 1840 02/14/23 2129  BP: 136/85 139/87  Pulse: 78 77  Resp: 18 18  Temp:    SpO2: 100% 100%    General Awake, no distress. NAD HEENT NCAT. PERRL. EOMI. No rhinorrhea. Mucous membranes are moist.  CV:  Good peripheral perfusion. RRR.  No murmurs, rubs, or gallops. RESP:  Normal effort. CTA ABD:  No distention.    ED Results / Procedures / Treatments   Labs (all labs ordered are  listed, but only abnormal results are displayed) Labs Reviewed  BASIC METABOLIC PANEL - Abnormal; Notable for the following components:      Result Value   Glucose, Bld 119 (*)    All other components within normal limits  CBC - Abnormal; Notable for the following components:   Platelets 401 (*)    All other components within normal limits  TROPONIN I (HIGH SENSITIVITY)  TROPONIN I (HIGH SENSITIVITY)     EKG  Vent. rate 77 BPM PR interval 170 ms QRS duration 98 ms QT/QTcB 368/416 ms P-R-T axes 53 -14 73 Normal sinus rhythm Normal ECG No STEMI  RADIOLOGY  I personally viewed and evaluated these images as part of my medical decision making, as well as reviewing the written report by the radiologist.  ED Provider Interpretation: No acute findings  No results found.   PROCEDURES:  Critical Care performed: No  DG CXR 2V  IMPRESSION: No active cardiopulmonary disease.  MEDICATIONS ORDERED IN ED: Medications - No data to display   IMPRESSION / MDM / ASSESSMENT AND PLAN /  ED COURSE  I reviewed the triage vital signs and the nursing notes.                              Differential diagnosis includes, but is not limited to, ACS, aortic dissection, pulmonary embolism, cardiac tamponade, pneumothorax, pneumonia, pericarditis, myocarditis, GI-related causes including esophagitis/gastritis, and musculoskeletal chest wall pain.    Patient's presentation is most consistent with acute complicated illness / injury requiring diagnostic workup.  Patient's diagnosis is consistent with nonspecific chest pain and elevated blood pressure without a diagnosis of hypertension.  Patient with reassuring exam and cardiac workup at this time.  She is found to have a EKG without evidence of N/STEMI or other critical arrhythmia.  Troponin is normal x 2, and remaining labs without evidence of acute abnormality.  Checks x-ray without evidence of any intrathoracic process based on interpretation.   Doubt ACS given patient's protracted course of symptoms.  No evidence of PE, or intrathoracic process.  Patient will be discharged home with prescriptions for amlodipine. Patient is to follow up with PCP as discussed as needed or otherwise directed. Patient is given ED precautions to return to the ED for any worsening or new symptoms.   FINAL CLINICAL IMPRESSION(S) / ED DIAGNOSES   Final diagnoses:  Nonspecific chest pain  Primary hypertension     Rx / DC Orders   ED Discharge Orders          Ordered    amLODipine (NORVASC) 5 MG tablet  Daily        02/14/23 2024             Note:  This document was prepared using Dragon voice recognition software and may include unintentional dictation errors.    Lissa Hoard, PA-C 02/19/23 1937    Janith Lima, MD 02/19/23 [phone]

## 2023-02-14 NOTE — Discharge Instructions (Addendum)
Please go to the emergency department at [facility] to be evaluated for your ongoing chest pressure and your EKG changes.  Please go now.

## 2023-03-09 ENCOUNTER — Other Ambulatory Visit (INDEPENDENT_AMBULATORY_CARE_PROVIDER_SITE_OTHER): Payer: Self-pay | Admitting: Adult Health

## 2023-03-12 ENCOUNTER — Ambulatory Visit: Payer: Commercial Managed Care - PPO | Admitting: Family Medicine

## 2023-03-12 ENCOUNTER — Encounter: Payer: Self-pay | Admitting: Family Medicine

## 2023-03-12 VITALS — BP 130/88 | HR 80 | Ht 68.0 in | Wt 281.0 lb

## 2023-03-12 DIAGNOSIS — I10 Essential (primary) hypertension: Secondary | ICD-10-CM | POA: Diagnosis not present

## 2023-03-12 MED ORDER — AMLODIPINE BESYLATE 5 MG PO TABS: 5.0000 mg | ORAL_TABLET | Freq: Every day | ORAL | 3 refills | Status: DC

## 2023-03-12 NOTE — Patient Instructions (Addendum)
Thank you for coming to the office today.  Keep on Amlodipine 5mg  daily Caution mild swelling in leg and ankle and foot. No harm from the swelling.   You have been referred for a Coronary Calcium Score Cardiac CT Scan. This is a screening test for patients aged 50-50+ with cardiovascular risk factors or who are healthy but would be interested in Cardiovascular Screening for heart disease. Even if there is a family history of heart disease, this imaging can be useful. Typically it can be done every 5+ years or at a different timeline we agree on  The scan will look at the chest and mainly focus on the heart and identify early signs of calcium build up or blockages within the heart arteries. It is not 100% accurate for identifying blockages or heart disease, but it is useful to help [place] predict who may have some early changes or be at risk in the future for a heart attack or cardiovascular problem.  The results are reviewed by a Cardiologist and they will document the results. It should become available on MyChart. Typically the results are divided into percentiles based on other patients of the same demographic and age. So it will compare your risk to others similar to you. If you have a higher score >99 or higher percentile >75%tile, it is recommended to consider Statin cholesterol therapy and or referral to Cardiologist. I will try to help explain your results and if we have questions we can contact the Cardiologist.  You will be contacted for scheduling. Usually it is done at any imaging facility through [facility], [facility] or [facility].  The cost is $99 flat fee total and it does not go through insurance, so no authorization is required.    Please schedule a Follow-up Appointment to: Return in about 6 months (around 09/10/2023), or if symptoms worsen or fail to improve, for 6 month HTN, updates.  If you have any other questions or concerns, please feel free  to call the office or send a message through MyChart. You may also schedule an earlier appointment if necessary.  Additionally, you may be receiving a survey about your experience at our office within a few days to 1 week by e-mail or mail. We value your feedback.  Saralyn Pilar, DO [facility], [place]

## 2023-03-12 NOTE — Progress Notes (Signed)
Subjective:    Patient ID: Caitlin Barrett, female    DOB: 30-Apr-1970, 52 y.o.   MRN: [id number]  Caitlin Barrett is a 52 y.o. female presenting on 03/12/2023 for Hospitalization Follow-up (Follow up from ED about 1 month high blood pressure with chest pain and pressure)   HPI  Discussed the use of AI scribe software for clinical note transcription with the patient, who gave verbal consent to proceed.      ED FOLLOW-UP VISIT  Hospital/Location: [facility] Date of ED Visit: 02/14/23  Reason for Presenting to ED: Hypertension (new diagnosis)  FOLLOW-UP  - ED provider note and record have been reviewed - Patient presents today about 1 month after recent ED visit. Brief summary of recent course, patient had symptoms of chest pain and pressure, and elevated blood pressure at 141/87 at routine ap 02/14/23 at weight management, sent to [facility], they did EKG with concern for changes reported to be sinus with some inverted T's in V1 and V2, compared to prior. She was sent to [facility] ED for further eval. The ED EKG was determined to be unremarkable or unchanged seemed to have resolved on EKG. Labs and evaluation done in ED, BP initially 148/109 down to 130s/80s. Labs showed negative troponin x 2, chest x-ray negative.  - Today reports overall has done well after discharge from ED. Symptoms of BP and chest pain resolving  Home BP had been 130s/80-90s, now improving further 132/84, and yesterday 128/71.  - New medications on discharge: Amlodipine 5mg  daily - Changes to current meds on discharge: none  Since starting the medication, the patient has noticed mild swelling in her legs, which she has been managing with compression socks  She now has goal to improve diet, DASH heart healthy, goal to limit stress  I have reviewed the discharge medication list, and have reconciled the current and discharge medications today.      09/10/2022    9:10 AM 08/07/2022    2:06 PM 01/31/2022    1:20  PM  Depression screen PHQ 2/9  Decreased Interest 1 0 1  Down, Depressed, Hopeless 1 0 1  PHQ - 2 Score 2 0 2  Altered sleeping 1  0  Tired, decreased energy 2  0  Change in appetite 2  1  Feeling bad or failure about yourself  1  1  Trouble concentrating 0  1  Moving slowly or fidgety/restless 0  0  Suicidal thoughts 0  0  PHQ-9 Score 8  5  Difficult doing work/chores   Somewhat difficult       09/10/2022    9:11 AM 01/31/2022    1:20 PM 02/08/2020    3:35 PM 02/10/2019    1:51 PM  GAD 7 : Generalized Anxiety Score  Nervous, Anxious, on Edge 2 2 0 2  Control/stop worrying 2 2 0 2  Worry too much - different things 2 2 0 2  Trouble relaxing 2 2 0 2  Restless 1 1 0 1  Easily annoyed or irritable 2 2 0 0  Afraid - awful might happen 0 1 0 2  Total GAD 7 Score 11 12 0 11  Anxiety Difficulty  Somewhat difficult Not difficult at all Somewhat difficult    Social History   Tobacco Use   Smoking status: Never    Passive exposure: Past   Smokeless tobacco: Never  Vaping Use   Vaping status: Never Used  Substance Use Topics   Alcohol use:  Yes    Comment: rare   Drug use: No    Review of Systems Per HPI unless specifically indicated above     Objective:    BP 130/88   Pulse 80   Ht 5\' 8"  (1.727 m)   Wt 281 lb (127.5 kg)   LMP 05/26/2016 (Exact Date)   BMI 42.73 kg/m   Wt Readings from Last 3 Encounters:  03/12/23 281 lb (127.5 kg)  02/14/23 282 lb (127.9 kg)  01/17/23 278 lb (126.1 kg)    Physical Exam Vitals and nursing note reviewed.  Constitutional:      General: She is not in acute distress.    Appearance: Normal appearance. She is well-developed. She is obese. She is not diaphoretic.     Comments: Well-appearing, comfortable, cooperative  HENT:     Head: Normocephalic and atraumatic.  Eyes:     General:        Right eye: No discharge.        Left eye: No discharge.     Conjunctiva/sclera: Conjunctivae normal.  Neck:     Thyroid: No thyromegaly.   Cardiovascular:     Rate and Rhythm: Normal rate and regular rhythm.     Heart sounds: Normal heart sounds. No murmur heard. Pulmonary:     Effort: Pulmonary effort is normal. No respiratory distress.     Breath sounds: Normal breath sounds. No wheezing or rales.  Musculoskeletal:        General: Normal range of motion.     Cervical back: Normal range of motion and neck supple.  Lymphadenopathy:     Cervical: No cervical adenopathy.  Skin:    General: Skin is warm and dry.     Findings: No erythema or rash.  Neurological:     Mental Status: She is alert and oriented to person, place, and time.  Psychiatric:        Mood and Affect: Mood normal.        Behavior: Behavior normal.        Thought Content: Thought content normal.     Comments: Well groomed, good eye contact, normal speech and thoughts     I have personally reviewed the radiology report from 02/14/23 on CXR.  CLINICAL DATA:  Chest pain.   EXAM: CHEST - 2 VIEW   COMPARISON:  Chest radiograph dated 10/01/2022.   FINDINGS: The heart size and mediastinal contours are within normal limits. Both lungs are clear. The visualized skeletal structures are unremarkable.   IMPRESSION: No active cardiopulmonary disease.     Electronically Signed   By: Elgie Collard M.D.   On: 02/14/2023 18:41  --------  Narrative & Impression  There was no ST segment deviation noted during stress. No T wave inversion was noted during stress. The study is normal. This is a low risk study. The left ventricular ejection fraction is normal (63%). There is no evidence for ischemia       03/09/20  ECHOCARDIOGRAM REPORT       Patient Name:   LAYLIE DAMASO Cutrona Date of Exam: 03/09/2020  Medical Rec #:  [id number]        Height:       69.0 in  Accession #:    [id number]       Weight:       239.0 lb  Date of Birth:  01-06-1971         BSA:          2.229 m  Patient  Age:    49 years         BP:           142/82 mmHg  Patient  Gender: F                HR:           65 bpm.  Exam Location:  [place]   Procedure: 2D Echo, Cardiac Doppler, Color Doppler and Intracardiac             Opacification Agent   Indications:    R07.9* Chest pain, unspecified    History:        Patient has no prior history of Echocardiogram  examinations.                 Signs/Symptoms:Chest Pain; Risk Factors:Dyslipidemia and                  Non-Smoker. GERD.    Sonographer:    Quentin Ore RDMS, RVT, RDCS  Referring Phys: [id number] BRIAN AGBOR-ETANG   IMPRESSIONS     1. Left ventricular ejection fraction, by estimation, is 55 to 60%. The  left ventricle has normal function. The left ventricle has no regional  wall motion abnormalities. Left ventricular diastolic parameters were  normal.   2. Right ventricular systolic function is normal. The right ventricular  size is normal.   3. The mitral valve is normal in structure. No evidence of mitral valve  regurgitation. No evidence of mitral stenosis.   4. The aortic valve is normal in structure. Aortic valve regurgitation is  not visualized. No aortic stenosis is present.   5. The inferior vena cava is dilated in size with >50% respiratory  variability, suggesting right atrial pressure of 8 mmHg.   FINDINGS   Left Ventricle: Left ventricular ejection fraction, by estimation, is 55  to 60%. The left ventricle has normal function. The left ventricle has no  regional wall motion abnormalities. Definity contrast agent was given IV  to delineate the left ventricular   endocardial borders. The left ventricular internal cavity size was normal  in size. There is no left ventricular hypertrophy. Left ventricular  diastolic parameters were normal.   Right Ventricle: The right ventricular size is normal. No increase in  right ventricular wall thickness. Right ventricular systolic function is  normal.   Left Atrium: Left atrial size was normal in size.   Right Atrium: Right atrial size  was normal in size.   Pericardium: There is no evidence of pericardial effusion.   Mitral Valve: The mitral valve is normal in structure. No evidence of  mitral valve regurgitation. No evidence of mitral valve stenosis.   Tricuspid Valve: The tricuspid valve is normal in structure. Tricuspid  valve regurgitation is not demonstrated. No evidence of tricuspid  stenosis.   Aortic Valve: The aortic valve is normal in structure. Aortic valve  regurgitation is not visualized. No aortic stenosis is present. Aortic  valve mean gradient measures 5.0 mmHg. Aortic valve peak gradient measures  8.8 mmHg. Aortic valve area, by VTI  measures 2.62 cm.   Pulmonic Valve: The pulmonic valve was normal in structure. Pulmonic valve  regurgitation is trivial. No evidence of pulmonic stenosis.   Aorta: The aortic root is normal in size and structure.   Venous: The inferior vena cava is dilated in size with greater than 50%  respiratory variability, suggesting right atrial pressure of 8 mmHg.   IAS/Shunts: No atrial level shunt detected by color  flow Doppler.     LEFT VENTRICLE  PLAX 2D  LVIDd:         4.80 cm      Diastology  LVIDs:         3.20 cm      LV e' medial:    8.49 cm/s  LV PW:         1.00 cm      LV E/e' medial:  12.2  LV IVS:        1.00 cm      LV e' lateral:   12.30 cm/s  LVOT diam:     2.20 cm      LV E/e' lateral: 8.5  LV SV:         90  LV SV Index:   40  LVOT Area:     3.80 cm    LV Volumes (MOD)  LV vol d, MOD A4C: 128.0 ml  LV vol s, MOD A4C: 48.9 ml  LV SV MOD A4C:     128.0 ml   RIGHT VENTRICLE             IVC  RV Basal diam:  3.50 cm     IVC diam: 2.20 cm  RV S prime:     13.60 cm/s  TAPSE (M-mode): 2.8 cm   LEFT ATRIUM             Index       RIGHT ATRIUM           Index  LA diam:        4.40 cm 1.97 cm/m  RA Area:     15.90 cm  LA Vol (A2C):   52.4 ml 23.51 ml/m RA Volume:   38.00 ml  17.05 ml/m  LA Vol (A4C):   52.5 ml 23.56 ml/m  LA Biplane Vol: 52.9  ml 23.74 ml/m   AORTIC VALVE                    PULMONIC VALVE  AV Area (Vmax):    2.93 cm     PV Vmax:       0.85 m/s  AV Area (Vmean):   2.66 cm     PV Peak grad:  2.9 mmHg  AV Area (VTI):     2.62 cm  AV Vmax:           148.00 cm/s  AV Vmean:          108.000 cm/s  AV VTI:            0.343 m  AV Peak Grad:      8.8 mmHg  AV Mean Grad:      5.0 mmHg  LVOT Vmax:         114.00 cm/s  LVOT Vmean:        75.700 cm/s  LVOT VTI:          0.236 m  LVOT/AV VTI ratio: 0.69    AORTA  Ao Root diam: 2.80 cm  Ao Asc diam:  3.00 cm  Ao Arch diam: 2.5 cm   MITRAL VALVE  MV Area (PHT): 5.13 cm     SHUNTS  MV Decel Time: 148 msec     Systemic VTI:  0.24 m  MV E velocity: 104.00 cm/s  Systemic Diam: 2.20 cm  MV A velocity: 77.60 cm/s  MV E/A ratio:  1.34   Debbe Odea MD  Electronically signed by Debbe Odea MD  Signature Date/Time: 03/09/2020/5:15:20 PM        Final     Results for orders placed or performed during the hospital encounter of 02/14/23  Basic metabolic panel   Collection Time: 02/14/23  5:10 PM  Result Value Ref Range   Sodium 136 135 - 145 mmol/L   Potassium 3.8 3.5 - 5.1 mmol/L   Chloride 100 98 - 111 mmol/L   CO2 26 22 - 32 mmol/L   Glucose, Bld 119 (H) 70 - 99 mg/dL   BUN 19 6 - 20 mg/dL   Creatinine, Ser 8.29 0.44 - 1.00 mg/dL   Calcium 9.0 8.9 - 56.2 mg/dL   GFR, Estimated >13 >08 mL/min   Anion gap 10 5 - 15  CBC   Collection Time: 02/14/23  5:10 PM  Result Value Ref Range   WBC 10.5 4.0 - 10.5 K/uL   RBC 4.37 3.87 - 5.11 MIL/uL   Hemoglobin 13.3 12.0 - 15.0 g/dL   HCT 65.7 84.6 - 96.2 %   MCV 90.6 80.0 - 100.0 fL   MCH 30.4 26.0 - 34.0 pg   MCHC 33.6 30.0 - 36.0 g/dL   RDW 95.2 84.1 - 32.4 %   Platelets 401 (H) 150 - 400 K/uL   nRBC 0.0 0.0 - 0.2 %  Troponin I (High Sensitivity)   Collection Time: 02/14/23  5:10 PM  Result Value Ref Range   Troponin I (High Sensitivity) 5 <18 ng/L  Troponin I (High Sensitivity)   Collection  Time: 02/14/23  7:50 PM  Result Value Ref Range   Troponin I (High Sensitivity) 5 <18 ng/L      Assessment & Plan:   Problem List Items Addressed This Visit     Essential hypertension - Primary   Relevant Medications   amLODipine (NORVASC) 5 MG tablet   Other Relevant Orders   CT CARDIAC SCORING (SELF PAY ONLY)      Hypertension New diagnosis with initial presentation of chest pain and pressure.  ED visit reviewed Blood pressure controlled on Amlodipine 5mg  daily with recent readings of 132/84 and 128/71. Mild leg swelling noted, a known side effect of Amlodipine.  -Continue Amlodipine 5mg  daily., 90 day with refills DASH diet -Check blood pressure regularly and report any significant changes. -Schedule follow-up in 6 months to monitor blood pressure control.  Cardiac Health Prior EKG changes noted at urgent care visit, but subsequent EKG at ER was not concerning. Prior stress test and echocardiogram in 2021 were unremarkable.   The 10-year ASCVD risk score (Arnett DK, et al., 2019) is: 2.6%   Values used to calculate the score:     Age: 19 years     Sex: Female     Is Non-Hispanic African American: No     Diabetic: No     Tobacco smoker: No     Systolic Blood Pressure: 130 mmHg     Is BP treated: Yes     HDL Cholesterol: 42 mg/dL     Total Cholesterol: 212 mg/dL  Low ASCVD risk However, given circumstances - order coronary artery calcium score CT scan to further evaluate cardiac health. -Continue current lifestyle modifications including diet and exercise.  General Health Maintenance -Continue annual physicals with gynecologist for progesterone and estradiol management. -Continue current diet and exercise regimen for overall health maintenance.         Orders Placed This Encounter  Procedures   CT CARDIAC SCORING (SELF PAY ONLY)    Standing Status:   Future  Expiration Date:   03/11/2024    Is patient pregnant?:   No    Preferred imaging location?:    [facility]    Meds ordered this encounter  Medications   amLODipine (NORVASC) 5 MG tablet    Sig: Take 1 tablet (5 mg total) by mouth daily.    Dispense:  90 tablet    Refill:  3    90 day    Follow up plan: Return in about 6 months (around 09/10/2023), or if symptoms worsen or fail to improve, for 6 month HTN, updates.   Saralyn Pilar, DO [facility] 03/12/2023, 10:47 AM

## 2023-03-21 ENCOUNTER — Encounter (INDEPENDENT_AMBULATORY_CARE_PROVIDER_SITE_OTHER): Payer: Self-pay | Admitting: Adult Health

## 2023-03-21 ENCOUNTER — Ambulatory Visit (INDEPENDENT_AMBULATORY_CARE_PROVIDER_SITE_OTHER): Payer: Commercial Managed Care - PPO | Admitting: Adult Health

## 2023-03-21 VITALS — BP 141/85 | HR 83 | Temp 97.4°F | Ht 68.0 in | Wt 274.0 lb

## 2023-03-21 DIAGNOSIS — I10 Essential (primary) hypertension: Secondary | ICD-10-CM

## 2023-03-21 DIAGNOSIS — F5105 Insomnia due to other mental disorder: Secondary | ICD-10-CM | POA: Diagnosis not present

## 2023-03-21 DIAGNOSIS — E88819 Insulin resistance, unspecified: Secondary | ICD-10-CM

## 2023-03-21 DIAGNOSIS — F99 Mental disorder, not otherwise specified: Secondary | ICD-10-CM

## 2023-03-21 DIAGNOSIS — F329 Major depressive disorder, single episode, unspecified: Secondary | ICD-10-CM

## 2023-03-21 DIAGNOSIS — E669 Obesity, unspecified: Secondary | ICD-10-CM

## 2023-03-21 DIAGNOSIS — E559 Vitamin D deficiency, unspecified: Secondary | ICD-10-CM

## 2023-03-21 DIAGNOSIS — Z6841 Body Mass Index (BMI) 40.0 and over, adult: Secondary | ICD-10-CM

## 2023-03-21 MED ORDER — TRAZODONE HCL 50 MG PO TABS: 25.0000 mg | ORAL_TABLET | Freq: Every evening | ORAL | 0 refills | Status: DC | PRN

## 2023-03-21 MED ORDER — DULOXETINE HCL 40 MG PO CPEP: 40.0000 mg | ORAL_CAPSULE | Freq: Every day | ORAL | 0 refills | Status: DC

## 2023-03-21 MED ORDER — VITAMIN D (ERGOCALCIFEROL) 1.25 MG (50000 UNIT) PO CAPS: ORAL_CAPSULE | ORAL | 0 refills | Status: DC

## 2023-03-21 NOTE — Progress Notes (Signed)
WEIGHT SUMMARY AND BIOMETRICS  Vitals Temp: (!) 97.4 F (36.3 C) BP: (!) 141/85 Pulse Rate: 83 SpO2: 97 %   Anthropometric Measurements Height: 5\' 8"  (1.727 m) Weight: 274 lb (124.3 kg) BMI (Calculated): 41.67 Weight at Last Visit: 278lb Weight Lost Since Last Visit: 4lb Starting Weight: 277lb Total Weight Loss (lbs): 3 lb (1.361 kg)   Body Composition  Body Fat %: 48.1 % Fat Mass (lbs): 131.8 lbs Muscle Mass (lbs): 135.2 lbs Total Body Water (lbs): 94 lbs Visceral Fat Rating : 14   Other Clinical Data Fasting: no Labs: no Today's Visit #: 63 Starting Date: 08/13/19    Chief Complaint:   OBESITY Caitlin Barrett is here to discuss her progress with her obesity treatment plan. She is on the the Category 2 Plan and states she is following her eating plan approximately 80 % of the time.  She states she is not currently exercising.   Interim History:  She has recently been following and elimination diet: She has stopped grains, dairy, or added sugar- been following > 3 weeks First week was a struggle, last two weeks easer to follow.  2025 Health Goals: 1) Increase regular exercise 2) Continue with elimnation eating plan  Subjective:   1. Essential hypertension 03/12/2023 PCP OV Notes TAIYA BOGUS is a 52 y.o. female presenting on 03/12/2023 for Hospitalization Follow-up (Follow up from ED about 1 month high blood pressure with chest pain and pressure)  Hypertension New diagnosis with initial presentation of chest pain and pressure.  ED visit reviewed Blood pressure controlled on Amlodipine 5mg  daily with recent readings of 132/84 and 128/71. Mild leg swelling noted, a known side effect of Amlodipine.   -Continue Amlodipine 5mg  daily., 90 day with refills DASH diet -Check blood pressure regularly and report any significant changes. -Schedule follow-up in 6 months to monitor blood pressure control.   Cardiac Health Prior EKG changes noted at urgent  care visit, but subsequent EKG at ER was not concerning. Prior stress test and echocardiogram in 2021 were unremarkable.    The 10-year ASCVD risk score (Arnett DK, et al., 2019) is: 2.6%   Values used to calculate the score:     Age: 38 years     Sex: Female     Is Non-Hispanic African American: No     Diabetic: No     Tobacco smoker: No     Systolic Blood Pressure: 130 mmHg     Is BP treated: Yes     HDL Cholesterol: 42 mg/dL     Total Cholesterol: 212 mg/dL   Low ASCVD risk However, given circumstances - order coronary artery calcium score CT scan to further evaluate cardiac health. -Continue current lifestyle modifications including diet and exercise.  2. Vitamin D deficiency  Latest Reference Range & Units 11/29/22 09:48 01/10/23 15:49  Vitamin D, 25-Hydroxy 30 - 100 ng/mL 65.4 70   She is on bi-weekly Ergocalciferol- denies N/V/Muscle Weakness  3. 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 Major depressive disorder, remission status unspecified, unspecified whether recurrent She reports stable mood, denies SI/HI She is still not ready for a new dog in the home. She is on daily Duloxetine 40mg  (mood stablization and tx of chronic pain)   Assessment/Plan:   1. Essential hypertension Continue daily Amlodipine 5mg   2. Vitamin D deficiency Refill - Vitamin D, Ergocalciferol, (DRISDOL) 1.25 MG (50000 UNIT) CAPS capsule; TAKE ONE CAPSULE BY MOUTH EVERY 14 DAYS  Dispense: 8 capsule; Refill: 0 Check Labs  3. Insulin Resistance Check Labs  4.Insomnia due to other mental disorder (Primary) Refill traZODone (DESYREL) 50 MG tablet Take 0.5-1 tablets (25-50 mg total) by mouth at bedtime as needed for sleep. Dispense: 30 tablet, Refills: 0 ordered   5. Major depressive disorder,  remission status unspecified, unspecified whether recurrent Refill - DULoxetine HCl 40 MG CPEP; Take 1 capsule (40 mg total) by mouth daily.  Dispense: 90 capsule; Refill: 0  Garrett is currently in the action stage of change. As such, her goal is to continue with weight loss efforts. She has agreed to the Category 2 Plan.   Exercise goals: All adults should avoid inactivity. Some physical activity is better than none, and adults who participate in any amount of physical activity gain some health benefits. Adults should also include muscle-strengthening activities that involve all major muscle groups on 2 or more days a week.  Behavioral modification strategies: increasing lean protein intake, decreasing simple carbohydrates, increasing vegetables, increasing water intake, meal planning and cooking strategies, keeping healthy foods in the home, ways to avoid boredom eating, and planning for success.  Helga has agreed to follow-up with our clinic in 4 weeks. She was informed of the importance of frequent follow-up visits to maximize her success with intensive lifestyle modifications for her multiple health conditions.   Texanna was informed we would discuss her lab results at her next visit unless there is a critical issue that needs to be addressed sooner. Joycelynn agreed to keep her next visit at the agreed upon time to discuss these results.  Objective:   Blood pressure (!) 141/85, pulse 83, temperature (!) 97.4 F (36.3 C), height 5\' 8"  (1.727 m), weight 274 lb (124.3 kg), last menstrual period 05/26/2016, SpO2 97%. Body mass index is 41.66 kg/m.  General: Cooperative, alert, well developed, in no acute distress. HEENT: Conjunctivae and lids unremarkable. Cardiovascular: Regular rhythm.  Lungs: Normal work of breathing. Neurologic: No focal deficits.   Lab Results  Component Value Date   CREATININE 0.77 02/14/2023   BUN 19 02/14/2023   NA 136 02/14/2023   K 3.8 02/14/2023    CL 100 02/14/2023   CO2 26 02/14/2023   Lab Results  Component Value Date   ALT 9 11/29/2022   AST 11 11/29/2022   ALKPHOS 56 11/29/2022   BILITOT 0.2 11/29/2022   Lab Results  Component Value Date   HGBA1C 5.4 11/29/2022   HGBA1C 5.2 08/28/2021   HGBA1C 5.3 05/03/2021   HGBA1C 5.3 12/29/2020   HGBA1C 5.3 03/31/2020   Lab Results  Component Value Date   INSULIN 17.1 11/29/2022   INSULIN 10.7 08/28/2021   INSULIN 17.5 05/03/2021   INSULIN 10.1 12/29/2020   INSULIN 8.9 06/09/2020   Lab Results  Component Value Date   TSH 0.18 (L) 01/10/2023   Lab Results  Component Value Date   CHOL 212 (H) 11/29/2022   HDL 42 11/29/2022   LDLCALC 143 (H) 11/29/2022   TRIG 149 11/29/2022   CHOLHDL 5.0 (H) 11/29/2022   Lab Results  Component Value Date   VD25OH 70 01/10/2023   VD25OH 65.4 11/29/2022   VD25OH 45.2 05/02/2022   Lab Results  Component Value Date   WBC 10.5 02/14/2023   HGB 13.3 02/14/2023   HCT 39.6 02/14/2023   MCV 90.6 02/14/2023  PLT 401 (H) 02/14/2023   Lab Results  Component Value Date   IRON 64 08/13/2019   TIBC 333 08/13/2019   FERRITIN 30 08/13/2019   Attestation Statements:   Reviewed by clinician on day of visit: allergies, medications, problem list, medical history, surgical history, family history, social history, and previous encounter notes.  I have reviewed the above documentation for accuracy and completeness, and I agree with the above. -  Dashawn Bartnick d. Dalylah Ramey, NP-C

## 2023-03-22 LAB — INSULIN, RANDOM: INSULIN: 9.3 u[IU]/mL (ref 2.6–24.9)

## 2023-03-22 LAB — VITAMIN B12: Vitamin B-12: 163 pg/mL — ABNORMAL LOW (ref 232–1245)

## 2023-03-22 LAB — HEMOGLOBIN A1C
Est. average glucose Bld gHb Est-mCnc: 103 mg/dL
Hgb A1c MFr Bld: 5.2 % (ref 4.8–5.6)

## 2023-03-22 LAB — VITAMIN D 25 HYDROXY (VIT D DEFICIENCY, FRACTURES): Vit D, 25-Hydroxy: 49.2 ng/mL (ref 30.0–100.0)

## 2023-04-15 ENCOUNTER — Encounter (INDEPENDENT_AMBULATORY_CARE_PROVIDER_SITE_OTHER): Payer: Self-pay | Admitting: Adult Health

## 2023-04-16 ENCOUNTER — Telehealth (INDEPENDENT_AMBULATORY_CARE_PROVIDER_SITE_OTHER): Payer: Commercial Managed Care - PPO | Admitting: Adult Health

## 2023-04-16 ENCOUNTER — Encounter (INDEPENDENT_AMBULATORY_CARE_PROVIDER_SITE_OTHER): Payer: Self-pay | Admitting: Adult Health

## 2023-04-16 ENCOUNTER — Telehealth (INDEPENDENT_AMBULATORY_CARE_PROVIDER_SITE_OTHER): Payer: Self-pay

## 2023-04-16 VITALS — Ht 68.0 in

## 2023-04-16 DIAGNOSIS — E538 Deficiency of other specified B group vitamins: Secondary | ICD-10-CM

## 2023-04-16 DIAGNOSIS — E88819 Insulin resistance, unspecified: Secondary | ICD-10-CM | POA: Diagnosis not present

## 2023-04-16 DIAGNOSIS — Z6841 Body Mass Index (BMI) 40.0 and over, adult: Secondary | ICD-10-CM

## 2023-04-16 DIAGNOSIS — E559 Vitamin D deficiency, unspecified: Secondary | ICD-10-CM | POA: Diagnosis not present

## 2023-04-16 DIAGNOSIS — R112 Nausea with vomiting, unspecified: Secondary | ICD-10-CM | POA: Diagnosis not present

## 2023-04-16 DIAGNOSIS — F5105 Insomnia due to other mental disorder: Secondary | ICD-10-CM

## 2023-04-16 DIAGNOSIS — F329 Major depressive disorder, single episode, unspecified: Secondary | ICD-10-CM

## 2023-04-16 MED ORDER — ONDANSETRON HCL 8 MG PO TABS: 8.0000 mg | ORAL_TABLET | Freq: Three times a day (TID) | ORAL | 0 refills | Status: DC | PRN

## 2023-04-16 MED ORDER — DULOXETINE HCL 40 MG PO CPEP: 40.0000 mg | ORAL_CAPSULE | Freq: Every day | ORAL | 0 refills | Status: DC

## 2023-04-16 MED ORDER — TRAZODONE HCL 50 MG PO TABS: 25.0000 mg | ORAL_TABLET | Freq: Every evening | ORAL | 0 refills | Status: DC | PRN

## 2023-04-16 MED ORDER — VITAMIN D (ERGOCALCIFEROL) 1.25 MG (50000 UNIT) PO CAPS: ORAL_CAPSULE | ORAL | 0 refills | Status: DC

## 2023-04-16 MED ORDER — CYANOCOBALAMIN 500 MCG PO TABS: 500.0000 ug | ORAL_TABLET | Freq: Every day | ORAL | 0 refills | Status: DC

## 2023-04-16 NOTE — Progress Notes (Signed)
WEIGHT SUMMARY AND BIOMETRICS  Vitals Temp: 0 F (-17.8 C) (Video Visit) BP: -- (Video Visit) Pulse Rate: 0 (Video Visit) SpO2: 0 % (Video Visit)   Anthropometric Measurements Height: 5\' 8"  (1.727 m) Weight:  (0 kg) (Video Visit) Weight at Last Visit: 274lb Weight Lost Since Last Visit: -- (Video Visit) Weight Gained Since Last Visit: -- (Video Visit) Starting Weight: 277lb Total Weight Loss (lbs): 3 lb (1.361 kg)   Body Composition  Body Fat %: 0 % (Video Visit) Fat Mass (lbs): 0 lbs (Video Visit) Muscle Mass (lbs): 0 lbs (Video Visit) Total Body Water (lbs): 0 lbs (Video Visit) Visceral Fat Rating : 0 (Video Visit)   Other Clinical Data Fasting: no Labs: no Today's Visit #: 50 Starting Date: 08/13/19    Chief Complaint:  I connected with  Caitlin Barrett on 04/16/23 by a video and audio enabled telemedicine application and verified that I am speaking with the correct person using two identifiers.  Patient Location: Home  Provider Location: Office/Clinic  I discussed the limitations of evaluation and management by telemedicine. The patient expressed understanding and agreed to proceed.  OBESITY Empryss is here to discuss her progress with her obesity treatment plan. She is on the the Category 2 Plan and states she is following her eating plan approximately 80 % of the time.  She states she is exercising: None- acutely ill with GI upset  Interim History:  Caitlin Barrett has been experiencing nausea/vomiting yesterday. Today she is experiencing fatigue and diarrhea. She denies fever or dyspnea. She reports that her mother and niece were acutely ill with similar sx's last week.  Her   Subjective:   1. Insulin resistance Discussed Labs  Latest Reference Range & Units 03/21/23 11:42  Hemoglobin A1C 4.8 - 5.6 % 5.2  Est. average glucose Bld gHb Est-mCnc mg/dL 098  INSULIN 2.6 - 11.9 uIU/mL 9.3   A1c at goal Insulin level improved from 17 at last check  to  current level of 9.3 She is not currently on any antidiabetic medications at present.  2. B12 deficiency Discussed Labs  Latest Reference Range & Units 03/21/23 11:42  Vitamin D, 25-Hydroxy 30.0 - 100.0 ng/mL 49.2  Vitamin B12 232 - 1,245 pg/mL 163 (L)  (L): Data is abnormally low  B12 level sub therapeutic Vit D level at goal She is on daily OTC multivitamin  3. Vitamin D deficiency Discussed Labs  Latest Reference Range & Units 03/21/23 11:42  Vitamin D, 25-Hydroxy 30.0 - 100.0 ng/mL 49.2  Vitamin B12 232 - 1,245 pg/mL 163 (L)  (L): Data is abnormally low  Vit D level at goal She is on daily OTC multivitamin and bi-weekly Ergocalciferol  4. N/V Caitlin Barrett has been experiencing nausea/vomiting yesterday. Today she is experiencing fatigue and diarrhea. She denies fever or dyspnea. She reports that her mother and niece were acutely ill with similar sx's last week. 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 Major depressive disorder, remission status unspecified, unspecified whether recurrent She endorses stable mood, denies SI/HI She is on daily Cymbalta 40mg - for mood and fibromyalgia pain tx  Assessment/Plan:   1. Insulin resistance Continue to limit sugar/simple CHO Increase protein and when feeling better- increase daily activitiy  2. B12 deficiency  Start  cyanocobalamin (VITAMIN B12) 500 MCG tablet Take 1 tablet (500 mcg total) by mouth daily. Dispense: 90 tablet, Refills: 0 ordered   3. Vitamin D deficiency Refill - Vitamin D, Ergocalciferol, (DRISDOL) 1.25 MG (50000 UNIT) CAPS capsule; TAKE ONE CAPSULE BY MOUTH EVERY 14 DAYS  Dispense: 8 capsule; Refill: 0  4. N/V Bland Diet- MyChart message sent with information EKGs review-  stable Start ondansetron (ZOFRAN) 8 MG tablet Take 1 tablet (8 mg total) by mouth every 8 (eight) hours as needed for nausea or vomiting. 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 Major depressive disorder, remission status unspecified, unspecified whether recurrent Refill - DULoxetine HCl 40 MG CPEP; Take 1 capsule (40 mg total) by mouth daily.  Dispense: 90 capsule; Refill: 0  6. Morbid obesity (HCC), Current BMI 41.67  Caitlin Barrett is currently in the action stage of change. As such, her goal is to continue with weight loss efforts. She has agreed to the Category 2 Plan.   Exercise goals: No exercise has been prescribed at this time.  Behavioral modification strategies: increasing lean protein intake, decreasing simple carbohydrates, increasing vegetables, increasing water intake, meal planning and cooking strategies, keeping healthy foods in the home, ways to avoid boredom eating, ways to avoid night time snacking, and planning for success.  [name] has agreed to follow-up with our clinic in 4 weeks. She was informed of the importance of frequent follow-up visits to maximize her success with intensive lifestyle modifications for her multiple health conditions.   Objective:   Height 5\' 8"  (1.727 m), last menstrual period 05/26/2016. Body mass index is 41.66 kg/m.  General: Cooperative, alert, well developed, in no acute distress. HEENT: Conjunctivae and lids unremarkable. Cardiovascular: Regular rhythm.  Lungs: Normal work of breathing. Neurologic: No focal deficits.   Lab Results  Component Value Date   CREATININE 0.77 02/14/2023   BUN 19 02/14/2023   NA 136 02/14/2023   K 3.8 02/14/2023   CL 100 02/14/2023   CO2 26 02/14/2023   Lab Results  Component Value Date   ALT 9 11/29/2022   AST 11 11/29/2022   ALKPHOS 56 11/29/2022   BILITOT 0.2 11/29/2022   Lab Results  Component Value Date   HGBA1C 5.2  03/21/2023   HGBA1C 5.4 11/29/2022   HGBA1C 5.2 08/28/2021   HGBA1C 5.3 05/03/2021   HGBA1C 5.3 12/29/2020   Lab Results  Component Value Date   INSULIN 9.3 03/21/2023   INSULIN 17.1 11/29/2022   INSULIN 10.7 08/28/2021   INSULIN 17.5 05/03/2021   INSULIN 10.1 12/29/2020   Lab Results  Component Value Date   TSH 0.18 (L) 01/10/2023   Lab Results  Component Value Date   CHOL 212 (H) 11/29/2022   HDL 42 11/29/2022   LDLCALC 143 (H) 11/29/2022   TRIG 149 11/29/2022   CHOLHDL 5.0 (H) 11/29/2022   Lab Results  Component Value Date   VD25OH 49.2 03/21/2023   VD25OH 70 01/10/2023   VD25OH 65.4 11/29/2022   Lab Results  Component Value Date   WBC 10.5 02/14/2023   HGB 13.3 02/14/2023   HCT 39.6 02/14/2023   MCV 90.6 02/14/2023   PLT 401 (H) 02/14/2023   Lab Results  Component Value Date   IRON 64 08/13/2019   TIBC 333 08/13/2019   FERRITIN 30 08/13/2019   Attestation Statements:   Reviewed by clinician on day of visit: allergies, medications, problem list, medical history, surgical  history, family history, social history, and previous encounter notes.  Time spent on visit including pre-visit chart review and post-visit care and charting was 15 minutes.   I have reviewed the above documentation for accuracy and completeness, and I agree with the above. -  Khylan Sawyer d. Abou Sterkel, NP-C

## 2023-04-16 NOTE — Telephone Encounter (Signed)
Called patient at 10:04 to go over screening questions for her video visit later today, but she didn't answer. Her voicemail was full so I could not leave a message.

## 2023-04-17 ENCOUNTER — Other Ambulatory Visit: Payer: Self-pay | Admitting: Obstetrics and Gynecology

## 2023-04-30 ENCOUNTER — Ambulatory Visit: Payer: Commercial Managed Care - PPO

## 2023-05-02 ENCOUNTER — Other Ambulatory Visit (INDEPENDENT_AMBULATORY_CARE_PROVIDER_SITE_OTHER): Payer: Self-pay | Admitting: Adult Health

## 2023-05-02 DIAGNOSIS — E559 Vitamin D deficiency, unspecified: Secondary | ICD-10-CM

## 2023-05-13 ENCOUNTER — Ambulatory Visit
Admission: RE | Admit: 2023-05-13 | Discharge: 2023-05-13 | Disposition: A | Discharge status: Home / Self Care | Payer: Commercial Managed Care - PPO | Source: Ambulatory Visit | Attending: Emergency Medicine | Admitting: Emergency Medicine

## 2023-05-13 VITALS — BP 148/88 | HR 72 | Temp 98.1°F | Resp 17

## 2023-05-13 DIAGNOSIS — B372 Candidiasis of skin and nail: Secondary | ICD-10-CM

## 2023-05-13 DIAGNOSIS — B3731 Acute candidiasis of vulva and vagina: Secondary | ICD-10-CM | POA: Diagnosis not present

## 2023-05-13 DIAGNOSIS — B9689 Other specified bacterial agents as the cause of diseases classified elsewhere: Secondary | ICD-10-CM

## 2023-05-13 DIAGNOSIS — N760 Acute vaginitis: Secondary | ICD-10-CM

## 2023-05-13 LAB — WET PREP, GENITAL
Sperm: NONE SEEN
Trich, Wet Prep: NONE SEEN
WBC, Wet Prep HPF POC: <10 — AB (ref <10)

## 2023-05-13 MED ORDER — FLUCONAZOLE 150 MG PO TABS
150.0000 mg | ORAL_TABLET | ORAL | 0 refills | Status: AC
Start: 2023-05-13 — End: 2023-05-20

## 2023-05-13 MED ORDER — METRONIDAZOLE 500 MG PO TABS: 500.0000 mg | ORAL_TABLET | Freq: Two times a day (BID) | ORAL | 0 refills | Status: DC

## 2023-05-13 MED ORDER — NYSTATIN 100000 UNIT/GM EX CREA: TOPICAL_CREAM | CUTANEOUS | 0 refills | Status: DC

## 2023-05-13 NOTE — ED Triage Notes (Signed)
Vaginal itching and discharge x 3 weeks. OTC meds not working. Rash under belly that's itching and smells.

## 2023-05-13 NOTE — Discharge Instructions (Addendum)
Take the Flagyl (metronidazole) 500 mg twice daily for treatment of your bacterial vaginosis.  Avoid alcohol while on the metronidazole as taken together will cause of vomiting.  Bacterial vaginosis is often caused by a imbalance of bacteria in your vaginal vault.  This is sometimes a result of using tampons or hormonal fluctuations during her menstrual cycle.  You if your symptoms are recurrent you can try using a boric acid suppository twice weekly to help maintain the acid-base balance in your vagina vault which could prevent further infection.  You can also try vaginal probiotics to help return normal bacterial balance.   For your vaginal yeast infection take 1 Diflucan tablet now and repeat dosing every 3 days for total of 3 doses.  For the yeast infection under skin fold apply the nystatin cream twice daily until the rash is resolved and then for 3 additional days.  Allow as much airflow as possible to the area with a rash as used like to grow in a warm, moist, dark environment.  Place gauze between her skin folds to help allow for airflow.  Where reasonable, natural fiber garments as much as possible as synthetic garments tend to trap moisture and can facilitate yeast growth.

## 2023-05-13 NOTE — ED Provider Notes (Signed)
[facility]    CSN: [id number] Arrival date & time: 05/13/23  1346      History   Chief Complaint Chief Complaint  Patient presents with   Vaginal Itching    Vaginal discharge and itching. Tried OTC yeast creams - Entered by patient   Rash    HPI Caitlin Barrett is a 53 y.o. female.   HPI  53 year old female with past medical history significant for GERD, hypothyroidism, IBS, basal cell skin cancer, anxiety, anemia, asthma, and eczema presents for evaluation of white, clumpy vaginal discharge and itching that is been present for the last 3 weeks.  She denies any odor or blood.  She also denies any concerns for STIs.  Additionally, she is experiencing an area of redness underneath the skin fold of her pannus that is itchy and moist and has been PRESENT for the last 3 weeks also.  She denies any recent antibiotic use.  Past Medical History:  Diagnosis Date   Allergy    seasonal.  Takes allergy shots at [facility]   Anemia    Anxiety    Arthritis    Asthma    exercise induced--no inhalers   Back pain    Cancer (HCC) 2010   Basal cell skin cancer Lt eye, skin graft above Lt eye   Constipation    Costochondritis    Depression    Dysmenorrhea    Eczema    Edema, lower extremity    GERD (gastroesophageal reflux disease)    Headache    Hypothyroid    Nodules and Fine needle aspiration   IBS (irritable bowel syndrome)    Lab test positive for detection of COVID-19 virus    November 2020   Myalgia    Osteoarthritis    Painful menstrual periods    Palpitations    Plantar fasciitis    Shortness of breath    Sprain of carpal (joint) of wrist    Vitamin B 12 deficiency    Vitamin D deficiency     Patient Active Problem List   Diagnosis Date Noted   BMI 39.0-39.9,adult-current bmi 39.4 06/20/2022   Morbid obesity (HCC)-start bmi 40.91 06/20/2022   Depression 08/11/2021   Bilateral lower extremity edema 08/06/2021   Status post nasal septoplasty  03/30/2021   Colon cancer screening    Polyp of colon    Depressed mood 12/01/2020   Nausea without vomiting 11/07/2020   Thyroid disease 06/09/2020   Cervical strain 04/25/2020   Fatigue 04/04/2020   GAD (generalized anxiety disorder) 02/08/2020   Pericardial pain 01/28/2020   Muscle soreness 01/28/2020   Mixed hyperlipidemia 11/25/2019   Essential hypertension 11/25/2019   Polyphagia 10/08/2019   Vitamin D deficiency 09/17/2019   Insulin resistance 09/17/2019   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   Class 3 severe obesity with serious comorbidity and body mass index (BMI) of 40.0 to 44.9 in adult [facility]) 09/17/2019   Exercise-induced asthma 04/22/2018   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   Monilial vaginitis 02/26/2017   Decreased libido 02/26/2017   Major depressive disorder, recurrent, in full remission (HCC) 02/26/2017   Dyspareunia, female 02/26/2017   S/P TAH (total abdominal hysterectomy) 07/02/2016   Nevus of abdominal wall 06/11/2016   Morton's neuralgia 07/21/2015   Nocturia 07/21/2015   Stress incontinence 07/21/2015   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   Allergic rhinitis 06/21/2015   Obesity (BMI 30-39.9) 06/21/2015    Past Surgical History:  Procedure Laterality Date   ABDOMINAL HYSTERECTOMY Bilateral 07/02/2016  Procedure: HYSTERECTOMY ABDOMINAL WITH BILATERAL SALPINGECTOMY-MIDLINE INCISION;  Surgeon: Herold Harms, MD;  Location: [facility];  Service: Gynecology;  Laterality: Bilateral;   CHOLECYSTECTOMY  2009   COLONOSCOPY WITH PROPOFOL N/A 01/26/2021   Procedure: COLONOSCOPY WITH PROPOFOL;  Surgeon: Toney Reil, MD;  Location: [facility];  Service: Gastroenterology;  Laterality: N/A;   fibromalga     HERNIA REPAIR  1974   umbilical   NASAL SEPTOPLASTY W/ TURBINOPLASTY Bilateral 03/03/2021   Procedure: NASAL SEPTOPLASTY WITH SUBMUCOSAL RESECTION OF TURBINATE;  Surgeon: Linus Salmons, MD;  Location: [facility];  Service: ENT;   Laterality: Bilateral;   SKIN GRAFT  2010   TONSILLECTOMY      OB History     Gravida  0   Para  0   Term  0   Preterm  0   AB  0   Living  0      SAB  0   IAB  0   Ectopic  0   Multiple  0   Live Births  0            Home Medications    Prior to Admission medications   Medication Sig Start Date End Date Taking? Authorizing Provider  amLODipine (NORVASC) 5 MG tablet Take 1 tablet (5 mg total) by mouth daily. 03/12/23 03/11/24 Yes Karamalegos, Netta Neat, DO  COLLAGEN-BORON-HYALURONIC ACID PO Take by mouth.   Yes [provider]  DULoxetine HCl 40 MG CPEP Take 1 capsule (40 mg total) by mouth daily. 04/16/23  Yes Danford, Orpha Bur D, NP  estradiol (ESTRACE) 1 MG tablet Take 1 tablet (1 mg total) by mouth daily. 08/07/22  Yes Hildred Laser, MD  fluconazole (DIFLUCAN) 150 MG tablet Take 1 tablet (150 mg total) by mouth every 3 (three) days for 3 doses. 05/13/23 05/20/23 Yes Becky Augusta, NP  metroNIDAZOLE (FLAGYL) 500 MG tablet Take 1 tablet (500 mg total) by mouth 2 (two) times daily. 05/13/23  Yes Becky Augusta, NP  Multiple Vitamins-Minerals (MULTIVITAMIN WOMEN PO) Take by mouth.   Yes [provider]  nystatin cream (MYCOSTATIN) Apply to affected area 2 times daily 05/13/23  Yes Becky Augusta, NP  omeprazole (PRILOSEC) 40 MG capsule Take 40 mg by mouth as needed.   Yes [provider]  progesterone (PROMETRIUM) 200 MG capsule TAKE 1 CAPSULE BY MOUTH EVERY DAY 04/19/23  Yes Hildred Laser, MD  rizatriptan (MAXALT-MLT) 10 MG disintegrating tablet Take 1 tablet (10 mg total) by mouth as needed for migraine. May repeat in 2 hours if needed. Max dose is 2 tabs in 24 hours. 11/28/22  Yes Karamalegos, Netta Neat, DO  traZODone (DESYREL) 50 MG tablet Take 0.5-1 tablets (25-50 mg total) by mouth at bedtime as needed for sleep. 04/16/23  Yes Danford, Orpha Bur D, NP  albuterol (VENTOLIN HFA) 108 (90 Base) MCG/ACT inhaler TAKE 2 PUFFS BY MOUTH EVERY 6 HOURS AS NEEDED  FOR WHEEZE OR SHORTNESS OF BREATH 10/01/22   Brimage, Vondra, DO  clobetasol cream (TEMOVATE) 0.05 % Apply 1 application topically 2 (two) times daily. For up to 1-2 weeks then as needed. Do not use on sensitive skin. 04/24/19   Karamalegos, Netta Neat, DO  cyanocobalamin (VITAMIN B12) 500 MCG tablet Take 1 tablet (500 mcg total) by mouth daily. 04/16/23   Danford, Orpha Bur D, NP  cyclobenzaprine (FLEXERIL) 10 MG tablet Take 1 tablet (10 mg total) by mouth at bedtime as needed for muscle spasms. 08/25/22   Katha Cabal, DO  EPINEPHrine  0.3 mg/0.3 mL IJ SOAJ injection 0.3 mg as needed. 09/25/08   [provider]  fluticasone (FLONASE) 50 MCG/ACT nasal spray Place into both nostrils as needed. 05/17/21   [provider]  loratadine (CLARITIN) 10 MG tablet Take 10 mg by mouth daily as needed. 10/13/20   [provider]  Misc Natural Products (OSTEO BI-FLEX ADV TRIPLE ST PO) Take by mouth.    [provider]  ondansetron (ZOFRAN) 8 MG tablet Take 1 tablet (8 mg total) by mouth every 8 (eight) hours as needed for nausea or vomiting. 04/16/23   Danford, Orpha Bur D, NP  Probiotic Product (TRUBIOTICS) CAPS Take 1 capsule by mouth daily.    [provider]  triamcinolone cream (KENALOG) 0.1 % APPLY THIN FILM TWICE DAILY TO ECZEMA UNTIL CLEAR 12/23/18   [provider]  TURMERIC PO Take by mouth daily.    [provider]  VITAMIN D PO Take 1,000 Units by mouth. 6 days weekly    [provider]  Vitamin D, Ergocalciferol, (DRISDOL) 1.25 MG (50000 UNIT) CAPS capsule TAKE ONE CAPSULE BY MOUTH EVERY 14 DAYS 04/16/23   Danford, Jinny Blossom, NP    Family History Family History  Problem Relation Age of Onset   Diabetes Mother    Hyperlipidemia Mother    Hypertension Mother    Sleep apnea Mother    Obesity Mother    Cancer Father        lung CA   Heart disease Maternal Grandmother    Stroke Maternal Grandmother    Hypertension Maternal Grandmother     Diabetes Maternal Grandmother    Heart disease Maternal Grandfather    Diabetes Maternal Grandfather    Heart attack Maternal Grandfather    Breast cancer Neg Hx     Social History Social History   Tobacco Use   Smoking status: Never    Passive exposure: Past   Smokeless tobacco: Never  Vaping Use   Vaping status: Never Used  Substance Use Topics   Alcohol use: Yes    Comment: rare   Drug use: No     Allergies   Iodinated contrast media and Codeine   Review of Systems Review of Systems  Gastrointestinal:  Negative for abdominal pain.  Genitourinary:  Positive for vaginal discharge and vaginal pain. Negative for vaginal bleeding.  Skin:  Positive for color change and rash.     Physical Exam Triage Vital Signs ED Triage Vitals  Encounter Vitals Group     BP      Systolic BP Percentile      Diastolic BP Percentile      Pulse      Resp      Temp      Temp src      SpO2      Weight      Height      Head Circumference      Peak Flow      Pain Score      Pain Loc      Pain Education      Exclude from Growth Chart    No data found.  Updated Vital Signs BP (!) 148/88 (BP Location: Right Arm)   Pulse 72   Temp 98.1 F (36.7 C) (Oral)   Resp 17   LMP 05/26/2016 (Exact Date)   SpO2 96%   Visual Acuity Right Eye Distance:   Left Eye Distance:   Bilateral Distance:    Right Eye Near:  Left Eye Near:    Bilateral Near:     Physical Exam Vitals and nursing note reviewed.  Constitutional:      Appearance: Normal appearance. She is not ill-appearing.  Cardiovascular:     Rate and Rhythm: Normal rate and regular rhythm.     Pulses: Normal pulses.     Heart sounds: Normal heart sounds. No murmur heard.    No friction rub. No gallop.  Pulmonary:     Effort: Pulmonary effort is normal.     Breath sounds: Normal breath sounds. No wheezing, rhonchi or rales.  Abdominal:     Palpations: Abdomen is soft.     Tenderness: There is no abdominal  tenderness. There is no guarding or rebound.  Skin:    General: Skin is warm and dry.     Capillary Refill: Capillary refill takes less than 2 seconds.     Findings: Erythema and rash present.  Neurological:     General: No focal deficit present.     Mental Status: She is alert and oriented to person, place, and time.      UC Treatments / Results  Labs (all labs ordered are listed, but only abnormal results are displayed) Labs Reviewed  WET PREP, GENITAL - Abnormal; Notable for the following components:      Result Value   Yeast Wet Prep HPF POC PRESENT (*)    Clue Cells Wet Prep HPF POC PRESENT (*)    WBC, Wet Prep HPF POC <10 (*)    All other components within normal limits    EKG   Radiology No results found.  Procedures Procedures (including critical care time)  Medications Ordered in UC Medications - No data to display  Initial Impression / Assessment and Plan / UC Course  I have reviewed the triage vital signs and the nursing notes.  Pertinent labs & imaging results that were available during my care of the patient were reviewed by me and considered in my medical decision making (see chart for details).   Patient is a pleasant, nontoxic-appearing 53 year old female presenting for evaluation of vaginal itching and discharge along with a skin rash underneath the skin fold of her abdominal pannus as outlined HPI above.  The skin rash is erythematous with a moist surface and scaly appearance suggestive of a dermal yeast infection.  The patient reports that she has had similar rashes in the past that she treats with diaper rash cream until it resolves.  The current rash has been there for 3 weeks which also coincides with her white clumpy vaginal discharge.  I will order a vaginal wet prep to evaluate the presence of BV or vaginal yeast infection.  Vaginal wet prep is positive for both yeast and BV.  I will discharge patient home with a diagnosis of skin yeast infection,  vaginal yeast infection, and bacterial vaginosis.  I will start her on metronidazole 500 mg twice daily for 7 days for bacterial vaginosis and Diflucan 150 mg tablets, 1 tablet now and repeat dosing every 3 days for vaginal yeast infection.  I will also prescribe nystatin cream that she can apply to the underside of her pannus twice daily to help with the skin yeast infection.   Final Clinical Impressions(s) / UC Diagnoses   Final diagnoses:  BV (bacterial vaginosis)  Vaginal yeast infection  Skin yeast infection     Discharge Instructions      Take the Flagyl (metronidazole) 500 mg twice daily for treatment of your bacterial  vaginosis.  Avoid alcohol while on the metronidazole as taken together will cause of vomiting.  Bacterial vaginosis is often caused by a imbalance of bacteria in your vaginal vault.  This is sometimes a result of using tampons or hormonal fluctuations during her menstrual cycle.  You if your symptoms are recurrent you can try using a boric acid suppository twice weekly to help maintain the acid-base balance in your vagina vault which could prevent further infection.  You can also try vaginal probiotics to help return normal bacterial balance.   For your vaginal yeast infection take 1 Diflucan tablet now and repeat dosing every 3 days for total of 3 doses.  For the yeast infection under skin fold apply the nystatin cream twice daily until the rash is resolved and then for 3 additional days.  Allow as much airflow as possible to the area with a rash as used like to grow in a warm, moist, dark environment.  Place gauze between her skin folds to help allow for airflow.  Where reasonable, natural fiber garments as much as possible as synthetic garments tend to trap moisture and can facilitate yeast growth.     ED Prescriptions     Medication Sig Dispense Auth. Provider   fluconazole (DIFLUCAN) 150 MG tablet Take 1 tablet (150 mg total) by mouth every 3 (three)  days for 3 doses. 3 tablet Becky Augusta, NP   metroNIDAZOLE (FLAGYL) 500 MG tablet Take 1 tablet (500 mg total) by mouth 2 (two) times daily. 14 tablet Becky Augusta, NP   nystatin cream (MYCOSTATIN) Apply to affected area 2 times daily 30 g Becky Augusta, NP      PDMP not reviewed this encounter.   Becky Augusta, NP 05/13/23 1420

## 2023-05-14 ENCOUNTER — Encounter (INDEPENDENT_AMBULATORY_CARE_PROVIDER_SITE_OTHER): Payer: Self-pay | Admitting: Adult Health

## 2023-05-14 ENCOUNTER — Ambulatory Visit (INDEPENDENT_AMBULATORY_CARE_PROVIDER_SITE_OTHER): Payer: Commercial Managed Care - PPO | Admitting: Adult Health

## 2023-05-14 VITALS — BP 130/77 | HR 71 | Temp 98.3°F | Ht 68.0 in | Wt 277.0 lb

## 2023-05-14 DIAGNOSIS — Z6841 Body Mass Index (BMI) 40.0 and over, adult: Secondary | ICD-10-CM

## 2023-05-14 DIAGNOSIS — F99 Mental disorder, not otherwise specified: Secondary | ICD-10-CM

## 2023-05-14 DIAGNOSIS — E88819 Insulin resistance, unspecified: Secondary | ICD-10-CM | POA: Diagnosis not present

## 2023-05-14 DIAGNOSIS — E559 Vitamin D deficiency, unspecified: Secondary | ICD-10-CM | POA: Diagnosis not present

## 2023-05-14 DIAGNOSIS — I10 Essential (primary) hypertension: Secondary | ICD-10-CM

## 2023-05-14 DIAGNOSIS — E538 Deficiency of other specified B group vitamins: Secondary | ICD-10-CM

## 2023-05-14 DIAGNOSIS — F5105 Insomnia due to other mental disorder: Secondary | ICD-10-CM

## 2023-05-14 MED ORDER — VITAMIN D (ERGOCALCIFEROL) 1.25 MG (50000 UNIT) PO CAPS: ORAL_CAPSULE | ORAL | 0 refills | Status: DC

## 2023-05-14 MED ORDER — TRAZODONE HCL 50 MG PO TABS: 25.0000 mg | ORAL_TABLET | Freq: Every evening | ORAL | 0 refills | Status: DC | PRN

## 2023-05-14 NOTE — Progress Notes (Signed)
WEIGHT SUMMARY AND BIOMETRICS  Vitals Temp: 98.3 F (36.8 C) BP: 130/77 Pulse Rate: 71 SpO2: 98 %   Anthropometric Measurements Height: 5\' 8"  (1.727 m) Weight: 277 lb (125.6 kg) BMI (Calculated): 42.13 Weight at Last Visit: 274 lb Weight Lost Since Last Visit: 0 Weight Gained Since Last Visit: 3 lb Starting Weight: 277 lb Total Weight Loss (lbs): 0 lb (0 kg)   Body Composition  Body Fat %: 49.1 % Fat Mass (lbs): 136.4 lbs Muscle Mass (lbs): 134.2 lbs Total Body Water (lbs): 96 lbs Visceral Fat Rating : 15   Other Clinical Data Fasting: no Labs: no Today's Visit #: 51 Starting Date: 08/13/19    Chief Complaint:   OBESITY Caitlin Barrett is here to discuss her progress with her obesity treatment plan.  She is on the the Category 2 Plan and states she is following her eating plan approximately 80 % of the time.  She states she is exercising: None   Interim History:  [name] provided the following food recalll that is typical of a day: Breakfast: Oatmeal with fruit, Origins protein shake (est 10g protein) Snack: Either carrots or apple Lunch: Meat protein (grilled chicken), 2 servings of vegetables (green beans and pinto beans/lima beans) Snack: 1/2 apple or carrots or almonds Dinner: Meat protein with 2 cups of vegetables  Sleep- she reports needing to use PRN Trazodone to fall and remain asleep  Exercise-none- she has been acutely ill the last 4-6 weeks  Hydration-she estimates to drink 40-60 oz water/day  Of note- They have two pets, cats. The older cat was recently dx'd with hip dysplasia and surgical intervention is likely  Subjective:   1. Essential hypertension PCP started her on CCB in Dec 2024 She endorses bilateral lower ext edema  She reports home readings: SBP 130-140s DBP 70-80s She denies current or recent CP She does not add Na+ to food, however eats out frequently   2. B12 deficiency She is currently taking daily oral B12  She endorses fatigue, however she has been ill the last month  3. Insulin resistance When she was taking Metformin she experienced bil lower ext edema. She prefers to remain off  4. 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 She will occasionally experience lingering morning sedation  Assessment/Plan:   1. Essential hypertension Decrease Na+ Increase water intake Wear compression socks Elevated legs when able Reduce eating out  2. B12 deficiency Continue current oral supplementation  3. Insulin resistance (Primary) Increase protein at each meal, strive for at least 25-30g  4. 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 Dispense: 30 tablet, Refills: 0 ordered   6. Morbid obesity (HCC), Current BMI 42.2  [name] is currently in the action stage of change. As such, her goal is to continue with weight loss efforts. She has agreed to the Category 2 Plan.   Exercise goals: All adults should avoid inactivity. Some physical activity is better than none, and adults who participate in any amount of physical activity gain some health benefits. Adults should also include muscle-strengthening  activities that involve all major muscle groups on 2 or more days a week.  Behavioral modification strategies: increasing lean protein intake, decreasing simple carbohydrates, increasing vegetables, increasing water intake, decreasing eating out, meal planning and cooking strategies, keeping healthy foods in the home, ways to avoid boredom eating, ways  to avoid night time snacking, better snacking choices, emotional eating strategies, and planning for success.  Caitlin Barrett has agreed to follow-up with our clinic in 4 weeks. She was informed of the importance of frequent follow-up visits to maximize her success with intensive lifestyle modifications for her multiple health conditions.   Objective:   Blood pressure 130/77, pulse 71, temperature 98.3 F (36.8 C), height 5\' 8"  (1.727 m), weight 277 lb (125.6 kg), last menstrual period 05/26/2016, SpO2 98%. Body mass index is 42.12 kg/m.  General: Cooperative, alert, well developed, in no acute distress. HEENT: Conjunctivae and lids unremarkable. Cardiovascular: Regular rhythm.  Lungs: Normal work of breathing. Neurologic: No focal deficits.   Lab Results  Component Value Date   CREATININE 0.77 02/14/2023   BUN 19 02/14/2023   NA 136 02/14/2023   K 3.8 02/14/2023   CL 100 02/14/2023   CO2 26 02/14/2023   Lab Results  Component Value Date   ALT 9 11/29/2022   AST 11 11/29/2022   ALKPHOS 56 11/29/2022   BILITOT 0.2 11/29/2022   Lab Results  Component Value Date   HGBA1C 5.2 03/21/2023   HGBA1C 5.4 11/29/2022   HGBA1C 5.2 08/28/2021   HGBA1C 5.3 05/03/2021   HGBA1C 5.3 12/29/2020   Lab Results  Component Value Date   INSULIN 9.3 03/21/2023   INSULIN 17.1 11/29/2022   INSULIN 10.7 08/28/2021   INSULIN 17.5 05/03/2021   INSULIN 10.1 12/29/2020   Lab Results  Component Value Date   TSH 0.18 (L) 01/10/2023   Lab Results  Component Value Date   CHOL 212 (H) 11/29/2022   HDL 42 11/29/2022   LDLCALC 143 (H) 11/29/2022   TRIG 149 11/29/2022   CHOLHDL 5.0 (H) 11/29/2022   Lab Results  Component Value Date   VD25OH 49.2 03/21/2023   VD25OH 70 01/10/2023   VD25OH 65.4 11/29/2022   Lab Results  Component Value Date   WBC 10.5 02/14/2023   HGB 13.3 02/14/2023   HCT 39.6 02/14/2023   MCV 90.6 02/14/2023   PLT 401 (H) 02/14/2023   Lab Results  Component Value Date    IRON 64 08/13/2019   TIBC 333 08/13/2019   FERRITIN 30 08/13/2019    Attestation Statements:   Reviewed by clinician on day of visit: allergies, medications, problem list, medical history, surgical history, family history, social history, and previous encounter notes.  I have reviewed the above documentation for accuracy and completeness, and I agree with the above. -  Oriel Rumbold d. Donnis Pecha, NP-C

## 2023-05-15 ENCOUNTER — Ambulatory Visit
Admission: RE | Admit: 2023-05-15 | Discharge: 2023-05-15 | Disposition: A | Discharge status: Home / Self Care | Payer: Self-pay | Source: Ambulatory Visit | Attending: Family Medicine | Admitting: Family Medicine

## 2023-05-15 DIAGNOSIS — I10 Essential (primary) hypertension: Secondary | ICD-10-CM | POA: Insufficient documentation

## 2023-05-16 ENCOUNTER — Encounter: Payer: Self-pay | Admitting: Family Medicine

## 2023-05-20 ENCOUNTER — Other Ambulatory Visit: Payer: Self-pay | Admitting: Family Medicine

## 2023-05-20 DIAGNOSIS — G43909 Migraine, unspecified, not intractable, without status migrainosus: Secondary | ICD-10-CM

## 2023-05-21 NOTE — Telephone Encounter (Signed)
 Requested Prescriptions  Pending Prescriptions Disp Refills   rizatriptan (MAXALT-MLT) 10 MG disintegrating tablet [Pharmacy Med Name: RIZATRIPTAN 10 MG ODT] 10 tablet 1    Sig: TAKE 1 TABLET BY MOUTH AS NEEDED FOR MIGRAINE. MAY REPEAT IN 2 HOURS IF NEEDED. MAX DOSE IS 2 TABS IN 24 HOURS.     Neurology:  Migraine Therapy - Triptan Passed - 05/21/2023  4:10 PM      Passed - Last BP in normal range    BP Readings from Last 1 Encounters:  05/14/23 130/77         Passed - Valid encounter within last 12 months    Recent Outpatient Visits           2 months ago Essential hypertension   [place] [facility] Smitty Cords, DO   8 months ago Near syncope   [place] [facility] Smitty Cords, DO   1 year ago Neck pain   [facility] Smitty Cords, DO   2 years ago Chronic right hip pain   [place] [facility] Smitty Cords, DO   3 years ago Acute right-sided low back pain without sciatica   [facility] Smitty Cords, DO       Future Appointments             In 2 months Pollyann Savoy, MD [facility] - A Dept Of [place]   In 3 months Althea Charon, Netta Neat, DO [facility] [place]

## 2023-05-30 ENCOUNTER — Other Ambulatory Visit (INDEPENDENT_AMBULATORY_CARE_PROVIDER_SITE_OTHER): Payer: Self-pay | Admitting: Adult Health

## 2023-05-30 DIAGNOSIS — E559 Vitamin D deficiency, unspecified: Secondary | ICD-10-CM

## 2023-06-04 ENCOUNTER — Encounter: Payer: Self-pay | Admitting: Emergency Medicine

## 2023-06-04 ENCOUNTER — Other Ambulatory Visit: Payer: Self-pay

## 2023-06-04 ENCOUNTER — Ambulatory Visit
Admission: EM | Admit: 2023-06-04 | Discharge: 2023-06-04 | Disposition: A | Discharge status: Home / Self Care | Attending: Emergency Medicine | Admitting: Emergency Medicine

## 2023-06-04 DIAGNOSIS — J069 Acute upper respiratory infection, unspecified: Secondary | ICD-10-CM | POA: Diagnosis not present

## 2023-06-04 MED ORDER — AMOXICILLIN-POT CLAVULANATE 875-125 MG PO TABS: 1.0000 | ORAL_TABLET | Freq: Two times a day (BID) | ORAL | 0 refills | Status: DC

## 2023-06-04 MED ORDER — AMOXICILLIN 500 MG PO CAPS: 500.0000 mg | ORAL_CAPSULE | Freq: Two times a day (BID) | ORAL | 0 refills | Status: DC

## 2023-06-04 MED ORDER — FLUCONAZOLE 150 MG PO TABS: 150.0000 mg | ORAL_TABLET | Freq: Every day | ORAL | 0 refills | Status: AC

## 2023-06-04 MED ORDER — BENZONATATE 100 MG PO CAPS: 100.0000 mg | ORAL_CAPSULE | Freq: Three times a day (TID) | ORAL | 0 refills | Status: DC

## 2023-06-04 MED ORDER — ALBUTEROL SULFATE HFA 108 (90 BASE) MCG/ACT IN AERS: 2.0000 | INHALATION_SPRAY | RESPIRATORY_TRACT | 1 refills | Status: AC | PRN

## 2023-06-04 NOTE — ED Provider Notes (Signed)
 Caitlin Barrett    CSN: [id number] Arrival date & time: 06/04/23  1853      History   Chief Complaint Chief Complaint  Patient presents with   Cough    HPI Caitlin Barrett is a 53 y.o. female.   Patient presents for evaluation of nasal congestion, nonproductive cough, shortness of breath with coughing, wheezing, intermittent headaches, scratchy throat present for 1 month.  Symptoms worsening over the past week with a cough becoming more prominent and persistent as well as increased fatigue.  Symptoms interfering with sleep.  Has attempted use of ibuprofen.  Tolerating food and liquids.  Known sick contact in household, recently diagnosed with bronchitis.  Past Medical History:  Diagnosis Date   Allergy    seasonal.  Takes allergy shots at [facility]   Anemia    Anxiety    Arthritis    Asthma    exercise induced--no inhalers   Back pain    Cancer (HCC) 2010   Basal cell skin cancer Lt eye, skin graft above Lt eye   Constipation    Costochondritis    Depression    Dysmenorrhea    Eczema    Edema, lower extremity    GERD (gastroesophageal reflux disease)    Headache    Hypothyroid    Nodules and Fine needle aspiration   IBS (irritable bowel syndrome)    Lab test positive for detection of COVID-19 virus    November 2020   Myalgia    Osteoarthritis    Painful menstrual periods    Palpitations    Plantar fasciitis    Shortness of breath    Sprain of carpal (joint) of wrist    Vitamin B 12 deficiency    Vitamin D deficiency     Patient Active Problem List   Diagnosis Date Noted   BMI 39.0-39.9,adult-current bmi 39.4 06/20/2022   Morbid obesity (HCC)-start bmi 40.91 06/20/2022   Depression 08/11/2021   Bilateral lower extremity edema 08/06/2021   Status post nasal septoplasty 03/30/2021   Colon cancer screening    Polyp of colon    Depressed mood 12/01/2020   Nausea without vomiting 11/07/2020   Thyroid disease 06/09/2020   Cervical strain  04/25/2020   Fatigue 04/04/2020   GAD (generalized anxiety disorder) 02/08/2020   Pericardial pain 01/28/2020   Muscle soreness 01/28/2020   Mixed hyperlipidemia 11/25/2019   Essential hypertension 11/25/2019   Polyphagia 10/08/2019   Vitamin D deficiency 09/17/2019   Insulin resistance 09/17/2019   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   Class 3 severe obesity with serious comorbidity and body mass index (BMI) of 40.0 to 44.9 in adult [facility]) 09/17/2019   Exercise-induced asthma 04/22/2018   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   Monilial vaginitis 02/26/2017   Decreased libido 02/26/2017   Major depressive disorder, recurrent, in full remission (HCC) 02/26/2017   Dyspareunia, female 02/26/2017   S/P TAH (total abdominal hysterectomy) 07/02/2016   Nevus of abdominal wall 06/11/2016   Morton's neuralgia 07/21/2015   Nocturia 07/21/2015   Stress incontinence 07/21/2015   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   Allergic rhinitis 06/21/2015   Obesity (BMI 30-39.9) 06/21/2015    Past Surgical History:  Procedure Laterality Date   ABDOMINAL HYSTERECTOMY Bilateral 07/02/2016   Procedure: HYSTERECTOMY ABDOMINAL WITH BILATERAL SALPINGECTOMY-MIDLINE INCISION;  Surgeon: Herold Harms, MD;  Location: [facility] ORS;  Service: Gynecology;  Laterality: Bilateral;   CHOLECYSTECTOMY  2009   COLONOSCOPY WITH PROPOFOL N/A 01/26/2021   Procedure: COLONOSCOPY WITH PROPOFOL;  Surgeon:  Toney Reil, MD;  Location: [facility] ENDOSCOPY;  Service: Gastroenterology;  Laterality: N/A;   fibromalga     HERNIA REPAIR  1974   umbilical   NASAL SEPTOPLASTY W/ TURBINOPLASTY Bilateral 03/03/2021   Procedure: NASAL SEPTOPLASTY WITH SUBMUCOSAL RESECTION OF TURBINATE;  Surgeon: Linus Salmons, MD;  Location[facility];  Service: ENT;  Laterality: Bilateral;   SKIN GRAFT  2010   TONSILLECTOMY      OB History     Gravida  0   Para  0   Term  0   Preterm  0   AB  0   Living  0      SAB  0    IAB  0   Ectopic  0   Multiple  0   Live Births  0            Home Medications    Prior to Admission medications   Medication Sig Start Date End Date Taking? Authorizing Provider  albuterol (VENTOLIN HFA) 108 (90 Base) MCG/ACT inhaler Inhale 2 puffs into the lungs every 4 (four) hours as needed for wheezing or shortness of breath. 06/04/23  Yes Mackinzee Roszak R, NP  amoxicillin (AMOXIL) 500 MG capsule Take 1 capsule (500 mg total) by mouth 2 (two) times daily for 10 days. 06/04/23 06/14/23 Yes Brandalynn Ofallon R, NP  benzonatate (TESSALON) 100 MG capsule Take 1 capsule (100 mg total) by mouth every 8 (eight) hours. 06/04/23  Yes Chandlar Staebell R, NP  amLODipine (NORVASC) 5 MG tablet Take 1 tablet (5 mg total) by mouth daily. 03/12/23 03/11/24  Karamalegos, Netta Neat, DO  clobetasol cream (TEMOVATE) 0.05 % Apply 1 application topically 2 (two) times daily. For up to 1-2 weeks then as needed. Do not use on sensitive skin. 04/24/19   Karamalegos, Netta Neat, DO  COLLAGEN-BORON-HYALURONIC ACID PO Take by mouth.    [provider]  cyanocobalamin (VITAMIN B12) 500 MCG tablet Take 1 tablet (500 mcg total) by mouth daily. 04/16/23   Danford, Orpha Bur D, NP  cyclobenzaprine (FLEXERIL) 10 MG tablet Take 1 tablet (10 mg total) by mouth at bedtime as needed for muscle spasms. 08/25/22   Katha Cabal, DO  DULoxetine HCl 40 MG CPEP Take 1 capsule (40 mg total) by mouth daily. 04/16/23   Danford, Orpha Bur D, NP  EPINEPHrine 0.3 mg/0.3 mL IJ SOAJ injection 0.3 mg as needed. 09/25/08   [provider]  estradiol (ESTRACE) 1 MG tablet Take 1 tablet (1 mg total) by mouth daily. 08/07/22   Hildred Laser, MD  fluticasone (FLONASE) 50 MCG/ACT nasal spray Place into both nostrils as needed. 05/17/21   [provider]  loratadine (CLARITIN) 10 MG tablet Take 10 mg by mouth daily as needed. 10/13/20   [provider]  metroNIDAZOLE (FLAGYL) 500 MG tablet Take 1 tablet (500 mg total) by  mouth 2 (two) times daily. 05/13/23   Becky Augusta, NP  Misc Natural Products (OSTEO BI-FLEX ADV TRIPLE ST PO) Take by mouth.    [provider]  Multiple Vitamins-Minerals (MULTIVITAMIN WOMEN PO) Take by mouth.    [provider]  nystatin cream (MYCOSTATIN) Apply to affected area 2 times daily 05/13/23   Becky Augusta, NP  omeprazole (PRILOSEC) 40 MG capsule Take 40 mg by mouth as needed.    [provider]  ondansetron (ZOFRAN) 8 MG tablet Take 1 tablet (8 mg total) by mouth every 8 (eight) hours as needed for nausea or vomiting. 04/16/23  William Hamburger D, NP  Probiotic Product (TRUBIOTICS) CAPS Take 1 capsule by mouth daily.    [provider]  progesterone (PROMETRIUM) 200 MG capsule TAKE 1 CAPSULE BY MOUTH EVERY DAY 04/19/23   Hildred Laser, MD  rizatriptan (MAXALT-MLT) 10 MG disintegrating tablet TAKE 1 TABLET BY MOUTH AS NEEDED FOR MIGRAINE. MAY REPEAT IN 2 HOURS IF NEEDED. MAX DOSE IS 2 TABS IN 24 HOURS. 05/21/23   Althea Charon, Netta Neat, DO  traZODone (DESYREL) 50 MG tablet Take 0.5-1 tablets (25-50 mg total) by mouth at bedtime as needed for sleep. 05/14/23   Danford, Orpha Bur D, NP  triamcinolone cream (KENALOG) 0.1 % APPLY THIN FILM TWICE DAILY TO ECZEMA UNTIL CLEAR 12/23/18   [provider]  TURMERIC PO Take by mouth daily.    [provider]  VITAMIN D PO Take 1,000 Units by mouth. 6 days weekly    [provider]  Vitamin D, Ergocalciferol, (DRISDOL) 1.25 MG (50000 UNIT) CAPS capsule TAKE ONE CAPSULE BY MOUTH EVERY 14 DAYS 05/14/23   Danford, Jinny Blossom, NP    Family History Family History  Problem Relation Age of Onset   Diabetes Mother    Hyperlipidemia Mother    Hypertension Mother    Sleep apnea Mother    Obesity Mother    Cancer Father        lung CA   Heart disease Maternal Grandmother    Stroke Maternal Grandmother    Hypertension Maternal Grandmother    Diabetes Maternal Grandmother    Heart disease Maternal  Grandfather    Diabetes Maternal Grandfather    Heart attack Maternal Grandfather    Breast cancer Neg Hx     Social History Social History   Tobacco Use   Smoking status: Never    Passive exposure: Past   Smokeless tobacco: Never  Vaping Use   Vaping status: Never Used  Substance Use Topics   Alcohol use: Yes    Comment: rare   Drug use: No     Allergies   Iodinated contrast media and Codeine   Review of Systems Review of Systems   Physical Exam Triage Vital Signs ED Triage Vitals  Encounter Vitals Group     BP 06/04/23 1906 (!) 141/79     Systolic BP Percentile --      Diastolic BP Percentile --      Pulse Rate 06/04/23 1906 76     Resp 06/04/23 1906 20     Temp --      Temp src --      SpO2 06/04/23 1906 97 %     Weight --      Height --      Head Circumference --      Peak Flow --      Pain Score 06/04/23 1907 0     Pain Loc --      Pain Education --      Exclude from Growth Chart --    No data found.  Updated Vital Signs BP (!) 141/79 (BP Location: Left Arm)   Pulse 76   Resp 20   LMP 05/26/2016 (Exact Date)   SpO2 97%   Visual Acuity Right Eye Distance:   Left Eye Distance:   Bilateral Distance:    Right Eye Near:   Left Eye Near:    Bilateral Near:     Physical Exam Constitutional:      Appearance: She is ill-appearing.  HENT:     Head: Normocephalic.  Right Ear: Tympanic membrane, ear canal and external ear normal.     Left Ear: Tympanic membrane, ear canal and external ear normal.     Nose: Congestion present.     Mouth/Throat:     Mouth: Mucous membranes are moist.     Pharynx: Oropharynx is clear. No oropharyngeal exudate or posterior oropharyngeal erythema.  Eyes:     Extraocular Movements: Extraocular movements intact.  Cardiovascular:     Rate and Rhythm: Normal rate and regular rhythm.     Pulses: Normal pulses.     Heart sounds: Normal heart sounds.  Pulmonary:     Effort: Pulmonary effort is normal.     Breath  sounds: Normal breath sounds.  Musculoskeletal:     Cervical back: Normal range of motion and neck supple.  Neurological:     Mental Status: She is alert and oriented to person, place, and time. Mental status is at baseline.      UC Treatments / Results  Labs (all labs ordered are listed, but only abnormal results are displayed) Labs Reviewed - No data to display  EKG   Radiology No results found.  Procedures Procedures (including critical care time)  Medications Ordered in UC Medications - No data to display  Initial Impression / Assessment and Plan / UC Course  I have reviewed the triage vital signs and the nursing notes.  Pertinent labs & imaging results that were available during my care of the patient were reviewed by me and considered in my medical decision making (see chart for details).  Acute URI  Patient is in no signs of distress nor toxic appearing.  Vital signs are stable.  Low suspicion for pneumonia, pneumothorax or bronchitis and therefore will defer imaging.  Viral testing deferred due to timeline of illness.  Prescribed amoxicillin, albuterol inhaler and Tessalon, declined prescription for prednisone.May use additional over-the-counter medications as needed for supportive care.  May follow-up with urgent care as needed if symptoms persist or worsen.    Final Clinical Impressions(s) / UC Diagnoses   Final diagnoses:  Acute URI     Discharge Instructions      Begin  amoxicillin every morning and every evening for 10 days to provide coverage for bacteria, do not pick up Augmentin  May use inhaler taking 2 puffs every 4 hours for wheezing and shortness of breath  May use Tessalon pill every 8 hours as needed for cough  You can take Tylenol and/or Ibuprofen as needed for fever reduction and pain relief.   For cough: honey 1/2 to 1 teaspoon (you can dilute the honey in water or another fluid).  You can also use guaifenesin and dextromethorphan for  cough. You can use a humidifier for chest congestion and cough.  If you don't have a humidifier, you can sit in the bathroom with the hot shower running.      For sore throat: try warm salt water gargles, cepacol lozenges, throat spray, warm tea or water with lemon/honey, popsicles or ice, or OTC cold relief medicine for throat discomfort.   For congestion: take a daily anti-histamine like Zyrtec, Claritin, and a oral decongestant, such as pseudoephedrine.  You can also use Flonase 1-2 sprays in each nostril daily.   It is important to stay hydrated: drink plenty of fluids (water, gatorade/powerade/pedialyte, juices, or teas) to keep your throat moisturized and help further relieve irritation/discomfort.    ED Prescriptions     Medication Sig Dispense Auth. Provider   amoxicillin-clavulanate (AUGMENTIN) 875-125  MG tablet  (Status: Discontinued) Take 1 tablet by mouth every 12 (twelve) hours. 14 tablet Pamela Maddy R, NP   albuterol (VENTOLIN HFA) 108 (90 Base) MCG/ACT inhaler Inhale 2 puffs into the lungs every 4 (four) hours as needed for wheezing or shortness of breath. 8.5 g Corabelle Spackman R, NP   benzonatate (TESSALON) 100 MG capsule Take 1 capsule (100 mg total) by mouth every 8 (eight) hours. 21 capsule Nayely Dingus R, NP   amoxicillin (AMOXIL) 500 MG capsule Take 1 capsule (500 mg total) by mouth 2 (two) times daily for 10 days. 20 capsule Valinda Hoar, NP      PDMP not reviewed this encounter.   Valinda Hoar, NP 06/04/23 1942

## 2023-06-04 NOTE — Discharge Instructions (Addendum)
 Begin  amoxicillin every morning and every evening for 10 days to provide coverage for bacteria, do not pick up Augmentin  May use inhaler taking 2 puffs every 4 hours for wheezing and shortness of breath  May use Tessalon pill every 8 hours as needed for cough  You can take Tylenol and/or Ibuprofen as needed for fever reduction and pain relief.   For cough: honey 1/2 to 1 teaspoon (you can dilute the honey in water or another fluid).  You can also use guaifenesin and dextromethorphan for cough. You can use a humidifier for chest congestion and cough.  If you don't have a humidifier, you can sit in the bathroom with the hot shower running.      For sore throat: try warm salt water gargles, cepacol lozenges, throat spray, warm tea or water with lemon/honey, popsicles or ice, or OTC cold relief medicine for throat discomfort.   For congestion: take a daily anti-histamine like Zyrtec, Claritin, and a oral decongestant, such as pseudoephedrine.  You can also use Flonase 1-2 sprays in each nostril daily.   It is important to stay hydrated: drink plenty of fluids (water, gatorade/powerade/pedialyte, juices, or teas) to keep your throat moisturized and help further relieve irritation/discomfort.

## 2023-06-04 NOTE — ED Triage Notes (Signed)
 Patient presents to [facility] for evaluation of cough x 1 month, was improving after a viral illness, but in the last week seems to be getting worse.

## 2023-06-10 ENCOUNTER — Ambulatory Visit (INDEPENDENT_AMBULATORY_CARE_PROVIDER_SITE_OTHER): Payer: Commercial Managed Care - PPO | Admitting: Adult Health

## 2023-06-10 ENCOUNTER — Encounter (INDEPENDENT_AMBULATORY_CARE_PROVIDER_SITE_OTHER): Payer: Self-pay | Admitting: Adult Health

## 2023-06-10 VITALS — BP 138/84 | HR 85 | Temp 98.0°F | Ht 68.0 in | Wt 279.0 lb

## 2023-06-10 DIAGNOSIS — I10 Essential (primary) hypertension: Secondary | ICD-10-CM

## 2023-06-10 DIAGNOSIS — E559 Vitamin D deficiency, unspecified: Secondary | ICD-10-CM

## 2023-06-10 DIAGNOSIS — E88819 Insulin resistance, unspecified: Secondary | ICD-10-CM | POA: Diagnosis not present

## 2023-06-10 DIAGNOSIS — G4700 Insomnia, unspecified: Secondary | ICD-10-CM | POA: Diagnosis not present

## 2023-06-10 DIAGNOSIS — F329 Major depressive disorder, single episode, unspecified: Secondary | ICD-10-CM

## 2023-06-10 DIAGNOSIS — F99 Mental disorder, not otherwise specified: Secondary | ICD-10-CM

## 2023-06-10 DIAGNOSIS — F5105 Insomnia due to other mental disorder: Secondary | ICD-10-CM

## 2023-06-10 DIAGNOSIS — E538 Deficiency of other specified B group vitamins: Secondary | ICD-10-CM | POA: Diagnosis not present

## 2023-06-10 DIAGNOSIS — Z6841 Body Mass Index (BMI) 40.0 and over, adult: Secondary | ICD-10-CM

## 2023-06-10 MED ORDER — TRAZODONE HCL 50 MG PO TABS: 25.0000 mg | ORAL_TABLET | Freq: Every evening | ORAL | 0 refills | Status: DC | PRN

## 2023-06-10 MED ORDER — VITAMIN D (ERGOCALCIFEROL) 1.25 MG (50000 UNIT) PO CAPS: ORAL_CAPSULE | ORAL | 0 refills | Status: DC

## 2023-06-10 MED ORDER — DULOXETINE HCL 40 MG PO CPEP: 40.0000 mg | ORAL_CAPSULE | Freq: Every day | ORAL | 0 refills | Status: DC

## 2023-06-10 NOTE — Progress Notes (Signed)
 WEIGHT SUMMARY AND BIOMETRICS  Vitals Temp: 98 F (36.7 C) BP: 138/84 Pulse Rate: 85 SpO2: 96 %   Anthropometric Measurements Height: 5\' 8"  (1.727 m) Weight: 279 lb (126.6 kg) BMI (Calculated): 42.43 Weight at Last Visit: 277 lb Weight Lost Since Last Visit: 0 lb Weight Gained Since Last Visit: 2 lb Starting Weight: 277 lb Total Weight Loss (lbs): 0 lb (0 kg)   Body Composition  Body Fat %: 49.7 % Fat Mass (lbs): 138.8 lbs Muscle Mass (lbs): 133.6 lbs Total Body Water (lbs): 99.6 lbs Visceral Fat Rating : 15   Other Clinical Data Fasting: no Labs: no Today's Visit #: 63 Starting Date: 08/13/19    Chief Complaint:   OBESITY Caitlin Barrett is here to discuss her progress with her obesity treatment plan.  She is on the the Category 2 Plan and states she is following her eating plan approximately 85 % of the time.  She states she is exercising Walking/Strength Training 30/20 minutes 2/1 times per week.   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  06/05/2023 Endocrinology sent in [facility] She has yet to learn if Rx was approved by insurance  Hydration-she estimates to drink at least 8 glasses water/day  Subjective:   1. Essential hypertension BP above goal at OV She denies CP with  exertion  2. B12 deficiency 04/16/2023- Started once daily oral B12 She has been taking consistently daily, denies increase in appreciable energy levels  3. Insulin resistance She is Metformin intolerant, re: Bilateral lower extremity edema Caitlin Barrett experiences bilateral lower extremity edema, worsening throughout the day.  She believes this began after Metformin started. She stopped Metformin therapy May 2023  Her Endocrinoloigst recently sent in Rx for [facility], she has yet to hear if it was approved. Discussed  risks/benefits of Wegovy, Zepbound therapy  4. Vitamin D deficiency  Latest Reference Range & Units 11/29/22 09:48 01/10/23 15:49 03/21/23 11:42  Vitamin D, 25-Hydroxy 30.0 - 100.0 ng/mL 65.4 70 49.2   5. Insomnia PDMP reviewed- no aberrancies noted She is able to fall asleep well, however endorses early waking each night 0200-0300 She is using PRN Trazodone 50mg  1/2 to 1 tab at bedtime She denies morning sedation  Assessment/Plan:   1. Essential hypertension (Primary) Limit Na+ Remain active Continue amLODipine (NORVASC) 5 MG tablet   2. B12 deficiency Continue daily oral supplementation  3. Insulin resistance Continue healthy eating and remain active Start [facility] per Endocrinology  4. Vitamin D deficiency Refill Vitamin D, Ergocalciferol, (DRISDOL) 1.25 MG (50000 UNIT) CAPS capsule TAKE ONE CAPSULE BY MOUTH EVERY 14 DAYS Dispense: 8 capsule, Refills: 0 ordered   5. Insomnia Refill traZODone (DESYREL) 50 MG tablet Take 0.5-1 tablets (25-50 mg total) by mouth at bedtime as needed for sleep. Dispense: 30 tablet, Refills: 0 ordered   6. Morbid obesity (HCC), Current BMI 42.2  Caitlin Barrett is currently in the action stage of change. As such, her goal is to continue with weight loss efforts. She has agreed to the Category 2 Plan.   Exercise goals: All adults should avoid inactivity. Some physical activity is better than none, and adults who participate in any amount of physical activity gain some health benefits. Adults should also include muscle-strengthening activities that involve all major muscle groups on 2 or more days a week.  Behavioral modification strategies:  increasing lean protein intake, decreasing simple carbohydrates, increasing vegetables, increasing water intake, decreasing eating out, no skipping meals, meal planning and cooking strategies, keeping healthy foods in the home, and planning for success.  Caitlin Barrett has agreed to follow-up with our clinic in 4 weeks. She  was informed of the importance of frequent follow-up visits to maximize her success with intensive lifestyle modifications for her multiple health conditions.   Objective:   Blood pressure 138/84, pulse 85, temperature 98 F (36.7 C), height 5\' 8"  (1.727 m), weight 279 lb (126.6 kg), last menstrual period 05/26/2016, SpO2 96%. Body mass index is 42.42 kg/m.  General: Cooperative, alert, well developed, in no acute distress. HEENT: Conjunctivae and lids unremarkable. Cardiovascular: Regular rhythm.  Lungs: Normal work of breathing. Neurologic: No focal deficits.   Lab Results  Component Value Date   CREATININE 0.77 02/14/2023   BUN 19 02/14/2023   NA 136 02/14/2023   K 3.8 02/14/2023   CL 100 02/14/2023   CO2 26 02/14/2023   Lab Results  Component Value Date   ALT 9 11/29/2022   AST 11 11/29/2022   ALKPHOS 56 11/29/2022   BILITOT 0.2 11/29/2022   Lab Results  Component Value Date   HGBA1C 5.2 03/21/2023   HGBA1C 5.4 11/29/2022   HGBA1C 5.2 08/28/2021   HGBA1C 5.3 05/03/2021   HGBA1C 5.3 12/29/2020   Lab Results  Component Value Date   INSULIN 9.3 03/21/2023   INSULIN 17.1 11/29/2022   INSULIN 10.7 08/28/2021   INSULIN 17.5 05/03/2021   INSULIN 10.1 12/29/2020   Lab Results  Component Value Date   TSH 0.18 (L) 01/10/2023   Lab Results  Component Value Date   CHOL 212 (H) 11/29/2022   HDL 42 11/29/2022   LDLCALC 143 (H) 11/29/2022   TRIG 149 11/29/2022   CHOLHDL 5.0 (H) 11/29/2022   Lab Results  Component Value Date   VD25OH 49.2 03/21/2023   VD25OH 70 01/10/2023   VD25OH 65.4 11/29/2022   Lab Results  Component Value Date   WBC 10.5 02/14/2023   HGB 13.3 02/14/2023   HCT 39.6 02/14/2023   MCV 90.6 02/14/2023   PLT 401 (H) 02/14/2023   Lab Results  Component Value Date   IRON 64 08/13/2019   TIBC 333 08/13/2019   FERRITIN 30 08/13/2019   Attestation Statements:   Reviewed by clinician on day of visit: allergies, medications, problem list,  medical history, surgical history, family history, social history, and previous encounter notes.  I have reviewed the above documentation for accuracy and completeness, and I agree with the above. -  Natia Fahmy d. Caitlin Riling, NP-C

## 2023-06-12 ENCOUNTER — Ambulatory Visit
Admission: RE | Admit: 2023-06-12 | Discharge: 2023-06-12 | Disposition: A | Discharge status: Home / Self Care | Source: Ambulatory Visit | Attending: Family Medicine | Admitting: Family Medicine

## 2023-06-12 VITALS — BP 136/80 | HR 87 | Temp 98.3°F | Resp 18

## 2023-06-12 DIAGNOSIS — M79662 Pain in left lower leg: Secondary | ICD-10-CM

## 2023-06-12 DIAGNOSIS — M79605 Pain in left leg: Secondary | ICD-10-CM | POA: Insufficient documentation

## 2023-06-12 DIAGNOSIS — L988 Other specified disorders of the skin and subcutaneous tissue: Secondary | ICD-10-CM

## 2023-06-12 DIAGNOSIS — M7989 Other specified soft tissue disorders: Secondary | ICD-10-CM | POA: Diagnosis present

## 2023-06-12 DIAGNOSIS — R21 Rash and other nonspecific skin eruption: Secondary | ICD-10-CM | POA: Diagnosis not present

## 2023-06-12 MED ORDER — CLOBETASOL PROPIONATE 0.05 % EX CREA: 1.0000 | TOPICAL_CREAM | Freq: Two times a day (BID) | CUTANEOUS | 0 refills | Status: AC

## 2023-06-12 NOTE — ED Provider Notes (Signed)
 [facility]    CSN: [id number] Arrival date & time: 06/12/23  1437      History   Chief Complaint Chief Complaint  Patient presents with   Leg Pain    Pain in left leg (shin) with a bit of redness - Entered by patient   Foot Pain    HPI  HPI Caitlin Barrett is a 53 y.o. female.   [name] presents for constant "dull soreness" left lower leg pain that started Sunday evening. She has some redness to her lower leg. No has not have any previous similar sx. She has been taking hormone replacement medication with estrogen since 2018.  No history of blood clots in the legs or lungs. She drove to [employer] at the beginning of March.  She started having more leg swelling on the left than the right. Takes amlodipine. Taking ibuprofen for pain which  is helping a little bit.   No recent trauma, falls or injuries.  She hit her leg against a dresser in December. It took a while for the bruising to go away.      Past Medical History:  Diagnosis Date   Allergy    seasonal.  Takes allergy shots at [facility]   Anemia    Anxiety    Arthritis    Asthma    exercise induced--no inhalers   Back pain    Cancer (HCC) 2010   Basal cell skin cancer Lt eye, skin graft above Lt eye   Constipation    Costochondritis    Depression    Dysmenorrhea    Eczema    Edema, lower extremity    GERD (gastroesophageal reflux disease)    Headache    Hypothyroid    Nodules and Fine needle aspiration   IBS (irritable bowel syndrome)    Lab test positive for detection of COVID-19 virus    November 2020   Myalgia    Osteoarthritis    Painful menstrual periods    Palpitations    Plantar fasciitis    Shortness of breath    Sprain of carpal (joint) of wrist    Vitamin B 12 deficiency    Vitamin D deficiency     Patient Active Problem List   Diagnosis Date Noted   BMI 39.0-39.9,adult-current bmi 39.4 06/20/2022   Morbid obesity (HCC)-start bmi 40.91 06/20/2022   Depression  08/11/2021   Bilateral lower extremity edema 08/06/2021   Status post nasal septoplasty 03/30/2021   Colon cancer screening    Polyp of colon    Depressed mood 12/01/2020   Nausea without vomiting 11/07/2020   Thyroid disease 06/09/2020   Cervical strain 04/25/2020   Fatigue 04/04/2020   GAD (generalized anxiety disorder) 02/08/2020   Pericardial pain 01/28/2020   Muscle soreness 01/28/2020   Mixed hyperlipidemia 11/25/2019   Essential hypertension 11/25/2019   Polyphagia 10/08/2019   Vitamin D deficiency 09/17/2019   Insulin resistance 09/17/2019   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   Allergic rhinitis 06/21/2015   Obesity (BMI 30-39.9) 06/21/2015    Past Surgical History:  Procedure Laterality Date   ABDOMINAL HYSTERECTOMY Bilateral 07/02/2016   Procedure: HYSTERECTOMY ABDOMINAL WITH BILATERAL SALPINGECTOMY-MIDLINE INCISION;  Surgeon: Herold Harms, MD;  Location: [facility] ORS;  Service: Gynecology;  Laterality: Bilateral;   CHOLECYSTECTOMY  2009   COLONOSCOPY WITH PROPOFOL N/A 01/26/2021   Procedure: COLONOSCOPY WITH PROPOFOL;  Surgeon: Toney Reil, MD;  Location: [facility] ENDOSCOPY;  Service: Gastroenterology;  Laterality: N/A;   fibromalga     HERNIA REPAIR  1974   umbilical   NASAL SEPTOPLASTY W/ TURBINOPLASTY Bilateral 03/03/2021   Procedure: NASAL SEPTOPLASTY WITH SUBMUCOSAL RESECTION OF  TURBINATE;  Surgeon: Linus Salmons, MD;  Location: [facility];  Service: ENT;  Laterality: Bilateral;   SKIN GRAFT  2010   TONSILLECTOMY      OB History     Gravida  0   Para  0   Term  0   Preterm  0   AB  0   Living  0      SAB  0   IAB  0   Ectopic  0   Multiple  0   Live Births  0            Home Medications    Prior to Admission medications   Medication Sig Start Date End Date Taking? Authorizing Provider  albuterol (VENTOLIN HFA) 108 (90 Base) MCG/ACT inhaler Inhale 2 puffs into the lungs every 4 (four) hours as needed for wheezing or shortness of breath. 06/04/23  Yes White, Adrienne R, NP  amLODipine (NORVASC) 5 MG tablet Take 1 tablet (5 mg total) by mouth daily. 03/12/23 03/11/24 Yes Karamalegos, Netta Neat, DO  benzonatate (TESSALON) 100 MG capsule Take 1 capsule (100 mg total) by mouth every 8 (eight) hours. 06/04/23  Yes White, Adrienne R, NP  COLLAGEN-BORON-HYALURONIC ACID PO Take by mouth.   Yes [provider]  cyanocobalamin (VITAMIN B12) 500 MCG tablet Take 1 tablet (500 mcg total) by mouth daily. 04/16/23  Yes Danford, Orpha Bur D, NP  DULoxetine HCl 40 MG CPEP Take 1 capsule (40 mg total) by mouth daily. 06/10/23  Yes Danford, Orpha Bur D, NP  estradiol (ESTRACE) 1 MG tablet Take 1 tablet (1 mg total) by mouth daily. 08/07/22  Yes Hildred Laser, MD  fluticasone (FLONASE) 50 MCG/ACT nasal spray Place into both nostrils as needed. 05/17/21  Yes [provider]  loratadine (CLARITIN) 10 MG tablet Take 10 mg by mouth daily as needed. 10/13/20  Yes [provider]  Misc Natural Products (OSTEO BI-FLEX ADV TRIPLE ST PO) Take by mouth.   Yes [provider]  Multiple Vitamins-Minerals (MULTIVITAMIN WOMEN PO) Take by mouth.   Yes [provider]  Probiotic Product (TRUBIOTICS) CAPS Take 1 capsule by mouth daily.   Yes [provider]  progesterone (PROMETRIUM) 200 MG capsule TAKE 1 CAPSULE BY MOUTH  EVERY DAY 04/19/23  Yes Hildred Laser, MD  traZODone (DESYREL) 50 MG tablet Take 0.5-1 tablets (25-50 mg total) by mouth at bedtime as needed for sleep. 06/10/23  Yes Danford, Orpha Bur D, NP  TURMERIC PO Take by mouth daily.   Yes [provider]  VITAMIN D PO Take 1,000 Units by mouth. 6 days weekly   Yes [provider]  Vitamin D, Ergocalciferol, (DRISDOL) 1.25 MG (50000 UNIT) CAPS capsule TAKE ONE CAPSULE BY MOUTH EVERY 14 DAYS 06/10/23  Yes Danford, Jinny Blossom, NP  clobetasol cream (TEMOVATE) 0.05 % Apply 1 Application topically 2 (two) times daily. For up to 1-2 weeks then as needed. 06/12/23   Katha Cabal, DO  cyclobenzaprine (FLEXERIL) 10 MG tablet Take 1 tablet (10 mg total) by mouth at bedtime as needed for muscle spasms. 08/25/22   Humberto Addo, Seward Meth, DO  EPINEPHrine 0.3 mg/0.3 mL IJ SOAJ injection 0.3 mg as needed. 09/25/08   [provider]  metroNIDAZOLE (FLAGYL) 500 MG tablet Take 1 tablet (500 mg total) by mouth 2 (two) times daily. 05/13/23   Becky Augusta, NP  nystatin cream (MYCOSTATIN) Apply to affected area 2 times daily 05/13/23   Becky Augusta, NP  omeprazole (PRILOSEC) 40 MG capsule Take 40 mg by mouth as needed.    [provider]  ondansetron (ZOFRAN) 8 MG tablet Take 1 tablet (8 mg total) by mouth every 8 (eight) hours as needed for nausea or vomiting. 04/16/23   Danford, Orpha Bur D, NP  rizatriptan (MAXALT-MLT) 10 MG disintegrating tablet TAKE 1 TABLET BY MOUTH AS NEEDED FOR MIGRAINE. MAY REPEAT IN 2 HOURS IF NEEDED. MAX DOSE IS 2 TABS IN 24 HOURS. 05/21/23   Smitty Cords, DO    Family History Family History  Problem Relation Age of Onset   Diabetes Mother    Hyperlipidemia Mother    Hypertension Mother    Sleep apnea Mother    Obesity Mother    Cancer Father        lung CA   Heart disease Maternal Grandmother    Stroke Maternal Grandmother    Hypertension Maternal Grandmother    Diabetes Maternal Grandmother    Heart disease Maternal  Grandfather    Diabetes Maternal Grandfather    Heart attack Maternal Grandfather    Breast cancer Neg Hx     Social History Social History   Tobacco Use   Smoking status: Never    Passive exposure: Past   Smokeless tobacco: Never  Vaping Use   Vaping status: Never Used  Substance Use Topics   Alcohol use: Yes    Comment: rare   Drug use: No     Allergies   Iodinated contrast media and Codeine   Review of Systems Review of Systems: :negative unless otherwise stated in HPI.      Physical Exam Triage Vital Signs ED Triage Vitals  Encounter Vitals Group     BP 06/12/23 1516 136/80     Systolic BP Percentile --      Diastolic BP Percentile --      Pulse Rate 06/12/23 1516 87     Resp 06/12/23 1516 18     Temp 06/12/23 1516 98.3 F (36.8 C)     Temp Source 06/12/23 1516 Oral     SpO2 06/12/23 1516 95 %     Weight --      Height --      Head Circumference --      Peak Flow --      Pain Score 06/12/23 1518 5     Pain Loc --      Pain Education --      Exclude from Growth Chart --    No data found.  Updated Vital Signs BP 136/80 (BP Location: Left Arm)   Pulse 87   Temp 98.3 F (36.8 C) (Oral)   Resp 18   LMP 05/26/2016 (Exact Date)   SpO2 95%   Visual Acuity Right Eye Distance:   Left Eye Distance:   Bilateral Distance:  Right Eye Near:   Left Eye Near:    Bilateral Near:     Physical Exam GEN: well appearing female in no acute distress  CVS: well perfused  RESP: speaking in full sentences without pause, no respiratory distress  MSK:   Left lower leg:  No evidence of bony deformity, asymmetry, or muscle atrophy. No tenderness over knee or thight  +TTP anterior lower shin overlying discolored rough patch of skin  No calf tenderness  Good active and passive  Strength 5/5 Special Tests: Homan's sign Negative Sensation intact. Peripheral pulses intact. +hair growth bilateral LE     UC Treatments / Results  Labs (all labs ordered are  listed, but only abnormal results are displayed) Labs Reviewed - No data to display  EKG   Radiology US Venous Img Lower Unilateral Left (DVT) Result Date: 06/12/2023 CLINICAL DATA:  Left leg swelling, pain EXAM: LEFT LOWER EXTREMITY VENOUS DOPPLER ULTRASOUND TECHNIQUE: Gray-scale sonography with compression, as well as color and duplex ultrasound, were performed to evaluate the deep venous system(s) from the level of the common femoral vein through the popliteal and proximal calf veins. COMPARISON:  None Available. FINDINGS: VENOUS Normal compressibility of the common femoral, superficial femoral, and popliteal veins, as well as the visualized calf veins. Visualized portions of profunda femoral vein and great saphenous vein unremarkable. No filling defects to suggest DVT on grayscale or color Doppler imaging. Doppler waveforms show normal direction of venous flow, normal respiratory plasticity and response to augmentation. Limited views of the contralateral common femoral vein are unremarkable. OTHER None. Limitations: none IMPRESSION: Negative. Electronically Signed   By: Charlett Nose M.D.   On: 06/12/2023 18:09     Procedures Procedures (including critical care time)  Medications Ordered in UC Medications - No data to display  Initial Impression / Assessment and Plan / UC Course  I have reviewed the triage vital signs and the nursing notes.  Pertinent labs & imaging results that were available during my care of the patient were reviewed by me and considered in my medical decision making (see chart for details).      Pt is a 53 y.o.  female with 3 days of left lower leg pain with edema.   On exam, pt has tenderness at lower anterior. Doubt fracture or bony dislocation therefore plain films deferred. Discussed possibility of DVT with patient as she is agreeable to ultrasound.  STAT ultrasound ordered.   Patient to gradually return to normal activities, as tolerated and continue ordinary  activities within the limits permitted by pain. Prescribed clobetasol for possible eczema flare up on lower leg.   Pt to follow up at the medical mall for ultrasound for possible DVT. Return and ED precautions given. Understanding voiced. Discussed MDM, treatment plan and plan for follow-up with patient who agrees with plan.   Radiologist impression reviewed. No DVT seen. Pt called and updated with results.   Final Clinical Impressions(s) / UC Diagnoses   Final diagnoses:  Pain and swelling of left lower leg     Discharge Instructions      Go to the medical mall to have an ultrasound performed. Look for signs at [facility] for the [facility]. Enter through the medical mall and go to the radiology department for your ultrasound.        ED Prescriptions     Medication Sig Dispense Auth. Provider   clobetasol cream (TEMOVATE) 0.05 % Apply 1 Application topically 2 (two) times daily. For  up to 1-2 weeks then as needed. 30 g Katha Cabal, DO      PDMP not reviewed this encounter.   Katha Cabal, DO 06/12/23 1909

## 2023-06-12 NOTE — Discharge Instructions (Signed)
Go to the medical mall to have an ultrasound performed. Look for signs at [facility] for the [facility]. Enter through the medical mall and go to the radiology department for your ultrasound.

## 2023-06-12 NOTE — ED Triage Notes (Signed)
 Pt states she is having left calf/foot pain x 3 days. No injuries or falls that pt can think of.

## 2023-07-15 NOTE — Progress Notes (Signed)
 Office Visit Note  Patient: Caitlin Barrett             Date of Birth: [birth year]           MRN: [id number]             PCP: Raina Bunting, DO Referring: Domingo Friend * Visit Date: 07/24/2023 Occupation: @GUAROCC @  Subjective:  Pain of the Lower Back   History of Present Illness: Caitlin Barrett is a 53 y.o. female with osteoarthritis and myofascial pain syndrome.  She returns today after her last visit in October 2024.  Patient reports that while coming over here she twisted and started experiencing pain and discomfort in her lower back.  The pain is localized to her lower back and is not radiating into her lower extremities.  She states her right shoulder and right trapezius pain has improved.  She continues to have pain and discomfort in her bilateral trochanteric bursae especially at nighttime.  She has some discomfort in her both knees.  She has not noticed any joint swelling.  She is not experiencing any discomfort in her feet today.  She is on Cymbalta  40 mg p.o. daily.  Flexeril  10 mg p.o. nightly as needed muscle spasms.  She takes trazodone  25 to 50 mg p.o. nightly for insomnia.    Activities of Daily Living:  Patient reports morning stiffness for 1.5 hours.   Patient Reports nocturnal pain.  Difficulty dressing/grooming: Denies Difficulty climbing stairs: Reports Difficulty getting out of chair: Reports Difficulty using hands for taps, buttons, cutlery, and/or writing: Denies  Review of Systems  Constitutional:  Positive for fatigue.  HENT:  Positive for mouth dryness. Negative for mouth sores.   Eyes:  Positive for dryness.  Respiratory:  Negative for difficulty breathing.   Cardiovascular:  Positive for palpitations and swelling in legs/feet.  Gastrointestinal:  Positive for constipation and diarrhea. Negative for blood in stool.  Endocrine: Negative for increased urination.  Genitourinary:  Negative for involuntary urination.  Musculoskeletal:   Positive for joint pain, joint pain, myalgias, morning stiffness, muscle tenderness and myalgias. Negative for gait problem, joint swelling and muscle weakness.  Skin:  Positive for hair loss and sensitivity to sunlight. Negative for color change.  Allergic/Immunologic: Positive for susceptible to infections.  Neurological:  Positive for dizziness and headaches.  Hematological:  Negative for swollen glands.  Psychiatric/Behavioral:  Positive for depressed mood and sleep disturbance. The patient is nervous/anxious.     PMFS History:  Patient Active Problem List   Diagnosis Date Noted   BMI 39.0-39.9,adult-current bmi 39.4 06/20/2022   Morbid obesity (HCC)-start bmi 40.91 06/20/2022   Depression 08/11/2021   Bilateral lower extremity edema 08/06/2021   Status post nasal septoplasty 03/30/2021   Colon cancer screening    Polyp of colon    Depressed mood 12/01/2020   Nausea without vomiting 11/07/2020   Thyroid  disease 06/09/2020   Cervical strain 04/25/2020   Fatigue 04/04/2020   GAD (generalized anxiety disorder) 02/08/2020   Pericardial pain 01/28/2020   Muscle soreness 01/28/2020   Mixed hyperlipidemia 11/25/2019   Essential hypertension 11/25/2019   Polyphagia 10/08/2019   Vitamin D  deficiency 09/17/2019   Insulin  resistance 09/17/2019   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  Takes allergy shots at [facility]   Anemia    Anxiety    Arthritis    Asthma    exercise induced--no inhalers   Back pain    Cancer (HCC) 2010   Basal cell skin cancer Lt eye, skin graft above Lt eye   Constipation    Costochondritis    Depression    Dysmenorrhea    Eczema    Edema, lower extremity    GERD (gastroesophageal reflux disease)    Headache    Hypothyroid    Nodules and Fine needle aspiration   IBS (irritable bowel syndrome)    Lab test positive for detection of COVID-19 virus    November 2020   Myalgia    Osteoarthritis    Painful menstrual periods    Palpitations    Plantar fasciitis    Shortness of breath    Sprain of carpal (joint) of wrist    Vitamin B 12 deficiency    Vitamin D  deficiency     Family History  Problem Relation Age of Onset   Diabetes Mother    Hyperlipidemia Mother    Hypertension Mother    Sleep apnea Mother    Obesity Mother    Cancer Father        lung CA   Heart disease Maternal Grandmother    Stroke Maternal Grandmother    Hypertension Maternal Grandmother    Diabetes Maternal Grandmother    Heart disease Maternal Grandfather    Diabetes Maternal Grandfather    Heart attack Maternal Grandfather    Breast cancer Neg Hx    Past Surgical History:  Procedure Laterality Date   ABDOMINAL HYSTERECTOMY Bilateral 07/02/2016   Procedure: HYSTERECTOMY ABDOMINAL WITH BILATERAL SALPINGECTOMY-MIDLINE INCISION;  Surgeon: Colan Dash, MD;  Location: [facility];  Service: Gynecology;  Laterality: Bilateral;   CHOLECYSTECTOMY  2009   COLONOSCOPY WITH PROPOFOL  N/A 01/26/2021   Procedure: COLONOSCOPY WITH PROPOFOL ;  Surgeon: Selena Daily, MD;  Location: [facility];  Service: Gastroenterology;  Laterality: N/A;   fibromalga     HERNIA REPAIR  1974   umbilical   NASAL SEPTOPLASTY W/ TURBINOPLASTY Bilateral 03/03/2021    Procedure: NASAL SEPTOPLASTY WITH SUBMUCOSAL RESECTION OF TURBINATE;  Surgeon: Lesly Raspberry, MD;  Location: [facility];  Service: ENT;  Laterality: Bilateral;   SKIN GRAFT  2010   TONSILLECTOMY     Social History   Social History Narrative   Not on file   Immunization History  Administered Date(s) Administered   Influenza Inj Mdck Quad Pf 01/29/2019   Influenza Nasal 01/16/2018   Influenza-Unspecified 01/25/2020   Tdap 06/14/2017     Objective: Vital Signs: BP 128/82 (BP Location: Left Arm, Patient Position: Sitting, Cuff Size: Large)   Pulse 92   Resp 15   Ht 5' 8.5" (1.74 m)   Wt 292 lb (132.5 kg)   LMP 05/26/2016 (Exact Date)   BMI 43.75 kg/m    Physical Exam Vitals and nursing note reviewed.  Constitutional:      Appearance: She is well-developed.  HENT:     Head: Normocephalic and atraumatic.  Eyes:     Conjunctiva/sclera: Conjunctivae normal.  Cardiovascular:     Rate and Rhythm: Normal rate and regular rhythm.     Heart sounds: Normal heart sounds.  Pulmonary:     Effort: Pulmonary effort is normal.     Breath sounds: Normal breath sounds.  Abdominal:     General: Bowel sounds are normal.     Palpations: Abdomen is soft.  Musculoskeletal:     Cervical back: Normal range of motion.  Lymphadenopathy:     Cervical: No cervical adenopathy.  Skin:    General: Skin is warm and dry.     Capillary Refill: Capillary refill takes less than 2 seconds.  Neurological:     Mental Status: She is alert and oriented to person, place, and time.  Psychiatric:        Behavior: Behavior normal.      Musculoskeletal Exam: Cervical spine was in good range of motion.  She had discomfort in the lower lumbar region.  She had tenderness over right trapezius region and bilateral trochanteric bursa.  Shoulders, elbows, wrist, MCPs, PIPs and DIPs were in good range of motion with no synovitis.  Hip joints and knee joints in good range of motion without any warmth  swelling or effusion.  There was no tenderness over ankles or MTPs.  She had generalized hyperalgesia and positive tender points.  CDAI Exam: CDAI Score: -- Patient Global: --; Provider Global: -- Swollen: --; Tender: -- Joint Exam 07/24/2023   No joint exam has been documented for this visit   There is currently no information documented on the homunculus. Go to the Rheumatology activity and complete the homunculus joint exam.  Investigation: No additional findings.  Imaging: No results found.  Recent Labs: Lab Results  Component Value Date   WBC 10.5 02/14/2023   HGB 13.3 02/14/2023   PLT 401 (H) 02/14/2023   NA 136 02/14/2023   K 3.8 02/14/2023   CL 100 02/14/2023   CO2 26 02/14/2023   GLUCOSE 119 (H) 02/14/2023   BUN 19 02/14/2023   CREATININE 0.77 02/14/2023   BILITOT 0.2 11/29/2022   ALKPHOS 56 11/29/2022   AST 11 11/29/2022   ALT 9 11/29/2022   PROT 6.3 11/29/2022   ALBUMIN 4.1 11/29/2022   CALCIUM 9.0 02/14/2023   GFRAA 89 03/31/2020    Speciality Comments: No specialty comments available.  Procedures:  No procedures performed Allergies: Iodinated contrast media and Codeine   Assessment / Plan:     Visit Diagnoses: Myofascial pain - Autoimmune work-up negative 04/28/2019: She continues to have generalized pain and discomfort.  She had positive tender points and hyperalgesia.  Despite taking a combination of Cymbalta  40 mg daily and Flexeril  10 mg p.o. nightly she continues to have discomfort.  Need for regular exercise was discussed.  Benefits of water aerobics and swimming were discussed.  She plans to join a [employer].  Trapezius muscle spasm-she complains of some discomfort in the right trapezius region.  She declined trigger point injection.  A handout on neck exercises was given.  Trochanteric bursitis of both hips-she had tendon Raynauds over bilateral trochanteric bursa.  She continues to have nocturnal pain.  A handout on IT band stretches was  given.  Primary osteoarthritis of left knee -she has intermittent discomfort in her knee joints.  No warmth swelling or effusion was noted.  She had moderate osteoarthritis and moderate chondromalacia patella on previous x-rays..  Evaluated  at [facility] in the past.  Acute midline low back pain without sciatica-patient states she sprained her back while coming here.  She had some lower back discomfort without any point tenderness.  She denies any radiculopathy.  A handout on back exercises was given.  I also advised her to take a tablet of Flexeril  when she reaches home.  She was advised not to drive after taking Flexeril .  Chronic SI joint pain-she continues to have intermittent discomfort in the SI joints.  There was no tenderness on palpation today.  Muscle fatigue-she continues to have muscle pain and discomfort.  CK was 30 on January 10, 2023.  She will benefit from exercises.  Vitamin D  deficiency-she has been taking vitamin D .  Vitamin D  was normal at 49.2 on March 21, 2023.  Other medical problems are listed as follows:  Basal cell carcinoma (BCC) of skin of other part of face  Anxiety with depression  Central hypothyroidism  Exercise-induced asthma  History of IBS  Family history of psoriasis in sister  Orders: No orders of the defined types were placed in this encounter.  No orders of the defined types were placed in this encounter.    Follow-Up Instructions: Return in about 6 months (around 01/23/2024) for Osteoarthritis.   Nicholas Bari, MD  Note - This record has been created using Animal nutritionist.  Chart creation errors have been sought, but may not always  have been located. Such creation errors do not reflect on  the standard of medical care.

## 2023-07-24 ENCOUNTER — Ambulatory Visit: Payer: Commercial Managed Care - PPO | Attending: Rheumatology | Admitting: Rheumatology

## 2023-07-24 ENCOUNTER — Ambulatory Visit (INDEPENDENT_AMBULATORY_CARE_PROVIDER_SITE_OTHER): Admitting: Adult Health

## 2023-07-24 ENCOUNTER — Encounter: Payer: Self-pay | Admitting: Rheumatology

## 2023-07-24 VITALS — BP 128/82 | HR 92 | Resp 15 | Ht 68.5 in | Wt 292.0 lb

## 2023-07-24 DIAGNOSIS — J45990 Exercise induced bronchospasm: Secondary | ICD-10-CM

## 2023-07-24 DIAGNOSIS — M1712 Unilateral primary osteoarthritis, left knee: Secondary | ICD-10-CM | POA: Diagnosis not present

## 2023-07-24 DIAGNOSIS — M7062 Trochanteric bursitis, left hip: Secondary | ICD-10-CM

## 2023-07-24 DIAGNOSIS — M545 Low back pain, unspecified: Secondary | ICD-10-CM | POA: Diagnosis not present

## 2023-07-24 DIAGNOSIS — M7061 Trochanteric bursitis, right hip: Secondary | ICD-10-CM

## 2023-07-24 DIAGNOSIS — M7918 Myalgia, other site: Secondary | ICD-10-CM | POA: Diagnosis not present

## 2023-07-24 DIAGNOSIS — C44319 Basal cell carcinoma of skin of other parts of face: Secondary | ICD-10-CM

## 2023-07-24 DIAGNOSIS — G8929 Other chronic pain: Secondary | ICD-10-CM

## 2023-07-24 DIAGNOSIS — E559 Vitamin D deficiency, unspecified: Secondary | ICD-10-CM

## 2023-07-24 DIAGNOSIS — Z840 Family history of diseases of the skin and subcutaneous tissue: Secondary | ICD-10-CM

## 2023-07-24 DIAGNOSIS — M62838 Other muscle spasm: Secondary | ICD-10-CM

## 2023-07-24 DIAGNOSIS — Z8719 Personal history of other diseases of the digestive system: Secondary | ICD-10-CM

## 2023-07-24 DIAGNOSIS — M533 Sacrococcygeal disorders, not elsewhere classified: Secondary | ICD-10-CM

## 2023-07-24 DIAGNOSIS — M6289 Other specified disorders of muscle: Secondary | ICD-10-CM

## 2023-07-24 DIAGNOSIS — G5761 Lesion of plantar nerve, right lower limb: Secondary | ICD-10-CM

## 2023-07-24 DIAGNOSIS — E038 Other specified hypothyroidism: Secondary | ICD-10-CM

## 2023-07-24 DIAGNOSIS — F418 Other specified anxiety disorders: Secondary | ICD-10-CM

## 2023-07-24 NOTE — Patient Instructions (Signed)
 Low Back Sprain or Strain Rehab Ask your health care provider which exercises are safe for you. Do exercises exactly as told by your health care provider and adjust them as directed. It is normal to feel mild stretching, pulling, tightness, or discomfort as you do these exercises. Stop right away if you feel sudden pain or your pain gets worse. Do not begin these exercises until told by your health care provider. Stretching and range-of-motion exercises These exercises warm up your muscles and joints and improve the movement and flexibility of your back. These exercises also help to relieve pain, numbness, and tingling. Lumbar rotation  Lie on your back on a firm bed or the floor with your knees bent. Straighten your arms out to your sides so each arm forms a 90-degree angle (right angle) with a side of your body. Slowly move (rotate) both of your knees to one side of your body until you feel a stretch in your lower back (lumbar). Try not to let your shoulders lift off the floor. Hold this position for __________ seconds. Tense your abdominal muscles and slowly move your knees back to the starting position. Repeat this exercise on the other side of your body. Repeat __________ times. Complete this exercise __________ times a day. Single knee to chest  Lie on your back on a firm bed or the floor with both legs straight. Bend one of your knees. Use your hands to move your knee up toward your chest until you feel a gentle stretch in your lower back and buttock. Hold your leg in this position by holding on to the front of your knee. Keep your other leg as straight as possible. Hold this position for __________ seconds. Slowly return to the starting position. Repeat with your other leg. Repeat __________ times. Complete this exercise __________ times a day. Prone extension on elbows  Lie on your abdomen on a firm bed or the floor (prone position). Prop yourself up on your elbows. Use your arms  to help lift your chest up until you feel a gentle stretch in your abdomen and your lower back. This will place some of your body weight on your elbows. If this is uncomfortable, try stacking pillows under your chest. Your hips should stay down, against the surface that you are lying on. Keep your hip and back muscles relaxed. Hold this position for __________ seconds. Slowly relax your upper body and return to the starting position. Repeat __________ times. Complete this exercise __________ times a day. Strengthening exercises These exercises build strength and endurance in your back. Endurance is the ability to use your muscles for a long time, even after they get tired. Pelvic tilt This exercise strengthens the muscles that lie deep in the abdomen. Lie on your back on a firm bed or the floor with your legs extended. Bend your knees so they are pointing toward the ceiling and your feet are flat on the floor. Tighten your lower abdominal muscles to press your lower back against the floor. This motion will tilt your pelvis so your tailbone points up toward the ceiling instead of pointing to your feet or the floor. To help with this exercise, you may place a small towel under your lower back and try to push your back into the towel. Hold this position for __________ seconds. Let your muscles relax completely before you repeat this exercise. Repeat __________ times. Complete this exercise __________ times a day. Alternating arm and leg raises  Get on your hands  and knees on a firm surface. If you are on a hard floor, you may want to use padding, such as an exercise mat, to cushion your knees. Line up your arms and legs. Your hands should be directly below your shoulders, and your knees should be directly below your hips. Lift your left leg behind you. At the same time, raise your right arm and straighten it in front of you. Do not lift your leg higher than your hip. Do not lift your arm higher  than your shoulder. Keep your abdominal and back muscles tight. Keep your hips facing the ground. Do not arch your back. Keep your balance carefully, and do not hold your breath. Hold this position for __________ seconds. Slowly return to the starting position. Repeat with your right leg and your left arm. Repeat __________ times. Complete this exercise __________ times a day. Abdominal set with straight leg raise  Lie on your back on a firm bed or the floor. Bend one of your knees and keep your other leg straight. Tense your abdominal muscles and lift your straight leg up, 4-6 inches (10-15 cm) off the ground. Keep your abdominal muscles tight and hold this position for __________ seconds. Do not hold your breath. Do not arch your back. Keep it flat against the ground. Keep your abdominal muscles tense as you slowly lower your leg back to the starting position. Repeat with your other leg. Repeat __________ times. Complete this exercise __________ times a day. Single leg lower with bent knees Lie on your back on a firm bed or the floor. Tense your abdominal muscles and lift your feet off the floor, one foot at a time, so your knees and hips are bent in 90-degree angles (right angles). Your knees should be over your hips and your lower legs should be parallel to the floor. Keeping your abdominal muscles tense and your knee bent, slowly lower one of your legs so your toe touches the ground. Lift your leg back up to return to the starting position. Do not hold your breath. Do not let your back arch. Keep your back flat against the ground. Repeat with your other leg. Repeat __________ times. Complete this exercise __________ times a day. Posture and body mechanics Good posture and healthy body mechanics can help to relieve stress in your body's tissues and joints. Body mechanics refers to the movements and positions of your body while you do your daily activities. Posture is part of body  mechanics. Good posture means: Your spine is in its natural S-curve position (neutral). Your shoulders are pulled back slightly. Your head is not tipped forward (neutral). Follow these guidelines to improve your posture and body mechanics in your everyday activities. Standing  When standing, keep your spine neutral and your feet about hip-width apart. Keep a slight bend in your knees. Your ears, shoulders, and hips should line up. When you do a task in which you stand in one place for a long time, place one foot up on a stable object that is 2-4 inches (5-10 cm) high, such as a footstool. This helps keep your spine neutral. Sitting  When sitting, keep your spine neutral and keep your feet flat on the floor. Use a footrest, if necessary, and keep your thighs parallel to the floor. Avoid rounding your shoulders, and avoid tilting your head forward. When working at a desk or a computer, keep your desk at a height where your hands are slightly lower than your elbows. Slide your  chair under your desk so you are close enough to maintain good posture. When working at a computer, place your monitor at a height where you are looking straight ahead and you do not have to tilt your head forward or downward to look at the screen. Resting When lying down and resting, avoid positions that are most painful for you. If you have pain with activities such as sitting, bending, stooping, or squatting, lie in a position in which your body does not bend very much. For example, avoid curling up on your side with your arms and knees near your chest (fetal position). If you have pain with activities such as standing for a long time or reaching with your arms, lie with your spine in a neutral position and bend your knees slightly. Try the following positions: Lying on your side with a pillow between your knees. Lying on your back with a pillow under your knees. Lifting  When lifting objects, keep your feet at least  shoulder-width apart and tighten your abdominal muscles. Bend your knees and hips and keep your spine neutral. It is important to lift using the strength of your legs, not your back. Do not lock your knees straight out. Always ask for help to lift heavy or awkward objects. This information is not intended to replace advice given to you by your health care provider. Make sure you discuss any questions you have with your health care provider. Document Revised: 07/16/2022 Document Reviewed: 05/30/2020 [employer] Patient Education  2024 [employer]  Iliotibial Band Syndrome Rehab Ask your health care provider which exercises are safe for you. Do exercises exactly as told by your provider and adjust them as told. It's normal to feel mild stretching, pulling, tightness, or discomfort as you do these exercises. Stop right away if you feel sudden pain or your pain gets a lot worse. Do not begin these exercises until told by your provider. Stretching and range-of-motion exercises These exercises warm up your muscles and joints. They also improve the movement and flexibility of your hip and pelvis. Quadriceps stretch, prone  Lie face down (prone) on a firm surface like a bed or padded floor. Bend your left / right knee. Reach back to hold your ankle or pant leg. If you can't reach your ankle or pant leg, use a belt looped around your foot and grab the belt instead. Gently pull your heel toward your butt. Your knee should not slide out to the side. You should feel a stretch in the front of your thigh and knee, also called the quadriceps. Hold this position for __________ seconds. Repeat __________ times. Complete this exercise __________ times a day. Iliotibial band stretch The iliotibial band is a strip of tissue that runs along the outside of your hip down to your knee. Lie on your side with your left / right leg on top. Bend both knees and grab your left / right ankle. Stretch out your bottom arm to  help you balance. Slowly bring your top knee back so your thigh goes behind your back. Slowly lower your top leg toward the floor until you feel a gentle stretch on the outside of your left / right hip and thigh. If you don't feel a stretch and your knee won't go farther, place the heel of your other foot on top of your knee and pull your knee down toward the floor with your foot. Hold this position for __________ seconds. Repeat __________ times. Complete this exercise __________ times a  day. Strengthening exercises These exercises build strength and endurance in your hip and pelvis. Endurance means your muscles can keep working even when they're tired. Straight leg raises, side-lying This exercise strengthens the muscles that rotate the leg at the hip and move it away from your body. These muscles are called hip abductors. Lie on your side with your left / right leg on top. Lie so your head, shoulder, hip, and knee line up. You can bend your bottom knee to help you balance. Roll your hips slightly forward so they're stacked directly over each other. Your left / right knee should face forward. Tense the muscles in your outer thigh and hip. Lift your top leg 4-6 inches (10-15 cm) off the ground. Hold this position for __________ seconds. Slowly lower your leg back down to the starting position. Let your muscles fully relax before doing this exercise again. Repeat __________ times. Complete this exercise __________ times a day. Leg raises, prone This exercise strengthens the muscles that move the hips backward. These muscles are called hip extensors. Lie face down (prone) on your bed or a firm surface. You can put a pillow under your hips for comfort and to support your lower back. Bend your left / right knee so your foot points straight up toward the ceiling. Keep the other leg straight and behind you. Squeeze your butt muscles. Lift your left / right thigh off the firm surface. Do not let your  back arch. Tense your thigh muscle as hard as you can without having more knee pain. Hold this position for __________ seconds. Slowly lower your leg to the starting position. Allow your leg to relax all the way. Repeat __________ times. Complete this exercise __________ times a day. Hip hike  Stand sideways on a bottom step. Place your feet so that your left / right leg is on the step, and the other foot is hanging off the side. If you need support for balance, hold onto a railing or wall. Keep your knees straight and your abdomen square, meaning your hips are level. Then, lift your left / right hip up toward the ceiling. Slowly let your leg that's hanging off the step lower towards the floor. Your foot should get closer to the ground. Do not lean or bend your knees during this movement. Repeat __________ times. Complete this exercise __________ times a day. This information is not intended to replace advice given to you by your health care provider. Make sure you discuss any questions you have with your health care provider. Document Revised: 05/25/2022 Document Reviewed: 05/25/2022 Elsevier Patient Education  2024 [employer]  Cervical Strain and Sprain Rehab Ask your health care provider which exercises are safe for you. Do exercises exactly as told by your health care provider and adjust them as directed. It is normal to feel mild stretching, pulling, tightness, or discomfort as you do these exercises. Stop right away if you feel sudden pain or your pain gets worse. Do not begin these exercises until told by your health care provider. Stretching and range-of-motion exercises Cervical side bending  Using good posture, sit on a stable chair or stand up. Without moving your shoulders, slowly tilt your left / right ear to your shoulder until you feel a stretch in the neck muscles on the opposite side. You should be looking straight ahead. Hold for __________ seconds. Repeat with the other  side of your neck. Repeat __________ times. Complete this exercise __________ times a day. Cervical rotation  Using good posture, sit on a stable chair or stand up. Slowly turn your head to the side as if you are looking over your left / right shoulder. Keep your eyes level with the ground. Stop when you feel a stretch along the side and the back of your neck. Hold for __________ seconds. Repeat this by turning to your other side. Repeat __________ times. Complete this exercise __________ times a day. Thoracic extension and pectoral stretch  Roll a towel or a small blanket so it is about 4 inches (10 cm) in diameter. Lie down on your back on a firm surface. Put the towel in the middle of your back across your spine. It should not be under your shoulder blades. Put your hands behind your head and let your elbows fall out to your sides. Hold for __________ seconds. Repeat __________ times. Complete this exercise __________ times a day. Strengthening exercises Upper cervical flexion  Lie on your back with a thin pillow behind your head or a small, rolled-up towel under your neck. Gently tuck your chin toward your chest and nod your head down to look toward your feet. Do not lift your head off the pillow. Hold for __________ seconds. Release the tension slowly. Relax your neck muscles completely before you repeat this exercise. Repeat __________ times. Complete this exercise __________ times a day. Cervical extension  Stand about 6 inches (15 cm) away from a wall, with your back facing the wall. Place a soft object, about 6-8 inches (15-20 cm) in diameter, between the back of your head and the wall. A soft object could be a small pillow, a ball, or a folded towel. Gently tilt your head back and press into the soft object. Keep your jaw and forehead relaxed. Hold for __________ seconds. Release the tension slowly. Relax your neck muscles completely before you repeat this exercise. Repeat  __________ times. Complete this exercise __________ times a day. Posture and body mechanics Body mechanics refer to the movements and positions of your body while you do your daily activities. Posture is part of body mechanics. Good posture and healthy body mechanics can help to relieve stress in your body's tissues and joints. Good posture means that your spine is in its natural S-curve position (your spine is neutral), your shoulders are pulled back slightly, and your head is not tipped forward. The following are general guidelines for using improved posture and body mechanics in your everyday activities. Sitting  When sitting, keep your spine neutral and keep your feet flat on the floor. Use a footrest, if needed, and keep your thighs parallel to the floor. Avoid rounding your shoulders. Avoid tilting your head forward. When working at a desk or a computer, keep your desk at a height where your hands are slightly lower than your elbows. Slide your chair under your desk so you are close enough to maintain good posture. When working at a computer, place your monitor at a height where you are looking straight ahead and you do not have to tilt your head forward or downward to look at the screen. Standing  When standing, keep your spine neutral and keep your feet about hip-width apart. Keep a slight bend in your knees. Your ears, shoulders, and hips should line up. When you do a task in which you stand in one place for a long time, place one foot up on a stable object that is 2-4 inches (5-10 cm) high, such as a footstool. This helps keep your spine  neutral. Resting When lying down and resting, avoid positions that are most painful for you. Try to support your neck in a neutral position. You can use a contour pillow or a small rolled-up towel. Your pillow should support your neck but not push on it. This information is not intended to replace advice given to you by your health care provider. Make sure  you discuss any questions you have with your health care provider. Document Revised: 07/16/2022 Document Reviewed: 10/02/2021 Elsevier Patient Education  2024 [employer].

## 2023-07-29 ENCOUNTER — Other Ambulatory Visit (INDEPENDENT_AMBULATORY_CARE_PROVIDER_SITE_OTHER): Payer: Self-pay | Admitting: Adult Health

## 2023-07-29 ENCOUNTER — Other Ambulatory Visit: Payer: Self-pay | Admitting: Family Medicine

## 2023-07-29 DIAGNOSIS — G43909 Migraine, unspecified, not intractable, without status migrainosus: Secondary | ICD-10-CM

## 2023-07-30 NOTE — Telephone Encounter (Signed)
 Requested Prescriptions  Pending Prescriptions Disp Refills   rizatriptan  (MAXALT -MLT) 10 MG disintegrating tablet [Pharmacy Med Name: RIZATRIPTAN  10 MG ODT] 10 tablet 1    Sig: TAKE 1 TABLET BY MOUTH AS NEEDED FOR MIGRAINE. MAY REPEAT IN 2 HOURS IF NEEDED. MAX DOSE IS 2 TABS IN 24 HOURS.     Neurology:  Migraine Therapy - Triptan Failed - 07/30/2023  3:20 PM      Failed - Valid encounter within last 12 months    Recent Outpatient Visits   None     Future Appointments             In 1 month Karamalegos, Kayleen Party, DO  [facility]   In 5 months Nicholas Bari, MD [facility] - A Dept Of [place]            Passed - Last BP in normal range    BP Readings from Last 1 Encounters:  07/24/23 128/82

## 2023-08-13 ENCOUNTER — Encounter (INDEPENDENT_AMBULATORY_CARE_PROVIDER_SITE_OTHER): Payer: Self-pay | Admitting: Adult Health

## 2023-08-13 ENCOUNTER — Ambulatory Visit (INDEPENDENT_AMBULATORY_CARE_PROVIDER_SITE_OTHER): Admitting: Adult Health

## 2023-08-13 VITALS — BP 143/85 | HR 92 | Temp 98.1°F | Ht 68.0 in | Wt 285.0 lb

## 2023-08-13 DIAGNOSIS — I10 Essential (primary) hypertension: Secondary | ICD-10-CM

## 2023-08-13 DIAGNOSIS — Z6841 Body Mass Index (BMI) 40.0 and over, adult: Secondary | ICD-10-CM

## 2023-08-13 DIAGNOSIS — E88819 Insulin resistance, unspecified: Secondary | ICD-10-CM | POA: Diagnosis not present

## 2023-08-13 DIAGNOSIS — E538 Deficiency of other specified B group vitamins: Secondary | ICD-10-CM

## 2023-08-13 DIAGNOSIS — F329 Major depressive disorder, single episode, unspecified: Secondary | ICD-10-CM

## 2023-08-13 DIAGNOSIS — E559 Vitamin D deficiency, unspecified: Secondary | ICD-10-CM

## 2023-08-13 MED ORDER — TRAZODONE HCL 50 MG PO TABS
25.0000 mg | ORAL_TABLET | Freq: Every evening | ORAL | 0 refills | Status: DC | PRN
Start: 2023-08-13 — End: 2023-10-08

## 2023-08-13 MED ORDER — DULOXETINE HCL 60 MG PO CPEP: 60.0000 mg | ORAL_CAPSULE | Freq: Every day | ORAL | 0 refills | Status: DC

## 2023-08-13 MED ORDER — VITAMIN D (ERGOCALCIFEROL) 1.25 MG (50000 UNIT) PO CAPS: ORAL_CAPSULE | ORAL | 0 refills | Status: DC

## 2023-08-13 NOTE — Progress Notes (Signed)
 WEIGHT SUMMARY AND BIOMETRICS  Vitals Temp: 98.1 F (36.7 C) BP: (!) 143/85 Pulse Rate: 92 SpO2: 97 %   Anthropometric Measurements Height: 5\' 8"  (1.727 m) Weight: 285 lb (129.3 kg) BMI (Calculated): 43.34 Weight at Last Visit: 279lb Weight Lost Since Last Visit: 0 Weight Gained Since Last Visit: 6lb Starting Weight: 277lb Total Weight Loss (lbs): 0 lb (0 kg)   Body Composition  Body Fat %: 51.3 % Fat Mass (lbs): 146.4 lbs Muscle Mass (lbs): 132 lbs Total Body Water (lbs): 103.8 lbs Visceral Fat Rating : 16   Other Clinical Data Fasting: no Labs: no Today's Visit #: 66 Starting Date: 08/13/19    Chief Complaint:   OBESITY Caitlin Barrett is here to discuss her progress with her obesity treatment plan.  She is on the the Category 3 Plan and states she is following her eating plan approximately 40 % of the time.  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 Her husband was in [facility]- been home recovering the last 2.5 weeks Due to the nature of his injuries, he has been unable to work Equities trader)  Stress- their cat, "Romeo" needs hip repair surgery this month   Subjective:   1. Essential hypertension BP above goal at OV She denies CP with exertion PCP manages amLODipine  (NORVASC ) 5 MG tablet   2. B12 deficiency Vitamin B12 232 - 1,245 pg/mL 163 (L)   She is on oral B12 500 mcg- takes supplement daily  3. Vitamin D  deficiency  Latest Reference Range & Units 03/21/23 11:42  Vitamin D , 25-Hydroxy 30.0 - 100.0 ng/mL 49.2      (L): Data is abnormally low  She is on Ergocalciferol  Q14 days She denies N/V/Muscle Weakness  4. Insulin  resistance  Latest Reference Range & Units 11/29/22 09:48 02/14/23 17:10 03/21/23 11:42  Glucose 70 - 99 mg/dL 93 409 (H)   Hemoglobin A1C 4.8 - 5.6 % 5.4  5.2  Est. average glucose Bld gHb Est-mCnc mg/dL 811  914  INSULIN  2.6 - 24.9 uIU/mL 17.1   9.3  (H): Data is abnormally high  She was unable to tolerate Metformin . HWW and her various specialists have attempt GLP-1 and GIP/GLP-1 Rx- her insurance will not cover Cash Option Rx is cost prohibtive  5. Major depressive disorder, remission status unspecified, unspecified whether recurrent The last several weeks she endorses increased pain. She is currently on daily Duloetine 40mg - denies SI/HI She is agreeable to increase from 40mg  to 60mg   Assessment/Plan:   1. Essential hypertension (Primary) Limit Na+ Increase activity as tolerated  2. B12 deficiency Check Labs at next OV  3. Vitamin D  deficiency Refill - Vitamin D , Ergocalciferol , (DRISDOL ) 1.25 MG (50000 UNIT) CAPS capsule; TAKE ONE CAPSULE BY MOUTH EVERY 14 DAYS  Dispense: 8 capsule; Refill: 0  4. Insulin  resistance Check Labs at next OV  5. Major depressive disorder, remission status unspecified, unspecified whether recurrent Refill and INCREASE  DULoxetine  (CYMBALTA ) 60 MG capsule Take 1 capsule (60 mg total) by mouth daily. Dispense: 90 capsule, Refills: 0 ordered   6. Morbid obesity (HCC), Current BMI 43.34  Safaa is currently in the action stage of change. As such, her goal is to continue with weight loss efforts. She has agreed to the Category 3 Plan.   Exercise goals: All adults should avoid inactivity. Some physical activity is better than none, and adults who participate in any amount of physical activity  gain some health benefits. Adults should also include muscle-strengthening activities that involve all major muscle groups on 2 or more days a week.  Behavioral modification strategies: increasing lean protein intake, decreasing simple carbohydrates, increasing vegetables, increasing water intake, no skipping meals, meal planning and cooking strategies, keeping healthy foods in the home, and planning for success.  Caitlin Barrett has agreed to follow-up with our clinic in 4 weeks. She was informed of the  importance of frequent follow-up visits to maximize her success with intensive lifestyle modifications for her multiple health conditions.   Check Fasting Labs at next OV  Objective:   Blood pressure (!) 143/85, pulse 92, temperature 98.1 F (36.7 C), height 5\' 8"  (1.727 m), weight 285 lb (129.3 kg), last menstrual period 05/26/2016, SpO2 97%. Body mass index is 43.33 kg/m.  General: Cooperative, alert, well developed, in no acute distress. HEENT: Conjunctivae and lids unremarkable. Cardiovascular: Regular rhythm.  Lungs: Normal work of breathing. Neurologic: No focal deficits.   Lab Results  Component Value Date   CREATININE 0.77 02/14/2023   BUN 19 02/14/2023   NA 136 02/14/2023   K 3.8 02/14/2023   CL 100 02/14/2023   CO2 26 02/14/2023   Lab Results  Component Value Date   ALT 9 11/29/2022   AST 11 11/29/2022   ALKPHOS 56 11/29/2022   BILITOT 0.2 11/29/2022   Lab Results  Component Value Date   HGBA1C 5.2 03/21/2023   HGBA1C 5.4 11/29/2022   HGBA1C 5.2 08/28/2021   HGBA1C 5.3 05/03/2021   HGBA1C 5.3 12/29/2020   Lab Results  Component Value Date   INSULIN  9.3 03/21/2023   INSULIN  17.1 11/29/2022   INSULIN  10.7 08/28/2021   INSULIN  17.5 05/03/2021   INSULIN  10.1 12/29/2020   Lab Results  Component Value Date   TSH 0.18 (L) 01/10/2023   Lab Results  Component Value Date   CHOL 212 (H) 11/29/2022   HDL 42 11/29/2022   LDLCALC 143 (H) 11/29/2022   TRIG 149 11/29/2022   CHOLHDL 5.0 (H) 11/29/2022   Lab Results  Component Value Date   VD25OH 49.2 03/21/2023   VD25OH 70 01/10/2023   VD25OH 65.4 11/29/2022   Lab Results  Component Value Date   WBC 10.5 02/14/2023   HGB 13.3 02/14/2023   HCT 39.6 02/14/2023   MCV 90.6 02/14/2023   PLT 401 (H) 02/14/2023   Lab Results  Component Value Date   IRON 64 08/13/2019   TIBC 333 08/13/2019   FERRITIN 30 08/13/2019    Attestation Statements:   Reviewed by clinician on day of visit: allergies,  medications, problem list, medical history, surgical history, family history, social history, and previous encounter notes.   I have reviewed the above documentation for accuracy and completeness, and I agree with the above. -  Michaell Grider d. Francess Mullen, NP-C

## 2023-08-28 ENCOUNTER — Other Ambulatory Visit: Payer: Self-pay

## 2023-08-28 MED ORDER — ESTRADIOL 1 MG PO TABS: 1.0000 mg | ORAL_TABLET | Freq: Every day | ORAL | 0 refills | Status: DC

## 2023-09-04 ENCOUNTER — Telehealth: Payer: Self-pay

## 2023-09-04 MED ORDER — ESTRADIOL 1 MG PO TABS: 1.0000 mg | ORAL_TABLET | Freq: Every day | ORAL | 0 refills | Status: DC

## 2023-09-04 NOTE — Telephone Encounter (Signed)
 One additional refill sent until annual with Dr. Everardo Hitch completed.

## 2023-09-11 ENCOUNTER — Ambulatory Visit: Payer: Self-pay | Admitting: Family Medicine

## 2023-09-11 VITALS — BP 130/80 | HR 85 | Ht 68.0 in | Wt 293.0 lb

## 2023-09-11 DIAGNOSIS — I10 Essential (primary) hypertension: Secondary | ICD-10-CM | POA: Diagnosis not present

## 2023-09-11 DIAGNOSIS — R600 Localized edema: Secondary | ICD-10-CM

## 2023-09-11 NOTE — Progress Notes (Signed)
 Subjective:    Patient ID: Caitlin Barrett, female    DOB: Jun 09, 1970, 53 y.o.   MRN: [id number]  MACKENIZE DELGADILLO is a 52 y.o. female presenting on 09/11/2023 for Leg Swelling and Hypertension   HPI  Discussed the use of AI scribe software for clinical note transcription with the patient, who gave verbal consent to proceed.  History of Present Illness   Caitlin Barrett is a 53 year old female with hypertension who presents for a six-month follow-up visit.  CHRONIC HTN: She experiences episodic elevations in blood pressure, particularly after consuming caffeine, with readings sometimes reaching 145/85 mmHg. Her current medication is amlodipine  5 mg daily, which she takes consistently. Her blood pressure is generally stable, with typical readings around 130/80 mmHg. Asymptomatic  Current Meds - Amlodipine  5mg  daily   Reports good compliance, took meds today. Tolerating well, w/o complaints.  Denies CP, dyspnea, HA, dizziness / lightheadedness  Lower Extremity Edema She has persistent swelling in her feet, especially the left foot and leg, which she attributes to her blood pressure medication. The swelling is significant, making it difficult to wear shoes, and persists even after leg elevation. It is present in the morning and causes a dull ache, particularly in the shin area.  ED Visit 05/2023 - had work up for swelling, and ultrasound performed on June 12, 2023, ruled out blockages or blood clots.          09/11/2023    3:28 PM 03/12/2023    1:41 PM 09/10/2022    9:10 AM  Depression screen PHQ 2/9  Decreased Interest 2 1 1   Down, Depressed, Hopeless 2 1 1   PHQ - 2 Score 4 2 2   Altered sleeping 3 2 1   Tired, decreased energy 2 3 2   Change in appetite 2 1 2   Feeling bad or failure about yourself  1 1 1   Trouble concentrating 1 0 0  Moving slowly or fidgety/restless 0 1 0  Suicidal thoughts 0 0 0  PHQ-9 Score 13 10 8   Difficult doing work/chores Somewhat difficult          09/11/2023    3:29 PM 03/12/2023    1:41 PM 09/10/2022    9:11 AM 01/31/2022    1:20 PM  GAD 7 : Generalized Anxiety Score  Nervous, Anxious, on Edge 2 2 2 2   Control/stop worrying 2 2 2 2   Worry too much - different things 3 2 2 2   Trouble relaxing 3 1 2 2   Restless 2 1 1 1   Easily annoyed or irritable 3 1 2 2   Afraid - awful might happen 2 1 0 1  Total GAD 7 Score 17 10 11 12   Anxiety Difficulty Somewhat difficult   Somewhat difficult    Social History   Tobacco Use   Smoking status: Never    Passive exposure: Past   Smokeless tobacco: Never  Vaping Use   Vaping status: Never Used  Substance Use Topics   Alcohol use: Yes    Comment: rare   Drug use: No    Review of Systems Per HPI unless specifically indicated above     Objective:    BP 130/80 (BP Location: Left Arm, Patient Position: Sitting, Cuff Size: Large)   Pulse 85   Ht 5' 8 (1.727 m)   Wt 293 lb (132.9 kg)   LMP 05/26/2016 (Exact Date)   SpO2 98%   BMI 44.55 kg/m   Wt Readings from Last 3 Encounters:  09/11/23 293 lb (132.9 kg)  08/13/23 285 lb (129.3 kg)  07/24/23 292 lb (132.5 kg)    Physical Exam Vitals and nursing note reviewed.  Constitutional:      General: She is not in acute distress.    Appearance: Normal appearance. She is well-developed. She is not diaphoretic.     Comments: Well-appearing, comfortable, cooperative  HENT:     Head: Normocephalic and atraumatic.   Eyes:     General:        Right eye: No discharge.        Left eye: No discharge.     Conjunctiva/sclera: Conjunctivae normal.    Cardiovascular:     Rate and Rhythm: Normal rate.  Pulmonary:     Effort: Pulmonary effort is normal.   Musculoskeletal:     Right lower leg: Edema (+1 pitting, bilateral, no erythema, non tender) present.     Left lower leg: Edema (+1 pitting, bilateral, no erythema, non tender) present.   Skin:    General: Skin is warm and dry.     Findings: No erythema or rash.    Neurological:     Mental Status: She is alert and oriented to person, place, and time.   Psychiatric:        Mood and Affect: Mood normal.        Behavior: Behavior normal.        Thought Content: Thought content normal.     Comments: Well groomed, good eye contact, normal speech and thoughts     I have personally reviewed the radiology report from 06/12/23 on Lower Ext venous Doppler.  CLINICAL DATA:  Left leg swelling, pain   EXAM: LEFT LOWER EXTREMITY VENOUS DOPPLER ULTRASOUND   TECHNIQUE: Gray-scale sonography with compression, as well as color and duplex ultrasound, were performed to evaluate the deep venous system(s) from the level of the common femoral vein through the popliteal and proximal calf veins.   COMPARISON:  None Available.   FINDINGS: VENOUS   Normal compressibility of the common femoral, superficial femoral, and popliteal veins, as well as the visualized calf veins. Visualized portions of profunda femoral vein and great saphenous vein unremarkable. No filling defects to suggest DVT on grayscale or color Doppler imaging. Doppler waveforms show normal direction of venous flow, normal respiratory plasticity and response to augmentation.   Limited views of the contralateral common femoral vein are unremarkable.   OTHER   None.   Limitations: none   IMPRESSION: Negative.     Electronically Signed   By: Janeece Mechanic M.D.   On: 06/12/2023 18:09  Results for orders placed or performed during the hospital encounter of 05/13/23  Wet prep, genital   Collection Time: 05/13/23  1:59 PM  Result Value Ref Range   Yeast Wet Prep HPF POC PRESENT (A) NONE SEEN   Trich, Wet Prep NONE SEEN NONE SEEN   Clue Cells Wet Prep HPF POC PRESENT (A) NONE SEEN   WBC, Wet Prep HPF POC <10 (A) <10   Sperm NONE SEEN       Assessment & Plan:   Problem List Items Addressed This Visit     Bilateral lower extremity edema   Essential hypertension - Primary      Hypertension Blood pressure controlled with amlodipine  5 mg daily, averaging 130/80 mmHg. Occasional elevations to SBP 140s Discussed caffeine as a trigger for episodic elevations. Explained peripheral edema as a side effect and alternative medications losartan or valsartan with rare angioedema risk.  -  She prefers to Continue amlodipine  5 mg daily, and manage swelling - Monitor blood pressure regularly. - Discuss potential triggers such as caffeine intake. - Consider alternative antihypertensive medications (losartan or valsartan) if edema worsens or becomes intolerable. She can contact us  for new orders.  Peripheral Edema Persistent pitting edema likely due to amlodipine , with possible venous insufficiency. Ultrasound ruled out deep vein thrombosis. Discussed potential switch to losartan or valsartan if edema becomes intolerable. Recommended compression socks and RICE therapy. - Consider alternative antihypertensive medications (losartan or valsartan) if edema worsens or becomes intolerable. - Advise on non-pharmacological measures: rest, ice, compression, elevation (RICE therapy). - Recommend compression socks with 15-20 mmHg pressure.  General Health Maintenance Discussed vaccinations for shingles and pneumonia. Recommended Shingrix and Prevnar 20 vaccines. Explained potential side effects and advised checking insurance coverage. - Recommend Shingrix vaccine (two doses, 2-6 months apart) at a pharmacy. - Recommend Prevnar 20 vaccine at a pharmacy, check insurance coverage before administration.  Follow-up Scheduled follow-up for annual physical and lab work. Coordination with other specialists to avoid duplication of lab tests. - Coordinate with other specialists' lab work to avoid duplication.        No orders of the defined types were placed in this encounter.   No orders of the defined types were placed in this encounter.   Follow up plan: Return in about 3 months (around  12/12/2023) for 3 month Annual Physical AM any day, fasting lab AFTER.   Domingo Friend, DO [facility] 09/11/2023, 3:05 PM

## 2023-09-11 NOTE — Patient Instructions (Addendum)
 Thank you for coming to the office today.  Recommend vaccines at the pharmacy.  Prevnar 20 - pneumonia vaccine, check cost coverage online or at pharmacy and get that one, we can do it here if you need.  Shingles vaccine shingrix 2 doses, 2-6 months apart. Pharmacy, cost check.  BP is good today  I think the swelling is from the Amlodipine  and could also be the veins (venous insufficiency)  Consider alternative medications  Valsartan, Losartan, Olmesartan - ARB medications, once a day, works on kidney, very effective, no swelling. They prevent kidney problems. Rare risk of facial / lip / throat angioedema. ---------------------------  Use RICE therapy: - R - Rest / relative rest with activity modification avoid overuse of joint - I - Ice packs (make sure you use a towel or sock / something to protect skin) - C - Compression with stockings / stocks ACE wrap to apply pressure and reduce swelling allowing more support - E - Elevation - if significant swelling, lift leg above heart level (toes above your nose) to help reduce swelling, most helpful at night after day of being on your feet    DUE for FASTING BLOOD WORK (no food or drink after midnight before the lab appointment, only water or coffee without cream/sugar on the morning of)  SCHEDULE Lab Only visit in the morning at the clinic for lab draw in 3 MONTHS   - Make sure Lab Only appointment is at about 1 week before your next appointment, so that results will be available  For Lab Results, once available within 2-3 days of blood draw, you can can log in to MyChart online to view your results and a brief explanation. Also, we can discuss results at next follow-up visit.    Please schedule a Follow-up Appointment to: Return in about 3 months (around 12/12/2023) for 3 month Annual Physical AM any day, fasting lab AFTER.  If you have any other questions or concerns, please feel free to call the office or send a message through  MyChart. You may also schedule an earlier appointment if necessary.  Additionally, you may be receiving a survey about your experience at our office within a few days to 1 week by e-mail or mail. We value your feedback.  Domingo Friend, DO [facility], [place]

## 2023-09-12 ENCOUNTER — Other Ambulatory Visit (INDEPENDENT_AMBULATORY_CARE_PROVIDER_SITE_OTHER): Payer: Self-pay | Admitting: Adult Health

## 2023-09-16 ENCOUNTER — Ambulatory Visit (INDEPENDENT_AMBULATORY_CARE_PROVIDER_SITE_OTHER): Admitting: Obstetrics & Gynecology

## 2023-09-16 ENCOUNTER — Other Ambulatory Visit (HOSPITAL_COMMUNITY)
Admission: RE | Admit: 2023-09-16 | Discharge: 2023-09-16 | Disposition: A | Discharge status: Home / Self Care | Source: Ambulatory Visit | Attending: Obstetrics & Gynecology | Admitting: Obstetrics & Gynecology

## 2023-09-16 ENCOUNTER — Encounter: Payer: Self-pay | Admitting: Obstetrics & Gynecology

## 2023-09-16 VITALS — BP 133/72 | HR 82 | Ht 68.0 in | Wt 298.5 lb

## 2023-09-16 DIAGNOSIS — N898 Other specified noninflammatory disorders of vagina: Secondary | ICD-10-CM | POA: Diagnosis present

## 2023-09-16 DIAGNOSIS — Z01419 Encounter for gynecological examination (general) (routine) without abnormal findings: Secondary | ICD-10-CM

## 2023-09-16 DIAGNOSIS — Z1231 Encounter for screening mammogram for malignant neoplasm of breast: Secondary | ICD-10-CM

## 2023-09-16 MED ORDER — ESTRADIOL 1 MG PO TABS: 1.0000 mg | ORAL_TABLET | Freq: Every day | ORAL | 4 refills | Status: AC

## 2023-09-16 NOTE — Progress Notes (Addendum)
 ANNUAL PREVENTATIVE CARE GYNECOLOGY  ENCOUNTER NOTE  Subjective:       Caitlin Barrett is a 53 y.o. married G0P0000 here for a routine annual gynecologic exam. The patient is sexually active. The patient is taking hormone replacement therapy. She started in 2018 when she had a hysterectomy. She took estrogen alone for about a year but had trouble sleeping so progesterone  was added. This helped with sleeping. She is now aware that HRT versus ERT increases risk of breast cancer. She also takes trazadone to help with sleep. Patient denies post-menopausal vaginal bleeding. The patient wears seatbelts: yes. The patient participates in regular exercise: no. Has the patient ever been transfused or tattooed?: no. The patient reports that there is not domestic violence in her life.  Current complaints: 1.  She reports recurrent yeast infections. She takes OTC monistat. She has some itching today.   Gynecologic History Patient's last menstrual period was 05/26/2016 (exact date).  Last mammogram: 08/2022. Results were: normal Last Colonoscopy: 2022, with polyp, due again about 2027    Obstetric History OB History  Gravida Para Term Preterm AB Living  0 0 0 0 0 0  SAB IAB Ectopic Multiple Live Births  0 0 0 0 0    Past Medical History:  Diagnosis Date   Allergy    seasonal.  Takes allergy shots at [facility]   Anemia    Anxiety    Arthritis    Asthma    exercise induced--no inhalers   Back pain    Cancer (HCC) 2010   Basal cell skin cancer Lt eye, skin graft above Lt eye   Constipation    Costochondritis    Depression    Dysmenorrhea    Eczema    Edema, lower extremity    GERD (gastroesophageal reflux disease)    Headache    Hypothyroid    Nodules and Fine needle aspiration   IBS (irritable bowel syndrome)    Lab test positive for detection of COVID-19 virus    November 2020   Myalgia    Osteoarthritis    Painful menstrual periods    Palpitations    Plantar fasciitis     Shortness of breath    Sprain of carpal (joint) of wrist    Vitamin B 12 deficiency    Vitamin D  deficiency     Family History  Problem Relation Age of Onset   Diabetes Mother    Hyperlipidemia Mother    Hypertension Mother    Sleep apnea Mother    Obesity Mother    Cancer Father        lung CA   Heart disease Maternal Grandmother    Stroke Maternal Grandmother    Hypertension Maternal Grandmother    Diabetes Maternal Grandmother    Heart disease Maternal Grandfather    Diabetes Maternal Grandfather    Heart attack Maternal Grandfather    Breast cancer Neg Hx     Past Surgical History:  Procedure Laterality Date   ABDOMINAL HYSTERECTOMY Bilateral 07/02/2016   Procedure: HYSTERECTOMY ABDOMINAL WITH BILATERAL SALPINGECTOMY-MIDLINE INCISION;  Surgeon: Gladis DELENA Dollar, MD;  Location: [facility] ORS;  Service: Gynecology;  Laterality: Bilateral;   CHOLECYSTECTOMY  2009   COLONOSCOPY WITH PROPOFOL  N/A 01/26/2021   Procedure: COLONOSCOPY WITH PROPOFOL ;  Surgeon: Unk Corinn Skiff, MD;  Location: [facility] ENDOSCOPY;  Service: Gastroenterology;  Laterality: N/A;   fibromalga     HERNIA REPAIR  1974   umbilical   NASAL SEPTOPLASTY W/ TURBINOPLASTY Bilateral  03/03/2021   Procedure: NASAL SEPTOPLASTY WITH SUBMUCOSAL RESECTION OF TURBINATE;  Surgeon: Herminio Miu, MD;  Location: [facility];  Service: ENT;  Laterality: Bilateral;   SKIN GRAFT  2010   TONSILLECTOMY      Social History   Socioeconomic History   Marital status: Married    Spouse name: Not on file   Number of children: Not on file   Years of education: Not on file   Highest education level: Bachelor's degree (e.g., BA, AB, BS)  Occupational History   Occupation: pet sitter/dog walker  Tobacco Use   Smoking status: Never    Passive exposure: Past   Smokeless tobacco: Never  Vaping Use   Vaping status: Never Used  Substance and Sexual Activity   Alcohol use: Yes    Comment: rare   Drug use: No    Sexual activity: Yes    Birth control/protection: Surgical  Other Topics Concern   Not on file  Social History Narrative   Not on file   Social Drivers of Health   Financial Resource Strain: Low Risk  (09/11/2023)   Overall Financial Resource Strain (CARDIA)    Difficulty of Paying Living Expenses: Not very hard  Food Insecurity: No Food Insecurity (09/11/2023)   Hunger Vital Sign    Worried About Running Out of Food in the Last Year: Never true    Ran Out of Food in the Last Year: Never true  Transportation Needs: No Transportation Needs (09/11/2023)   PRAPARE - Administrator, Civil Service (Medical): No    Lack of Transportation (Non-Medical): No  Physical Activity: Not on file  Stress: Stress Concern Present (09/11/2023)   [employer] of Occupational Health - Occupational Stress Questionnaire    Feeling of Stress: Very much  Social Connections: Moderately Isolated (09/11/2023)   Social Connection and Isolation Panel    Frequency of Communication with Friends and Family: More than three times a week    Frequency of Social Gatherings with Friends and Family: Once a week    Attends Religious Services: Patient declined    Database administrator or Organizations: No    Attends Engineer, structural: Not on file    Marital Status: Married  Catering manager Violence: Not on file    Current Outpatient Medications on File Prior to Visit  Medication Sig Dispense Refill   albuterol  (VENTOLIN  HFA) 108 (90 Base) MCG/ACT inhaler Inhale 2 puffs into the lungs every 4 (four) hours as needed for wheezing or shortness of breath. 8.5 g 1   amLODipine  (NORVASC ) 5 MG tablet Take 1 tablet (5 mg total) by mouth daily. 90 tablet 3   clobetasol  cream (TEMOVATE ) 0.05 % Apply 1 Application topically 2 (two) times daily. For up to 1-2 weeks then as needed. 30 g 0   COLLAGEN-BORON-HYALURONIC ACID PO Take by mouth.     cyanocobalamin  (VITAMIN B12) 500 MCG tablet Take 1 tablet (500  mcg total) by mouth daily. 90 tablet 0   DULoxetine  (CYMBALTA ) 60 MG capsule Take 1 capsule (60 mg total) by mouth daily. 90 capsule 0   EPINEPHrine  0.3 mg/0.3 mL IJ SOAJ injection 0.3 mg as needed.     estradiol  (ESTRACE ) 1 MG tablet Take 1 tablet (1 mg total) by mouth daily. 90 tablet 0   fluticasone  (FLONASE ) 50 MCG/ACT nasal spray Place into both nostrils as needed.     loratadine (CLARITIN) 10 MG tablet Take 10 mg by mouth daily as needed.  Misc Natural Products (OSTEO BI-FLEX ADV TRIPLE ST PO) Take by mouth.     Multiple Vitamins-Minerals (MULTIVITAMIN WOMEN PO) Take by mouth.     Probiotic Product (TRUBIOTICS) CAPS Take 1 capsule by mouth daily.     progesterone  (PROMETRIUM ) 200 MG capsule TAKE 1 CAPSULE BY MOUTH EVERY DAY 90 capsule 3   rizatriptan  (MAXALT -MLT) 10 MG disintegrating tablet TAKE 1 TABLET BY MOUTH AS NEEDED FOR MIGRAINE. MAY REPEAT IN 2 HOURS IF NEEDED. MAX DOSE IS 2 TABS IN 24 HOURS. 10 tablet 2   traZODone  (DESYREL ) 50 MG tablet Take 0.5-1 tablets (25-50 mg total) by mouth at bedtime as needed for sleep. 30 tablet 0   TURMERIC PO Take by mouth daily.     VITAMIN D  PO Take 1,000 Units by mouth. 6 days weekly     Vitamin D , Ergocalciferol , (DRISDOL ) 1.25 MG (50000 UNIT) CAPS capsule TAKE ONE CAPSULE BY MOUTH EVERY 14 DAYS 8 capsule 0   cyclobenzaprine  (FLEXERIL ) 10 MG tablet Take 1 tablet (10 mg total) by mouth at bedtime as needed for muscle spasms. (Patient not taking: Reported on 09/16/2023) 30 tablet 5   metroNIDAZOLE  (FLAGYL ) 500 MG tablet Take 1 tablet (500 mg total) by mouth 2 (two) times daily. (Patient not taking: Reported on 09/16/2023) 14 tablet 0   nystatin  cream (MYCOSTATIN ) Apply to affected area 2 times daily (Patient not taking: Reported on 09/16/2023) 30 g 0   omeprazole (PRILOSEC) 40 MG capsule Take 40 mg by mouth as needed. (Patient not taking: Reported on 09/16/2023)     ondansetron  (ZOFRAN ) 8 MG tablet Take 1 tablet (8 mg total) by mouth every 8 (eight)  hours as needed for nausea or vomiting. (Patient not taking: Reported on 09/16/2023) 20 tablet 0   No current facility-administered medications on file prior to visit.    Allergies  Allergen Reactions   Iodinated Contrast Media Hives    Other reaction(s): Flushing   Codeine Nausea Only    Other reaction(s): Dizziness, Vomiting      Review of Systems ROS Review of Systems - General ROS: negative for - chills, fatigue, fever, hot flashes, night sweats, weight gain or weight loss Psychological ROS: negative for - anxiety, decreased libido, depression, mood swings, physical abuse or sexual abuse Ophthalmic ROS: negative for - blurry vision, eye pain or loss of vision ENT ROS: negative for - headaches, hearing change, visual changes or vocal changes Allergy and Immunology ROS: negative for - hives, itchy/watery eyes or seasonal allergies Hematological and Lymphatic ROS: negative for - bleeding problems, bruising, swollen lymph nodes or weight loss Endocrine ROS: negative for - galactorrhea, hair pattern changes, hot flashes, malaise/lethargy, mood swings, palpitations, polydipsia/polyuria, skin changes, temperature intolerance or unexpected weight changes Breast ROS: negative for - new or changing breast lumps or nipple discharge Respiratory ROS: negative for - cough or shortness of breath Cardiovascular ROS: negative for - chest pain, irregular heartbeat, palpitations or shortness of breath Gastrointestinal ROS: no abdominal pain, change in bowel habits, or black or bloody stools Genito-Urinary ROS: no dysuria, trouble voiding, or hematuria Musculoskeletal ROS: negative for - joint pain or joint stiffness Neurological ROS: negative for - bowel and bladder control changes Dermatological ROS: negative for rash and skin lesion changes   Objective:   BP 133/72   Pulse 82   Ht 5' 8 (1.727 m)   Wt 298 lb 8 oz (135.4 kg)   LMP 05/26/2016 (Exact Date)   BMI 45.39 kg/m   She is  ambulating  and conversing normally. CONSTITUTIONAL: Well-developed, well-nourished female in no acute distress.  PSYCHIATRIC: Normal mood and affect. Normal behavior. Normal judgment and thought content. NEUROLGIC: Alert and oriented to person, place, and time. Normal muscle tone coordination. No cranial nerve deficit noted. HENT:  Normocephalic, atraumatic, External right and left ear normal. Oropharynx is clear and moist EYES: Conjunctivae and EOM are normal. Pupils are equal, round, and reactive to light. No scleral icterus.  NECK: Normal range of motion, supple, no masses.  Normal thyroid .  SKIN: Skin is warm and dry. No rash noted. Not diaphoretic. No erythema. No pallor. CARDIOVASCULAR: Normal heart rate noted, regular rhythm, no murmur. RESPIRATORY: Clear to auscultation bilaterally. Effort and breath sounds normal, no problems with respiration noted. BREASTS: Symmetric in size. No masses, skin changes, nipple drainage, or lymphadenopathy. ABDOMEN: Soft, normal bowel sounds, no distention noted.  No tenderness, rebound or guarding.  BLADDER: Normal PELVIC:  Bladder bladder distension noted  Urethra: normal appearing urethra with no masses, tenderness or lesions  Vulva: normal appearing vulva with no masses, tenderness or lesions  Vagina: normal appearing vagina with normal color and discharge, no lesions    Adnexa: non-palpable, nontender and no masses    MUSCULOSKELETAL: Normal range of motion. No tenderness.  No cyanosis, clubbing, or edema.  2+ distal pulses. LYMPHATIC: No Axillary, Supraclavicular, or Inguinal Adenopathy.  Labs: Lab Results  Component Value Date   WBC 10.5 02/14/2023   HGB 13.3 02/14/2023   HCT 39.6 02/14/2023   MCV 90.6 02/14/2023   PLT 401 (H) 02/14/2023    Lab Results  Component Value Date   CREATININE 0.77 02/14/2023   BUN 19 02/14/2023   NA 136 02/14/2023   K 3.8 02/14/2023   CL 100 02/14/2023   CO2 26 02/14/2023    Lab Results  Component  Value Date   ALT 9 11/29/2022   AST 11 11/29/2022   ALKPHOS 56 11/29/2022   BILITOT 0.2 11/29/2022    Lab Results  Component Value Date   CHOL 212 (H) 11/29/2022   HDL 42 11/29/2022   LDLCALC 143 (H) 11/29/2022   TRIG 149 11/29/2022   CHOLHDL 5.0 (H) 11/29/2022    Lab Results  Component Value Date   TSH 0.18 (L) 01/10/2023    Lab Results  Component Value Date   HGBA1C 5.2 03/21/2023     Assessment:   Well woman exam with gyn exam  Plan:  Pap: Not needed Mammogram:  , Ordered, Not Ordered, and Not Indicated  Routine preventative health maintenance measures emphasized: Exercise/Diet/Weight control  With regard to her HRT, she is willing to try just ERT.  Return to Clinic - 1 Year   Harland JAYSON Birkenhead, MD [facility]

## 2023-09-18 LAB — CERVICOVAGINAL ANCILLARY ONLY
Bacterial Vaginitis (gardnerella): NEGATIVE
Candida Glabrata: NEGATIVE
Candida Vaginitis: NEGATIVE
Comment: NEGATIVE
Comment: NEGATIVE
Comment: NEGATIVE
Comment: NEGATIVE
Trichomonas: NEGATIVE

## 2023-10-08 ENCOUNTER — Encounter (INDEPENDENT_AMBULATORY_CARE_PROVIDER_SITE_OTHER): Payer: Self-pay | Admitting: Adult Health

## 2023-10-08 ENCOUNTER — Ambulatory Visit (INDEPENDENT_AMBULATORY_CARE_PROVIDER_SITE_OTHER): Admitting: Adult Health

## 2023-10-08 VITALS — BP 137/83 | HR 65 | Temp 98.2°F | Ht 68.0 in | Wt 287.0 lb

## 2023-10-08 DIAGNOSIS — E538 Deficiency of other specified B group vitamins: Secondary | ICD-10-CM

## 2023-10-08 DIAGNOSIS — F329 Major depressive disorder, single episode, unspecified: Secondary | ICD-10-CM

## 2023-10-08 DIAGNOSIS — E782 Mixed hyperlipidemia: Secondary | ICD-10-CM | POA: Diagnosis not present

## 2023-10-08 DIAGNOSIS — E559 Vitamin D deficiency, unspecified: Secondary | ICD-10-CM

## 2023-10-08 DIAGNOSIS — E88819 Insulin resistance, unspecified: Secondary | ICD-10-CM

## 2023-10-08 DIAGNOSIS — Z6841 Body Mass Index (BMI) 40.0 and over, adult: Secondary | ICD-10-CM

## 2023-10-08 DIAGNOSIS — E079 Disorder of thyroid, unspecified: Secondary | ICD-10-CM

## 2023-10-08 MED ORDER — TRAZODONE HCL 50 MG PO TABS: 25.0000 mg | ORAL_TABLET | Freq: Every day | ORAL | 0 refills | Status: DC

## 2023-10-08 MED ORDER — DULOXETINE HCL 60 MG PO CPEP: 60.0000 mg | ORAL_CAPSULE | Freq: Every day | ORAL | 0 refills | Status: DC

## 2023-10-08 NOTE — Progress Notes (Signed)
 WEIGHT SUMMARY AND BIOMETRICS  Vitals Temp: 98.2 F (36.8 C) BP: 137/83 Pulse Rate: 65 SpO2: 97 %   Anthropometric Measurements Height: 5' 8 (1.727 m) Weight: 287 lb (130.2 kg) BMI (Calculated): 43.65 Weight at Last Visit: 285 lb Weight Lost Since Last Visit: 0 Weight Gained Since Last Visit: 2 lb Starting Weight: 277 lb Total Weight Loss (lbs): 0 lb (0 kg)   Body Composition  Body Fat %: 51.5 % Fat Mass (lbs): 148 lbs Muscle Mass (lbs): 132.2 lbs Total Body Water (lbs): 105.8 lbs Visceral Fat Rating : 16   Other Clinical Data Fasting: yes Labs: yes Today's Visit #: 38 Starting Date: 08/13/19    Chief Complaint:   OBESITY Caitlin Barrett is here to discuss her progress with her obesity treatment plan.  She is on the the Category 3 Plan and states she is following her eating plan approximately 80 % of the time.  She states she is exercising: NEAT Activities  Interim History:  Her husband was in a restrained MVC early May. Due to continued back and rib pain- he has been out of work since then. He is a Curator. Caitlin Barrett increased her work hours to increase family revenue. She will often work 10-12 hrs 5-7 days per week. She has been meal planning and prepping- taking food with her most days of the week.  Subjective:   1. Mixed hyperlipidemia Lipid Panel     Component Value Date/Time   CHOL 212 (H) 11/29/2022 0948   TRIG 149 11/29/2022 0948   HDL 42 11/29/2022 0948   CHOLHDL 5.0 (H) 11/29/2022 0948   CHOLHDL 4.8 10/23/2017 0830   VLDL 19 10/01/2016 0835   LDLCALC 143 (H) 11/29/2022 0948   LDLCALC 128 (H) 10/23/2017 0830   LABVLDL 27 11/29/2022 0948    She denies tobacco/vape use She is not on statin therapy She has an established Cardiologist  2. Insulin  resistance She was unable to tolerate Metformin . HWW and her various specialists have attempt GLP-1 and GIP/GLP-1 Rx- her insurance will not cover Cash Option Rx is cost prohibtive  3. B12  deficiency She is on oral B12 500 mcg- takes supplement daily   4. Vitamin D  deficiency She is on Ergocalciferol  Q14 days She denies N/V/Muscle Weakness  5. Thyroid  disease PCP last checked TSH He wanted to repeat labs- never completed. Will complete and forward to Dr. Edman   6. Major depressive disorder, remission status unspecified, unspecified whether recurrent Cymbalta  recently increased to 60mg  daily (on/about 08/13/2023) She endorses stable mood, denies SI/HI She is unsure if chronic pain has decreased with increase of the SNRI   Assessment/Plan:   1. Mixed hyperlipidemia Check Labs - Comprehensive metabolic panel with GFR - Lipid panel  2. Insulin  resistance (Primary) Check Labs - Hemoglobin A1c - Insulin , random  3. B12 deficiency Check Labs - Vitamin B12  4. Vitamin D  deficiency Check Labs - VITAMIN D  25 Hydroxy (Vit-D Deficiency, Fractures)  5. Thyroid  disease Check Labs - TSH + free T4 Forward to PCP  6. Major depressive disorder, remission status unspecified, unspecified whether recurrent Refill  7. Morbid obesity (HCC), Current BMI 43.7  Caitlin Barrett is currently in the action stage of change. As such, her goal is to continue with weight loss efforts. She has agreed to the Category 3 Plan.   Exercise goals: All adults should avoid inactivity. Some physical activity is better than none, and adults who participate in any amount of physical activity gain some health  benefits. Adults should also include muscle-strengthening activities that involve all major muscle groups on 2 or more days a week.  Behavioral modification strategies: increasing lean protein intake, decreasing simple carbohydrates, increasing vegetables, increasing water intake, no skipping meals, meal planning and cooking strategies, keeping healthy foods in the home, ways to avoid boredom eating, travel eating strategies, and planning for success.  Caitlin Barrett has agreed to follow-up with  our clinic in 4 weeks. She was informed of the importance of frequent follow-up visits to maximize her success with intensive lifestyle modifications for her multiple health conditions.   Caitlin Barrett was informed we would discuss her lab results at her next visit unless there is a critical issue that needs to be addressed sooner. Caitlin Barrett agreed to keep her next visit at the agreed upon time to discuss these results.  Objective:   Blood pressure 137/83, pulse 65, temperature 98.2 F (36.8 C), height 5' 8 (1.727 m), weight 287 lb (130.2 kg), last menstrual period 05/26/2016, SpO2 97%. Body mass index is 43.64 kg/m.  General: Cooperative, alert, well developed, in no acute distress. HEENT: Conjunctivae and lids unremarkable. Cardiovascular: Regular rhythm.  Lungs: Normal work of breathing. Neurologic: No focal deficits.   Lab Results  Component Value Date   CREATININE 0.77 02/14/2023   BUN 19 02/14/2023   NA 136 02/14/2023   K 3.8 02/14/2023   CL 100 02/14/2023   CO2 26 02/14/2023   Lab Results  Component Value Date   ALT 9 11/29/2022   AST 11 11/29/2022   ALKPHOS 56 11/29/2022   BILITOT 0.2 11/29/2022   Lab Results  Component Value Date   HGBA1C 5.2 03/21/2023   HGBA1C 5.4 11/29/2022   HGBA1C 5.2 08/28/2021   HGBA1C 5.3 05/03/2021   HGBA1C 5.3 12/29/2020   Lab Results  Component Value Date   INSULIN  9.3 03/21/2023   INSULIN  17.1 11/29/2022   INSULIN  10.7 08/28/2021   INSULIN  17.5 05/03/2021   INSULIN  10.1 12/29/2020   Lab Results  Component Value Date   TSH 0.18 (L) 01/10/2023   Lab Results  Component Value Date   CHOL 212 (H) 11/29/2022   HDL 42 11/29/2022   LDLCALC 143 (H) 11/29/2022   TRIG 149 11/29/2022   CHOLHDL 5.0 (H) 11/29/2022   Lab Results  Component Value Date   VD25OH 49.2 03/21/2023   VD25OH 70 01/10/2023   VD25OH 65.4 11/29/2022   Lab Results  Component Value Date   WBC 10.5 02/14/2023   HGB 13.3 02/14/2023   HCT 39.6 02/14/2023   MCV  90.6 02/14/2023   PLT 401 (H) 02/14/2023   Lab Results  Component Value Date   IRON 64 08/13/2019   TIBC 333 08/13/2019   FERRITIN 30 08/13/2019   Attestation Statements:   Reviewed by clinician on day of visit: allergies, medications, problem list, medical history, surgical history, family history, social history, and previous encounter notes.  I have reviewed the above documentation for accuracy and completeness, and I agree with the above. -  Raziah Funnell d. Christain Niznik, NP-C

## 2023-10-09 LAB — COMPREHENSIVE METABOLIC PANEL WITH GFR
ALT: 12 IU/L (ref 0–32)
AST: 11 IU/L (ref 0–40)
Albumin: 4.2 g/dL (ref 3.9–4.9)
Alkaline Phosphatase: 59 IU/L (ref 44–121)
BUN/Creatinine Ratio: 20 (ref 9–23)
BUN: 17 mg/dL (ref 6–24)
Bilirubin Total: 0.2 mg/dL (ref 0.0–1.2)
CO2: 21 mmol/L (ref 20–29)
Calcium: 9.3 mg/dL (ref 8.7–10.2)
Chloride: 101 mmol/L (ref 96–106)
Creatinine, Ser: 0.86 mg/dL (ref 0.57–1.00)
Globulin, Total: 2 g/dL (ref 1.5–4.5)
Glucose: 94 mg/dL (ref 70–99)
Potassium: 4.2 mmol/L (ref 3.5–5.2)
Sodium: 139 mmol/L (ref 134–144)
Total Protein: 6.2 g/dL (ref 6.0–8.5)
eGFR: 82 mL/min/1.73 (ref >59)

## 2023-10-09 LAB — HEMOGLOBIN A1C
Est. average glucose Bld gHb Est-mCnc: 103 mg/dL
Hgb A1c MFr Bld: 5.2 % (ref 4.8–5.6)

## 2023-10-09 LAB — TSH+FREE T4
Free T4: 0.72 ng/dL — ABNORMAL LOW (ref 0.82–1.77)
TSH: 0.532 u[IU]/mL (ref 0.450–4.500)

## 2023-10-09 LAB — LIPID PANEL
Chol/HDL Ratio: 5.3 ratio — ABNORMAL HIGH (ref 0.0–4.4)
Cholesterol, Total: 230 mg/dL — ABNORMAL HIGH (ref 100–199)
HDL: 43 mg/dL (ref >39)
LDL Chol Calc (NIH): 149 mg/dL — ABNORMAL HIGH (ref 0–99)
Triglycerides: 208 mg/dL — ABNORMAL HIGH (ref 0–149)
VLDL Cholesterol Cal: 38 mg/dL (ref 5–40)

## 2023-10-09 LAB — VITAMIN D 25 HYDROXY (VIT D DEFICIENCY, FRACTURES): Vit D, 25-Hydroxy: 30.2 ng/mL (ref 30.0–100.0)

## 2023-10-09 LAB — VITAMIN B12: Vitamin B-12: 241 pg/mL (ref 232–1245)

## 2023-10-09 LAB — INSULIN, RANDOM: INSULIN: 17.2 u[IU]/mL (ref 2.6–24.9)

## 2023-11-01 ENCOUNTER — Other Ambulatory Visit: Payer: Self-pay | Admitting: Family Medicine

## 2023-11-01 DIAGNOSIS — G43909 Migraine, unspecified, not intractable, without status migrainosus: Secondary | ICD-10-CM

## 2023-11-05 ENCOUNTER — Ambulatory Visit (INDEPENDENT_AMBULATORY_CARE_PROVIDER_SITE_OTHER): Admitting: Adult Health

## 2023-11-05 NOTE — Telephone Encounter (Signed)
 Requested Prescriptions  Pending Prescriptions Disp Refills   rizatriptan  (MAXALT -MLT) 10 MG disintegrating tablet [Pharmacy Med Name: RIZATRIPTAN  10 MG ODT] 10 tablet 2    Sig: TAKE 1 TABLET BY MOUTH AS NEEDED FOR MIGRAINE. MAY REPEAT IN 2 HOURS IF NEEDED. MAX DOSE IS 2 TABS IN 24 HOURS.     Neurology:  Migraine Therapy - Triptan Passed - 11/05/2023  8:26 AM      Passed - Last BP in normal range    BP Readings from Last 1 Encounters:  10/08/23 137/83         Passed - Valid encounter within last 12 months    Recent Outpatient Visits           1 month ago Essential hypertension   [facility] [place], Marsa PARAS, DO       Future Appointments             In 2 months Deveshwar, Maya, MD [facility] - A Dept Of [place]

## 2023-11-16 ENCOUNTER — Encounter

## 2023-12-09 ENCOUNTER — Encounter (INDEPENDENT_AMBULATORY_CARE_PROVIDER_SITE_OTHER): Payer: Self-pay | Admitting: Adult Health

## 2023-12-11 ENCOUNTER — Ambulatory Visit (INDEPENDENT_AMBULATORY_CARE_PROVIDER_SITE_OTHER): Admitting: Adult Health

## 2023-12-13 ENCOUNTER — Encounter: Payer: Self-pay | Admitting: Family Medicine

## 2023-12-13 ENCOUNTER — Ambulatory Visit: Admitting: Family Medicine

## 2023-12-13 VITALS — BP 124/78 | HR 78 | Ht 68.0 in | Wt 294.4 lb

## 2023-12-13 DIAGNOSIS — I10 Essential (primary) hypertension: Secondary | ICD-10-CM

## 2023-12-13 DIAGNOSIS — Z0000 Encounter for general adult medical examination without abnormal findings: Secondary | ICD-10-CM

## 2023-12-13 DIAGNOSIS — E538 Deficiency of other specified B group vitamins: Secondary | ICD-10-CM

## 2023-12-13 DIAGNOSIS — Z1211 Encounter for screening for malignant neoplasm of colon: Secondary | ICD-10-CM

## 2023-12-13 DIAGNOSIS — D126 Benign neoplasm of colon, unspecified: Secondary | ICD-10-CM

## 2023-12-13 MED ORDER — CYANOCOBALAMIN 1000 MCG/ML IJ SOLN: 1000.0000 ug | INTRAMUSCULAR | 0 refills | Status: DC

## 2023-12-13 NOTE — Patient Instructions (Addendum)
 Thank you for coming to the office today.  Let us  know when ready to schedule B12 injections, sent to pharmacy 1 x injection weekly x 4 weeks, then 1 x injection monthly x 4 months then we can re-check can stop oral B12 now  Referred back to [facility]   Schedule Colonoscopy after early November 2025  You can call to schedule Mammogram when ready  For Mammogram screening for breast cancer   Call the Imaging Center below anytime to schedule your own appointment now that order has been placed.  [facility] [address], [address] [place], [place] [address] Phone: [phone]  Future Shingles vaccine x 2  Future Pneumonia vaccine x 1  Future consider GLP1 medicine from Endocrine or if Sleep Apnea  Please schedule a Follow-up Appointment to: Return in about 6 months (around 06/11/2024) for 6 month follow-up HTN, B12/Anemia, ?GLP1.  If you have any other questions or concerns, please feel free to call the office or send a message through MyChart. You may also schedule an earlier appointment if necessary.  Additionally, you may be receiving a survey about your experience at our office within a few days to 1 week by e-mail or mail. We value your feedback.  Marsa Officer, DO [facility], [place]

## 2023-12-13 NOTE — Progress Notes (Signed)
 Subjective:    Patient ID: Caitlin Barrett, female    DOB: 10/07/1970, 53 y.o.   MRN: [id number]  Caitlin Barrett is a 53 y.o. female presenting on 12/13/2023 for Annual Exam   HPI  Discussed the use of AI scribe software for clinical note transcription with the patient, who gave verbal consent to proceed.  History of Present Illness   Caitlin Barrett is a 53 year old female who presents for an annual physical exam and follow-up after recent COVID-19 infection.  Post-acute covid-19 respiratory symptoms - Recent COVID-19 infection, tested negative on September 4th, approximately two weeks ago - Persistent cough, worse at night when lying down - Cough symptoms improving over time with use of evening cough medicine  Colorectal polyp surveillance - History of colon polyps - Colonoscopy in 2022 with resection of four polyps  Vitamin b12 deficiency - Low vitamin B12 level on July blood work - Currently taking over-the-counter B12 supplements - Previous B12 injections provided symptomatic improvement  Thyroid  nodular disease Followed by Endocrine - History of multinodular thyroid  disease - Under endocrinology surveillance - Not currently on thyroid  medication due to prior worsening of condition with therapy  Hyperlipidemia - Elevated LDL cholesterol, ranging from 130s to 160s - Cardiac CT in February showed no arterial buildup - Not currently on cholesterol-lowering medication   Health Maintenance:  Shingles Vaccine in future  Defer COVID vax  Mammogram ordered by GYN Dr Starla, [facility] she will schedule when ready 2025      12/13/2023    8:16 AM 09/16/2023    2:53 PM 09/11/2023    3:28 PM  Depression screen PHQ 2/9  Decreased Interest 2 1 2   Down, Depressed, Hopeless 2 1 2   PHQ - 2 Score 4 2 4   Altered sleeping 2 2 3   Tired, decreased energy 2 2 2   Change in appetite 2 2 2   Feeling bad or failure about yourself  1 1 1   Trouble concentrating 2 0 1  Moving  slowly or fidgety/restless 1 0 0  Suicidal thoughts 0 0 0  PHQ-9 Score 14 9 13   Difficult doing work/chores Somewhat difficult Somewhat difficult Somewhat difficult       12/13/2023    8:16 AM 09/11/2023    3:29 PM 03/12/2023    1:41 PM 09/10/2022    9:11 AM  GAD 7 : Generalized Anxiety Score  Nervous, Anxious, on Edge 2 2 2 2   Control/stop worrying 3 2 2 2   Worry too much - different things 3 3 2 2   Trouble relaxing 2 3 1 2   Restless 2 2 1 1   Easily annoyed or irritable 2 3 1 2   Afraid - awful might happen 1 2 1  0  Total GAD 7 Score 15 17 10 11   Anxiety Difficulty Somewhat difficult Somewhat difficult       Past Medical History:  Diagnosis Date   Allergy    seasonal.  Takes allergy shots at [facility]   Anemia    Anxiety    Arthritis    Asthma    exercise induced--no inhalers   Back pain    Cancer (HCC) 2010   Basal cell skin cancer Lt eye, skin graft above Lt eye   Constipation    Costochondritis    Depression    Dysmenorrhea    Eczema    Edema, lower extremity    GERD (gastroesophageal reflux disease)    Headache    Hypothyroid  Nodules and Fine needle aspiration   IBS (irritable bowel syndrome)    Lab test positive for detection of COVID-19 virus    November 2020   Myalgia    Osteoarthritis    Painful menstrual periods    Palpitations    Plantar fasciitis    Shortness of breath    Sprain of carpal (joint) of wrist    Vitamin B 12 deficiency    Vitamin D  deficiency    Past Surgical History:  Procedure Laterality Date   ABDOMINAL HYSTERECTOMY Bilateral 07/02/2016   Procedure: HYSTERECTOMY ABDOMINAL WITH BILATERAL SALPINGECTOMY-MIDLINE INCISION;  Surgeon: Gladis DELENA Dollar, MD;  Location: [facility] OR[facility];  Service: Gynecology;  Laterality: Bilateral;   CHOLECYSTECTOMY  2009   COLONOSCOPY WITH PROPOFOL  N/A 01/26/2021   Procedure: COLONOSCOPY WITH PROPOFOL ;  Surgeon: Unk Corinn Skiff, MD;  Location: [facility];  Service: Gastroenterology;  Laterality:  N/A;   fibromalga     HERNIA REPAIR  1974   umbilical   NASAL SEPTOPLASTY W/ TURBINOPLASTY Bilateral 03/03/2021   Procedure: NASAL SEPTOPLASTY WITH SUBMUCOSAL RESECTION OF TURBINATE;  Surgeon: Herminio Miu, MD;  Location: [facility];  Service: ENT;  Laterality: Bilateral;   SKIN GRAFT  2010   TONSILLECTOMY     Social History   Socioeconomic History   Marital status: Married    Spouse name: Not on file   Number of children: Not on file   Years of education: Not on file   Highest education level: Bachelor's degree (e.g., BA, AB, BS)  Occupational History   Occupation: pet sitter/dog walker  Tobacco Use   Smoking status: Never    Passive exposure: Past   Smokeless tobacco: Never  Vaping Use   Vaping status: Never Used  Substance and Sexual Activity   Alcohol use: Yes    Comment: rare   Drug use: No   Sexual activity: Yes    Birth control/protection: Surgical  Other Topics Concern   Not on file  Social History Narrative   Not on file   Social Drivers of Health   Financial Resource Strain: Low Risk  (09/11/2023)   Overall Financial Resource Strain (CARDIA)    Difficulty of Paying Living Expenses: Not very hard  Food Insecurity: No Food Insecurity (09/11/2023)   Hunger Vital Sign    Worried About Running Out of Food in the Last Year: Never true    Ran Out of Food in the Last Year: Never true  Transportation Needs: No Transportation Needs (09/11/2023)   PRAPARE - Administrator, Civil Service (Medical): No    Lack of Transportation (Non-Medical): No  Physical Activity: Not on file  Stress: Stress Concern Present (09/11/2023)   [employer] of Occupational Health - Occupational Stress Questionnaire    Feeling of Stress: Very much  Social Connections: Moderately Isolated (09/11/2023)   Social Connection and Isolation Panel    Frequency of Communication with Friends and Family: More than three times a week    Frequency of Social Gatherings with  Friends and Family: Once a week    Attends Religious Services: Patient declined    Database administrator or Organizations: No    Attends Engineer, structural: Not on file    Marital Status: Married  Catering manager Violence: Not on file   Family History  Problem Relation Age of Onset   Diabetes Mother    Hyperlipidemia Mother    Hypertension Mother    Sleep apnea Mother    Obesity Mother  Cancer Father        lung CA   Heart disease Maternal Grandmother    Stroke Maternal Grandmother    Hypertension Maternal Grandmother    Diabetes Maternal Grandmother    Heart disease Maternal Grandfather    Diabetes Maternal Grandfather    Heart attack Maternal Grandfather    Breast cancer Neg Hx    Current Outpatient Medications on File Prior to Visit  Medication Sig   albuterol  (VENTOLIN  HFA) 108 (90 Base) MCG/ACT inhaler Inhale 2 puffs into the lungs every 4 (four) hours as needed for wheezing or shortness of breath.   amLODipine  (NORVASC ) 5 MG tablet Take 1 tablet (5 mg total) by mouth daily.   clobetasol  cream (TEMOVATE ) 0.05 % Apply 1 Application topically 2 (two) times daily. For up to 1-2 weeks then as needed.   COLLAGEN-BORON-HYALURONIC ACID PO Take by mouth.   cyclobenzaprine  (FLEXERIL ) 10 MG tablet Take 1 tablet (10 mg total) by mouth at bedtime as needed for muscle spasms.   DULoxetine  (CYMBALTA ) 60 MG capsule Take 1 capsule (60 mg total) by mouth daily.   EPINEPHrine  0.3 mg/0.3 mL IJ SOAJ injection 0.3 mg as needed.   estradiol  (ESTRACE ) 1 MG tablet Take 1 tablet (1 mg total) by mouth daily.   fluticasone  (FLONASE ) 50 MCG/ACT nasal spray Place into both nostrils as needed.   loratadine (CLARITIN) 10 MG tablet Take 10 mg by mouth daily as needed.   Misc Natural Products (OSTEO BI-FLEX ADV TRIPLE ST PO) Take by mouth.   Multiple Vitamins-Minerals (MULTIVITAMIN WOMEN PO) Take by mouth.   omeprazole (PRILOSEC) 40 MG capsule Take 40 mg by mouth as needed.   Probiotic  Product (TRUBIOTICS) CAPS Take 1 capsule by mouth daily.   rizatriptan  (MAXALT -MLT) 10 MG disintegrating tablet TAKE 1 TABLET BY MOUTH AS NEEDED FOR MIGRAINE. MAY REPEAT IN 2 HOURS IF NEEDED. MAX DOSE IS 2 TABS IN 24 HOURS.   traZODone  (DESYREL ) 50 MG tablet Take 0.5-1 tablets (25-50 mg total) by mouth at bedtime.   TURMERIC PO Take by mouth daily.   VITAMIN D  PO Take 1,000 Units by mouth. 6 days weekly   Vitamin D , Ergocalciferol , (DRISDOL ) 1.25 MG (50000 UNIT) CAPS capsule TAKE ONE CAPSULE BY MOUTH EVERY 14 DAYS   No current facility-administered medications on file prior to visit.    Review of Systems  Constitutional:  Negative for activity change, appetite change, chills, diaphoresis, fatigue and fever.  HENT:  Negative for congestion and hearing loss.   Eyes:  Negative for visual disturbance.  Respiratory:  Negative for cough, chest tightness, shortness of breath and wheezing.   Cardiovascular:  Negative for chest pain, palpitations and leg swelling.  Gastrointestinal:  Negative for abdominal pain, constipation, diarrhea, nausea and vomiting.  Genitourinary:  Negative for dysuria, frequency and hematuria.  Musculoskeletal:  Negative for arthralgias and neck pain.  Skin:  Negative for rash.  Neurological:  Negative for dizziness, weakness, light-headedness, numbness and headaches.  Hematological:  Negative for adenopathy.  Psychiatric/Behavioral:  Negative for behavioral problems, dysphoric mood and sleep disturbance.    Per HPI unless specifically indicated above     Objective:    BP 124/78 (BP Location: Left Arm, Cuff Size: Large)   Pulse 78   Ht 5' 8 (1.727 m)   Wt 294 lb 6 oz (133.5 kg)   LMP 05/26/2016 (Exact Date)   SpO2 95%   BMI 44.76 kg/m   Wt Readings from Last 3 Encounters:  12/13/23 294 lb 6  oz (133.5 kg)  10/08/23 287 lb (130.2 kg)  09/16/23 298 lb 8 oz (135.4 kg)    Physical Exam Vitals and nursing note reviewed.  Constitutional:      General: She is  not in acute distress.    Appearance: She is well-developed. She is obese. She is not diaphoretic.     Comments: Well-appearing, comfortable, cooperative  HENT:     Head: Normocephalic and atraumatic.  Eyes:     General:        Right eye: No discharge.        Left eye: No discharge.     Conjunctiva/sclera: Conjunctivae normal.     Pupils: Pupils are equal, round, and reactive to light.  Neck:     Thyroid : No thyromegaly.  Cardiovascular:     Rate and Rhythm: Normal rate and regular rhythm.     Pulses: Normal pulses.     Heart sounds: Normal heart sounds. No murmur heard. Pulmonary:     Effort: Pulmonary effort is normal. No respiratory distress.     Breath sounds: Normal breath sounds. No wheezing or rales.  Abdominal:     General: Bowel sounds are normal. There is no distension.     Palpations: Abdomen is soft. There is no mass.     Tenderness: There is no abdominal tenderness.  Musculoskeletal:        General: No tenderness. Normal range of motion.     Cervical back: Normal range of motion and neck supple.     Comments: Upper / Lower Extremities: - Normal muscle tone, strength bilateral upper extremities 5/5, lower extremities 5/5  Lymphadenopathy:     Cervical: No cervical adenopathy.  Skin:    General: Skin is warm and dry.     Findings: No erythema or rash.  Neurological:     Mental Status: She is alert and oriented to person, place, and time.     Comments: Distal sensation intact to light touch all extremities  Psychiatric:        Mood and Affect: Mood normal.        Behavior: Behavior normal.        Thought Content: Thought content normal.     Comments: Well groomed, good eye contact, normal speech and thoughts     Results for orders placed or performed in visit on 10/08/23  Comprehensive metabolic panel with GFR   Collection Time: 10/08/23  7:58 AM  Result Value Ref Range   Glucose 94 70 - 99 mg/dL   BUN 17 6 - 24 mg/dL   Creatinine, Ser 9.13 0.57 - 1.00  mg/dL   eGFR 82 >40 fO/fpw/8.26   BUN/Creatinine Ratio 20 9 - 23   Sodium 139 134 - 144 mmol/L   Potassium 4.2 3.5 - 5.2 mmol/L   Chloride 101 96 - 106 mmol/L   CO2 21 20 - 29 mmol/L   Calcium 9.3 8.7 - 10.2 mg/dL   Total Protein 6.2 6.0 - 8.5 g/dL   Albumin 4.2 3.9 - 4.9 g/dL   Globulin, Total 2.0 1.5 - 4.5 g/dL   Bilirubin Total 0.2 0.0 - 1.2 mg/dL   Alkaline Phosphatase 59 44 - 121 IU/L   AST 11 0 - 40 IU/L   ALT 12 0 - 32 IU/L  Hemoglobin A1c   Collection Time: 10/08/23  7:58 AM  Result Value Ref Range   Hgb A1c MFr Bld 5.2 4.8 - 5.6 %   Est. average glucose Bld gHb Est-mCnc 103 mg/dL  Insulin , random   Collection Time: 10/08/23  7:58 AM  Result Value Ref Range   INSULIN  17.2 2.6 - 24.9 uIU/mL  VITAMIN D  25 Hydroxy (Vit-D Deficiency, Fractures)   Collection Time: 10/08/23  7:58 AM  Result Value Ref Range   Vit D, 25-Hydroxy 30.2 30.0 - 100.0 ng/mL  Vitamin B12   Collection Time: 10/08/23  7:58 AM  Result Value Ref Range   Vitamin B-12 241 232 - 1,245 pg/mL  TSH + free T4   Collection Time: 10/08/23  7:58 AM  Result Value Ref Range   TSH 0.532 0.450 - 4.500 uIU/mL   Free T4 0.72 (L) 0.82 - 1.77 ng/dL  Lipid panel   Collection Time: 10/08/23  7:58 AM  Result Value Ref Range   Cholesterol, Total 230 (H) 100 - 199 mg/dL   Triglycerides 791 (H) 0 - 149 mg/dL   HDL 43 >60 mg/dL   VLDL Cholesterol Cal 38 5 - 40 mg/dL   LDL Chol Calc (NIH) 850 (H) 0 - 99 mg/dL   Chol/HDL Ratio 5.3 (H) 0.0 - 4.4 ratio      Assessment & Plan:   Problem List Items Addressed This Visit     Colon cancer screening   Relevant Orders   Ambulatory referral to Gastroenterology   Essential hypertension   Polyp of colon   Relevant Orders   Ambulatory referral to Gastroenterology   Other Visit Diagnoses       Annual physical exam    -  Primary     B12 deficiency       Relevant Medications   cyanocobalamin  (VITAMIN B12) 1000 MCG/ML injection        Updated Health Maintenance  information Reviewed recent lab results with patient Encouraged improvement to lifestyle with diet and exercise Goal of weight loss   Residual cough post-COVID IMPROVING. Residual cough persists post-COVID, worse at night. Over-the-counter cough medicine provides relief. Expected to resolve with time. - Continue over-the-counter cough medicine as needed. - Delay flu and shingles vaccinations until cough resolves.  Hypertension Blood pressure slightly elevated due to cough medicine, normalized after rest. - Monitor blood pressure. - Renew amlodipine  prescription in December.  Vitamin B12 deficiency Vitamin B12 levels borderline despite oral supplements. No significant improvement noted. - Order B12 injections: one shot per week for four weeks, then one shot per month for four months. - Instruct to stop oral B12 supplements. - Schedule B12 injections with nurse. - Recheck B12 levels after injection course.  Obesity Weight increased post-COVID. Discussed GLP-1 agonists for weight management. Concerns about cost and side effects. Limited insurance coverage. - Consider GLP-1 agonists for weight management.  Hyperlipidemia LDL cholesterol elevated, but heart scan shows low cardiovascular risk. No treatment at this time.  General Health Maintenance Discussed routine vaccinations and screenings. Advised to delay vaccinations until cough resolves. Reviewed heart scan results. Discussed potential GLP-1 agonist use. - Consider future shingles vaccine (2 doses) and pneumonia vaccine (1 dose) after cough resolves. - Schedule mammogram at [facility] for [facility]. - Refer to [facility] for colonoscopy scheduling after early November 2025.          Orders Placed This Encounter  Procedures   Ambulatory referral to Gastroenterology    Referral Priority:   Routine    Referral Type:   Consultation    Referral Reason:   Specialty Services Required    Number of Visits Requested:   1     Meds ordered this encounter  Medications  cyanocobalamin  (VITAMIN B12) 1000 MCG/ML injection    Sig: Inject 1 mL (1,000 mcg total) into the muscle once a week. For 4 weeks    Dispense:  4 mL    Refill:  0     Follow up plan: Return in about 6 months (around 06/11/2024) for 6 month follow-up HTN, B12/Anemia, ?GLP1.  Marsa Officer, DO [facility] 12/13/2023, 8:25 AM

## 2023-12-25 ENCOUNTER — Ambulatory Visit (INDEPENDENT_AMBULATORY_CARE_PROVIDER_SITE_OTHER): Admitting: Adult Health

## 2023-12-25 ENCOUNTER — Encounter (INDEPENDENT_AMBULATORY_CARE_PROVIDER_SITE_OTHER): Payer: Self-pay | Admitting: Adult Health

## 2023-12-25 VITALS — BP 138/75 | HR 80 | Ht 68.0 in | Wt 290.0 lb

## 2023-12-25 DIAGNOSIS — E559 Vitamin D deficiency, unspecified: Secondary | ICD-10-CM

## 2023-12-25 DIAGNOSIS — F5105 Insomnia due to other mental disorder: Secondary | ICD-10-CM

## 2023-12-25 DIAGNOSIS — G4700 Insomnia, unspecified: Secondary | ICD-10-CM

## 2023-12-25 DIAGNOSIS — E782 Mixed hyperlipidemia: Secondary | ICD-10-CM

## 2023-12-25 DIAGNOSIS — E538 Deficiency of other specified B group vitamins: Secondary | ICD-10-CM | POA: Diagnosis not present

## 2023-12-25 DIAGNOSIS — E88819 Insulin resistance, unspecified: Secondary | ICD-10-CM | POA: Diagnosis not present

## 2023-12-25 DIAGNOSIS — Z6841 Body Mass Index (BMI) 40.0 and over, adult: Secondary | ICD-10-CM

## 2023-12-25 DIAGNOSIS — F329 Major depressive disorder, single episode, unspecified: Secondary | ICD-10-CM

## 2023-12-25 MED ORDER — DULOXETINE HCL 60 MG PO CPEP: 60.0000 mg | ORAL_CAPSULE | Freq: Every day | ORAL | 0 refills | Status: DC

## 2023-12-25 MED ORDER — VITAMIN D (ERGOCALCIFEROL) 1.25 MG (50000 UNIT) PO CAPS: ORAL_CAPSULE | ORAL | 0 refills | Status: DC

## 2023-12-25 MED ORDER — TRAZODONE HCL 50 MG PO TABS: 25.0000 mg | ORAL_TABLET | Freq: Every day | ORAL | 0 refills | Status: DC

## 2023-12-25 NOTE — Progress Notes (Addendum)
 WEIGHT SUMMARY AND BIOMETRICS  Vitals BP: 138/75 Pulse Rate: 80 SpO2: 97 %   Anthropometric Measurements Height: 5' 8 (1.727 m) Weight: 290 lb (131.5 kg) BMI (Calculated): 44.1 Weight at Last Visit: 287lb Weight Lost Since Last Visit: 0lb Weight Gained Since Last Visit: 3lb Starting Weight: 277lb Total Weight Loss (lbs): 13 lb (5.897 kg)   Body Composition  Body Fat %: 52.8 % Fat Mass (lbs): 153.2 lbs Muscle Mass (lbs): 130 lbs Visceral Fat Rating : 17   Other Clinical Data RMR: 1530 Fasting: No Labs: no Today's Visit #: 55 Starting Date: 08/13/19    Chief Complaint:   OBESITY Caitlin Barrett is here to discuss her progress with her obesity treatment plan.  She is on the the Category 3 Plan and states she is following her eating plan approximately 75 % of the time.  She states she is exercising Walking 30 minutes 4 times per week.  Interim History:  12/13/2023 Chronic F/u with PCP Labs not completed as her last set at [facility] was July 2025 Adjustments to B12 supplementation.  HWW has attempted GLP-1 and GIP/GLP-1 therapy coverage twice- both denied. Her established Endocrinologist/ Dr. Cherilyn  has attempted GLP-1 and GIP/GLP-1 therapy coverage twice- both denied. Dr. Cherilyn has sent in Rx via [employer] option Zepbound  5mg  vial She will start once she receives the shipment and will administer 2.5mg  once weekly for 4 weeks. If she tolerates well, then she will increase to 5mg  once weekly at 5 weeks. She understands to inject 1/2 vial for the firs month to equal the loading dose of 2.5mg   Subjective:   1. B12 deficiency PCP started her on B12 injections Once she starts injection therapy, she will stop oral therapy  2. Insulin  resistance She denies family hx of MENS 2 or MTC She denies personal hx of pancreatitis.  HWW has attempted GLP-1 and GIP/GLP-1 therapy coverage twice- both denied. Her established Endocrinologist/ Dr. Cherilyn  has  attempted GLP-1 and GIP/GLP-1 therapy coverage twice- both denied. Dr. Cherilyn has sent in Rx via [employer] option Zepbound  5mg  vial She will start once she receives the shipment and will administer 2.5mg  once weekly for 4 weeks. If she tolerates well, then she will increase to 5mg  once weekly at 5 weeks. She understands to inject 1/2 vial for the firs month to equal the loading dose of 2.5mg   3. Mixed HLD She denies tobacco/vape use She denies recent CP She continues to experience non productive cough s/p COVID-19 infection  4. Vitamin D  deficiency She continues to experience fatigue/daytime drowsiness. Her dentist recently referred her for a Sleep Study  5. Insomnia PDMP reviewed- no aberrancies noted She will consistently use PRN Trazodone  50mg  nightly for insomnia. She will often retire to bed at 2000, wake at 0300 for nocturia, then can return to sleep until 0630. She denies morning sedation.  6. Major depressive disorder, remission status unspecified, unspecified whether recurrent She reports stable mood, denies SI/HI Cymbalta  60 mg daily is utilized for treatment of depression and chronic, diffuse pain.  Assessment/Plan:   1. B12 deficiency (Primary) Stop oral supplementation when you start B12 injections  2. Insulin  resistance Continue healthy eating and regular walking. Start Zepbound  per Endocrinology  3. Mixed HLD Continue healthy eating and regular walking. Start Zepbound  per Endocrinology  4. Vitamin D  deficiency Refill []   Vitamin D , Ergocalciferol , (DRISDOL ) 1.25 MG (50000 UNIT) CAPS capsule TAKE ONE CAPSULE BY MOUTH EVERY 14 DAYS Dispense: 8 capsule, Refills:  0 ordered   5. Insomnia Refill traZODone  (DESYREL ) 50 MG tablet Take 0.5-1 tablets (25-50 mg total) by mouth at bedtime. Dispense: 90 tablet, Refills: 0 ordered   6. Major depressive disorder, remission status unspecified, unspecified whether recurrent Refill  DULoxetine  (CYMBALTA ) 60 MG  capsule Take 1 capsule (60 mg total) by mouth daily. Dispense: 90 capsule, Refills: 0 ordered   7. Morbid obesity (HCC), Current BMI 44.1  Caitlin Barrett is currently in the action stage of change. As such, her goal is to continue with weight loss efforts. She has agreed to the Category 3 Plan.   Exercise goals: All adults should avoid inactivity. Some physical activity is better than none, and adults who participate in any amount of physical activity gain some health benefits. Adults should also include muscle-strengthening activities that involve all major muscle groups on 2 or more days a week. Continue to increase regular walking duration and intensity.  Behavioral modification strategies: increasing lean protein intake, decreasing simple carbohydrates, increasing vegetables, increasing water intake, meal planning and cooking strategies, keeping healthy foods in the home, ways to avoid boredom eating, and planning for success.  [name] has agreed to follow-up with our clinic in 4 weeks. She was informed of the importance of frequent follow-up visits to maximize her success with intensive lifestyle modifications for her multiple health conditions.   Objective:   Blood pressure 138/75, pulse 80, height 5' 8 (1.727 m), weight 290 lb (131.5 kg), last menstrual period 05/26/2016, SpO2 97%. Body mass index is 44.09 kg/m.  General: Cooperative, alert, well developed, in no acute distress. HEENT: Conjunctivae and lids unremarkable. Cardiovascular: Regular rhythm.  Lungs: Normal work of breathing. Neurologic: No focal deficits.   Lab Results  Component Value Date   CREATININE 0.86 10/08/2023   BUN 17 10/08/2023   NA 139 10/08/2023   K 4.2 10/08/2023   CL 101 10/08/2023   CO2 21 10/08/2023   Lab Results  Component Value Date   ALT 12 10/08/2023   AST 11 10/08/2023   ALKPHOS 59 10/08/2023   BILITOT 0.2 10/08/2023   Lab Results  Component Value Date   HGBA1C 5.2 10/08/2023   HGBA1C 5.2  03/21/2023   HGBA1C 5.4 11/29/2022   HGBA1C 5.2 08/28/2021   HGBA1C 5.3 05/03/2021   Lab Results  Component Value Date   INSULIN  17.2 10/08/2023   INSULIN  9.3 03/21/2023   INSULIN  17.1 11/29/2022   INSULIN  10.7 08/28/2021   INSULIN  17.5 05/03/2021   Lab Results  Component Value Date   TSH 0.532 10/08/2023   Lab Results  Component Value Date   CHOL 230 (H) 10/08/2023   HDL 43 10/08/2023   LDLCALC 149 (H) 10/08/2023   TRIG 208 (H) 10/08/2023   CHOLHDL 5.3 (H) 10/08/2023   Lab Results  Component Value Date   VD25OH 30.2 10/08/2023   VD25OH 49.2 03/21/2023   VD25OH 70 01/10/2023   Lab Results  Component Value Date   WBC 10.5 02/14/2023   HGB 13.3 02/14/2023   HCT 39.6 02/14/2023   MCV 90.6 02/14/2023   PLT 401 (H) 02/14/2023   Lab Results  Component Value Date   IRON 64 08/13/2019   TIBC 333 08/13/2019   FERRITIN 30 08/13/2019    Attestation Statements:   Reviewed by clinician on day of visit: allergies, medications, problem list, medical history, surgical history, family history, social history, and previous encounter notes.  I have reviewed the above documentation for accuracy and completeness, and I agree with the above. -  Debby Clyne d. Niki Payment, NP-C

## 2024-01-10 ENCOUNTER — Other Ambulatory Visit: Payer: Self-pay | Admitting: Family Medicine

## 2024-01-10 DIAGNOSIS — E538 Deficiency of other specified B group vitamins: Secondary | ICD-10-CM

## 2024-01-13 NOTE — Telephone Encounter (Signed)
 Requested medications are due for refill today.  yes  Requested medications are on the active medications list.  yes  Last refill. 12/13/2023 4mL 0 rf  Future visit scheduled.   yes  Notes to clinic.  Labs about to expire. New medication to this pt.    Requested Prescriptions  Pending Prescriptions Disp Refills   cyanocobalamin  (VITAMIN B12) 1000 MCG/ML injection [Pharmacy Med Name: CYANOCOBALAMIN  1,000 MCG/ML VL] 12 mL 1    Sig: Inject 1 mL (1,000 mcg total) into the muscle once a week. For 4 weeks     Endocrinology:  Vitamins - Vitamin B12 Passed - 01/13/2024  2:16 PM      Passed - HCT in normal range and within 360 days    HCT  Date Value Ref Range Status  02/14/2023 39.6 36.0 - 46.0 % Final   Hematocrit  Date Value Ref Range Status  03/01/2022 37.7 34.0 - 46.6 % Final         Passed - HGB in normal range and within 360 days    Hemoglobin  Date Value Ref Range Status  02/14/2023 13.3 12.0 - 15.0 g/dL Final  87/92/7976 86.9 11.1 - 15.9 g/dL Final         Passed - B12 Level in normal range and within 360 days    Vitamin B-12  Date Value Ref Range Status  10/08/2023 241 232 - 1,245 pg/mL Final         Passed - Valid encounter within last 12 months    Recent Outpatient Visits           1 month ago Annual physical exam   [facility] [place], Marsa PARAS, DO   4 months ago Essential hypertension   [facility] Ed[place], Marsa PARAS, DO       Future Appointments             In 1 week Dolphus Reiter, MD [facility] Rheumatology - A Dept Of [place]. [facility]

## 2024-01-14 NOTE — Progress Notes (Deleted)
 Office Visit Note  Patient: Caitlin Barrett             Date of Birth: 01-05-1971           MRN: [id number]             PCP: Edman Marsa PARAS, DO Referring: Edman Marsa * Visit Date: 01/23/2024 Occupation: Data Unavailable  Subjective:  No chief complaint on file.   History of Present Illness: Caitlin Barrett is a 53 y.o. female ***     Activities of Daily Living:  Patient reports morning stiffness for *** {minute/hour:19697}.   Patient {ACTIONS;DENIES/REPORTS:21021675::Denies} nocturnal pain.  Difficulty dressing/grooming: {ACTIONS;DENIES/REPORTS:21021675::Denies} Difficulty climbing stairs: {ACTIONS;DENIES/REPORTS:21021675::Denies} Difficulty getting out of chair: {ACTIONS;DENIES/REPORTS:21021675::Denies} Difficulty using hands for taps, buttons, cutlery, and/or writing: {ACTIONS;DENIES/REPORTS:21021675::Denies}  No Rheumatology ROS completed.   PMFS History:  Patient Active Problem List   Diagnosis Date Noted   BMI 39.0-39.9,adult-current bmi 39.4 06/20/2022   Morbid obesity (HCC)-start bmi 40.91 06/20/2022   Depression 08/11/2021   Bilateral lower extremity edema 08/06/2021   Status post nasal septoplasty 03/30/2021   Colon cancer screening    Polyp of colon    Depressed mood 12/01/2020   Nausea without vomiting 11/07/2020   Thyroid  disease 06/09/2020   Cervical strain 04/25/2020   Fatigue 04/04/2020   GAD (generalized anxiety disorder) 02/08/2020   Pericardial pain 01/28/2020   Muscle soreness 01/28/2020   Mixed hyperlipidemia 11/25/2019   Essential hypertension 11/25/2019   Polyphagia 10/08/2019   Vitamin D  deficiency 09/17/2019   Insulin  resistance 09/17/2019   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   Class 3 severe obesity with serious comorbidity and body mass index (BMI) of 40.0 to 44.9 in adult (HCC) 09/17/2019   Exercise-induced asthma 04/22/2018   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   Monilial vaginitis 02/26/2017   Decreased  libido 02/26/2017   Major depressive disorder, recurrent, in full remission 02/26/2017   Dyspareunia, female 02/26/2017   S/P TAH (total abdominal hysterectomy) 07/02/2016   Nevus of abdominal wall 06/11/2016   Morton's neuralgia 07/21/2015   Nocturia 07/21/2015   Stress incontinence 07/21/2015   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  Takes allergy shots at [facility]   Anemia    Anxiety    Arthritis    Asthma    exercise induced--no inhalers   Back pain    Cancer (HCC) 2010   Basal cell skin cancer Lt eye, skin graft above Lt eye   Constipation    Costochondritis    Depression    Dysmenorrhea    Eczema    Edema, lower extremity    GERD (gastroesophageal reflux disease)    Headache    Hypothyroid    Nodules and Fine needle aspiration   IBS (irritable bowel syndrome)    Lab test positive for detection of COVID-19 virus    November 2020   Myalgia    Osteoarthritis    Painful menstrual periods    Palpitations    Plantar fasciitis    Shortness of breath    Sprain of carpal (joint) of wrist    Vitamin B 12 deficiency    Vitamin D  deficiency     Family History  Problem Relation Age of Onset   Diabetes Mother    Hyperlipidemia Mother    Hypertension Mother    Sleep apnea Mother    Obesity Mother  Cancer Father        lung CA   Heart disease Maternal Grandmother    Stroke Maternal Grandmother    Hypertension Maternal Grandmother    Diabetes Maternal Grandmother    Heart disease Maternal Grandfather    Diabetes Maternal Grandfather    Heart attack Maternal Grandfather    Breast cancer Neg Hx    Past Surgical History:  Procedure Laterality Date   ABDOMINAL HYSTERECTOMY Bilateral 07/02/2016   Procedure: HYSTERECTOMY ABDOMINAL WITH BILATERAL SALPINGECTOMY-MIDLINE INCISION;  Surgeon: Gladis DELENA Dollar, MD;  Location: [facility] ORS;   Service: Gynecology;  Laterality: Bilateral;   CHOLECYSTECTOMY  2009   COLONOSCOPY WITH PROPOFOL  N/A 01/26/2021   Procedure: COLONOSCOPY WITH PROPOFOL ;  Surgeon: Unk Corinn Skiff, MD;  Location: [facility] ENDOSCOPY;  Service: Gastroenterology;  Laterality: N/A;   fibromalga     HERNIA REPAIR  1974   umbilical   NASAL SEPTOPLASTY W/ TURBINOPLASTY Bilateral 03/03/2021   Procedure: NASAL SEPTOPLASTY WITH SUBMUCOSAL RESECTION OF TURBINATE;  Surgeon: Herminio Miu, MD;  Location: [facility];  Service: ENT;  Laterality: Bilateral;   SKIN GRAFT  2010   TONSILLECTOMY     Social History   Tobacco Use   Smoking status: Never    Passive exposure: Past   Smokeless tobacco: Never  Vaping Use   Vaping status: Never Used  Substance Use Topics   Alcohol use: Yes    Comment: rare   Drug use: No   Social History   Social History Narrative   Not on file     Immunization History  Administered Date(s) Administered   Influenza Inj Mdck Quad Pf 01/29/2019   Influenza Nasal 01/16/2018   Influenza-Unspecified 01/25/2020   Tdap 06/14/2017     Objective: Vital Signs: LMP 05/26/2016 (Exact Date)    Physical Exam   Musculoskeletal Exam: ***  CDAI Exam: CDAI Score: -- Patient Global: --; Provider Global: -- Swollen: --; Tender: -- Joint Exam 01/23/2024   No joint exam has been documented for this visit   There is currently no information documented on the homunculus. Go to the Rheumatology activity and complete the homunculus joint exam.  Investigation: No additional findings.  Imaging: No results found.  Recent Labs: Lab Results  Component Value Date   WBC 10.5 02/14/2023   HGB 13.3 02/14/2023   PLT 401 (H) 02/14/2023   NA 139 10/08/2023   K 4.2 10/08/2023   CL 101 10/08/2023   CO2 21 10/08/2023   GLUCOSE 94 10/08/2023   BUN 17 10/08/2023   CREATININE 0.86 10/08/2023   BILITOT 0.2 10/08/2023   ALKPHOS 59 10/08/2023   AST 11 10/08/2023   ALT 12 10/08/2023    PROT 6.2 10/08/2023   ALBUMIN 4.2 10/08/2023   CALCIUM 9.3 10/08/2023   GFRAA 89 03/31/2020    Speciality Comments: No specialty comments available.  Procedures:  No procedures performed Allergies: Iodinated contrast media and Codeine   Assessment / Plan:     Visit Diagnoses: No diagnosis found.  Orders: No orders of the defined types were placed in this encounter.  No orders of the defined types were placed in this encounter.   Face-to-face time spent with patient was *** minutes. Greater than 50% of time was spent in counseling and coordination of care.  Follow-Up Instructions: No follow-ups on file.   Daved JAYSON Gavel, CMA  Note - This record has been created using Animal nutritionist.  Chart creation errors have been sought, but may not always  have been located.  Such creation errors do not reflect on  the standard of medical care.

## 2024-01-23 ENCOUNTER — Other Ambulatory Visit (INDEPENDENT_AMBULATORY_CARE_PROVIDER_SITE_OTHER): Payer: Self-pay | Admitting: Adult Health

## 2024-01-23 ENCOUNTER — Ambulatory Visit: Admitting: Rheumatology

## 2024-01-23 DIAGNOSIS — Z840 Family history of diseases of the skin and subcutaneous tissue: Secondary | ICD-10-CM

## 2024-01-23 DIAGNOSIS — M7061 Trochanteric bursitis, right hip: Secondary | ICD-10-CM

## 2024-01-23 DIAGNOSIS — G8929 Other chronic pain: Secondary | ICD-10-CM

## 2024-01-23 DIAGNOSIS — M6289 Other specified disorders of muscle: Secondary | ICD-10-CM

## 2024-01-23 DIAGNOSIS — M7918 Myalgia, other site: Secondary | ICD-10-CM

## 2024-01-23 DIAGNOSIS — F418 Other specified anxiety disorders: Secondary | ICD-10-CM

## 2024-01-23 DIAGNOSIS — M545 Low back pain, unspecified: Secondary | ICD-10-CM

## 2024-01-23 DIAGNOSIS — J45990 Exercise induced bronchospasm: Secondary | ICD-10-CM

## 2024-01-23 DIAGNOSIS — M62838 Other muscle spasm: Secondary | ICD-10-CM

## 2024-01-23 DIAGNOSIS — M1712 Unilateral primary osteoarthritis, left knee: Secondary | ICD-10-CM

## 2024-01-23 DIAGNOSIS — C44319 Basal cell carcinoma of skin of other parts of face: Secondary | ICD-10-CM

## 2024-01-23 DIAGNOSIS — E559 Vitamin D deficiency, unspecified: Secondary | ICD-10-CM

## 2024-01-23 DIAGNOSIS — Z8719 Personal history of other diseases of the digestive system: Secondary | ICD-10-CM

## 2024-01-23 DIAGNOSIS — E038 Other specified hypothyroidism: Secondary | ICD-10-CM

## 2024-01-28 ENCOUNTER — Encounter (INDEPENDENT_AMBULATORY_CARE_PROVIDER_SITE_OTHER): Payer: Self-pay | Admitting: Adult Health

## 2024-01-28 ENCOUNTER — Ambulatory Visit (INDEPENDENT_AMBULATORY_CARE_PROVIDER_SITE_OTHER): Payer: Self-pay | Admitting: Adult Health

## 2024-01-28 VITALS — BP 136/76 | HR 82 | Ht 68.0 in | Wt 281.0 lb

## 2024-01-28 DIAGNOSIS — F5105 Insomnia due to other mental disorder: Secondary | ICD-10-CM

## 2024-01-28 DIAGNOSIS — F99 Mental disorder, not otherwise specified: Secondary | ICD-10-CM

## 2024-01-28 DIAGNOSIS — E782 Mixed hyperlipidemia: Secondary | ICD-10-CM

## 2024-01-28 DIAGNOSIS — E88819 Insulin resistance, unspecified: Secondary | ICD-10-CM

## 2024-01-28 DIAGNOSIS — K5900 Constipation, unspecified: Secondary | ICD-10-CM

## 2024-01-28 DIAGNOSIS — Z6841 Body Mass Index (BMI) 40.0 and over, adult: Secondary | ICD-10-CM

## 2024-01-28 DIAGNOSIS — E559 Vitamin D deficiency, unspecified: Secondary | ICD-10-CM

## 2024-01-28 DIAGNOSIS — F329 Major depressive disorder, single episode, unspecified: Secondary | ICD-10-CM

## 2024-01-28 DIAGNOSIS — K5903 Drug induced constipation: Secondary | ICD-10-CM

## 2024-01-28 MED ORDER — DULOXETINE HCL 60 MG PO CPEP: 60.0000 mg | ORAL_CAPSULE | Freq: Every day | ORAL | 0 refills | Status: DC

## 2024-01-28 MED ORDER — TRAZODONE HCL 50 MG PO TABS: 25.0000 mg | ORAL_TABLET | Freq: Every day | ORAL | 0 refills | Status: DC

## 2024-01-28 MED ORDER — VITAMIN D (ERGOCALCIFEROL) 1.25 MG (50000 UNIT) PO CAPS: ORAL_CAPSULE | ORAL | 0 refills | Status: DC

## 2024-01-28 NOTE — Progress Notes (Signed)
 WEIGHT SUMMARY AND BIOMETRICS  Vitals BP: 136/76 Pulse Rate: 82 SpO2: 99 %   Anthropometric Measurements Height: 5' 8 (1.727 m) Weight: 281 lb (127.5 kg) BMI (Calculated): 42.74 Weight at Last Visit: 290lb Weight Lost Since Last Visit: 9lb Weight Gained Since Last Visit: 0lb Starting Weight: 277lb Total Weight Loss (lbs): 0 lb (0 kg)   Body Composition  Body Fat %: 50.6 % Fat Mass (lbs): 142.6 lbs Muscle Mass (lbs): 132.2 lbs Total Body Water (lbs): 101.8 lbs Visceral Fat Rating : 16   Other Clinical Data RMR: 1530 Fasting: No Labs: No Today's Visit #: 63 Starting Date: 08/13/19    Chief Complaint:   OBESITY Caitlin Barrett is here to discuss her progress with her obesity treatment plan.  She is on the the Category 3 Plan and states she is following her eating plan approximately 80 % of the time.  She states she is exercising Walking 30 minutes 3 times per week.  Interim History:   HWW has attempted GLP-1 and GIP/GLP-1 therapy coverage twice- both denied. Her established Endocrinologist/ Dr. Cherilyn  has attempted GLP-1 and GIP/GLP-1 therapy coverage twice- both denied.  Dr. Cherilyn has sent in Rx via [employer] option Zepbound  5mg  vial She will start once she receives the shipment and will administer 2.5mg  once weekly for 4 weeks. First dose of 2.5mg  Zepbound  was 12/28/2023 Fifth dose of 2.5mg  Zepbound  on 01/25/2024 She has been experiencing constipation and intermittent, lower abdominal pain. Pain will improve after BM She denies hematochezia or N/V She denies dysphagia, dyspepsia, or difficulty swallowing.  Reviewed Bioimpedance Results with pt: Muscle Mass: +2.2 lbs Adipose Mass: -10.6 lbs  Subjective:   Vit D Def  Latest Reference Range & Units 01/10/23 15:49 03/21/23 11:42 10/08/23 07:58  Vitamin D , 25-Hydroxy 30.0 - 100.0 ng/mL 70 49.2 30.2   She is on bi-weekly Ergocalciferol - denies N/V/Muscle Weakness  2. Constipation First dose  of 2.5mg  Zepbound  was 12/28/2023 Fifth dose of 2.5mg  Zepbound  on 01/25/2024 She has been experiencing constipation and intermittent, lower abdominal pain. Pain will improve after BM She denies hematochezia or N/V She denies dysphagia, dyspepsia, or difficulty swallowing.  Last Colonoscopy 01/26/2022, recommended to REPEAT STUDY 01/2024- DUE NOW  3. Insomnia due to other mental disorder She reports quality sleep with PRN Trazodone  50mg  She will use 1/2 to 1 tab nightly, denies morning sedation  4. Major depressive disorder, remission status unspecified, unspecified whether recurrent She reports stable mood and reduciton in diffuse pain levels. She denies SI/HI She is on daily Cymbalta  60mg   Assessment/Plan:   1. Vit D Def Refill  Vitamin D , Ergocalciferol , (DRISDOL ) 1.25 MG (50000 UNIT) CAPS capsule TAKE ONE CAPSULE BY MOUTH EVERY 14 DAYS Dispense: 8 capsule, Refills: 0 ordered   2. Constipation Increase water and fiber Start OTC Colace and OTC Miralax- follow per [employer] instructions F/u with Unk Corinn Skiff, MD   3. Insomnia due to other mental disorder Refill traZODone  (DESYREL ) 50 MG tablet Take 0.5-1 tablets (25-50 mg total) by mouth at bedtime. Dispense: 90 tablet, Refills: 0 ordered   Increase regular walking  4. Major depressive disorder, remission status unspecified, unspecified whether recurrent Refill  DULoxetine  (CYMBALTA ) 60 MG capsule Take 1 capsule (60 mg total) by mouth daily. Dispense: 90 capsule, Refills: 0 ordered   Increase regular walking  5. Morbid obesity (HCC), Current BMI 42.8 F/u with established Endocrinologist for management of Zepbound  therapy  [name] is currently in the action stage of change. As such,  her goal is to continue with weight loss efforts. She has agreed to the Category 3 Plan.   Exercise goals: For substantial health benefits, adults should do at least 150 minutes (2 hours and 30 minutes) a week of moderate-intensity, or  75 minutes (1 hour and 15 minutes) a week of vigorous-intensity aerobic physical activity, or an equivalent combination of moderate- and vigorous-intensity aerobic activity. Aerobic activity should be performed in episodes of at least 10 minutes, and preferably, it should be spread throughout the week.  Behavioral modification strategies: increasing lean protein intake, decreasing simple carbohydrates, increasing vegetables, increasing water intake, no skipping meals, meal planning and cooking strategies, keeping healthy foods in the home, ways to avoid boredom eating, and planning for success.  [name] has agreed to follow-up with our clinic in 4 weeks. She was informed of the importance of frequent follow-up visits to maximize her success with intensive lifestyle modifications for her multiple health conditions.   Check Fasting Labs winter 2026  Objective:   Blood pressure 136/76, pulse 82, height 5' 8 (1.727 m), weight 281 lb (127.5 kg), last menstrual period 05/26/2016, SpO2 99%. Body mass index is 42.73 kg/m.  General: Cooperative, alert, well developed, in no acute distress. HEENT: Conjunctivae and lids unremarkable. Cardiovascular: Regular rhythm.  Lungs: Normal work of breathing. Neurologic: No focal deficits.   Lab Results  Component Value Date   CREATININE 0.86 10/08/2023   BUN 17 10/08/2023   NA 139 10/08/2023   K 4.2 10/08/2023   CL 101 10/08/2023   CO2 21 10/08/2023   Lab Results  Component Value Date   ALT 12 10/08/2023   AST 11 10/08/2023   ALKPHOS 59 10/08/2023   BILITOT 0.2 10/08/2023   Lab Results  Component Value Date   HGBA1C 5.2 10/08/2023   HGBA1C 5.2 03/21/2023   HGBA1C 5.4 11/29/2022   HGBA1C 5.2 08/28/2021   HGBA1C 5.3 05/03/2021   Lab Results  Component Value Date   INSULIN  17.2 10/08/2023   INSULIN  9.3 03/21/2023   INSULIN  17.1 11/29/2022   INSULIN  10.7 08/28/2021   INSULIN  17.5 05/03/2021   Lab Results  Component Value Date   TSH  0.532 10/08/2023   Lab Results  Component Value Date   CHOL 230 (H) 10/08/2023   HDL 43 10/08/2023   LDLCALC 149 (H) 10/08/2023   TRIG 208 (H) 10/08/2023   CHOLHDL 5.3 (H) 10/08/2023   Lab Results  Component Value Date   VD25OH 30.2 10/08/2023   VD25OH 49.2 03/21/2023   VD25OH 70 01/10/2023   Lab Results  Component Value Date   WBC 10.5 02/14/2023   HGB 13.3 02/14/2023   HCT 39.6 02/14/2023   MCV 90.6 02/14/2023   PLT 401 (H) 02/14/2023   Lab Results  Component Value Date   IRON 64 08/13/2019   TIBC 333 08/13/2019   FERRITIN 30 08/13/2019   Attestation Statements:   Reviewed by clinician on day of visit: allergies, medications, problem list, medical history, surgical history, family history, social history, and previous encounter notes  I have reviewed the above documentation for accuracy and completeness, and I agree with the above. -  Khaylee Mcevoy d. Yohanna Tow, NP-C

## 2024-02-04 ENCOUNTER — Telehealth: Payer: Self-pay

## 2024-02-04 NOTE — Telephone Encounter (Signed)
 Returned phone call to patient to schedule her colonoscopy.  LVM for her to call me back.  Last colonoscopy performed 01/26/21 with Dr. Unk.  Thanks,  [place], CMA

## 2024-02-05 NOTE — Telephone Encounter (Signed)
 LVM for pt to return my call.   Thanks,  [place], [place]

## 2024-02-07 NOTE — Telephone Encounter (Signed)
 Pt returned phone call yesterday to schedule her colonoscopy.  Returned phone call this morning-lvm letting her know I called her back and she can call back at her convenience.  Thanks,  [place], CMA

## 2024-02-17 ENCOUNTER — Telehealth: Payer: Self-pay | Admitting: Family Medicine

## 2024-02-17 NOTE — Telephone Encounter (Signed)
 Could you try to reach patient as well to let her know [facility] is trying to schedule her for Colonoscpy but they cannot get in touch with her? They have been trying to call and sent message / letter to her.  [facility] ([facility])  [address] - [address] [place], [place] [address] Phone: [phone]  Thank you!  Marsa Officer, DO [facility] 02/17/2024, 1:29 PM

## 2024-02-18 ENCOUNTER — Other Ambulatory Visit: Payer: Self-pay | Admitting: Family Medicine

## 2024-02-18 DIAGNOSIS — G43909 Migraine, unspecified, not intractable, without status migrainosus: Secondary | ICD-10-CM

## 2024-02-18 NOTE — Telephone Encounter (Signed)
 Requested Prescriptions  Pending Prescriptions Disp Refills   rizatriptan  (MAXALT -MLT) 10 MG disintegrating tablet [Pharmacy Med Name: RIZATRIPTAN  10 MG ODT] 10 tablet 2    Sig: TAKE 1 TABLET BY MOUTH AS NEEDED FOR MIGRAINE. MAY REPEAT IN 2 HOURS IF NEEDED. MAX DOSE IS 2 TABS IN 24 HOURS.     Neurology:  Migraine Therapy - Triptan Passed - 02/18/2024  5:28 PM      Passed - Last BP in normal range    BP Readings from Last 1 Encounters:  01/28/24 136/76         Passed - Valid encounter within last 12 months    Recent Outpatient Visits           2 months ago Annual physical exam   [facility] [place], Marsa PARAS, DO   5 months ago Essential hypertension   [facility] Edman, Marsa PARAS, DO       Future Appointments             In 4 weeks Dolphus Reiter, MD [facility] Rheumatology - A Dept Of [place]. [facility]

## 2024-02-24 ENCOUNTER — Encounter (INDEPENDENT_AMBULATORY_CARE_PROVIDER_SITE_OTHER): Payer: Self-pay | Admitting: Adult Health

## 2024-02-25 ENCOUNTER — Ambulatory Visit (INDEPENDENT_AMBULATORY_CARE_PROVIDER_SITE_OTHER): Admitting: Adult Health

## 2024-02-25 ENCOUNTER — Encounter (INDEPENDENT_AMBULATORY_CARE_PROVIDER_SITE_OTHER): Payer: Self-pay | Admitting: Adult Health

## 2024-02-25 VITALS — BP 138/83 | HR 80 | Ht 68.0 in | Wt 279.0 lb

## 2024-02-25 DIAGNOSIS — E88819 Insulin resistance, unspecified: Secondary | ICD-10-CM

## 2024-02-25 DIAGNOSIS — K5903 Drug induced constipation: Secondary | ICD-10-CM

## 2024-02-25 DIAGNOSIS — E559 Vitamin D deficiency, unspecified: Secondary | ICD-10-CM | POA: Diagnosis not present

## 2024-02-25 DIAGNOSIS — Z6841 Body Mass Index (BMI) 40.0 and over, adult: Secondary | ICD-10-CM

## 2024-02-25 DIAGNOSIS — F329 Major depressive disorder, single episode, unspecified: Secondary | ICD-10-CM | POA: Diagnosis not present

## 2024-02-25 MED ORDER — VITAMIN D (ERGOCALCIFEROL) 1.25 MG (50000 UNIT) PO CAPS: ORAL_CAPSULE | ORAL | 0 refills | Status: DC

## 2024-02-25 MED ORDER — DULOXETINE HCL 60 MG PO CPEP: 60.0000 mg | ORAL_CAPSULE | Freq: Every day | ORAL | 0 refills | Status: DC

## 2024-02-25 NOTE — Progress Notes (Unsigned)
 WEIGHT SUMMARY AND BIOMETRICS  Vitals BP: 138/83 Pulse Rate: 80 SpO2: 99 %   Anthropometric Measurements Height: 5' 8 (1.727 m) Weight: 279 lb (126.6 kg) BMI (Calculated): 42.43 Weight at Last Visit: 281lb Weight Lost Since Last Visit: 2lb Weight Gained Since Last Visit: 0lb Starting Weight: 277lb Total Weight Loss (lbs): 0 lb (0 kg)   Body Composition  Body Fat %: 50.9 % Fat Mass (lbs): 142.4 lbs Muscle Mass (lbs): 130.2 lbs Total Body Water (lbs): 103 lbs Visceral Fat Rating : 16   Other Clinical Data RMR: 1530 Fasting: No Labs: No Today's Visit #: 32 Starting Date: 08/13/19    Chief Complaint:   OBESITY Caitlin Barrett is here to discuss her progress with her obesity treatment plan.  She is on the the Category 3 Plan and states she is following her eating plan approximately 80 % of the time.  She states she is exercising: NEAT Actitivities  Interim History:  In the last month: She and her husband suffered GI upset eating at local restuanrt- breaded chicken. Ms. Crumble experienced nausea without vomiting and profuse diarrhea from 17-15 Feb 2024 She feel at a client's home 1.5 weeks ago- injured R ankle/R knee She has not been medically evaluated for these injuries. Her twin sister was dx'd with uterine cancer- scheduled for hysterectomy Jan 2026  She has been on weekly Zepbound  2.5mg  (via 5mg  vial) for > 8 weeks She reports tolerating well and is frsurasted with slow weight loss.  Encouraged to increase dose to 5mg  at next administration this Friday 02/28/2024  Current Weight 279 lbs Goal Weight < 200 lbs  Happy Note- They recently adopted a new dog, 28- 20 yr old Chihuahua  Subjective:   1. Vitamin D  deficiency  Latest Reference Range & Units 01/10/23 15:49 03/21/23 11:42 10/08/23 07:58  Vitamin D , 25-Hydroxy 30.0 - 100.0 ng/mL 70 49.2 30.2   She is on daily OTC Vit D3 and bi-weekly Ergocalciferol   2. Insulin  resistance She has been on  weekly Zepbound  2.5mg  (via 5mg  vial) for > 8 weeks She reports tolerating well and is frsurasted with slow weight loss.  Encouraged to increase dose to 5mg  at next administration this Friday 02/28/2024  3. Drug-induced constipation She reports more regular BMs with increased water and daily OTC Colace  4. MDD She endorses stable mood, denies SI/HI She denies increase in chronic pain She is on daily Cymbalta  60mg   Assessment/Plan:   1. Vitamin D  deficiency (Primary) Refill  Vitamin D , Ergocalciferol , (DRISDOL ) 1.25 MG (50000 UNIT) CAPS capsule TAKE ONE CAPSULE BY MOUTH EVERY 14 DAYS Dispense: 8 capsule, Refills: 0 ordered   2. Insulin  resistance Increase Zepbound  from 2.5mg  to 5mg  Monitor for SE Continue Cat 3 MP  3. Drug-induced constipation Continue to increase fluids, fiber, and OTC Colace Continue Cat 3 MP  4. MDD Refill  DULoxetine  (CYMBALTA ) 60 MG capsule Take 1 capsule (60 mg total) by mouth daily. Dispense: 90 capsule, Refills: 0 ordered  Continue Cat 3 MP  5. Morbid obesity (HCC), Current BMI 42.5  Shenell is currently in the action stage of change. As such, her goal is to continue with weight loss efforts. She has agreed to the Category 3 Plan.   Exercise goals: All adults should avoid inactivity. Some physical activity is better than none, and adults who participate in any amount of physical activity gain some health benefits. Adults should also include muscle-strengthening activities that involve all major muscle groups on 2 or more days  a week.  Behavioral modification strategies: increasing lean protein intake, decreasing simple carbohydrates, increasing vegetables, increasing water intake, meal planning and cooking strategies, keeping healthy foods in the home, ways to avoid boredom eating, and planning for success.  Kishia has agreed to follow-up with our clinic in 4 weeks. She was informed of the importance of frequent follow-up visits to maximize her  success with intensive lifestyle modifications for her multiple health conditions.   Objective:   Blood pressure 138/83, pulse 80, height 5' 8 (1.727 m), weight 279 lb (126.6 kg), last menstrual period 05/26/2016, SpO2 99%. Body mass index is 42.42 kg/m.  General: Cooperative, alert, well developed, in no acute distress. HEENT: Conjunctivae and lids unremarkable. Cardiovascular: Regular rhythm.  Lungs: Normal work of breathing. Neurologic: No focal deficits.   Lab Results  Component Value Date   CREATININE 0.86 10/08/2023   BUN 17 10/08/2023   NA 139 10/08/2023   K 4.2 10/08/2023   CL 101 10/08/2023   CO2 21 10/08/2023   Lab Results  Component Value Date   ALT 12 10/08/2023   AST 11 10/08/2023   ALKPHOS 59 10/08/2023   BILITOT 0.2 10/08/2023   Lab Results  Component Value Date   HGBA1C 5.2 10/08/2023   HGBA1C 5.2 03/21/2023   HGBA1C 5.4 11/29/2022   HGBA1C 5.2 08/28/2021   HGBA1C 5.3 05/03/2021   Lab Results  Component Value Date   INSULIN  17.2 10/08/2023   INSULIN  9.3 03/21/2023   INSULIN  17.1 11/29/2022   INSULIN  10.7 08/28/2021   INSULIN  17.5 05/03/2021   Lab Results  Component Value Date   TSH 0.532 10/08/2023   Lab Results  Component Value Date   CHOL 230 (H) 10/08/2023   HDL 43 10/08/2023   LDLCALC 149 (H) 10/08/2023   TRIG 208 (H) 10/08/2023   CHOLHDL 5.3 (H) 10/08/2023   Lab Results  Component Value Date   VD25OH 30.2 10/08/2023   VD25OH 49.2 03/21/2023   VD25OH 70 01/10/2023   Lab Results  Component Value Date   WBC 10.5 02/14/2023   HGB 13.3 02/14/2023   HCT 39.6 02/14/2023   MCV 90.6 02/14/2023   PLT 401 (H) 02/14/2023   Lab Results  Component Value Date   IRON 64 08/13/2019   TIBC 333 08/13/2019   FERRITIN 30 08/13/2019   Attestation Statements:   Reviewed by clinician on day of visit: allergies, medications, problem list, medical history, surgical history, family history, social history, and previous encounter notes.  I  have reviewed the above documentation for accuracy and completeness, and I agree with the above. -  [place] d. Alioune Hodgkin, NP-C

## 2024-03-03 NOTE — Progress Notes (Deleted)
 Office Visit Note  Patient: Caitlin Barrett             Date of Birth: April 09, 1970           MRN: [id number]             PCP: Edman Marsa PARAS, DO Referring: Edman Marsa * Visit Date: 03/17/2024 Occupation: Data Unavailable  Subjective:  No chief complaint on file.   History of Present Illness: Caitlin Barrett is a 53 y.o. female ***     Activities of Daily Living:  Patient reports morning stiffness for *** {minute/hour:19697}.   Patient {ACTIONS;DENIES/REPORTS:21021675::Denies} nocturnal pain.  Difficulty dressing/grooming: {ACTIONS;DENIES/REPORTS:21021675::Denies} Difficulty climbing stairs: {ACTIONS;DENIES/REPORTS:21021675::Denies} Difficulty getting out of chair: {ACTIONS;DENIES/REPORTS:21021675::Denies} Difficulty using hands for taps, buttons, cutlery, and/or writing: {ACTIONS;DENIES/REPORTS:21021675::Denies}  No Rheumatology ROS completed.   PMFS History:  Patient Active Problem List   Diagnosis Date Noted   BMI 39.0-39.9,adult-current bmi 39.4 06/20/2022   Morbid obesity (HCC)-start bmi 40.91 06/20/2022   Depression 08/11/2021   Bilateral lower extremity edema 08/06/2021   Status post nasal septoplasty 03/30/2021   Colon cancer screening    Polyp of colon    Depressed mood 12/01/2020   Nausea without vomiting 11/07/2020   Thyroid  disease 06/09/2020   Cervical strain 04/25/2020   Fatigue 04/04/2020   GAD (generalized anxiety disorder) 02/08/2020   Pericardial pain 01/28/2020   Muscle soreness 01/28/2020   Mixed hyperlipidemia 11/25/2019   Essential hypertension 11/25/2019   Polyphagia 10/08/2019   Vitamin D  deficiency 09/17/2019   Insulin  resistance 09/17/2019   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   Class 3 severe obesity with serious comorbidity and body mass index (BMI) of 40.0 to 44.9 in adult (HCC) 09/17/2019   Exercise-induced asthma 04/22/2018   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   Monilial vaginitis 02/26/2017   Decreased  libido 02/26/2017   Major depressive disorder, recurrent, in full remission 02/26/2017   Dyspareunia, female 02/26/2017   S/P TAH (total abdominal hysterectomy) 07/02/2016   Nevus of abdominal wall 06/11/2016   Morton's neuralgia 07/21/2015   Nocturia 07/21/2015   Stress incontinence 07/21/2015   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  Takes allergy shots at [facility]   Anemia    Anxiety    Arthritis    Asthma    exercise induced--no inhalers   Back pain    Cancer (HCC) 2010   Basal cell skin cancer Lt eye, skin graft above Lt eye   Constipation    Costochondritis    Depression    Dysmenorrhea    Eczema    Edema, lower extremity    GERD (gastroesophageal reflux disease)    Headache    Hypothyroid    Nodules and Fine needle aspiration   IBS (irritable bowel syndrome)    Lab test positive for detection of COVID-19 virus    November 2020   Myalgia    Osteoarthritis    Painful menstrual periods    Palpitations    Plantar fasciitis    Shortness of breath    Sprain of carpal (joint) of wrist    Vitamin B 12 deficiency    Vitamin D  deficiency     Family History  Problem Relation Age of Onset   Diabetes Mother    Hyperlipidemia Mother    Hypertension Mother    Sleep apnea Mother    Obesity Mother  Cancer Father        lung CA   Heart disease Maternal Grandmother    Stroke Maternal Grandmother    Hypertension Maternal Grandmother    Diabetes Maternal Grandmother    Heart disease Maternal Grandfather    Diabetes Maternal Grandfather    Heart attack Maternal Grandfather    Breast cancer Neg Hx    Past Surgical History:  Procedure Laterality Date   ABDOMINAL HYSTERECTOMY Bilateral 07/02/2016   Procedure: HYSTERECTOMY ABDOMINAL WITH BILATERAL SALPINGECTOMY-MIDLINE INCISION;  Surgeon: Gladis DELENA Dollar, MD;  Location: [facility];   Service: Gynecology;  Laterality: Bilateral;   CHOLECYSTECTOMY  2009   COLONOSCOPY WITH PROPOFOL  N/A 01/26/2021   Procedure: COLONOSCOPY WITH PROPOFOL ;  Surgeon: Unk Corinn Skiff, MD;  Location: [facility];  Service: Gastroenterology;  Laterality: N/A;   fibromalga     HERNIA REPAIR  1974   umbilical   NASAL SEPTOPLASTY W/ TURBINOPLASTY Bilateral 03/03/2021   Procedure: NASAL SEPTOPLASTY WITH SUBMUCOSAL RESECTION OF TURBINATE;  Surgeon: Herminio Miu, MD;  Location: [facility];  Service: ENT;  Laterality: Bilateral;   SKIN GRAFT  2010   TONSILLECTOMY     Social History   Tobacco Use   Smoking status: Never    Passive exposure: Past   Smokeless tobacco: Never  Vaping Use   Vaping status: Never Used  Substance Use Topics   Alcohol use: Yes    Comment: rare   Drug use: No   Social History   Social History Narrative   Not on file     Immunization History  Administered Date(s) Administered   Influenza Inj Mdck Quad Pf 01/29/2019   Influenza Nasal 01/16/2018   Influenza-Unspecified 01/25/2020   Tdap 06/14/2017     Objective: Vital Signs: LMP 05/26/2016 (Exact Date)    Physical Exam   Musculoskeletal Exam: ***  CDAI Exam: CDAI Score: -- Patient Global: --; Provider Global: -- Swollen: --; Tender: -- Joint Exam 03/17/2024   No joint exam has been documented for this visit   There is currently no information documented on the homunculus. Go to the Rheumatology activity and complete the homunculus joint exam.  Investigation: No additional findings.  Imaging: No results found.  Recent Labs: Lab Results  Component Value Date   WBC 10.5 02/14/2023   HGB 13.3 02/14/2023   PLT 401 (H) 02/14/2023   NA 139 10/08/2023   K 4.2 10/08/2023   CL 101 10/08/2023   CO2 21 10/08/2023   GLUCOSE 94 10/08/2023   BUN 17 10/08/2023   CREATININE 0.86 10/08/2023   BILITOT 0.2 10/08/2023   ALKPHOS 59 10/08/2023   AST 11 10/08/2023   ALT 12 10/08/2023    PROT 6.2 10/08/2023   ALBUMIN 4.2 10/08/2023   CALCIUM 9.3 10/08/2023   GFRAA 89 03/31/2020    Speciality Comments: No specialty comments available.  Procedures:  No procedures performed Allergies: Iodinated contrast media and Codeine   Assessment / Plan:     Visit Diagnoses: No diagnosis found.  Orders: No orders of the defined types were placed in this encounter.  No orders of the defined types were placed in this encounter.   Face-to-face time spent with patient was *** minutes. Greater than 50% of time was spent in counseling and coordination of care.  Follow-Up Instructions: No follow-ups on file.   Daved JAYSON Gavel, CMA  Note - This record has been created using Animal nutritionist.  Chart creation errors have been sought, but may not always  have been located.  Such creation errors do not reflect on  the standard of medical care.

## 2024-03-17 ENCOUNTER — Ambulatory Visit: Admitting: Rheumatology

## 2024-03-17 DIAGNOSIS — M7918 Myalgia, other site: Secondary | ICD-10-CM

## 2024-03-17 DIAGNOSIS — M62838 Other muscle spasm: Secondary | ICD-10-CM

## 2024-03-17 DIAGNOSIS — F418 Other specified anxiety disorders: Secondary | ICD-10-CM

## 2024-03-17 DIAGNOSIS — M545 Low back pain, unspecified: Secondary | ICD-10-CM

## 2024-03-17 DIAGNOSIS — J45990 Exercise induced bronchospasm: Secondary | ICD-10-CM

## 2024-03-17 DIAGNOSIS — E038 Other specified hypothyroidism: Secondary | ICD-10-CM

## 2024-03-17 DIAGNOSIS — Z8719 Personal history of other diseases of the digestive system: Secondary | ICD-10-CM

## 2024-03-17 DIAGNOSIS — E559 Vitamin D deficiency, unspecified: Secondary | ICD-10-CM

## 2024-03-17 DIAGNOSIS — M1712 Unilateral primary osteoarthritis, left knee: Secondary | ICD-10-CM

## 2024-03-17 DIAGNOSIS — Z840 Family history of diseases of the skin and subcutaneous tissue: Secondary | ICD-10-CM

## 2024-03-17 DIAGNOSIS — M7061 Trochanteric bursitis, right hip: Secondary | ICD-10-CM

## 2024-03-17 DIAGNOSIS — M6289 Other specified disorders of muscle: Secondary | ICD-10-CM

## 2024-03-17 DIAGNOSIS — C44319 Basal cell carcinoma of skin of other parts of face: Secondary | ICD-10-CM

## 2024-03-17 DIAGNOSIS — G8929 Other chronic pain: Secondary | ICD-10-CM

## 2024-03-31 ENCOUNTER — Encounter (INDEPENDENT_AMBULATORY_CARE_PROVIDER_SITE_OTHER): Payer: Self-pay | Admitting: Adult Health

## 2024-03-31 ENCOUNTER — Telehealth (INDEPENDENT_AMBULATORY_CARE_PROVIDER_SITE_OTHER): Admitting: Adult Health

## 2024-03-31 VITALS — BP 138/83 | HR 80 | Ht 68.0 in | Wt 269.0 lb

## 2024-03-31 DIAGNOSIS — E559 Vitamin D deficiency, unspecified: Secondary | ICD-10-CM | POA: Diagnosis not present

## 2024-03-31 DIAGNOSIS — G4709 Other insomnia: Secondary | ICD-10-CM

## 2024-03-31 DIAGNOSIS — E88819 Insulin resistance, unspecified: Secondary | ICD-10-CM

## 2024-03-31 DIAGNOSIS — F329 Major depressive disorder, single episode, unspecified: Secondary | ICD-10-CM | POA: Diagnosis not present

## 2024-03-31 DIAGNOSIS — Z6841 Body Mass Index (BMI) 40.0 and over, adult: Secondary | ICD-10-CM

## 2024-03-31 MED ORDER — DULOXETINE HCL 60 MG PO CPEP: 60.0000 mg | ORAL_CAPSULE | Freq: Every day | ORAL | 0 refills | Status: DC

## 2024-03-31 MED ORDER — VITAMIN D (ERGOCALCIFEROL) 1.25 MG (50000 UNIT) PO CAPS: ORAL_CAPSULE | ORAL | 0 refills | Status: DC

## 2024-03-31 MED ORDER — TRAZODONE HCL 50 MG PO TABS: 25.0000 mg | ORAL_TABLET | Freq: Every day | ORAL | 0 refills | Status: DC

## 2024-03-31 NOTE — Progress Notes (Signed)
 "    WEIGHT SUMMARY AND BIOMETRICS  Vitals BP: 138/83 Pulse Rate: 80 SpO2: 99 %   Anthropometric Measurements Height: 5' 8 (1.727 m) Weight: 269 lb (122 kg) BMI (Calculated): 40.91 Weight at Last Visit: 279lb Starting Weight: 277lb   No data recorded Other Clinical Data RMR: 1530 Fasting: No Labs: No Today's Visit #: 92 Starting Date: 08/13/19    Chief Complaint:  I connected with  Caitlin Barrett on 03/31/2024 by a video and audio enabled telemedicine application and verified that I am speaking with the correct person using two identifiers.  Patient Location: Home  Provider Location: Office/Clinic  Persons Participating in Visit: Patient.  I discussed the limitations of evaluation and management by telemedicine. The patient expressed understanding and agreed to proceed.   Vital Signs: Because this visit was a virtual/telehealth visit, some criteria may be missing or patient reported. Any vitals not documented were not able to be obtained and vitals that have been documented are patient reported.  OBESITY Caitlin Barrett is here to discuss her progress with her obesity treatment plan. She is on the the Category 3 Plan and states she is following her eating plan approximately 75 % of the time.  She states she is exercising: None while acutely ill.  Interim History:  Endocrinology is managing weekly Zepbound , she recently increased to 5mg - administered on 03/21/24 and 03/28/24 Denies mass in neck, dysphagia, dyspepsia, persistent hoarseness, abdominal pain.  She is currently developed N/V/D the last 48 hrs. She does not feel that this is r/t to weekly GIP/GLP-1 therapy. She is also experiencing fatigue, HA, and malaise. She denies fever, cough, or dyspnea. She has not tested for Influenza or COVID-19 She has not received her Influenza vaccine this season.  Subjective:   1. Insulin  resistance Endocrinology is managing weekly Zepbound , she recently increased to 5mg -  administered on 03/21/24 and 03/28/24 Denies mass in neck, dysphagia, dyspepsia, persistent hoarseness, abdominal pain.  2. Vitamin D  deficiency She endorses increased fatigue since GI upset began approximately 48 Hrs ago  3. Other insomnia She utilizes Trazodone  1/2 to 1 tab most nights of the week. She denies morning sedation when she uses sleep aid  4. Major depressive disorder, remission status unspecified, unspecified whether recurrent She endorses stable mood, denies SI/HI She reports chronic pain is stable She is on daily Cymbalta  60mg   Assessment/Plan:   1. Insulin  resistance Follow bland diet while acutely ill with GI upset. Once feeling better, resume   2. Vitamin D  deficiency (Primary) Refill - Vitamin D , Ergocalciferol , (DRISDOL ) 1.25 MG (50000 UNIT) CAPS capsule; TAKE ONE CAPSULE BY MOUTH EVERY 14 DAYS  Dispense: 8 capsule; Refill: 0  3. Other insomnia Refill - traZODone  (DESYREL ) 50 MG tablet; Take 0.5-1 tablets (25-50 mg total) by mouth at bedtime.  Dispense: 90 tablet; Refill: 0 Practice Good Sleep Hygiene  4. Major depressive disorder, remission status unspecified, unspecified whether recurrent Refill - DULoxetine  (CYMBALTA ) 60 MG capsule; Take 1 capsule (60 mg total) by mouth daily.  Dispense: 90 capsule; Refill: 0  5. Morbid obesity (HCC), Current BMI 42.5  Caitlin Barrett is currently in the action stage of change. As such, her goal is to continue with weight loss efforts. She has agreed to the Category 3 Plan.   Exercise goals: No exercise has been prescribed at this time.  Behavioral modification strategies: increasing lean protein intake, decreasing simple carbohydrates, increasing vegetables, increasing water intake, no skipping meals, meal planning and cooking strategies, keeping healthy foods in the home, and  planning for success.  Caitlin Barrett has agreed to follow-up with our clinic in 4 weeks. She was informed of the importance of frequent follow-up visits to  maximize her success with intensive lifestyle modifications for her multiple health conditions.   Objective:   Blood pressure 138/83, pulse 80, height 5' 8 (1.727 m), weight 269 lb (122 kg), last menstrual period 05/26/2016, SpO2 99%. Body mass index is 40.9 kg/m.  General: Cooperative, alert, well developed, in no acute distress. HEENT: Conjunctivae and lids unremarkable. Cardiovascular: Regular rhythm.  Lungs: Normal work of breathing. Neurologic: No focal deficits.   Lab Results  Component Value Date   CREATININE 0.86 10/08/2023   BUN 17 10/08/2023   NA 139 10/08/2023   K 4.2 10/08/2023   CL 101 10/08/2023   CO2 21 10/08/2023   Lab Results  Component Value Date   ALT 12 10/08/2023   AST 11 10/08/2023   ALKPHOS 59 10/08/2023   BILITOT 0.2 10/08/2023   Lab Results  Component Value Date   HGBA1C 5.2 10/08/2023   HGBA1C 5.2 03/21/2023   HGBA1C 5.4 11/29/2022   HGBA1C 5.2 08/28/2021   HGBA1C 5.3 05/03/2021   Lab Results  Component Value Date   INSULIN  17.2 10/08/2023   INSULIN  9.3 03/21/2023   INSULIN  17.1 11/29/2022   INSULIN  10.7 08/28/2021   INSULIN  17.5 05/03/2021   Lab Results  Component Value Date   TSH 0.532 10/08/2023   Lab Results  Component Value Date   CHOL 230 (H) 10/08/2023   HDL 43 10/08/2023   LDLCALC 149 (H) 10/08/2023   TRIG 208 (H) 10/08/2023   CHOLHDL 5.3 (H) 10/08/2023   Lab Results  Component Value Date   VD25OH 30.2 10/08/2023   VD25OH 49.2 03/21/2023   VD25OH 70 01/10/2023   Lab Results  Component Value Date   WBC 10.5 02/14/2023   HGB 13.3 02/14/2023   HCT 39.6 02/14/2023   MCV 90.6 02/14/2023   PLT 401 (H) 02/14/2023   Lab Results  Component Value Date   IRON 64 08/13/2019   TIBC 333 08/13/2019   FERRITIN 30 08/13/2019   Attestation Statements:   Reviewed by clinician on day of visit: allergies, medications, problem list, medical history, surgical history, family history, social history, and previous encounter  notes.  I have reviewed the above documentation for accuracy and completeness, and I agree with the above. -  Neddie Steedman d. Rosaleen Mazer, NP-C "

## 2024-04-16 NOTE — Progress Notes (Signed)
 "  Office Visit Note  Patient: Caitlin Barrett             Date of Birth: 1970-08-01           MRN: [id number]             PCP: Edman Marsa PARAS, DO Referring: Edman Marsa PARAS, DO Visit Date: 04/30/2024 Occupation: Data Unavailable  Subjective:  Numbness in both hands  History of Present Illness: Caitlin Barrett is a 54 y.o. female with history of myofascial pain and osteoarthritis.  Patient was evaluated in the emergency department on 04/21/2024 after falling on ice.  Patient had a CT of the chest and cervical spine.  Patient states that she was diagnosed with a concussion.  Patient states that her symptoms have gradually started to subside but she continues to have some difficulty with multitasking.  Patient states that even prior to the fall she was experiencing intermittent numbness and tingling affecting both hands, right more severe than left.  Patient states that the numbness and tingling is most prominent in the right hand and seems to radiate from the right elbow.  Patient states that the symptoms are exacerbated while driving and with certain positions such as holding her phone.  She denies any muscle weakness or atrophy.  Patient states that these episodes have been occurring about twice weekly. Patient states that she has noticed some increased discomfort and stiffness in both knees.  She is aware that she has osteoarthritis in the left knee but is started to have more discomfort in the right knee.  She denies any joint swelling consistently.  She has been taking ibuprofen  as needed for pain relief.  She takes cymbalta  as prescribed.      Activities of Daily Living:  Patient reports morning stiffness for 30 minutes.   Patient Reports nocturnal pain.  Difficulty dressing/grooming: Reports Difficulty climbing stairs: Reports Difficulty getting out of chair: Reports Difficulty using hands for taps, buttons, cutlery, and/or writing: Denies  Review of Systems   Constitutional:  Positive for fatigue.  HENT:  Positive for mouth dryness. Negative for mouth sores.   Eyes:  Negative for dryness.  Respiratory:  Positive for shortness of breath.   Cardiovascular:  Negative for chest pain and palpitations.  Gastrointestinal:  Positive for constipation. Negative for blood in stool and diarrhea.  Endocrine: Negative for increased urination.  Genitourinary:  Negative for involuntary urination.  Musculoskeletal:  Positive for joint pain, joint pain, joint swelling, myalgias, muscle weakness, morning stiffness, muscle tenderness and myalgias. Negative for gait problem.  Skin:  Positive for rash and sensitivity to sunlight. Negative for color change and hair loss.  Allergic/Immunologic: Positive for susceptible to infections.  Neurological:  Positive for dizziness, numbness, headaches and parasthesias.  Hematological:  Negative for swollen glands.  Psychiatric/Behavioral:  Positive for depressed mood and sleep disturbance. The patient is nervous/anxious.     PMFS History:  Patient Active Problem List   Diagnosis Date Noted   Head injury 04/21/2024   BMI 39.0-39.9,adult-current bmi 39.4 06/20/2022   Morbid obesity (HCC)-start bmi 40.91 06/20/2022   Arthralgia of left knee 02/06/2022   Depression 08/11/2021   Bilateral lower extremity edema 08/06/2021   Status post nasal septoplasty 03/30/2021   Colon cancer screening    Polyp of colon    Depressed mood 12/01/2020   Nausea without vomiting 11/07/2020   Thyroid  disease 06/09/2020   Cervical strain 04/25/2020   Fatigue 04/04/2020   GAD (generalized anxiety disorder) 02/08/2020  Pericardial pain 01/28/2020   Muscle soreness 01/28/2020   Mixed hyperlipidemia 11/25/2019   Essential hypertension 11/25/2019   Polyphagia 10/08/2019   Vitamin D  deficiency 09/17/2019   Insulin  resistance 09/17/2019   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   Class 3 severe obesity with serious comorbidity and body mass index  (BMI) of 40.0 to 44.9 in adult [facility]) 09/17/2019   Exercise-induced asthma 04/22/2018   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  Takes allergy shots at [facility]   Anemia    Anxiety    Arthritis    Asthma    exercise induced--no inhalers   Back pain    Cancer (HCC) 2010   Basal cell skin cancer Lt eye, skin graft above Lt eye   Constipation    Costochondritis    Depression    Dysmenorrhea    Eczema    Edema, lower extremity    GERD (gastroesophageal reflux disease)    Headache    Hypothyroid    Nodules and Fine needle aspiration   IBS (irritable bowel syndrome)    Lab test positive for detection of COVID-19 virus    November 2020   Myalgia    Osteoarthritis    Painful menstrual periods    Palpitations    Plantar fasciitis    Shortness of breath    Sprain of carpal (joint) of wrist    Vitamin B 12 deficiency    Vitamin D  deficiency     Family History  Problem Relation Age of Onset   Diabetes Mother    Hyperlipidemia Mother    Hypertension Mother    Sleep apnea Mother    Obesity Mother    Cancer Father        lung CA   Heart disease Maternal Grandmother    Stroke Maternal Grandmother    Hypertension Maternal Grandmother    Diabetes Maternal Grandmother    Heart disease Maternal Grandfather    Diabetes Maternal Grandfather    Heart attack Maternal Grandfather    Breast cancer Neg Hx    Past Surgical History:  Procedure Laterality Date   ABDOMINAL HYSTERECTOMY  Bilateral 07/02/2016   Procedure: HYSTERECTOMY ABDOMINAL WITH BILATERAL SALPINGECTOMY-MIDLINE INCISION;  Surgeon: Gladis DELENA Dollar, MD;  Location: [facility] ORS;  Service: Gynecology;  Laterality: Bilateral;   CHOLECYSTECTOMY  2009   COLONOSCOPY WITH PROPOFOL  N/A 01/26/2021   Procedure: COLONOSCOPY WITH PROPOFOL ;  Surgeon: Unk Corinn Skiff, MD;  Location: [facility] END[facility];  Service: Gastroenterology;  Laterality: N/A;   fibromalga     HERNIA REPAIR  1974   umbilical   NASAL SEPTOPLASTY W/ TURBINOPLASTY Bilateral 03/03/2021   Procedure: NASAL SEPTOPLASTY WITH SUBMUCOSAL RESECTION OF TURBINATE;  Surgeon: Herminio Miu, MD;  Location: [facility];  Service: ENT;  Laterality: Bilateral;   SKIN GRAFT  2010   TONSILLECTOMY     Social History   Tobacco Use   Smoking status: Never    Passive exposure: Past   Smokeless tobacco: Never  Vaping Use   Vaping status: Never Used  Substance Use Topics   Alcohol use: Yes    Comment: rare   Drug use: No   Social History   Social History Narrative   Not on file     Immunization History  Administered Date(s) Administered   Influenza Inj Mdck Quad Pf 01/29/2019   Influenza Nasal 01/16/2018   Influenza-Unspecified 01/25/2020   Tdap 06/14/2017     Objective: Vital Signs: BP 135/84   Pulse 79   Temp 99 F (37.2 C)   Resp 15   Ht 5' 8.5 (1.74 m)   Wt 279 lb 3.2 oz (126.6 kg)   LMP 05/26/2016   BMI 41.83 kg/m    Physical Exam Vitals and nursing note reviewed.  Constitutional:      Appearance: She is well-developed.  HENT:     Head: Normocephalic and atraumatic.  Eyes:     Conjunctiva/sclera: Conjunctivae normal.  Cardiovascular:     Rate and Rhythm: Normal rate and regular rhythm.     Heart sounds: Normal heart sounds.  Pulmonary:     Effort: Pulmonary effort is normal.     Breath sounds: Normal breath sounds.  Abdominal:     General: Bowel sounds are normal.     Palpations: Abdomen is soft.  Musculoskeletal:      Cervical back: Normal range of motion.  Lymphadenopathy:     Cervical: No cervical adenopathy.  Skin:    General: Skin is warm and dry.     Capillary Refill: Capillary refill takes less than 2 seconds.  Neurological:     Mental Status: She is alert and oriented to person, place, and time.  Psychiatric:        Behavior: Behavior normal.      Musculoskeletal Exam: Generalized hyperalgesia and positive tender points.  C-spine, thoracic spine, lumbar spine have good range of motion.  No midline spinal tenderness.  No SI joint tenderness.  Shoulder joints, elbow joints, wrist joints, MCPs, PIPs, DIPs have good range of motion with no synovitis.  Complete fist formation bilaterally.  Hip joints have good range of motion with no groin pain.  Knee joints have good range of motion no warmth or effusion.  Ankle joints have good range of motion no tenderness or joint swelling.  No evidence of Achilles tendinitis or plantar fasciitis. +Phalen's test with right wrist flexion.   CDAI Exam: CDAI Score: -- Patient Global: --; Provider Global: -- Swollen: --; Tender: -- Joint Exam 04/30/2024   No joint exam has been documented for this visit   There is currently no information documented on the homunculus. Go to the Rheumatology activity and complete the homunculus joint exam.  Investigation: No additional findings.  Imaging: CT Cervical Spine Wo Contrast Result Date: 04/21/2024 EXAM: CT CERVICAL SPINE WITHOUT CONTRAST 04/21/2024 06:26:39 PM TECHNIQUE: CT of the cervical spine was performed without the administration of intravenous contrast. Multiplanar reformatted images are provided for review. Automated exposure control, iterative reconstruction, and/or weight based adjustment of the mA/kV was utilized to reduce the radiation dose to as low as reasonably achievable. COMPARISON: None available. CLINICAL HISTORY: Recent slip and fall with neck pain, initial encounter. FINDINGS: BONES AND ALIGNMENT: 7  cervical segments are well visualized. Vertebral body height is well maintained. No acute fracture or acute facet abnormality is seen. No traumatic malalignment. DEGENERATIVE CHANGES: No significant degenerative changes. SOFT TISSUES: Surrounding soft tissue structures are within normal limits. IMPRESSION: 1. No acute fracture or acute facet abnormality. Electronically signed by: Oneil Devonshire MD 04/21/2024 07:07 PM  EST RP Workstation: [id number]   CT Head Wo Contrast Result Date: 04/21/2024 EXAM: CT HEAD WITHOUT CONTRAST 04/21/2024 06:26:39 PM TECHNIQUE: CT of the head was performed without the administration of intravenous contrast. Automated exposure control, iterative reconstruction, and/or weight based adjustment of the mA/kV was utilized to reduce the radiation dose to as low as reasonably achievable. COMPARISON: MRI from 01/04/2015. CLINICAL HISTORY: Recent slip and fall with headaches, initial encounter. FINDINGS: BRAIN AND VENTRICLES: No acute hemorrhage. No evidence of acute infarct. No extra-axial collection. No mass effect or midline shift. Partially empty sella. Cerebellar tonsils at foramen magnum. This is stable from the prior MRI examination. ORBITS: No acute abnormality. SINUSES: No acute abnormality. SOFT TISSUES AND SKULL: No acute soft tissue abnormality. No skull fracture. IMPRESSION: 1. No acute intracranial abnormality. Electronically signed by: Oneil Devonshire MD 04/21/2024 07:05 PM EST RP Workstation: [id number]    Recent Labs: Lab Results  Component Value Date   WBC 10.5 02/14/2023   HGB 13.3 02/14/2023   PLT 401 (H) 02/14/2023   NA 139 10/08/2023   K 4.2 10/08/2023   CL 101 10/08/2023   CO2 21 10/08/2023   GLUCOSE 94 10/08/2023   BUN 17 10/08/2023   CREATININE 0.86 10/08/2023   BILITOT 0.2 10/08/2023   ALKPHOS 59 10/08/2023   AST 11 10/08/2023   ALT 12 10/08/2023   PROT 6.2 10/08/2023   ALBUMIN 4.2 10/08/2023   CALCIUM 9.3 10/08/2023   GFRAA 89 03/31/2020     Speciality Comments: No specialty comments available.  Procedures:  No procedures performed Allergies: Iodinated contrast media and Codeine   Assessment / Plan:     Visit Diagnoses: Myofascial pain - autoimmune work-up negative 04/28/2019: Patient continues to have intermittent myalgias and muscle tenderness due to myofascial pain.  Overall her symptoms have been manageable.  She remains on Cymbalta  60 mg 1 capsule daily and is taking Flexeril  10 mg at bedtime as needed for muscle spasms.  Discussed importance of regular exercise and good sleep hygiene.  She will follow-up in the office in 6 months or sooner if needed.  Trochanteric bursitis of both hips: Intermittent discomfort.  No groin pain currently.  Primary osteoarthritis of left knee: No warmth or effusion.  She has some difficulty rising from a seated position and climbing steps.  Trapezius muscle spasm: She has trapezius muscle tension and tenderness bilaterally.  Chronic SI joint pain: Intermittent discomfort.  Manageable.  Muscle fatigue: Patient experiences intermittent muscle fatigue.  Paresthesia of both hands: Patient presents today experiencing intermittent numbness and tingling affecting both hands, right more consistent than left.  She experiences the symptoms while driving and while scrolling on her phone at times.  She has not had any nocturnal symptoms.  Positive Phalen's test with right wrist flexion.  No thenar eminence muscle atrophy.  Complete fist formation bilaterally. Different options were discussed for further evaluation as well as management.  Patient plans on trying carpal tunnel splints at bedtime.  If her symptoms persist or worsen we can proceed with NCV with EMG for further evaluation.  Other medical conditions are listed as follows:   Vitamin D  deficiency  Basal cell carcinoma (BCC) of skin of other part of face  Anxiety with depression  Central hypothyroidism  Exercise-induced  asthma  History of IBS  Family history of psoriasis in sister  Orders: No orders of the defined types were placed in this encounter.  No orders of the defined types were placed in this encounter.    Follow-Up  Instructions: Return in about 6 months (around 10/28/2024).   Waddell CHRISTELLA Craze, PA-C  Note - This record has been created using Dragon software.  Chart creation errors have been sought, but may not always  have been located. Such creation errors do not reflect on  the standard of medical care. "

## 2024-04-18 ENCOUNTER — Other Ambulatory Visit: Payer: Self-pay | Admitting: Family Medicine

## 2024-04-18 DIAGNOSIS — I10 Essential (primary) hypertension: Secondary | ICD-10-CM

## 2024-04-20 NOTE — Telephone Encounter (Signed)
 Requested Prescriptions  Pending Prescriptions Disp Refills   amLODipine  (NORVASC ) 5 MG tablet [Pharmacy Med Name: AMLODIPINE  BESYLATE 5 MG TAB] 90 tablet 0    Sig: TAKE 1 TABLET (5 MG TOTAL) BY MOUTH DAILY.     Cardiovascular: Calcium Channel Blockers 2 Passed - 04/20/2024 11:24 AM      Passed - Last BP in normal range    BP Readings from Last 1 Encounters:  03/31/24 138/83         Passed - Last Heart Rate in normal range    Pulse Readings from Last 1 Encounters:  03/31/24 80         Passed - Valid encounter within last 6 months    Recent Outpatient Visits           4 months ago Annual physical exam   [place] [facility], Marsa PARAS, DO   7 months ago Essential hypertension   [place] [facility], Marsa PARAS, DO       Future Appointments             In 1 week Cheryl Waddell CHRISTELLA RIGGERS [facility] - A Dept Of [place]. [facility]

## 2024-04-21 ENCOUNTER — Emergency Department

## 2024-04-21 ENCOUNTER — Ambulatory Visit: Admission: EM | Admit: 2024-04-21 | Discharge: 2024-04-21 | Disposition: A | Discharge status: Home / Self Care

## 2024-04-21 ENCOUNTER — Emergency Department
Admission: EM | Admit: 2024-04-21 | Discharge: 2024-04-21 | Disposition: A | Discharge status: Home / Self Care | Attending: Emergency Medicine | Admitting: Emergency Medicine

## 2024-04-21 ENCOUNTER — Other Ambulatory Visit: Payer: Self-pay

## 2024-04-21 DIAGNOSIS — S0990XA Unspecified injury of head, initial encounter: Secondary | ICD-10-CM | POA: Diagnosis not present

## 2024-04-21 DIAGNOSIS — W19XXXA Unspecified fall, initial encounter: Secondary | ICD-10-CM

## 2024-04-21 DIAGNOSIS — W01198A Fall on same level from slipping, tripping and stumbling with subsequent striking against other object, initial encounter: Secondary | ICD-10-CM | POA: Insufficient documentation

## 2024-04-21 MED ORDER — KETOROLAC TROMETHAMINE 30 MG/ML IJ SOLN
30.0000 mg | Freq: Once | INTRAMUSCULAR | Status: AC
  Administered 2024-04-21: 30 mg via INTRAMUSCULAR
  Filled 2024-04-21: qty 1

## 2024-04-21 MED ORDER — ONDANSETRON 4 MG PO TBDP
4.0000 mg | ORAL_TABLET | Freq: Once | ORAL | Status: AC
  Administered 2024-04-21: 4 mg via ORAL
  Filled 2024-04-21: qty 1

## 2024-04-21 MED ORDER — ONDANSETRON 4 MG PO TBDP: 4.0000 mg | ORAL_TABLET | Freq: Three times a day (TID) | ORAL | 0 refills | Status: AC | PRN

## 2024-04-21 MED ORDER — ACETAMINOPHEN 325 MG PO TABS
650.0000 mg | ORAL_TABLET | Freq: Once | ORAL | Status: AC
  Administered 2024-04-21: 650 mg via ORAL
  Filled 2024-04-21: qty 2

## 2024-04-21 NOTE — Discharge Instructions (Addendum)
 You were evaluated in the ED for a headache and/or head injury. Your evaluation did not reveal signs of a serious condition such as a brain bleed or skull fracture and were reassuring. Your CT head and cervical spine is normal   Consider these rest and recovery strategies at home:  - Avoid strenous activity , exercise or heavy lifting for a few days.  -Limit screen time and activities requiring intense focus.  - Gradually return to normal activities as symptoms improve    Pain control:  Acetaminophen  (tylenol ) - You can take 2 extra strength tablets (1000 mg) every 6 hours as needed for pain/fever.  Make sure you eat food/drink water when taking these medications.  Apply ice/cold pack to the affected area 10-20 minutes every 2-3 hours for the first 24-48 hours to reduce pain and swelling. Avoid alcohol and caffeine.   Follow up with you PCP in 1-2 weeks if symptoms persist or worsen.

## 2024-04-21 NOTE — ED Triage Notes (Signed)
 Pt sts that she fell today and hit her head on the ice. Pt sts that she is having nausea.

## 2024-04-21 NOTE — ED Provider Notes (Signed)
 " [facility]    CSN: [id number] Arrival date & time: 04/21/24  1428      History   Chief Complaint Chief Complaint  Patient presents with   Head Injury   Fall    HPI Caitlin Barrett is a 54 y.o. female.   54 year old female, Caitlin Barrett, presents to urgent care for evaluation after falling on ice and striking back of her head ~1300 today, patient reports she did have some slurred speech after impact initially, pt unsure of LOC,reports+ Headache, nausea, and dizziness since, no meds taken prior to arrival. GCS 15 in office.  The history is provided by the patient. No language interpreter was used.  Head Injury Associated symptoms: headache and nausea   Fall Associated symptoms include headaches.    Past Medical History:  Diagnosis Date   Allergy    seasonal.  Takes allergy shots at [facility]   Anemia    Anxiety    Arthritis    Asthma    exercise induced--no inhalers   Back pain    Cancer (HCC) 2010   Basal cell skin cancer Lt eye, skin graft above Lt eye   Constipation    Costochondritis    Depression    Dysmenorrhea    Eczema    Edema, lower extremity    GERD (gastroesophageal reflux disease)    Headache    Hypothyroid    Nodules and Fine needle aspiration   IBS (irritable bowel syndrome)    Lab test positive for detection of COVID-19 virus    November 2020   Myalgia    Osteoarthritis    Painful menstrual periods    Palpitations    Plantar fasciitis    Shortness of breath    Sprain of carpal (joint) of wrist    Vitamin B 12 deficiency    Vitamin D  deficiency     Patient Active Problem List   Diagnosis Date Noted   Head injury 04/21/2024   BMI 39.0-39.9,adult-current bmi 39.4 06/20/2022   Morbid obesity (HCC)-start bmi 40.91 06/20/2022   Arthralgia of left knee 02/06/2022   Depression 08/11/2021   Bilateral lower extremity edema 08/06/2021   Status post nasal septoplasty 03/30/2021   Colon cancer screening    Polyp of colon     Depressed mood 12/01/2020   Nausea without vomiting 11/07/2020   Thyroid  disease 06/09/2020   Cervical strain 04/25/2020   Fatigue 04/04/2020   GAD (generalized anxiety disorder) 02/08/2020   Pericardial pain 01/28/2020   Muscle soreness 01/28/2020   Mixed hyperlipidemia 11/25/2019   Essential hypertension 11/25/2019   Polyphagia 10/08/2019   Vitamin D  deficiency 09/17/2019   Insulin  resistance 09/17/2019   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   Class 3 severe obesity with serious comorbidity and body mass index (BMI) of 40.0 to 44.9 in adult [facility]) 09/17/2019   Exercise-induced asthma 04/22/2018   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   Monilial vaginitis 02/26/2017   Decreased libido 02/26/2017   Major depressive disorder, recurrent, in full remission 02/26/2017   Dyspareunia, female 02/26/2017   S/P TAH (total abdominal hysterectomy) 07/02/2016   Nevus of abdominal wall 06/11/2016   Morton's neuralgia 07/21/2015   Nocturia 07/21/2015   Stress incontinence 07/21/2015   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   Allergic rhinitis 06/21/2015   Obesity (BMI 30-39.9) 06/21/2015    Past Surgical History:  Procedure Laterality Date   ABDOMINAL HYSTERECTOMY Bilateral 07/02/2016   Procedure: HYSTERECTOMY ABDOMINAL WITH BILATERAL SALPINGECTOMY-MIDLINE INCISION;  Surgeon: Gladis DELENA Dollar, MD;  Location:  [facility] OR[facility];  Service: Gynecology;  Laterality: Bilateral;   CHOLECYSTECTOMY  2009   COLONOSCOPY WITH PROPOFOL  N/A 01/26/2021   Procedure: COLONOSCOPY WITH PROPOFOL ;  Surgeon: Unk Corinn Skiff, MD;  Location: [facility]COPY;  Service: Gastroenterology;  Laterality: N/A;   fibromalga     HERNIA REPAIR  1974   umbilical   NASAL SEPTOPLASTY W/ TURBINOPLASTY Bilateral 03/03/2021   Procedure: NASAL SEPTOPLASTY WITH SUBMUCOSAL RESECTION OF TURBINATE;  Surgeon: Herminio Miu, MD;  Location: [facility];  Service: ENT;  Laterality: Bilateral;   SKIN GRAFT  2010   TONSILLECTOMY      OB  History     Gravida  0   Para  0   Term  0   Preterm  0   AB  0   Living  0      SAB  0   IAB  0   Ectopic  0   Multiple  0   Live Births  0            Home Medications    Prior to Admission medications  Medication Sig Start Date End Date Taking? Authorizing Provider  albuterol  (VENTOLIN  HFA) 108 (90 Base) MCG/ACT inhaler Inhale 2 puffs into the lungs every 4 (four) hours as needed for wheezing or shortness of breath. 06/04/23   White, Shelba SAUNDERS, NP  amLODipine  (NORVASC ) 5 MG tablet TAKE 1 TABLET (5 MG TOTAL) BY MOUTH DAILY. 04/20/24 04/20/25  Karamalegos, Marsa PARAS, DO  clobetasol  cream (TEMOVATE ) 0.05 % Apply 1 Application topically 2 (two) times daily. For up to 1-2 weeks then as needed. 06/12/23   Brimage, Vondra, DO  COLLAGEN-BORON-HYALURONIC ACID PO Take by mouth.    [provider]  cyanocobalamin  (VITAMIN B12) 1000 MCG/ML injection INJECT 1 ML (1,000 MCG TOTAL) INTO THE MUSCLE ONCE A WEEK. FOR 4 WEEKS 01/13/24   Karamalegos, Marsa PARAS, DO  cyclobenzaprine  (FLEXERIL ) 10 MG tablet Take 1 tablet (10 mg total) by mouth at bedtime as needed for muscle spasms. 08/25/22   Brimage, Vondra, DO  DULoxetine  (CYMBALTA ) 60 MG capsule Take 1 capsule (60 mg total) by mouth daily. 03/31/24   Danford, Rockie D, NP  EPINEPHrine  0.3 mg/0.3 mL IJ SOAJ injection 0.3 mg as needed. 09/25/08   [provider]  estradiol  (ESTRACE ) 1 MG tablet Take 1 tablet (1 mg total) by mouth daily. 09/16/23   Starla Harland BROCKS, MD  fluticasone  (FLONASE ) 50 MCG/ACT nasal spray Place into both nostrils as needed. 05/17/21   [provider]  loratadine (CLARITIN) 10 MG tablet Take 10 mg by mouth daily as needed. 10/13/20   [provider]  Misc Natural Products (OSTEO BI-FLEX ADV TRIPLE ST PO) Take by mouth.    [provider]  Multiple Vitamins-Minerals (MULTIVITAMIN WOMEN PO) Take by mouth.    [provider]  omeprazole (PRILOSEC) 40 MG capsule Take 40 mg by  mouth as needed.    [provider]  Probiotic Product (TRUBIOTICS) CAPS Take 1 capsule by mouth daily.    [provider]  rizatriptan  (MAXALT -MLT) 10 MG disintegrating tablet TAKE 1 TABLET BY MOUTH AS NEEDED FOR MIGRAINE. MAY REPEAT IN 2 HOURS IF NEEDED. MAX DOSE IS 2 TABS IN 24 HOURS. 02/18/24   Karamalegos, Marsa PARAS, DO  tirzepatide  5 MG/0.5ML injection vial Inject 5 mg into the skin. 12/23/23 12/22/24  [provider]  traZODone  (DESYREL ) 50 MG tablet Take 0.5-1 tablets (25-50 mg total) by mouth at bedtime. 03/31/24   Jonel Rockie  D, NP  TURMERIC PO Take by mouth daily.    [provider]  VITAMIN D  PO Take 1,000 Units by mouth. 6 days weekly    [provider]  Vitamin D , Ergocalciferol , (DRISDOL ) 1.25 MG (50000 UNIT) CAPS capsule TAKE ONE CAPSULE BY MOUTH EVERY 14 DAYS 03/31/24   Danford, Rockie BIRCH, NP    Family History Family History  Problem Relation Age of Onset   Diabetes Mother    Hyperlipidemia Mother    Hypertension Mother    Sleep apnea Mother    Obesity Mother    Cancer Father        lung CA   Heart disease Maternal Grandmother    Stroke Maternal Grandmother    Hypertension Maternal Grandmother    Diabetes Maternal Grandmother    Heart disease Maternal Grandfather    Diabetes Maternal Grandfather    Heart attack Maternal Grandfather    Breast cancer Neg Hx     Social History Social History[1]   Allergies   Iodinated contrast media and Codeine   Review of Systems Review of Systems  Eyes:  Negative for photophobia and visual disturbance.  Gastrointestinal:  Positive for nausea.  Neurological:  Positive for dizziness and headaches.  All other systems reviewed and are negative.    Physical Exam Triage Vital Signs ED Triage Vitals  Encounter Vitals Group     BP 04/21/24 1446 (!) 142/84     Girls Systolic BP Percentile --      Girls Diastolic BP Percentile --      Boys Systolic BP Percentile --      Boys  Diastolic BP Percentile --      Pulse Rate 04/21/24 1446 85     Resp 04/21/24 1446 18     Temp 04/21/24 1446 98.5 F (36.9 C)     Temp Source 04/21/24 1446 Oral     SpO2 04/21/24 1446 96 %     Weight 04/21/24 1444 278 lb 6.4 oz (126.3 kg)     Height --      Head Circumference --      Peak Flow --      Pain Score 04/21/24 1446 6     Pain Loc --      Pain Education --      Exclude from Growth Chart --    No data found.  Updated Vital Signs BP (!) 142/84 (BP Location: Left Arm)   Pulse 85   Temp 98.5 F (36.9 C) (Oral)   Resp 18   Wt 278 lb 6.4 oz (126.3 kg)   LMP 05/26/2016   SpO2 96%   BMI 42.33 kg/m   Visual Acuity Right Eye Distance:   Left Eye Distance:   Bilateral Distance:    Right Eye Near:   Left Eye Near:    Bilateral Near:     Physical Exam Vitals and nursing note reviewed.  Constitutional:      General: She is not in acute distress.    Appearance: She is well-developed and well-groomed.  HENT:     Head:      Right Ear: No hemotympanum.     Left Ear: No hemotympanum.  Eyes:     General: Vision grossly intact.     Extraocular Movements:     Right eye: No nystagmus.     Left eye: No nystagmus.     Conjunctiva/sclera: Conjunctivae normal.     Pupils: Pupils are equal, round, and reactive to light.  Cardiovascular:  Rate and Rhythm: Normal rate and regular rhythm.     Heart sounds: Normal heart sounds. No murmur heard. Pulmonary:     Effort: Pulmonary effort is normal. No respiratory distress.     Breath sounds: Normal breath sounds and air entry.  Abdominal:     Palpations: Abdomen is soft.     Tenderness: There is no abdominal tenderness.  Musculoskeletal:        General: No swelling.     Cervical back: Neck supple.  Skin:    General: Skin is warm and dry.     Capillary Refill: Capillary refill takes less than 2 seconds.  Neurological:     General: No focal deficit present.     Mental Status: She is alert and oriented to person, place,  and time.     GCS: GCS eye subscore is 4. GCS verbal subscore is 5. GCS motor subscore is 6.     Comments: MAEW x 4 , handgrips equal bilateral  Psychiatric:        Attention and Perception: Attention normal.        Mood and Affect: Mood normal.        Speech: Speech normal.        Behavior: Behavior normal. Behavior is cooperative.        Thought Content: Thought content normal.      UC Treatments / Results  Labs (all labs ordered are listed, but only abnormal results are displayed) Labs Reviewed - No data to display  EKG   Radiology No results found.  Procedures Procedures (including critical care time)  Medications Ordered in UC Medications - No data to display  Initial Impression / Assessment and Plan / UC Course  I have reviewed the triage vital signs and the nursing notes.  Pertinent labs & imaging results that were available during my care of the patient were reviewed by me and considered in my medical decision making (see chart for details).    Discussed exam findings and plan of care with patient , will go POV to ER /has driver : Go to the Er for further evaluation of head injury, nausea,dizziness, unknown LOC(head,c spine CT) Do not eat or drink anything before being evaluated by provider.  Patient verbalized understanding to this provider  Ddx: Closed head injury w unknown LOC, concussion Final Clinical Impressions(s) / UC Diagnoses   Final diagnoses:  Injury of head, initial encounter     Discharge Instructions      Go to the Er for further evaluation of head injury, nausea,dizziness, unknown LOC Do not eat or drink anything before being evaluated by provider    ED Prescriptions   None    PDMP not reviewed this encounter.    [1]  Social History Tobacco Use   Smoking status: Never    Passive exposure: Past   Smokeless tobacco: Never  Vaping Use   Vaping status: Never Used  Substance Use Topics   Alcohol use: Yes    Comment: rare    Drug use: No     Dyana Magner, Rilla, NP 04/21/24 1815  "

## 2024-04-21 NOTE — ED Triage Notes (Signed)
 Pt c/o head injury d/t fall today. States she slipped on ice today & hit the back of her head. States did have some slurred speech right after impact. Unsure of LOC. Currently having HA,nausea & dizziness. No OTC meds.

## 2024-04-21 NOTE — ED Provider Notes (Signed)
 "   [facility] Emergency Department Provider Note     Event Date/Time   First MD Initiated Contact with Patient 04/21/24 1756     (approximate)   History   Fall   HPI  Caitlin Barrett is a 54 y.o. female presents to the ED after being evaluated at [facility] for falling on ice and striking the back of her head.  Patient reports having slurred speech immediately after impact.  Unknown LOC.  Patient reports her neighbor was able to help her up.  She is complaining of headache, nausea and dizziness.       Physical Exam   Triage Vital Signs: ED Triage Vitals  Encounter Vitals Group     BP 04/21/24 1608 (!) 162/106     Girls Systolic BP Percentile --      Girls Diastolic BP Percentile --      Boys Systolic BP Percentile --      Boys Diastolic BP Percentile --      Pulse Rate 04/21/24 1608 93     Resp 04/21/24 1608 17     Temp 04/21/24 1608 98.1 F (36.7 C)     Temp Source 04/21/24 1608 Oral     SpO2 04/21/24 1608 97 %     Weight 04/21/24 1609 270 lb (122.5 kg)     Height 04/21/24 1609 5' 8 (1.727 m)     Head Circumference --      Peak Flow --      Pain Score 04/21/24 1609 6     Pain Loc --      Pain Education --      Exclude from Growth Chart --     Most recent vital signs: Vitals:   04/21/24 1608  BP: (!) 162/106  Pulse: 93  Resp: 17  Temp: 98.1 F (36.7 C)  SpO2: 97%   General: Well appearing and comfortable. Alert and oriented. INAD.    Head:  NCAT. No scalp hematoma or lesions noted. Eyes:  PERRLA. EOMI.  Neck:   No cervical spine tenderness to palpation. Full ROM without difficulty.  CV:  Good peripheral perfusion. RRR.  RESP:  Normal effort. LCTAB.  NEURO: GCS 15.  Cranial nerves II-XII intact. No focal deficits. Speech clear. Sensation and motor function intact. Normal muscle strength of UE & LE. Gait is steady.   ED Results / Procedures / Treatments   Labs (all labs ordered are listed, but only abnormal results are  displayed) Labs Reviewed - No data to display  RADIOLOGY  I personally viewed and evaluated these images as part of my medical decision making, as well as reviewing the written report by the radiologist.  ED Provider Interpretation: Normal head CT and cervical spine CT  CT Cervical Spine Wo Contrast Result Date: 04/21/2024 EXAM: CT CERVICAL SPINE WITHOUT CONTRAST 04/21/2024 06:26:39 PM TECHNIQUE: CT of the cervical spine was performed without the administration of intravenous contrast. Multiplanar reformatted images are provided for review. Automated exposure control, iterative reconstruction, and/or weight based adjustment of the mA/kV was utilized to reduce the radiation dose to as low as reasonably achievable. COMPARISON: None available. CLINICAL HISTORY: Recent slip and fall with neck pain, initial encounter. FINDINGS: BONES AND ALIGNMENT: 7 cervical segments are well visualized. Vertebral body height is well maintained. No acute fracture or acute facet abnormality is seen. No traumatic malalignment. DEGENERATIVE CHANGES: No significant degenerative changes. SOFT TISSUES: Surrounding soft tissue structures are within normal limits. IMPRESSION: 1. No acute fracture or  acute facet abnormality. Electronically signed by: Oneil Devonshire MD 04/21/2024 07:07 PM EST RP Workstation: [id number]   CT Head Wo Contrast Result Date: 04/21/2024 EXAM: CT HEAD WITHOUT CONTRAST 04/21/2024 06:26:39 PM TECHNIQUE: CT of the head was performed without the administration of intravenous contrast. Automated exposure control, iterative reconstruction, and/or weight based adjustment of the mA/kV was utilized to reduce the radiation dose to as low as reasonably achievable. COMPARISON: MRI from 01/04/2015. CLINICAL HISTORY: Recent slip and fall with headaches, initial encounter. FINDINGS: BRAIN AND VENTRICLES: No acute hemorrhage. No evidence of acute infarct. No extra-axial collection. No mass effect or midline shift. Partially  empty sella. Cerebellar tonsils at foramen magnum. This is stable from the prior MRI examination. ORBITS: No acute abnormality. SINUSES: No acute abnormality. SOFT TISSUES AND SKULL: No acute soft tissue abnormality. No skull fracture. IMPRESSION: 1. No acute intracranial abnormality. Electronically signed by: Oneil Devonshire MD 04/21/2024 07:05 PM EST RP Workstation: [id number]    PROCEDURES:  Critical Care performed: No  Procedures   MEDICATIONS ORDERED IN ED: Medications  acetaminophen  (TYLENOL ) tablet 650 mg (650 mg Oral Given 04/21/24 1840)  ketorolac  (TORADOL ) 30 MG/ML injection 30 mg (30 mg Intramuscular Given 04/21/24 1840)  ondansetron  (ZOFRAN -ODT) disintegrating tablet 4 mg (4 mg Oral Given 04/21/24 1840)     IMPRESSION / MDM / ASSESSMENT AND PLAN / ED COURSE  I reviewed the triage vital signs and the nursing notes.                               54 y.o. female presents to the emergency department for evaluation and treatment of mechanical fall striking posterior head. See HPI for further details.   Differential diagnosis includes, but is not limited to  closed head injury, subdural/epidural hematoma, concussion, considered but less likely subarachnoid hemorrhage   Patient's presentation is most consistent with acute complicated illness / injury requiring diagnostic workup.  Patient is alert and oriented.  She is hemodynamically stable.  Noted initial elevated BP of 162/106, otherwise vital signs within normal limits.  Physical exam findings are stated above.  Normal neuroexam.  No red flag signs.  GCS is 15.  Given history will obtain head CT and cervical spine CT to rule out trauma and ICH.  CT head and cervical spine are reassuring.  Patient administered IM Toradol  and Zofran  with some improvement in symptoms.  She is in stable condition for discharge home.  We discussed concussion protocols.  Strict ED return precautions were discussed.  Patient is advised to follow-up with  primary care provider in 1 week.    FINAL CLINICAL IMPRESSION(S) / ED DIAGNOSES   Final diagnoses:  Fall, initial encounter  Injury of head, initial encounter   Rx / DC Orders   ED Discharge Orders          Ordered    ondansetron  (ZOFRAN -ODT) 4 MG disintegrating tablet  Every 8 hours PRN        04/21/24 1932             Note:  This document was prepared using Dragon voice recognition software and may include unintentional dictation errors.    Margrette Monte A, PA-C 04/21/24 1940  "

## 2024-04-21 NOTE — Discharge Instructions (Addendum)
 Go to the Er for further evaluation of head injury, nausea,dizziness, unknown LOC Do not eat or drink anything before being evaluated by provider

## 2024-04-21 NOTE — ED Notes (Signed)
 See triage note  Presents with headache and nausea  States she slipped on ice  Mize backwards

## 2024-04-28 ENCOUNTER — Ambulatory Visit (INDEPENDENT_AMBULATORY_CARE_PROVIDER_SITE_OTHER): Admitting: Adult Health

## 2024-04-28 ENCOUNTER — Encounter (INDEPENDENT_AMBULATORY_CARE_PROVIDER_SITE_OTHER): Payer: Self-pay | Admitting: Adult Health

## 2024-04-28 VITALS — BP 131/78 | HR 81 | Temp 98.1°F | Ht 68.0 in | Wt 277.0 lb

## 2024-04-28 DIAGNOSIS — E88819 Insulin resistance, unspecified: Secondary | ICD-10-CM

## 2024-04-28 DIAGNOSIS — F329 Major depressive disorder, single episode, unspecified: Secondary | ICD-10-CM | POA: Diagnosis not present

## 2024-04-28 DIAGNOSIS — G4709 Other insomnia: Secondary | ICD-10-CM | POA: Diagnosis not present

## 2024-04-28 DIAGNOSIS — E559 Vitamin D deficiency, unspecified: Secondary | ICD-10-CM | POA: Diagnosis not present

## 2024-04-28 DIAGNOSIS — Z6841 Body Mass Index (BMI) 40.0 and over, adult: Secondary | ICD-10-CM | POA: Diagnosis not present

## 2024-04-28 DIAGNOSIS — Z9181 History of falling: Secondary | ICD-10-CM

## 2024-04-28 MED ORDER — DULOXETINE HCL 60 MG PO CPEP: 60.0000 mg | ORAL_CAPSULE | Freq: Every day | ORAL | 0 refills | Status: AC

## 2024-04-28 MED ORDER — TRAZODONE HCL 50 MG PO TABS: 25.0000 mg | ORAL_TABLET | Freq: Every day | ORAL | 0 refills | Status: AC

## 2024-04-28 MED ORDER — VITAMIN D (ERGOCALCIFEROL) 1.25 MG (50000 UNIT) PO CAPS: ORAL_CAPSULE | ORAL | 0 refills | Status: AC

## 2024-04-30 ENCOUNTER — Encounter: Payer: Self-pay | Admitting: Physician Assistant

## 2024-04-30 ENCOUNTER — Ambulatory Visit: Admitting: Physician Assistant

## 2024-04-30 VITALS — BP 135/84 | HR 79 | Temp 99.0°F | Resp 15 | Ht 68.5 in | Wt 279.2 lb

## 2024-04-30 DIAGNOSIS — R202 Paresthesia of skin: Secondary | ICD-10-CM

## 2024-04-30 DIAGNOSIS — M545 Low back pain, unspecified: Secondary | ICD-10-CM | POA: Diagnosis not present

## 2024-04-30 DIAGNOSIS — Z8719 Personal history of other diseases of the digestive system: Secondary | ICD-10-CM | POA: Diagnosis not present

## 2024-04-30 DIAGNOSIS — M7918 Myalgia, other site: Secondary | ICD-10-CM

## 2024-04-30 DIAGNOSIS — M6289 Other specified disorders of muscle: Secondary | ICD-10-CM

## 2024-04-30 DIAGNOSIS — C44319 Basal cell carcinoma of skin of other parts of face: Secondary | ICD-10-CM

## 2024-04-30 DIAGNOSIS — J45990 Exercise induced bronchospasm: Secondary | ICD-10-CM

## 2024-04-30 DIAGNOSIS — M7061 Trochanteric bursitis, right hip: Secondary | ICD-10-CM

## 2024-04-30 DIAGNOSIS — M533 Sacrococcygeal disorders, not elsewhere classified: Secondary | ICD-10-CM

## 2024-04-30 DIAGNOSIS — E038 Other specified hypothyroidism: Secondary | ICD-10-CM | POA: Diagnosis not present

## 2024-04-30 DIAGNOSIS — Z840 Family history of diseases of the skin and subcutaneous tissue: Secondary | ICD-10-CM

## 2024-04-30 DIAGNOSIS — M1712 Unilateral primary osteoarthritis, left knee: Secondary | ICD-10-CM

## 2024-04-30 DIAGNOSIS — G8929 Other chronic pain: Secondary | ICD-10-CM

## 2024-04-30 DIAGNOSIS — F418 Other specified anxiety disorders: Secondary | ICD-10-CM

## 2024-04-30 DIAGNOSIS — E559 Vitamin D deficiency, unspecified: Secondary | ICD-10-CM | POA: Diagnosis not present

## 2024-04-30 DIAGNOSIS — M7062 Trochanteric bursitis, left hip: Secondary | ICD-10-CM

## 2024-04-30 DIAGNOSIS — M62838 Other muscle spasm: Secondary | ICD-10-CM

## 2024-05-05 ENCOUNTER — Inpatient Hospital Stay: Admitting: Family Medicine

## 2024-06-01 ENCOUNTER — Ambulatory Visit (INDEPENDENT_AMBULATORY_CARE_PROVIDER_SITE_OTHER): Admitting: Nurse Practitioner

## 2024-06-17 ENCOUNTER — Ambulatory Visit: Admitting: Family Medicine

## 2024-10-29 ENCOUNTER — Ambulatory Visit: Admitting: Rheumatology
